# Patient Record
Sex: Male | Born: 1940 | Race: Black or African American | Hispanic: No | Smoking: Never smoker
Health system: Southern US, Community
[De-identification: ages and names within clinical notes are randomized; demographics above are authoritative.]

## PROBLEM LIST (undated history)

## (undated) DIAGNOSIS — I4819 Other persistent atrial fibrillation: Secondary | ICD-10-CM

## (undated) DIAGNOSIS — I5022 Chronic systolic (congestive) heart failure: Secondary | ICD-10-CM

## (undated) DIAGNOSIS — I480 Paroxysmal atrial fibrillation: Secondary | ICD-10-CM

## (undated) DIAGNOSIS — Z9289 Personal history of other medical treatment: Secondary | ICD-10-CM

## (undated) DIAGNOSIS — E079 Disorder of thyroid, unspecified: Secondary | ICD-10-CM

## (undated) DIAGNOSIS — I5031 Acute diastolic (congestive) heart failure: Secondary | ICD-10-CM

## (undated) DIAGNOSIS — I5021 Acute systolic (congestive) heart failure: Secondary | ICD-10-CM

## (undated) DIAGNOSIS — C679 Malignant neoplasm of bladder, unspecified: Secondary | ICD-10-CM

## (undated) DIAGNOSIS — R7989 Other specified abnormal findings of blood chemistry: Secondary | ICD-10-CM

## (undated) DIAGNOSIS — I429 Cardiomyopathy, unspecified: Secondary | ICD-10-CM

## (undated) DIAGNOSIS — I428 Other cardiomyopathies: Secondary | ICD-10-CM

## (undated) DIAGNOSIS — G43909 Migraine, unspecified, not intractable, without status migrainosus: Secondary | ICD-10-CM

## (undated) DIAGNOSIS — E039 Hypothyroidism, unspecified: Secondary | ICD-10-CM

## (undated) HISTORY — DX: Paroxysmal atrial fibrillation: I48.0

## (undated) HISTORY — DX: Acute systolic (congestive) heart failure: I50.21

## (undated) HISTORY — DX: Cardiomyopathy, unspecified: I42.9

## (undated) HISTORY — DX: Other persistent atrial fibrillation: I48.19

## (undated) HISTORY — DX: Other cardiomyopathies: I42.8

## (undated) HISTORY — DX: Acute diastolic (congestive) heart failure: I50.31

## (undated) HISTORY — PX: TONSILLECTOMY: SUR1361

## (undated) HISTORY — DX: Other specified abnormal findings of blood chemistry: R79.89

## (undated) HISTORY — DX: Chronic systolic (congestive) heart failure: I50.22

---

## 2005-04-02 ENCOUNTER — Ambulatory Visit: Payer: Self-pay | Admitting: Oncology

## 2005-04-30 ENCOUNTER — Ambulatory Visit (HOSPITAL_COMMUNITY): Admission: RE | Admit: 2005-04-30 | Discharge: 2005-04-30 | Payer: Self-pay | Admitting: Oncology

## 2005-05-28 ENCOUNTER — Emergency Department (HOSPITAL_COMMUNITY): Admission: EM | Admit: 2005-05-28 | Discharge: 2005-05-28 | Payer: Self-pay | Admitting: Emergency Medicine

## 2005-05-29 ENCOUNTER — Ambulatory Visit: Payer: Self-pay | Admitting: Oncology

## 2005-09-05 ENCOUNTER — Ambulatory Visit: Payer: Self-pay | Admitting: Oncology

## 2005-10-31 ENCOUNTER — Ambulatory Visit: Payer: Self-pay | Admitting: Oncology

## 2005-12-19 ENCOUNTER — Ambulatory Visit: Payer: Self-pay | Admitting: Oncology

## 2005-12-19 LAB — CBC WITH DIFFERENTIAL/PLATELET
BASO%: 0.7 % (ref 0.0–2.0)
Basophils Absolute: 0 10e3/uL (ref 0.0–0.1)
EOS%: 4.6 % (ref 0.0–7.0)
Eosinophils Absolute: 0.3 10e3/uL (ref 0.0–0.5)
HCT: 37.2 % — ABNORMAL LOW (ref 38.7–49.9)
HGB: 13 g/dL (ref 13.0–17.1)
LYMPH%: 29.1 % (ref 14.0–48.0)
MCH: 34.4 pg — ABNORMAL HIGH (ref 28.0–33.4)
MCHC: 35 g/dL (ref 32.0–35.9)
MCV: 98.1 fL — ABNORMAL HIGH (ref 81.6–98.0)
MONO#: 0.9 10e3/uL (ref 0.1–0.9)
MONO%: 14.8 % — ABNORMAL HIGH (ref 0.0–13.0)
NEUT#: 3.1 10e3/uL (ref 1.5–6.5)
NEUT%: 50.8 % (ref 40.0–75.0)
Platelets: 74 10e3/uL — ABNORMAL LOW (ref 145–400)
RBC: 3.79 10e6/uL — ABNORMAL LOW (ref 4.20–5.71)
RDW: 14.5 % (ref 11.2–14.6)
WBC: 6 10e3/uL (ref 4.0–10.0)
lymph#: 1.8 10e3/uL (ref 0.9–3.3)

## 2006-03-20 ENCOUNTER — Ambulatory Visit: Payer: Self-pay | Admitting: Oncology

## 2006-03-21 LAB — CBC WITH DIFFERENTIAL/PLATELET
BASO%: 0.5 % (ref 0.0–2.0)
Basophils Absolute: 0 10*3/uL (ref 0.0–0.1)
Eosinophils Absolute: 0.3 10*3/uL (ref 0.0–0.5)
HCT: 38.3 % — ABNORMAL LOW (ref 38.7–49.9)
HGB: 13.3 g/dL (ref 13.0–17.1)
LYMPH%: 27.9 % (ref 14.0–48.0)
MCH: 34.7 pg — ABNORMAL HIGH (ref 28.0–33.4)
MCHC: 34.7 g/dL (ref 32.0–35.9)
MCV: 100 fL — ABNORMAL HIGH (ref 81.6–98.0)
MONO%: 15.7 % — ABNORMAL HIGH (ref 0.0–13.0)
NEUT#: 2.8 10*3/uL (ref 1.5–6.5)
NEUT%: 49.8 % (ref 40.0–75.0)
Platelets: 77 10*3/uL — ABNORMAL LOW (ref 145–400)
lymph#: 1.6 10*3/uL (ref 0.9–3.3)

## 2006-03-21 LAB — COMPREHENSIVE METABOLIC PANEL
ALT: 41 U/L — ABNORMAL HIGH (ref 0–40)
Albumin: 3.7 g/dL (ref 3.5–5.2)
CO2: 24 mEq/L (ref 19–32)
Calcium: 8.6 mg/dL (ref 8.4–10.5)
Chloride: 109 mEq/L (ref 96–112)
Glucose, Bld: 99 mg/dL (ref 70–99)
Sodium: 142 mEq/L (ref 135–145)
Total Bilirubin: 0.6 mg/dL (ref 0.3–1.2)
Total Protein: 7.3 g/dL (ref 6.0–8.3)

## 2006-04-11 ENCOUNTER — Emergency Department (HOSPITAL_COMMUNITY): Admission: EM | Admit: 2006-04-11 | Discharge: 2006-04-11 | Payer: Self-pay | Admitting: Emergency Medicine

## 2006-07-23 HISTORY — PX: CYSTOSCOPY WITH FULGERATION: SHX6638

## 2006-07-23 HISTORY — PX: TRANSURETHRAL RESECTION OF BLADDER: SUR1395

## 2006-08-05 ENCOUNTER — Ambulatory Visit: Payer: Self-pay | Admitting: Oncology

## 2006-08-11 ENCOUNTER — Emergency Department (HOSPITAL_COMMUNITY): Admission: EM | Admit: 2006-08-11 | Discharge: 2006-08-11 | Payer: Self-pay | Admitting: Emergency Medicine

## 2006-09-17 LAB — CBC WITH DIFFERENTIAL/PLATELET
BASO%: 0.5 % (ref 0.0–2.0)
Basophils Absolute: 0 10*3/uL (ref 0.0–0.1)
EOS%: 3 % (ref 0.0–7.0)
Eosinophils Absolute: 0.2 10*3/uL (ref 0.0–0.5)
HGB: 13.8 g/dL (ref 13.0–17.1)
MCHC: 35.5 g/dL (ref 32.0–35.9)
MCV: 97.2 fL (ref 81.6–98.0)
MONO%: 16.2 % — ABNORMAL HIGH (ref 0.0–13.0)
NEUT#: 3.5 10*3/uL (ref 1.5–6.5)
RBC: 4.01 10*6/uL — ABNORMAL LOW (ref 4.20–5.71)
RDW: 13.8 % (ref 11.2–14.6)
lymph#: 2.1 10*3/uL (ref 0.9–3.3)

## 2006-09-17 LAB — COMPREHENSIVE METABOLIC PANEL
AST: 102 U/L — ABNORMAL HIGH (ref 0–37)
Albumin: 4 g/dL (ref 3.5–5.2)
Alkaline Phosphatase: 106 U/L (ref 39–117)
BUN: 26 mg/dL — ABNORMAL HIGH (ref 6–23)
Calcium: 8.4 mg/dL (ref 8.4–10.5)
Chloride: 109 mEq/L (ref 96–112)
Glucose, Bld: 103 mg/dL — ABNORMAL HIGH (ref 70–99)
Potassium: 4.1 mEq/L (ref 3.5–5.3)
Sodium: 141 mEq/L (ref 135–145)
Total Protein: 7.5 g/dL (ref 6.0–8.3)

## 2006-09-18 ENCOUNTER — Encounter (HOSPITAL_COMMUNITY): Admission: RE | Admit: 2006-09-18 | Discharge: 2006-11-21 | Payer: Self-pay | Admitting: Oncology

## 2006-09-18 LAB — ABO AND RH: Rh Type: POSITIVE

## 2006-09-23 ENCOUNTER — Encounter (INDEPENDENT_AMBULATORY_CARE_PROVIDER_SITE_OTHER): Payer: Self-pay | Admitting: Specialist

## 2006-09-23 ENCOUNTER — Ambulatory Visit (HOSPITAL_COMMUNITY): Admission: RE | Admit: 2006-09-23 | Discharge: 2006-09-24 | Payer: Self-pay | Admitting: Urology

## 2006-09-29 ENCOUNTER — Emergency Department (HOSPITAL_COMMUNITY): Admission: EM | Admit: 2006-09-29 | Discharge: 2006-09-29 | Payer: Self-pay | Admitting: Emergency Medicine

## 2006-09-30 ENCOUNTER — Emergency Department (HOSPITAL_COMMUNITY): Admission: EM | Admit: 2006-09-30 | Discharge: 2006-09-30 | Payer: Self-pay | Admitting: Emergency Medicine

## 2006-10-01 ENCOUNTER — Ambulatory Visit: Payer: Self-pay | Admitting: Oncology

## 2006-10-01 ENCOUNTER — Inpatient Hospital Stay (HOSPITAL_COMMUNITY): Admission: AD | Admit: 2006-10-01 | Discharge: 2006-10-04 | Payer: Self-pay | Admitting: Urology

## 2006-10-01 LAB — CBC WITH DIFFERENTIAL/PLATELET
Basophils Absolute: 0 10*3/uL (ref 0.0–0.1)
EOS%: 0.7 % (ref 0.0–7.0)
Eosinophils Absolute: 0 10*3/uL (ref 0.0–0.5)
HGB: 11.1 g/dL — ABNORMAL LOW (ref 13.0–17.1)
LYMPH%: 17.6 % (ref 14.0–48.0)
MCH: 34.8 pg — ABNORMAL HIGH (ref 28.0–33.4)
MCV: 96.6 fL (ref 81.6–98.0)
MONO%: 14.6 % — ABNORMAL HIGH (ref 0.0–13.0)
NEUT#: 3.6 10*3/uL (ref 1.5–6.5)
Platelets: 70 10*3/uL — ABNORMAL LOW (ref 145–400)
RDW: 13.5 % (ref 11.2–14.6)
lymph#: 1 10*3/uL (ref 0.9–3.3)

## 2006-10-02 ENCOUNTER — Encounter (INDEPENDENT_AMBULATORY_CARE_PROVIDER_SITE_OTHER): Payer: Self-pay | Admitting: Specialist

## 2006-10-22 LAB — CBC WITH DIFFERENTIAL/PLATELET
BASO%: 0.6 % (ref 0.0–2.0)
EOS%: 6.4 % (ref 0.0–7.0)
Eosinophils Absolute: 0.3 10*3/uL (ref 0.0–0.5)
HCT: 34.9 % — ABNORMAL LOW (ref 38.7–49.9)
LYMPH%: 32.5 % (ref 14.0–48.0)
MCH: 34.6 pg — ABNORMAL HIGH (ref 28.0–33.4)
MCHC: 35.4 g/dL (ref 32.0–35.9)
MCV: 97.6 fL (ref 81.6–98.0)
MONO%: 16.1 % — ABNORMAL HIGH (ref 0.0–13.0)
NEUT#: 2 10*3/uL (ref 1.5–6.5)
NEUT%: 44.4 % (ref 40.0–75.0)
Platelets: 85 10*3/uL — ABNORMAL LOW (ref 145–400)
RBC: 3.58 10*6/uL — ABNORMAL LOW (ref 4.20–5.71)
RDW: 12.1 % (ref 11.2–14.6)

## 2006-10-22 LAB — COMPREHENSIVE METABOLIC PANEL
ALT: 37 U/L (ref 0–53)
AST: 37 U/L (ref 0–37)
Alkaline Phosphatase: 108 U/L (ref 39–117)
Glucose, Bld: 94 mg/dL (ref 70–99)
Potassium: 4 mEq/L (ref 3.5–5.3)
Sodium: 142 mEq/L (ref 135–145)
Total Bilirubin: 0.4 mg/dL (ref 0.3–1.2)
Total Protein: 7.1 g/dL (ref 6.0–8.3)

## 2007-01-07 ENCOUNTER — Ambulatory Visit: Payer: Self-pay | Admitting: Oncology

## 2007-01-09 LAB — CBC WITH DIFFERENTIAL/PLATELET
BASO%: 0.5 % (ref 0.0–2.0)
EOS%: 2.8 % (ref 0.0–7.0)
Eosinophils Absolute: 0.1 10*3/uL (ref 0.0–0.5)
LYMPH%: 35.9 % (ref 14.0–48.0)
MCH: 34.2 pg — ABNORMAL HIGH (ref 28.0–33.4)
MCHC: 36.3 g/dL — ABNORMAL HIGH (ref 32.0–35.9)
MCV: 94.3 fL (ref 81.6–98.0)
MONO%: 15.3 % — ABNORMAL HIGH (ref 0.0–13.0)
NEUT#: 2.4 10*3/uL (ref 1.5–6.5)
Platelets: 66 10*3/uL — ABNORMAL LOW (ref 145–400)
RBC: 3.96 10*6/uL — ABNORMAL LOW (ref 4.20–5.71)
lymph#: 1.9 10*3/uL (ref 0.9–3.3)

## 2007-01-09 LAB — COMPREHENSIVE METABOLIC PANEL
Albumin: 4.1 g/dL (ref 3.5–5.2)
Alkaline Phosphatase: 107 U/L (ref 39–117)
BUN: 17 mg/dL (ref 6–23)
Calcium: 8.6 mg/dL (ref 8.4–10.5)
Glucose, Bld: 85 mg/dL (ref 70–99)
Sodium: 141 mEq/L (ref 135–145)
Total Bilirubin: 1 mg/dL (ref 0.3–1.2)
Total Protein: 7.4 g/dL (ref 6.0–8.3)

## 2007-02-04 ENCOUNTER — Encounter (HOSPITAL_COMMUNITY): Admission: RE | Admit: 2007-02-04 | Discharge: 2007-04-21 | Payer: Self-pay | Admitting: Oncology

## 2007-02-05 ENCOUNTER — Ambulatory Visit (HOSPITAL_COMMUNITY): Admission: RE | Admit: 2007-02-05 | Discharge: 2007-02-06 | Payer: Self-pay | Admitting: Urology

## 2007-02-05 ENCOUNTER — Encounter (INDEPENDENT_AMBULATORY_CARE_PROVIDER_SITE_OTHER): Payer: Self-pay | Admitting: Urology

## 2007-04-01 ENCOUNTER — Ambulatory Visit: Payer: Self-pay | Admitting: Oncology

## 2007-04-17 LAB — COMPREHENSIVE METABOLIC PANEL
AST: 110 U/L — ABNORMAL HIGH (ref 0–37)
Albumin: 4 g/dL (ref 3.5–5.2)
Alkaline Phosphatase: 96 U/L (ref 39–117)
BUN: 22 mg/dL (ref 6–23)
CO2: 23 mEq/L (ref 19–32)
Calcium: 8.5 mg/dL (ref 8.4–10.5)
Creatinine, Ser: 0.85 mg/dL (ref 0.40–1.50)
Potassium: 3.9 mEq/L (ref 3.5–5.3)
Sodium: 142 mEq/L (ref 135–145)
Total Bilirubin: 1.1 mg/dL (ref 0.3–1.2)

## 2007-04-17 LAB — CBC WITH DIFFERENTIAL/PLATELET
Basophils Absolute: 0 10*3/uL (ref 0.0–0.1)
EOS%: 2.3 % (ref 0.0–7.0)
Eosinophils Absolute: 0.1 10*3/uL (ref 0.0–0.5)
HGB: 13.1 g/dL (ref 13.0–17.1)
LYMPH%: 32.7 % (ref 14.0–48.0)
MCH: 35.5 pg — ABNORMAL HIGH (ref 28.0–33.4)
MCV: 99.3 fL — ABNORMAL HIGH (ref 81.6–98.0)
MONO#: 0.9 10*3/uL (ref 0.1–0.9)
MONO%: 17.4 % — ABNORMAL HIGH (ref 0.0–13.0)
NEUT#: 2.4 10*3/uL (ref 1.5–6.5)
NEUT%: 47 % (ref 40.0–75.0)
Platelets: 56 10*3/uL — ABNORMAL LOW (ref 145–400)
RBC: 3.7 10*6/uL — ABNORMAL LOW (ref 4.20–5.71)
RDW: 13.9 % (ref 11.2–14.6)

## 2007-06-16 ENCOUNTER — Encounter (INDEPENDENT_AMBULATORY_CARE_PROVIDER_SITE_OTHER): Payer: Self-pay | Admitting: *Deleted

## 2007-06-16 ENCOUNTER — Ambulatory Visit (HOSPITAL_COMMUNITY): Admission: RE | Admit: 2007-06-16 | Discharge: 2007-06-16 | Payer: Self-pay | Admitting: *Deleted

## 2007-06-20 ENCOUNTER — Inpatient Hospital Stay (HOSPITAL_COMMUNITY): Admission: EM | Admit: 2007-06-20 | Discharge: 2007-06-24 | Payer: Self-pay | Admitting: Emergency Medicine

## 2007-06-25 ENCOUNTER — Emergency Department (HOSPITAL_COMMUNITY): Admission: EM | Admit: 2007-06-25 | Discharge: 2007-06-25 | Payer: Self-pay | Admitting: Emergency Medicine

## 2007-06-25 ENCOUNTER — Ambulatory Visit: Payer: Self-pay | Admitting: Internal Medicine

## 2007-08-11 ENCOUNTER — Ambulatory Visit: Payer: Self-pay | Admitting: Oncology

## 2007-08-13 LAB — COMPREHENSIVE METABOLIC PANEL
AST: 87 U/L — ABNORMAL HIGH (ref 0–37)
Albumin: 3.8 g/dL (ref 3.5–5.2)
Alkaline Phosphatase: 100 U/L (ref 39–117)
BUN: 17 mg/dL (ref 6–23)
Creatinine, Ser: 0.92 mg/dL (ref 0.40–1.50)
Glucose, Bld: 94 mg/dL (ref 70–99)
Potassium: 3.7 mEq/L (ref 3.5–5.3)
Total Bilirubin: 0.9 mg/dL (ref 0.3–1.2)

## 2007-08-13 LAB — CBC WITH DIFFERENTIAL/PLATELET
Basophils Absolute: 0 10*3/uL (ref 0.0–0.1)
EOS%: 3 % (ref 0.0–7.0)
Eosinophils Absolute: 0.2 10*3/uL (ref 0.0–0.5)
HCT: 37.1 % — ABNORMAL LOW (ref 38.7–49.9)
HGB: 13.1 g/dL (ref 13.0–17.1)
LYMPH%: 30.1 % (ref 14.0–48.0)
MCH: 34.2 pg — ABNORMAL HIGH (ref 28.0–33.4)
MCV: 96.7 fL (ref 81.6–98.0)
MONO%: 14.6 % — ABNORMAL HIGH (ref 0.0–13.0)
NEUT#: 2.6 10*3/uL (ref 1.5–6.5)
NEUT%: 51.9 % (ref 40.0–75.0)
Platelets: 67 10*3/uL — ABNORMAL LOW (ref 145–400)
RDW: 15 % — ABNORMAL HIGH (ref 11.2–14.6)
lymph#: 1.5 10*3/uL (ref 0.9–3.3)

## 2007-12-02 ENCOUNTER — Ambulatory Visit: Payer: Self-pay | Admitting: Oncology

## 2007-12-08 ENCOUNTER — Encounter: Admission: RE | Admit: 2007-12-08 | Discharge: 2007-12-08 | Payer: Self-pay | Admitting: Otolaryngology

## 2008-04-30 ENCOUNTER — Ambulatory Visit: Payer: Self-pay | Admitting: Oncology

## 2008-05-04 LAB — CBC WITH DIFFERENTIAL/PLATELET
Basophils Absolute: 0 10*3/uL (ref 0.0–0.1)
EOS%: 6.8 % (ref 0.0–7.0)
Eosinophils Absolute: 0.3 10*3/uL (ref 0.0–0.5)
HCT: 39.9 % (ref 38.7–49.9)
HGB: 14.2 g/dL (ref 13.0–17.1)
LYMPH%: 29.5 % (ref 14.0–48.0)
MCH: 33.7 pg — ABNORMAL HIGH (ref 28.0–33.4)
MCV: 95 fL (ref 81.6–98.0)
MONO%: 19.4 % — ABNORMAL HIGH (ref 0.0–13.0)
NEUT#: 1.7 10*3/uL (ref 1.5–6.5)
Platelets: 67 10*3/uL — ABNORMAL LOW (ref 145–400)
RBC: 4.2 10*6/uL (ref 4.20–5.71)

## 2008-05-04 LAB — COMPREHENSIVE METABOLIC PANEL
ALT: 39 U/L (ref 0–53)
AST: 51 U/L — ABNORMAL HIGH (ref 0–37)
Alkaline Phosphatase: 121 U/L — ABNORMAL HIGH (ref 39–117)
BUN: 15 mg/dL (ref 6–23)
CO2: 20 mEq/L (ref 19–32)
Glucose, Bld: 103 mg/dL — ABNORMAL HIGH (ref 70–99)
Sodium: 140 mEq/L (ref 135–145)
Total Bilirubin: 0.6 mg/dL (ref 0.3–1.2)
Total Protein: 7.9 g/dL (ref 6.0–8.3)

## 2008-09-27 ENCOUNTER — Ambulatory Visit: Payer: Self-pay | Admitting: Oncology

## 2008-09-29 LAB — COMPREHENSIVE METABOLIC PANEL
ALT: 36 U/L (ref 0–53)
Albumin: 3.6 g/dL (ref 3.5–5.2)
BUN: 11 mg/dL (ref 6–23)
CO2: 25 mEq/L (ref 19–32)
Chloride: 111 mEq/L (ref 96–112)
Creatinine, Ser: 0.95 mg/dL (ref 0.40–1.50)
Glucose, Bld: 99 mg/dL (ref 70–99)
Potassium: 3.8 mEq/L (ref 3.5–5.3)
Sodium: 142 mEq/L (ref 135–145)
Total Bilirubin: 0.8 mg/dL (ref 0.3–1.2)
Total Protein: 7.4 g/dL (ref 6.0–8.3)

## 2008-09-29 LAB — CBC WITH DIFFERENTIAL/PLATELET
BASO%: 0.4 % (ref 0.0–2.0)
Eosinophils Absolute: 0.3 10*3/uL (ref 0.0–0.5)
HCT: 42.3 % (ref 38.4–49.9)
LYMPH%: 33 % (ref 14.0–49.0)
MCH: 33.2 pg (ref 27.2–33.4)
MCHC: 35 g/dL (ref 32.0–36.0)
MONO#: 0.7 10*3/uL (ref 0.1–0.9)
MONO%: 14 % (ref 0.0–14.0)
NEUT#: 2.5 10*3/uL (ref 1.5–6.5)
Platelets: 86 10*3/uL — ABNORMAL LOW (ref 140–400)
RBC: 4.46 10*6/uL (ref 4.20–5.82)
RDW: 14.3 % (ref 11.0–14.6)
WBC: 5.3 10*3/uL (ref 4.0–10.3)
lymph#: 1.7 10*3/uL (ref 0.9–3.3)

## 2008-10-01 LAB — SPEP & IFE WITH QIG
Alpha-1-Globulin: 3.1 % (ref 2.9–4.9)
Alpha-2-Globulin: 7.9 % (ref 7.1–11.8)
Beta 2: 6 % (ref 3.2–6.5)
Beta Globulin: 6.5 % (ref 4.7–7.2)
IgA: 561 mg/dL — ABNORMAL HIGH (ref 68–378)
IgG (Immunoglobin G), Serum: 1660 mg/dL — ABNORMAL HIGH (ref 694–1618)
Total Protein, Serum Electrophoresis: 7.4 g/dL (ref 6.0–8.3)

## 2009-02-25 ENCOUNTER — Ambulatory Visit: Payer: Self-pay | Admitting: Oncology

## 2009-06-22 ENCOUNTER — Inpatient Hospital Stay (HOSPITAL_COMMUNITY): Admission: EM | Admit: 2009-06-22 | Discharge: 2009-06-25 | Payer: Self-pay | Admitting: Emergency Medicine

## 2009-07-04 ENCOUNTER — Ambulatory Visit: Payer: Self-pay | Admitting: Oncology

## 2009-07-06 LAB — CBC WITH DIFFERENTIAL/PLATELET
BASO%: 0.4 % (ref 0.0–2.0)
Basophils Absolute: 0 10*3/uL (ref 0.0–0.1)
EOS%: 2.3 % (ref 0.0–7.0)
MCH: 32.3 pg (ref 27.2–33.4)
MCHC: 34.5 g/dL (ref 32.0–36.0)
MCV: 93.5 fL (ref 79.3–98.0)
MONO%: 10.4 % (ref 0.0–14.0)
RBC: 4.43 10*6/uL (ref 4.20–5.82)
RDW: 13.7 % (ref 11.0–14.6)

## 2010-01-02 ENCOUNTER — Ambulatory Visit: Payer: Self-pay | Admitting: Oncology

## 2010-02-20 ENCOUNTER — Ambulatory Visit: Payer: Self-pay | Admitting: Oncology

## 2010-10-24 LAB — COMPREHENSIVE METABOLIC PANEL
ALT: 30 U/L (ref 0–53)
ALT: 31 U/L (ref 0–53)
AST: 40 U/L — ABNORMAL HIGH (ref 0–37)
AST: 43 U/L — ABNORMAL HIGH (ref 0–37)
Albumin: 3.7 g/dL (ref 3.5–5.2)
Alkaline Phosphatase: 82 U/L (ref 39–117)
BUN: 12 mg/dL (ref 6–23)
CO2: 22 mEq/L (ref 19–32)
CO2: 25 mEq/L (ref 19–32)
Calcium: 8.5 mg/dL (ref 8.4–10.5)
Chloride: 101 mEq/L (ref 96–112)
Chloride: 104 mEq/L (ref 96–112)
Creatinine, Ser: 0.76 mg/dL (ref 0.4–1.5)
Creatinine, Ser: 0.79 mg/dL (ref 0.4–1.5)
GFR calc Af Amer: >60 mL/min (ref >60)
GFR calc Af Amer: >60 mL/min (ref >60)
GFR calc non Af Amer: >60 mL/min (ref >60)
GFR calc non Af Amer: >60 mL/min (ref >60)
Glucose, Bld: 104 mg/dL — ABNORMAL HIGH (ref 70–99)
Glucose, Bld: 113 mg/dL — ABNORMAL HIGH (ref 70–99)
Potassium: 3.4 mEq/L — ABNORMAL LOW (ref 3.5–5.1)
Sodium: 132 mEq/L — ABNORMAL LOW (ref 135–145)
Sodium: 134 mEq/L — ABNORMAL LOW (ref 135–145)
Total Bilirubin: 1 mg/dL (ref 0.3–1.2)
Total Bilirubin: 1.2 mg/dL (ref 0.3–1.2)
Total Protein: 7.7 g/dL (ref 6.0–8.3)

## 2010-10-24 LAB — DIFFERENTIAL
Basophils Absolute: 0 10*3/uL (ref 0.0–0.1)
Basophils Relative: 1 % (ref 0–1)
Eosinophils Absolute: 0 10*3/uL (ref 0.0–0.7)
Eosinophils Relative: 0 % (ref 0–5)
Lymphocytes Relative: 7 % — ABNORMAL LOW (ref 12–46)
Lymphs Abs: 0.7 10*3/uL (ref 0.7–4.0)
Monocytes Absolute: 1.6 10*3/uL — ABNORMAL HIGH (ref 0.1–1.0)
Monocytes Relative: 16 % — ABNORMAL HIGH (ref 3–12)
Neutro Abs: 7.8 10*3/uL — ABNORMAL HIGH (ref 1.7–7.7)
Neutrophils Relative %: 77 % (ref 43–77)

## 2010-10-24 LAB — DRUGS OF ABUSE SCREEN W/O ALC, ROUTINE URINE
Amphetamine Screen, Ur: NEGATIVE
Barbiturate Quant, Ur: NEGATIVE
Benzodiazepines.: NEGATIVE
Cocaine Metabolites: NEGATIVE
Creatinine,U: 87.7 mg/dL

## 2010-10-24 LAB — CBC
HCT: 38.6 % — ABNORMAL LOW (ref 39.0–52.0)
HCT: 41.5 % (ref 39.0–52.0)
HCT: 43.5 % (ref 39.0–52.0)
Hemoglobin: 14.9 g/dL (ref 13.0–17.0)
Hemoglobin: 15.2 g/dL (ref 13.0–17.0)
MCHC: 34.8 g/dL (ref 30.0–36.0)
MCHC: 34.9 g/dL (ref 30.0–36.0)
MCHC: 35.1 g/dL (ref 30.0–36.0)
MCV: 95.1 fL (ref 78.0–100.0)
MCV: 95.3 fL (ref 78.0–100.0)
MCV: 95.4 fL (ref 78.0–100.0)
Platelets: 76 10*3/uL — ABNORMAL LOW (ref 150–400)
Platelets: 77 10*3/uL — ABNORMAL LOW (ref 150–400)
Platelets: 84 10*3/uL — ABNORMAL LOW (ref 150–400)
Platelets: 84 10*3/uL — ABNORMAL LOW (ref 150–400)
RBC: 4.49 MIL/uL (ref 4.22–5.81)
RBC: 4.57 MIL/uL (ref 4.22–5.81)
RDW: 13.6 % (ref 11.5–15.5)
RDW: 13.9 % (ref 11.5–15.5)
WBC: 10.1 10*3/uL (ref 4.0–10.5)
WBC: 11.4 10*3/uL — ABNORMAL HIGH (ref 4.0–10.5)
WBC: 11.6 10*3/uL — ABNORMAL HIGH (ref 4.0–10.5)

## 2010-10-24 LAB — CULTURE, BLOOD (ROUTINE X 2)
Culture: NO GROWTH
Culture: NO GROWTH

## 2010-10-24 LAB — URINALYSIS, ROUTINE W REFLEX MICROSCOPIC
Bilirubin Urine: NEGATIVE
Glucose, UA: NEGATIVE mg/dL
Leukocytes, UA: NEGATIVE
Nitrite: NEGATIVE
Protein, ur: NEGATIVE mg/dL
Specific Gravity, Urine: 1.013 (ref 1.005–1.030)
Urobilinogen, UA: 0.2 mg/dL (ref 0.0–1.0)
pH: 6 (ref 5.0–8.0)

## 2010-10-24 LAB — GLUCOSE, CAPILLARY
Glucose-Capillary: 103 mg/dL — ABNORMAL HIGH (ref 70–99)
Glucose-Capillary: 133 mg/dL — ABNORMAL HIGH (ref 70–99)
Glucose-Capillary: 94 mg/dL (ref 70–99)
Glucose-Capillary: 97 mg/dL (ref 70–99)

## 2010-10-24 LAB — URINE MICROSCOPIC-ADD ON

## 2010-10-24 LAB — PROTIME-INR
INR: 1.2 (ref 0.00–1.49)
Prothrombin Time: 15.1 seconds (ref 11.6–15.2)

## 2010-10-24 LAB — URINE CULTURE
Colony Count: NO GROWTH
Culture: NO GROWTH

## 2010-10-24 LAB — PROTEIN AND GLUCOSE, CSF
Glucose, CSF: 82 mg/dL — ABNORMAL HIGH (ref 43–76)
Total  Protein, CSF: 37 mg/dL (ref 15–45)

## 2010-10-24 LAB — LIPID PANEL
Cholesterol: 155 mg/dL (ref 0–200)
HDL: 34 mg/dL — ABNORMAL LOW (ref >39)
LDL Cholesterol: 109 mg/dL — ABNORMAL HIGH (ref 0–99)
Triglycerides: 60 mg/dL (ref <150)

## 2010-10-24 LAB — HSV PCR
HSV 2 , PCR: NOT DETECTED
Specimen Source-HSVPCR: NO GROWTH

## 2010-10-24 LAB — HEMOGLOBIN A1C
Hgb A1c MFr Bld: 5.7 % (ref 4.6–6.1)
Mean Plasma Glucose: 117 mg/dL

## 2010-10-24 LAB — APTT: aPTT: 30 seconds (ref 24–37)

## 2010-10-24 LAB — BASIC METABOLIC PANEL
BUN: 11 mg/dL (ref 6–23)
BUN: 7 mg/dL (ref 6–23)
CO2: 23 mEq/L (ref 19–32)
Chloride: 102 mEq/L (ref 96–112)
GFR calc non Af Amer: >60 mL/min (ref >60)
Glucose, Bld: 153 mg/dL — ABNORMAL HIGH (ref 70–99)
Potassium: 3.5 mEq/L (ref 3.5–5.1)
Potassium: 3.8 mEq/L (ref 3.5–5.1)

## 2010-10-24 LAB — TROPONIN I: Troponin I: 0.06 ng/mL (ref 0.00–0.06)

## 2010-10-24 LAB — CSF CELL COUNT WITH DIFFERENTIAL

## 2010-10-24 LAB — CSF CULTURE W GRAM STAIN

## 2010-10-24 LAB — CK TOTAL AND CKMB (NOT AT ARMC)
CK, MB: 3 ng/mL (ref 0.3–4.0)
Relative Index: 0.6 (ref 0.0–2.5)
Total CK: 518 U/L — ABNORMAL HIGH (ref 7–232)

## 2010-10-24 LAB — VDRL, CSF: VDRL Quant, CSF: NONREACTIVE

## 2010-10-24 LAB — LACTIC ACID, PLASMA: Lactic Acid, Venous: 1.5 mmol/L (ref 0.5–2.2)

## 2010-12-05 NOTE — Discharge Summary (Signed)
NAME:  ZHANE, BLUITT NO.:  [id number]   MEDICAL RECORD NO.:  [id number]          PATIENT TYPE:  INP   LOCATION:  1532                         FACILITY:  [facility]   PHYSICIAN:  Georgiana Spinner, M.D.    DATE OF BIRTH:  1941-04-20   DATE OF ADMISSION:  06/19/2007  DATE OF DISCHARGE:  06/24/2007                               DISCHARGE SUMMARY   Mr. Glomski is a 70 year old gentleman who presented with lower GI  bleeding, was transfused 2 units of blood on admission because of a  hemoglobin of 8.9, down  to 7.  He was transfused 2 units of blood on  the November 27 and 28 and was seen by me on November 28 and it appeared  that it had stabilized, his hemoglobin coming up to 9 and his stools  turning brown; however, subsequently he continued to pass blood per  rectum requiring further transfusion on Sunday, November 30, he  underwent a colonoscopy by Dr. Leone Payor who injected him and put clips on  a bleeding blood vessel in the cecum.  He has past medical history of  thrombocytopenia.  In fact, he has had problems with bleeding from his  bladder after a cystoscopy earlier this year.  Because of the recurrent  bleeding noted early Monday morning, he was given further blood  transfusions and given platelet packs.  With the latter, his hemoglobin  came up subsequently to 11 and his platelet count came up to 101,000.  Over the last 2 days of his he had 1 brown bowel movement.  It was dark  brown in color but no blood was seen.  Throughout his hospital stay, his  vital signs remained stable with blood pressure ranging approximately  114/71 at discharge with a pulse of 71, respiratory rate 20, temperature  98 and room air oxygen saturation 95%.  The patient was well without  abdominal pain, he was tolerating a low-residue diet and he was  discharged home in a stable condition with outpatient followup. During  this hospitalization, he received both packed cells and platelet packs.  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           ______________________________  Georgiana Spinner, M.D.     GMO/MEDQ  D:  06/24/2007  T:  06/24/2007  Job:  [id number]   cc:   Blenda Nicely. [employer]

## 2010-12-05 NOTE — Op Note (Signed)
NAME:  MONTFORD, BARG NO.:  [id number]   MEDICAL RECORD NO.:  [id number]          PATIENT TYPE:  OIB   LOCATION:  1424                         FACILITY:  [facility]   PHYSICIAN:  Valetta Fuller, M.D.  DATE OF BIRTH:  Dec 31, 1940   DATE OF PROCEDURE:  02/05/2007  DATE OF DISCHARGE:  02/06/2007                               OPERATIVE REPORT   PREOPERATIVE DIAGNOSIS:  History of transitional cell carcinoma bladder.   POSTOPERATIVE DIAGNOSIS:  History of transitional cell carcinoma  bladder.   PROCEDURE PERFORMED:  Cystoscopy with biopsy and fulguration.   INDICATIONS:  Mr. Orourke is a 70 year old male who has a history of  transitional cell carcinoma of the bladder.  He had presented in  February of this year with total gross painless hematuria and did have a  history of idiopathic thrombocytopenia.  The patient had a 2-3 cm tumor  involving his left hemi trigone.  The patient subsequently underwent  transurethral resection of that tumor but then developed urinary  retention and some ongoing gross hematuria.  He required a second  procedure to fulgurate these areas and also had a transurethral incision  of his prostate.  When I last saw him a few weeks ago he was doing quite  well.  Cystoscopy showed no evidence of obvious recurrent tumor, but he  did have a couple of areas of increased erythema on his bladder wall.  We could not rule out carcinoma in situ.  A cytology was sent which did  not show malignancy but did show some atypia therefore we felt biopsies  were necessary to rule out carcinoma in situ or the start of early  papillary tumor.  The patient subsequently got a platelet transfusion  and his platelets are around 75,000 at this point.  Full informed  consent has been obtained.   TECHNIQUE AND FINDINGS:  The patient was brought to the operating room  where he had successful induction of general anesthesia.  He was placed  in lithotomy position, prepped,  draped in usual manner.  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  His bladder neck was  fairly wide open and he did seem to have a reasonably good result with  his TUIP.  There were a couple areas of left hemi trigone that showed  increased erythema with slight heaping up of the mucosa.  Three separate  areas were biopsied with cold cup and they were then fulgurated.  Hemostasis was excellent.  The patient appeared to tolerate the  procedure well, there were no obvious complications.  He was brought to  recovery room in stable condition.           ______________________________  Valetta Fuller, M.D.  Electronically Signed     DSG/MEDQ  D:  02/05/2007  T:  02/06/2007  Job:  [id number]   cc:   Blenda Nicely. [employer]

## 2010-12-05 NOTE — Op Note (Signed)
NAME:  Lucas Fuller, Lucas Fuller NO.:  [id number]   MEDICAL RECORD NO.:  [id number]          PATIENT TYPE:  AMB   LOCATION:  ENDO                         FACILITY:  [facility]   PHYSICIAN:  Georgiana Spinner, M.D.    DATE OF BIRTH:  06/21/1941   DATE OF PROCEDURE:  DATE OF DISCHARGE:                               OPERATIVE REPORT   PROCEDURE:  Colonoscopy with biopsy.   INDICATIONS:  Colon polyps/colon cancer screening.   ANESTHESIA:  Fentanyl 75 mcg, Versed 8 mg.   DESCRIPTION OF PROCEDURE:  With the patient mildly sedated in the left  lateral decubitus position, a rectal examination was performed which was  unremarkable.  The prostate was not palpable on my exam.  Subsequently,  the Pentax videoscopic colonoscope was inserted into the rectum, passed  under direct vision to the cecum identified by ileocecal valve and  appendiceal orifice.  There was a small polyp in the cecum, all of this  was photographed, however, the photographic equipment was not  functioning due to a server error. The polyp in the cecum was removed  using hot biopsy forceps technique setting of 20/150 blended current.  From this point, the colonoscope was slowly withdrawn taking  circumferential views of the colonic mucosa stopping to wash and suction  as we went thickish yellowish fluid. Some small lesions could be missed  as we withdrew all the way to the rectum which appeared normal on direct  and showed hemorrhoids on retroflexed view. The endoscope was  straightened and withdrawn.  The patient's vital signs and pulse  oximeter remained stable.  The patient tolerated the procedure well  without apparent complication.   FINDINGS:  Polyp of cecum. Internal hemorrhoids otherwise an  unremarkable exam.   PLAN:  Await biopsy report.  The patient will call me for results and  follow-up with me as needed as an outpatient.           ______________________________  Georgiana Spinner, M.D.     GMO/MEDQ  D:   06/16/2007  T:  06/16/2007  Job:  [id number]

## 2010-12-05 NOTE — H&P (Signed)
NAME:  Lucas Fuller, Lucas Fuller NO.:  [id number]   MEDICAL RECORD NO.:  [id number]          PATIENT TYPE:  OBV   LOCATION:  0107                         FACILITY:  [facility]   PHYSICIAN:  Lucas Boop, MD,FACGDATE OF BIRTH:  07-Feb-1941   DATE OF ADMISSION:  06/19/2007  DATE OF DISCHARGE:                              HISTORY & PHYSICAL   CHIEF COMPLAINT:  Rectal bleeding.   HISTORY:  Lucas Fuller is a pleasant 70 year old African American male  known to Dr. Virginia Rochester who had undergone colonoscopy on June 16, 2007, for  screening.  He was found to have 1 cecal polyp which was removed with  hot biopsy forceps.  Path on this polyp is consistent with a tubular  adenoma.  The patient did well post procedure, but presents at this time  with onset of rectal bleeding, which started yesterday morning, with  dark blood mixed with his bowel movement.  The patient relates that he  had 3 episodes of rectal bleeding yesterday.  No associated pain,  cramping, nausea, vomiting, etc.  He says he has felt a bit weak, but  denies any dizziness, shortness of breath, chest pain, cough, etc.  Today, he has had 2 further episodes, both with dark red blood.  He  denies any aspirin or NSAID use.  He does have history of idiopathic  thrombocytopenia documented over the past 2 years and platelet  transfusions are required prior to his surgical procedures on his  bladder.  At this time, he is seen and evaluated in the emergency room  and found to be hemodynamically stable and admitted to the hospital for  observation with acute post polypectomy bleed.   CURRENT MEDICATIONS:  None on a regular basis.   ALLERGIES:  NO KNOWN DRUG ALLERGIES.   PAST HISTORY:  1. Pertinent for idiopathic thrombocytopenia with a baseline platelet      count of around 60,000.  2. History of transitional cell CA of bladder, status post TUR in      March 2008.   FAMILY HISTORY:  Negative for GI disease.   SOCIAL HISTORY:   The patient is single, lives alone.  He is employed  with [employer] and has been there for 30 years.  He is a nonsmoker,  nondrinker.   REVIEW OF SYSTEMS:  CARDIOVASCULAR:  Negative for chest pain or anginal  symptoms.  PULMONARY:  Negative for cough, shortness of breath or sputum  production.  GENITOURINARY:  He says occasionally he has a little bit of  dribbling, but otherwise no current symptoms.  MUSCULOSKELETAL:  Negative.  PSYCH:  Negative.  All other review of systems negative.   PHYSICAL EXAMINATION:  GENERAL:  Well-developed, thin African American  male in no acute distress, sitting in a chair.  VITAL SIGNS:  Temp is 96.7, blood pressure 117/60, pulse is 100,  respirations 99 on room air.  HEENT:  Nontraumatic, normocephalic.  EOMI, PERRLA.  Sclerae anicteric.  NECK:  Supple.  CARDIOVASCULAR:  Regular rate and rhythm with S1 and S2, slightly tachy.  PULMONARY:  Clear to A and P.  ABDOMEN:  Soft.  Bowel sounds are active.  He is nontender.  There is no  palpable mass or hepatosplenomegaly.  RECTAL:  Exam revealed dark red blood on the examining glove.  EXTREMITIES:  No clubbing, cyanosis or edema.  NEURO:  Grossly nonfocal.   Laboratory studies are pending at this time.   IMPRESSION:  68. A 70 year old male with acute lower gastrointestinal bleed      consistent with post polypectomy hemorrhage, status post      colonoscopy and cecal polypectomy June 16, 2007.  2. History of idiopathic thrombocytopenia with baseline platelet count      of approximately 60,000.  3. History of transitional cell carcinoma of the bladder, status post      TUR March 2008.   PLAN:  The patient is admitted to the service of Dr. Stan Fuller.  He  will be kept on a clear liquid diet.  We will purge his bowel with a  MiraLax prep in the event that repeat colonoscopy for control of  bleeding as indicated.  We will check serial H&H's and platelet counts  and transfuse as indicated.  For details,  please see the orders.      Lucas Esterwood, PA-C      Lucas Boop, MD,FACG  Electronically Signed    AE/MEDQ  D:  06/19/2007  T:  06/19/2007  Job:  [id number]   cc:   Lucas Fuller, M.D.  Fax: [phone]

## 2011-02-08 ENCOUNTER — Other Ambulatory Visit: Payer: Self-pay | Admitting: Internal Medicine

## 2011-02-08 DIAGNOSIS — R945 Abnormal results of liver function studies: Secondary | ICD-10-CM

## 2011-02-15 ENCOUNTER — Other Ambulatory Visit: Payer: Self-pay | Admitting: Internal Medicine

## 2011-02-15 ENCOUNTER — Ambulatory Visit
Admission: RE | Admit: 2011-02-15 | Discharge: 2011-02-15 | Disposition: A | Discharge status: Home / Self Care | Payer: Medicare Other | Source: Ambulatory Visit | Attending: Internal Medicine | Admitting: Internal Medicine

## 2011-02-15 DIAGNOSIS — R945 Abnormal results of liver function studies: Secondary | ICD-10-CM

## 2011-04-30 LAB — COMPREHENSIVE METABOLIC PANEL
AST: 57 — ABNORMAL HIGH
Albumin: 2.8 — ABNORMAL LOW
BUN: 4 — ABNORMAL LOW
Chloride: 107
Creatinine, Ser: 0.82
GFR calc non Af Amer: >60
Glucose, Bld: 92
Sodium: 138
Total Bilirubin: 1.1
Total Protein: 5 — ABNORMAL LOW

## 2011-04-30 LAB — CROSSMATCH

## 2011-04-30 LAB — HEMOGLOBIN AND HEMATOCRIT, BLOOD: HCT: 28.1 — ABNORMAL LOW

## 2011-04-30 LAB — DIFFERENTIAL
Basophils Absolute: 0
Basophils Relative: 0
Eosinophils Absolute: 0.2
Eosinophils Relative: 4
Lymphocytes Relative: 32
Lymphs Abs: 1.8
Monocytes Absolute: 0.8
Monocytes Relative: 15 — ABNORMAL HIGH
Neutro Abs: 2.8
Neutrophils Relative %: 49

## 2011-04-30 LAB — URINALYSIS, ROUTINE W REFLEX MICROSCOPIC
Bilirubin Urine: NEGATIVE
Nitrite: NEGATIVE
Specific Gravity, Urine: 1.01
Urobilinogen, UA: 0.2
pH: 6.5

## 2011-04-30 LAB — CBC
HCT: 20.8 — ABNORMAL LOW
HCT: 26.8 — ABNORMAL LOW
HCT: 26.9 — ABNORMAL LOW
HCT: 29.9 — ABNORMAL LOW
Hemoglobin: 10.5 — ABNORMAL LOW
Hemoglobin: 7.3 — CL
Hemoglobin: 9.6 — ABNORMAL LOW
Hemoglobin: 9.6 — ABNORMAL LOW
MCHC: 35.2
MCHC: 35.7
MCV: 93.3
MCV: 95.5
MCV: 96.1
Platelets: 104 — ABNORMAL LOW
Platelets: 81 — ABNORMAL LOW
Platelets: 99 — ABNORMAL LOW
RBC: 2.23 — ABNORMAL LOW
RBC: 2.79 — ABNORMAL LOW
RDW: 18.3 — ABNORMAL HIGH
RDW: 18.7 — ABNORMAL HIGH
RDW: 19.3 — ABNORMAL HIGH
RDW: 19.5 — ABNORMAL HIGH
WBC: 4.9

## 2011-04-30 LAB — PREPARE PLATELET PHERESIS

## 2011-05-01 LAB — CBC
HCT: 22.5 — ABNORMAL LOW
Hemoglobin: 8.2 — ABNORMAL LOW
Hemoglobin: 9.8 — ABNORMAL LOW
Platelets: 103 — ABNORMAL LOW
RBC: 2.23 — ABNORMAL LOW
RDW: 12.8
RDW: 19.6 — ABNORMAL HIGH
WBC: 5.1

## 2011-05-01 LAB — HEMOGLOBIN AND HEMATOCRIT, BLOOD
HCT: 20.7 — ABNORMAL LOW
HCT: 21.5 — ABNORMAL LOW
HCT: 24.4 — ABNORMAL LOW
HCT: 24.7 — ABNORMAL LOW
HCT: 25.9 — ABNORMAL LOW
HCT: 29.1 — ABNORMAL LOW
Hemoglobin: 8.2 — ABNORMAL LOW
Hemoglobin: 8.7 — ABNORMAL LOW
Hemoglobin: 8.9 — ABNORMAL LOW
Hemoglobin: 9 — ABNORMAL LOW
Hemoglobin: 9.4 — ABNORMAL LOW
Hemoglobin: 9.8 — ABNORMAL LOW

## 2011-05-01 LAB — DIFFERENTIAL
Basophils Absolute: 0
Basophils Relative: 1
Eosinophils Relative: 3
Lymphocytes Relative: 33
Lymphs Abs: 1.7
Monocytes Absolute: 0.7
Neutro Abs: 2.5

## 2011-05-01 LAB — PREPARE PLATELET PHERESIS

## 2011-05-01 LAB — CROSSMATCH
ABO/RH(D): AB POS
Antibody Screen: NEGATIVE

## 2011-05-01 LAB — BASIC METABOLIC PANEL
Calcium: 7.5 — ABNORMAL LOW
GFR calc Af Amer: >60
GFR calc non Af Amer: >60
Glucose, Bld: 118 — ABNORMAL HIGH
Potassium: 3.6
Sodium: 139

## 2011-05-01 LAB — PLATELET COUNT
Platelets: 53 — ABNORMAL LOW
Platelets: 57 — ABNORMAL LOW
Platelets: 91 — ABNORMAL LOW

## 2011-05-01 LAB — PREPARE RBC (CROSSMATCH)

## 2011-05-01 LAB — APTT: aPTT: 31

## 2011-05-07 LAB — COMPREHENSIVE METABOLIC PANEL
ALT: 90 — ABNORMAL HIGH
AST: 71 — ABNORMAL HIGH
Albumin: 3.3 — ABNORMAL LOW
BUN: 6
Calcium: 8.6
GFR calc Af Amer: >60
Sodium: 140
Total Bilirubin: 1.2
Total Protein: 6.5

## 2011-05-07 LAB — CBC
HCT: 34.9 — ABNORMAL LOW
Hemoglobin: 11.9 — ABNORMAL LOW
MCHC: 35.4
MCV: 99.2
RBC: 3.3 — ABNORMAL LOW
RBC: 3.55 — ABNORMAL LOW
RDW: 14.2 — ABNORMAL HIGH
RDW: 14.4 — ABNORMAL HIGH

## 2011-05-07 LAB — SAMPLE TO BLOOD BANK

## 2011-05-07 LAB — PREPARE PLATELET PHERESIS

## 2014-07-27 DIAGNOSIS — L84 Corns and callosities: Secondary | ICD-10-CM | POA: Diagnosis not present

## 2014-07-27 DIAGNOSIS — L602 Onychogryphosis: Secondary | ICD-10-CM | POA: Diagnosis not present

## 2014-10-26 DIAGNOSIS — L84 Corns and callosities: Secondary | ICD-10-CM | POA: Diagnosis not present

## 2014-10-26 DIAGNOSIS — L602 Onychogryphosis: Secondary | ICD-10-CM | POA: Diagnosis not present

## 2015-01-10 DIAGNOSIS — I10 Essential (primary) hypertension: Secondary | ICD-10-CM | POA: Diagnosis not present

## 2015-01-10 DIAGNOSIS — E782 Mixed hyperlipidemia: Secondary | ICD-10-CM | POA: Diagnosis not present

## 2015-01-17 DIAGNOSIS — E782 Mixed hyperlipidemia: Secondary | ICD-10-CM | POA: Diagnosis not present

## 2015-01-17 DIAGNOSIS — R9431 Abnormal electrocardiogram [ECG] [EKG]: Secondary | ICD-10-CM | POA: Diagnosis not present

## 2015-01-17 DIAGNOSIS — N529 Male erectile dysfunction, unspecified: Secondary | ICD-10-CM | POA: Diagnosis not present

## 2015-01-17 DIAGNOSIS — I10 Essential (primary) hypertension: Secondary | ICD-10-CM | POA: Diagnosis not present

## 2015-01-25 DIAGNOSIS — L84 Corns and callosities: Secondary | ICD-10-CM | POA: Diagnosis not present

## 2015-02-07 DIAGNOSIS — Z8551 Personal history of malignant neoplasm of bladder: Secondary | ICD-10-CM | POA: Diagnosis not present

## 2015-05-10 DIAGNOSIS — L03116 Cellulitis of left lower limb: Secondary | ICD-10-CM | POA: Diagnosis not present

## 2015-05-10 DIAGNOSIS — L02612 Cutaneous abscess of left foot: Secondary | ICD-10-CM | POA: Diagnosis not present

## 2015-05-10 DIAGNOSIS — L602 Onychogryphosis: Secondary | ICD-10-CM | POA: Diagnosis not present

## 2015-05-10 DIAGNOSIS — L84 Corns and callosities: Secondary | ICD-10-CM | POA: Diagnosis not present

## 2015-05-13 DIAGNOSIS — L03032 Cellulitis of left toe: Secondary | ICD-10-CM | POA: Diagnosis not present

## 2015-05-16 DIAGNOSIS — I10 Essential (primary) hypertension: Secondary | ICD-10-CM | POA: Diagnosis not present

## 2015-05-16 DIAGNOSIS — C679 Malignant neoplasm of bladder, unspecified: Secondary | ICD-10-CM | POA: Diagnosis not present

## 2015-05-16 DIAGNOSIS — G603 Idiopathic progressive neuropathy: Secondary | ICD-10-CM | POA: Diagnosis not present

## 2015-05-16 DIAGNOSIS — E782 Mixed hyperlipidemia: Secondary | ICD-10-CM | POA: Diagnosis not present

## 2015-05-16 DIAGNOSIS — R9431 Abnormal electrocardiogram [ECG] [EKG]: Secondary | ICD-10-CM | POA: Diagnosis not present

## 2015-05-16 DIAGNOSIS — E039 Hypothyroidism, unspecified: Secondary | ICD-10-CM | POA: Diagnosis not present

## 2015-05-16 DIAGNOSIS — Z23 Encounter for immunization: Secondary | ICD-10-CM | POA: Diagnosis not present

## 2015-05-16 DIAGNOSIS — D696 Thrombocytopenia, unspecified: Secondary | ICD-10-CM | POA: Diagnosis not present

## 2015-05-18 DIAGNOSIS — M2041 Other hammer toe(s) (acquired), right foot: Secondary | ICD-10-CM | POA: Diagnosis not present

## 2015-05-18 DIAGNOSIS — M2042 Other hammer toe(s) (acquired), left foot: Secondary | ICD-10-CM | POA: Diagnosis not present

## 2015-05-18 DIAGNOSIS — L97501 Non-pressure chronic ulcer of other part of unspecified foot limited to breakdown of skin: Secondary | ICD-10-CM | POA: Diagnosis not present

## 2015-05-23 DIAGNOSIS — E782 Mixed hyperlipidemia: Secondary | ICD-10-CM | POA: Diagnosis not present

## 2015-05-23 DIAGNOSIS — I10 Essential (primary) hypertension: Secondary | ICD-10-CM | POA: Diagnosis not present

## 2015-05-23 DIAGNOSIS — Z0000 Encounter for general adult medical examination without abnormal findings: Secondary | ICD-10-CM | POA: Diagnosis not present

## 2015-05-23 DIAGNOSIS — E039 Hypothyroidism, unspecified: Secondary | ICD-10-CM | POA: Diagnosis not present

## 2015-07-05 DIAGNOSIS — L84 Corns and callosities: Secondary | ICD-10-CM | POA: Diagnosis not present

## 2015-07-05 DIAGNOSIS — L602 Onychogryphosis: Secondary | ICD-10-CM | POA: Diagnosis not present

## 2015-08-15 DIAGNOSIS — R634 Abnormal weight loss: Secondary | ICD-10-CM | POA: Diagnosis not present

## 2015-08-22 DIAGNOSIS — E782 Mixed hyperlipidemia: Secondary | ICD-10-CM | POA: Diagnosis not present

## 2015-08-22 DIAGNOSIS — I10 Essential (primary) hypertension: Secondary | ICD-10-CM | POA: Diagnosis not present

## 2015-08-22 DIAGNOSIS — R634 Abnormal weight loss: Secondary | ICD-10-CM | POA: Diagnosis not present

## 2015-08-23 ENCOUNTER — Other Ambulatory Visit: Payer: Self-pay | Admitting: Internal Medicine

## 2015-08-23 DIAGNOSIS — R634 Abnormal weight loss: Secondary | ICD-10-CM

## 2015-09-21 ENCOUNTER — Emergency Department (HOSPITAL_COMMUNITY): Payer: Medicare Other

## 2015-09-21 ENCOUNTER — Emergency Department (HOSPITAL_COMMUNITY)
Admission: EM | Admit: 2015-09-21 | Discharge: 2015-09-21 | Disposition: A | Discharge status: Home / Self Care | Payer: Medicare Other | Attending: Emergency Medicine | Admitting: Emergency Medicine

## 2015-09-21 ENCOUNTER — Encounter (HOSPITAL_COMMUNITY): Payer: Self-pay | Admitting: Family Medicine

## 2015-09-21 DIAGNOSIS — E860 Dehydration: Secondary | ICD-10-CM | POA: Insufficient documentation

## 2015-09-21 DIAGNOSIS — M25551 Pain in right hip: Secondary | ICD-10-CM | POA: Diagnosis not present

## 2015-09-21 DIAGNOSIS — M79604 Pain in right leg: Secondary | ICD-10-CM | POA: Diagnosis not present

## 2015-09-21 DIAGNOSIS — R634 Abnormal weight loss: Secondary | ICD-10-CM | POA: Diagnosis not present

## 2015-09-21 DIAGNOSIS — Z8551 Personal history of malignant neoplasm of bladder: Secondary | ICD-10-CM | POA: Diagnosis not present

## 2015-09-21 DIAGNOSIS — J449 Chronic obstructive pulmonary disease, unspecified: Secondary | ICD-10-CM | POA: Diagnosis not present

## 2015-09-21 DIAGNOSIS — Z8639 Personal history of other endocrine, nutritional and metabolic disease: Secondary | ICD-10-CM | POA: Insufficient documentation

## 2015-09-21 DIAGNOSIS — R1030 Lower abdominal pain, unspecified: Secondary | ICD-10-CM | POA: Diagnosis not present

## 2015-09-21 DIAGNOSIS — R630 Anorexia: Secondary | ICD-10-CM | POA: Insufficient documentation

## 2015-09-21 DIAGNOSIS — M79661 Pain in right lower leg: Secondary | ICD-10-CM | POA: Diagnosis not present

## 2015-09-21 DIAGNOSIS — R1084 Generalized abdominal pain: Secondary | ICD-10-CM | POA: Insufficient documentation

## 2015-09-21 HISTORY — DX: Disorder of thyroid, unspecified: E07.9

## 2015-09-21 LAB — COMPREHENSIVE METABOLIC PANEL
ALBUMIN: 3.9 g/dL (ref 3.5–5.0)
ALT: 44 U/L (ref 17–63)
AST: 65 U/L — AB (ref 15–41)
Alkaline Phosphatase: 49 U/L (ref 38–126)
Anion gap: 13 (ref 5–15)
BUN: 11 mg/dL (ref 6–20)
CHLORIDE: 106 mmol/L (ref 101–111)
CO2: 24 mmol/L (ref 22–32)
Calcium: 9 mg/dL (ref 8.9–10.3)
Creatinine, Ser: 0.85 mg/dL (ref 0.61–1.24)
GFR calc Af Amer: >60 mL/min (ref >60)
GFR calc non Af Amer: >60 mL/min (ref >60)
GLUCOSE: 98 mg/dL (ref 65–99)
POTASSIUM: 6.4 mmol/L — AB (ref 3.5–5.1)
Sodium: 143 mmol/L (ref 135–145)
Total Bilirubin: 1.1 mg/dL (ref 0.3–1.2)
Total Protein: 7.3 g/dL (ref 6.5–8.1)

## 2015-09-21 LAB — I-STAT CHEM 8, ED
BUN: 12 mg/dL (ref 6–20)
CREATININE: 0.6 mg/dL — AB (ref 0.61–1.24)
Calcium, Ion: 1.05 mmol/L — ABNORMAL LOW (ref 1.13–1.30)
Chloride: 106 mmol/L (ref 101–111)
GLUCOSE: 96 mg/dL (ref 65–99)
HCT: 43 % (ref 39.0–52.0)
Hemoglobin: 14.6 g/dL (ref 13.0–17.0)
Potassium: 3.8 mmol/L (ref 3.5–5.1)
Sodium: 143 mmol/L (ref 135–145)
TCO2: 23 mmol/L (ref 0–100)

## 2015-09-21 LAB — LIPASE, BLOOD: Lipase: 32 U/L (ref 11–51)

## 2015-09-21 LAB — CBC WITH DIFFERENTIAL/PLATELET
BASOS ABS: 0 10*3/uL (ref 0.0–0.1)
BASOS PCT: 0 %
Eosinophils Absolute: 0.1 10*3/uL (ref 0.0–0.7)
Eosinophils Relative: 2 %
HCT: 44.4 % (ref 39.0–52.0)
Hemoglobin: 15 g/dL (ref 13.0–17.0)
LYMPHS ABS: 1.3 10*3/uL (ref 0.7–4.0)
LYMPHS PCT: 22 %
MCH: 33 pg (ref 26.0–34.0)
MCHC: 33.8 g/dL (ref 30.0–36.0)
MCV: 97.6 fL (ref 78.0–100.0)
Monocytes Absolute: 0.7 10*3/uL (ref 0.1–1.0)
Monocytes Relative: 12 %
Neutro Abs: 3.8 10*3/uL (ref 1.7–7.7)
Neutrophils Relative %: 64 %
Platelets: 124 10*3/uL — ABNORMAL LOW (ref 150–400)
RBC: 4.55 MIL/uL (ref 4.22–5.81)
RDW: 13.1 % (ref 11.5–15.5)
WBC: 6 10*3/uL (ref 4.0–10.5)

## 2015-09-21 LAB — URINALYSIS, ROUTINE W REFLEX MICROSCOPIC
Bilirubin Urine: NEGATIVE
GLUCOSE, UA: NEGATIVE mg/dL
Hgb urine dipstick: NEGATIVE
Ketones, ur: NEGATIVE mg/dL
LEUKOCYTES UA: NEGATIVE
Nitrite: NEGATIVE
PH: 5.5 (ref 5.0–8.0)
Protein, ur: NEGATIVE mg/dL
SPECIFIC GRAVITY, URINE: 1.025 (ref 1.005–1.030)

## 2015-09-21 MED ORDER — SODIUM CHLORIDE 0.9 % IV BOLUS (SEPSIS)
1000.0000 mL | Freq: Once | INTRAVENOUS | Status: AC
  Administered 2015-09-21: 1000 mL via INTRAVENOUS

## 2015-09-21 MED ORDER — MORPHINE SULFATE (PF) 4 MG/ML IV SOLN
4.0000 mg | Freq: Once | INTRAVENOUS | Status: AC
  Administered 2015-09-21: 4 mg via INTRAVENOUS
  Filled 2015-09-21: qty 1

## 2015-09-21 MED ORDER — HYDROCODONE-ACETAMINOPHEN 5-325 MG PO TABS
1.0000 | ORAL_TABLET | Freq: Four times a day (QID) | ORAL | Status: DC | PRN
Start: 2015-09-21 — End: 2015-09-23

## 2015-09-21 MED ORDER — CYCLOBENZAPRINE HCL 5 MG PO TABS: 5.0000 mg | ORAL_TABLET | Freq: Three times a day (TID) | ORAL | Status: DC | PRN

## 2015-09-21 MED ORDER — IOHEXOL 300 MG/ML  SOLN
100.0000 mL | Freq: Once | INTRAMUSCULAR | Status: AC | PRN
  Administered 2015-09-21: 100 mL via INTRAVENOUS

## 2015-09-21 NOTE — Discharge Instructions (Signed)
Take tylenol for pain.  Take vicodin for severe pain.   Take flexeril for muscle spasms.   See your doctor about your weight loss.   Return to ER if you have severe pain, vomiting, fevers, dehydration, unable to walk.

## 2015-09-21 NOTE — ED Provider Notes (Addendum)
CSN: [id number]     Arrival date & time 09/21/15  [id number] History   First MD Initiated Contact with Patient 09/21/15 [phone]     Chief Complaint  Patient presents with  . Hip Pain     (Consider location/radiation/quality/duration/timing/severity/associated sxs/prior Treatment) The history is provided by the patient.  Lucas Fuller is a 75 y.o. male hx of bladder cancer s/p surgery here with weight loss, hip pain. Intermittent hip pain for the last week or so. States that pain was worse last night it radiated down his leg. Patient has poor appetite for the several months. Patient had about a 20 pound weight loss over the last 3 months. Patient denies any cough or fevers. Patient is not a smoker. Patient has no history of lung cancer in the past. Denies any urinary symptoms. Denies any vomiting. Saw PCP, scheduled for CT ab/pel.    Past Medical History  Diagnosis Date  . Thyroid disease    Past Surgical History  Procedure Laterality Date  . Bladder surgery     History reviewed. No pertinent family history. Social History  Substance Use Topics  . Smoking status: Never Smoker   . Smokeless tobacco: None  . Alcohol Use: Yes     Comment: 2 or 3 shots a day    Review of Systems  Gastrointestinal: Positive for abdominal pain.  Musculoskeletal:       R leg pain   All other systems reviewed and are negative.     Allergies  Asa  Home Medications   Prior to Admission medications   Not on File   BP 160/86 mmHg  Pulse 81  Temp(Src) 97.9 F (36.6 C) (Oral)  Resp 17  SpO2 96% Physical Exam  Constitutional: He is oriented to person, place, and time.  Thin, dehydrated   HENT:  Head: Normocephalic.  MM dry   Eyes: Conjunctivae are normal. Pupils are equal, round, and reactive to light.  Neck: Normal range of motion. Neck supple.  Cardiovascular: Normal rate, regular rhythm and normal heart sounds.   Pulmonary/Chest: Effort normal and breath sounds normal. No respiratory  distress. He has no wheezes. He has no rales.  Abdominal: Soft. Bowel sounds are normal.  Mild diffuse lower abdominal tenderness, no rebound   Musculoskeletal:  No spinal tenderness. Nl ROM R hip, mild tenderness laterally but no obvious deformity   Neurological: He is alert and oriented to person, place, and time.  Neg straight leg raise. Neurovascular intact lower extremities   Skin: Skin is warm and dry.  Psychiatric: He has a normal mood and affect. His behavior is normal. Judgment and thought content normal.  Nursing note and vitals reviewed.   ED Course  Procedures (including critical care time) Labs Review Labs Reviewed  CBC WITH DIFFERENTIAL/PLATELET - Abnormal; Notable for the following:    Platelets 124 (*)    All other components within normal limits  COMPREHENSIVE METABOLIC PANEL - Abnormal; Notable for the following:    Potassium 6.4 (*)    AST 65 (*)    All other components within normal limits  I-STAT CHEM 8, ED - Abnormal; Notable for the following:    Creatinine, Ser 0.60 (*)    Calcium, Ion 1.05 (*)    All other components within normal limits  LIPASE, BLOOD  URINALYSIS, ROUTINE W REFLEX MICROSCOPIC (NOT AT [facility])    Imaging Review Dg Chest 2 View  09/21/2015  CLINICAL DATA:  Weight loss and weakness for 10 months EXAM: CHEST  2 VIEW COMPARISON:  06/22/2009 FINDINGS: Borderline enlargement of cardiac silhouette. Mediastinal contours and pulmonary vascularity normal. Minimal atherosclerotic calcification aortic arch. 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 No acute infiltrate, pleural effusion or pneumothorax. Endplate spur formation thoracic spine without acute bony abnormalities. IMPRESSION: COPD changes without acute infiltrate. Electronically Signed   By: Lavonia Dana M.D.   On: 09/21/2015 10:32   Dg Tibia/fibula Right  09/21/2015  CLINICAL DATA:  Right lower leg pain for 1 day. No known injury. Initial encounter. EXAM: RIGHT TIBIA AND FIBULA -  2 VIEW COMPARISON:  None. FINDINGS: No acute bony or joint abnormality is identified. No evidence of arthropathy is seen. No radiopaque foreign body or soft tissue gas collection is identified. IMPRESSION: Negative exam. Electronically Signed   By: Inge Rise M.D.   On: 09/21/2015 13:28   Ct Abdomen Pelvis W Contrast  09/21/2015  CLINICAL DATA:  Unintentional weight loss. History of bladder cancer. EXAM: CT ABDOMEN AND PELVIS WITH CONTRAST TECHNIQUE: Multidetector CT imaging of the abdomen and pelvis was performed using the standard protocol following bolus administration of intravenous contrast. CONTRAST:  177mL OMNIPAQUE IOHEXOL 300 MG/ML  SOLN COMPARISON:  07/03/2010 FINDINGS: Lower chest: Lung bases are clear. No effusions. Heart is normal size. Hepatobiliary: Normal Pancreas: Normal Spleen: Normal Adrenals/Urinary Tract: Normal Stomach/Bowel: Appendix is normal. Stomach, large and small bowel unremarkable. Vascular/Lymphatic: No evidence of aneurysm or adenopathy. Other: No free fluid or free air. Musculoskeletal: Degenerative changes in the lumbar spine. No acute findings. IMPRESSION: No acute findings or significant abnormality in the abdomen or pelvis. Electronically Signed   By: Rolm Baptise M.D.   On: 09/21/2015 11:55   Dg Hip Unilat  With Pelvis 2-3 Views Right  09/21/2015  CLINICAL DATA:  Right-sided pain for 1 day. No history of recent trauma EXAM: DG HIP (WITH OR WITHOUT PELVIS) 2-3V RIGHT COMPARISON:  None. FINDINGS: Frontal pelvis as well as frontal and lateral right hip images were obtained. There is no appreciable fracture or dislocation. There is slight symmetric narrowing of both hip joints. No erosive change. There is postoperative change in the right upper inguinal region. IMPRESSION: Mild symmetric narrowing both hip joints. No fracture or dislocation. Electronically Signed   By: Lowella Grip III M.D.   On: 09/21/2015 09:45   Dg Femur, Min 2 Views Right  09/21/2015  CLINICAL  DATA:  Right leg pain for 1 day. EXAM: RIGHT FEMUR 2 VIEWS COMPARISON:  None. FINDINGS: There is no fracture or or other focal bone lesion. Minimal degenerative changes at the right hip and right knee. IMPRESSION: No significant abnormality. Electronically Signed   By: Lorriane Shire M.D.   On: 09/21/2015 14:01   I have personally reviewed and evaluated these images and lab results as part of my medical decision-making.   EKG Interpretation None      MDM   Final diagnoses:  None    CARLON WININGS is a 75 y.o. male here with R leg pain, weight loss. Consider recurrent cancer with mets to bone vs sciatica. Will get labs, UA, Ct ab/pel, xrays.   2:17 PM Labs unremarkable. CT ab/pel showed no mass or recurrent cancer. Xrays unremarkable. Pain controlled. Able to ambulate. No saddle anesthesia, neurovascular intact. Will dc home with pain meds, flexeril.    Wandra Arthurs, MD 09/21/15 1417  Wandra Arthurs, MD 09/21/15 [phone]

## 2015-09-21 NOTE — ED Notes (Signed)
Pt here for right hip pain radiating down right leg. sts started yesterday. Denies fall or injury. sts that he also has been loosing a lot of weight. sts is scheduled for cat scan.

## 2015-09-22 ENCOUNTER — Emergency Department (HOSPITAL_COMMUNITY): Payer: Medicare Other

## 2015-09-22 ENCOUNTER — Emergency Department (HOSPITAL_COMMUNITY)
Admission: EM | Admit: 2015-09-22 | Discharge: 2015-09-23 | Disposition: A | Discharge status: Home / Self Care | Payer: Medicare Other | Attending: Emergency Medicine | Admitting: Emergency Medicine

## 2015-09-22 ENCOUNTER — Encounter (HOSPITAL_COMMUNITY): Payer: Self-pay | Admitting: *Deleted

## 2015-09-22 DIAGNOSIS — R1031 Right lower quadrant pain: Secondary | ICD-10-CM | POA: Insufficient documentation

## 2015-09-22 DIAGNOSIS — R52 Pain, unspecified: Secondary | ICD-10-CM | POA: Diagnosis not present

## 2015-09-22 DIAGNOSIS — Z79899 Other long term (current) drug therapy: Secondary | ICD-10-CM | POA: Insufficient documentation

## 2015-09-22 DIAGNOSIS — M545 Low back pain: Secondary | ICD-10-CM | POA: Diagnosis not present

## 2015-09-22 DIAGNOSIS — Z8551 Personal history of malignant neoplasm of bladder: Secondary | ICD-10-CM | POA: Diagnosis not present

## 2015-09-22 DIAGNOSIS — E079 Disorder of thyroid, unspecified: Secondary | ICD-10-CM | POA: Diagnosis not present

## 2015-09-22 DIAGNOSIS — M79604 Pain in right leg: Secondary | ICD-10-CM | POA: Diagnosis not present

## 2015-09-22 DIAGNOSIS — M79661 Pain in right lower leg: Secondary | ICD-10-CM | POA: Diagnosis not present

## 2015-09-22 DIAGNOSIS — M25551 Pain in right hip: Secondary | ICD-10-CM | POA: Diagnosis not present

## 2015-09-22 HISTORY — DX: Malignant neoplasm of bladder, unspecified: C67.9

## 2015-09-22 LAB — COMPREHENSIVE METABOLIC PANEL
ALBUMIN: 3.8 g/dL (ref 3.5–5.0)
ALT: 24 U/L (ref 17–63)
AST: 29 U/L (ref 15–41)
Alkaline Phosphatase: 48 U/L (ref 38–126)
Anion gap: 11 (ref 5–15)
BUN: 5 mg/dL — ABNORMAL LOW (ref 6–20)
CHLORIDE: 104 mmol/L (ref 101–111)
CO2: 25 mmol/L (ref 22–32)
Calcium: 9 mg/dL (ref 8.9–10.3)
Creatinine, Ser: 0.84 mg/dL (ref 0.61–1.24)
GFR calc non Af Amer: >60 mL/min (ref >60)
GLUCOSE: 113 mg/dL — AB (ref 65–99)
Potassium: 3.9 mmol/L (ref 3.5–5.1)
SODIUM: 140 mmol/L (ref 135–145)
Total Bilirubin: 1.2 mg/dL (ref 0.3–1.2)
Total Protein: 7.2 g/dL (ref 6.5–8.1)

## 2015-09-22 LAB — CBC WITH DIFFERENTIAL/PLATELET
BASOS PCT: 1 %
Basophils Absolute: 0 10*3/uL (ref 0.0–0.1)
EOS ABS: 0.2 10*3/uL (ref 0.0–0.7)
EOS PCT: 3 %
HCT: 44.2 % (ref 39.0–52.0)
HEMOGLOBIN: 14.9 g/dL (ref 13.0–17.0)
LYMPHS ABS: 1.6 10*3/uL (ref 0.7–4.0)
Lymphocytes Relative: 26 %
MCH: 33 pg (ref 26.0–34.0)
MCHC: 33.7 g/dL (ref 30.0–36.0)
MCV: 97.8 fL (ref 78.0–100.0)
Monocytes Absolute: 0.5 10*3/uL (ref 0.1–1.0)
Monocytes Relative: 9 %
NEUTROS PCT: 61 %
Neutro Abs: 3.7 10*3/uL (ref 1.7–7.7)
PLATELETS: 130 10*3/uL — AB (ref 150–400)
RBC: 4.52 MIL/uL (ref 4.22–5.81)
RDW: 12.9 % (ref 11.5–15.5)
WBC: 6 10*3/uL (ref 4.0–10.5)

## 2015-09-22 MED ORDER — SODIUM CHLORIDE 0.9 % IV SOLN
INTRAVENOUS | Status: DC
  Administered 2015-09-22: 1000 mL via INTRAVENOUS

## 2015-09-22 MED ORDER — LORAZEPAM 2 MG/ML IJ SOLN
0.5000 mg | Freq: Once | INTRAMUSCULAR | Status: AC
  Administered 2015-09-22: 0.5 mg via INTRAVENOUS
  Filled 2015-09-22: qty 1

## 2015-09-22 MED ORDER — MORPHINE SULFATE (PF) 4 MG/ML IV SOLN
4.0000 mg | Freq: Once | INTRAVENOUS | Status: AC
  Administered 2015-09-22: 4 mg via INTRAVENOUS
  Filled 2015-09-22: qty 1

## 2015-09-22 NOTE — ED Notes (Addendum)
Per [facility] - pt from home w/ c/o rt leg pain that began on Tuesday pt denies any mechanism of injury, pain radiates from right hip to right knee - pt was seen at this facility for evaluation of the pain. Pt states pain is worse with ambulation. Denies chest pain or shortness of breath. CMS intact to bilat lower extremities.

## 2015-09-22 NOTE — ED Provider Notes (Addendum)
CSN: [id number]     Arrival date & time 09/22/15  1654 History   First MD Initiated Contact with Patient 09/22/15 2140     Chief Complaint  Patient presents with  . Claudication     (Consider location/radiation/quality/duration/timing/severity/associated sxs/prior Treatment) HPI Comments: Patient here complaining of worsening right groin pain that began Tuesday. Symptoms are atraumatic and not associated with back pain or bowel/bladder dysfunction. Pain characterized as sharp and worse with standing and radiates down into his whole right lower extremity. Denies any numbness to his foot. Seen in the ED for similar symptoms yesterday had a CT of his abdomen and pelvis was did not show any acute findings. He presents now because he is able to ambulate with the use of crutches  The history is provided by the patient.    Past Medical History  Diagnosis Date  . Thyroid disease   . Bladder cancer [facility]    Past Surgical History  Procedure Laterality Date  . Bladder surgery     History reviewed. No pertinent family history. Social History  Substance Use Topics  . Smoking status: Never Smoker   . Smokeless tobacco: None  . Alcohol Use: Yes     Comment: 2 or 3 shots a day    Review of Systems  All other systems reviewed and are negative.     Allergies  Asa  Home Medications   Prior to Admission medications   Medication Sig Start Date End Date Taking? Authorizing Provider  cyclobenzaprine (FLEXERIL) 5 MG tablet Take 1 tablet (5 mg total) by mouth 3 (three) times daily as needed for muscle spasms. 09/21/15  Yes Wandra Arthurs, MD  gabapentin (NEURONTIN) 100 MG capsule Take 200 mg by mouth 3 (three) times daily.   Yes Historical Provider, MD  HYDROcodone-acetaminophen (NORCO/VICODIN) 5-325 MG tablet Take 1-2 tablets by mouth every 6 (six) hours as needed. Patient taking differently: Take 1-2 tablets by mouth every 6 (six) hours as needed for moderate pain.  09/21/15  Yes Wandra Arthurs, MD    levothyroxine (SYNTHROID, LEVOTHROID) 50 MCG tablet Take 50 mcg by mouth daily before breakfast.   Yes Historical Provider, MD   BP 173/92 mmHg  Pulse 73  Temp(Src) 98.3 F (36.8 C) (Oral)  Resp 16  Ht 5\' 7"  (1.702 m)  Wt 71.215 kg  BMI 24.58 kg/m2  SpO2 98% Physical Exam  Constitutional: He is oriented to person, place, and time. He appears well-developed and well-nourished.  Non-toxic appearance. No distress.  HENT:  Head: Normocephalic and atraumatic.  Eyes: Conjunctivae, EOM and lids are normal. Pupils are equal, round, and reactive to light.  Neck: Normal range of motion. Neck supple. No tracheal deviation present. No thyroid mass present.  Cardiovascular: Normal rate, regular rhythm and normal heart sounds.  Exam reveals no gallop.   No murmur heard. Pulmonary/Chest: Effort normal and breath sounds normal. No stridor. No respiratory distress. He has no decreased breath sounds. He has no wheezes. He has no rhonchi. He has no rales.  Abdominal: Soft. Normal appearance and bowel sounds are normal. He exhibits no distension. There is no tenderness. There is no rebound and no CVA tenderness.  Musculoskeletal: Normal range of motion. He exhibits no edema or tenderness.       Legs: Right lower extremity without swelling or edema. Tender to palpation and groin. Right femoral and right pedis pulse strong. Neurovascular status intact at right foot.  Nontender along patient's lumbar and sacral spine.  Neurological: He is  alert and oriented to person, place, and time. He has normal strength. No cranial nerve deficit or sensory deficit. GCS eye subscore is 4. GCS verbal subscore is 5. GCS motor subscore is 6.  Right lower extremity strength 4/5  Skin: Skin is warm and dry. No abrasion and no rash noted.  Psychiatric: He has a normal mood and affect. His speech is normal and behavior is normal.  Nursing note and vitals reviewed.   ED Course  Procedures (including critical care time) Labs  Review Labs Reviewed  CBC WITH DIFFERENTIAL/PLATELET  COMPREHENSIVE METABOLIC PANEL    Imaging Review Dg Chest 2 View  09/21/2015  CLINICAL DATA:  Weight loss and weakness for 10 months EXAM: CHEST  2 VIEW COMPARISON:  06/22/2009 FINDINGS: Borderline enlargement of cardiac silhouette. Mediastinal contours and pulmonary vascularity normal. Minimal atherosclerotic calcification aortic arch. 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 No acute infiltrate, pleural effusion or pneumothorax. Endplate spur formation thoracic spine without acute bony abnormalities. IMPRESSION: COPD changes without acute infiltrate. Electronically Signed   By: Lavonia Dana M.D.   On: 09/21/2015 10:32   Dg Tibia/fibula Right  09/21/2015  CLINICAL DATA:  Right lower leg pain for 1 day. No known injury. Initial encounter. EXAM: RIGHT TIBIA AND FIBULA - 2 VIEW COMPARISON:  None. FINDINGS: No acute bony or joint abnormality is identified. No evidence of arthropathy is seen. No radiopaque foreign body or soft tissue gas collection is identified. IMPRESSION: Negative exam. Electronically Signed   By: Inge Rise M.D.   On: 09/21/2015 13:28   Ct Abdomen Pelvis W Contrast  09/21/2015  CLINICAL DATA:  Unintentional weight loss. History of bladder cancer. EXAM: CT ABDOMEN AND PELVIS WITH CONTRAST TECHNIQUE: Multidetector CT imaging of the abdomen and pelvis was performed using the standard protocol following bolus administration of intravenous contrast. CONTRAST:  19mL OMNIPAQUE IOHEXOL 300 MG/ML  SOLN COMPARISON:  07/03/2010 FINDINGS: Lower chest: Lung bases are clear. No effusions. Heart is normal size. Hepatobiliary: Normal Pancreas: Normal Spleen: Normal Adrenals/Urinary Tract: Normal Stomach/Bowel: Appendix is normal. Stomach, large and small bowel unremarkable. Vascular/Lymphatic: No evidence of aneurysm or adenopathy. Other: No free fluid or free air. Musculoskeletal: Degenerative changes in the lumbar spine. No  acute findings. IMPRESSION: No acute findings or significant abnormality in the abdomen or pelvis. Electronically Signed   By: Rolm Baptise M.D.   On: 09/21/2015 11:55   Dg Hip Unilat  With Pelvis 2-3 Views Right  09/21/2015  CLINICAL DATA:  Right-sided pain for 1 day. No history of recent trauma EXAM: DG HIP (WITH OR WITHOUT PELVIS) 2-3V RIGHT COMPARISON:  None. FINDINGS: Frontal pelvis as well as frontal and lateral right hip images were obtained. There is no appreciable fracture or dislocation. There is slight symmetric narrowing of both hip joints. No erosive change. There is postoperative change in the right upper inguinal region. IMPRESSION: Mild symmetric narrowing both hip joints. No fracture or dislocation. Electronically Signed   By: Lowella Grip III M.D.   On: 09/21/2015 09:45   Dg Femur, Min 2 Views Right  09/21/2015  CLINICAL DATA:  Right leg pain for 1 day. EXAM: RIGHT FEMUR 2 VIEWS COMPARISON:  None. FINDINGS: There is no fracture or or other focal bone lesion. Minimal degenerative changes at the right hip and right knee. IMPRESSION: No significant abnormality. Electronically Signed   By: Lorriane Shire M.D.   On: 09/21/2015 14:01   I have personally reviewed and evaluated these images and lab results as part of  my medical decision-making.   EKG Interpretation None      MDM   Final diagnoses:  None    Patient given pain medication here feels better. CT of his hip per radiologist without acute findings. Patient has no evidence of vascular emergency. Has crutches at home and feels he can use them at this time.   Patient has pinpoint tenderness in his mid groin without evidence of hernia. Suspect musculoskeletal etiology of his symptoms and will place on steroids as well as pain medication . Attempted to ambulate patient and he has some difficulty. He states that he was able to stay overnight that he feels that he would be stable for discharge in morning. Will sign out to all  common provider, Dr. Phylliss Blakes, MD 09/23/15 [id number]  Lacretia Leigh, MD 09/23/15 [phone]

## 2015-09-23 DIAGNOSIS — R269 Unspecified abnormalities of gait and mobility: Secondary | ICD-10-CM | POA: Diagnosis not present

## 2015-09-23 DIAGNOSIS — M25551 Pain in right hip: Secondary | ICD-10-CM | POA: Diagnosis not present

## 2015-09-23 MED ORDER — PREDNISONE 20 MG PO TABS: 40.0000 mg | ORAL_TABLET | Freq: Every day | ORAL | Status: DC

## 2015-09-23 MED ORDER — METHOCARBAMOL 500 MG PO TABS: 500.0000 mg | ORAL_TABLET | Freq: Two times a day (BID) | ORAL | Status: DC

## 2015-09-23 MED ORDER — IBUPROFEN 400 MG PO TABS
600.0000 mg | ORAL_TABLET | Freq: Once | ORAL | Status: AC
  Administered 2015-09-23: 600 mg via ORAL
  Filled 2015-09-23: qty 1

## 2015-09-23 MED ORDER — METHYLPREDNISOLONE SODIUM SUCC 125 MG IJ SOLR
125.0000 mg | Freq: Once | INTRAMUSCULAR | Status: AC
  Administered 2015-09-23: 125 mg via INTRAVENOUS
  Filled 2015-09-23: qty 2

## 2015-09-23 MED ORDER — OXYCODONE-ACETAMINOPHEN 5-325 MG PO TABS: 1.0000 | ORAL_TABLET | ORAL | Status: DC | PRN

## 2015-09-23 NOTE — ED Notes (Signed)
Md at bedside

## 2015-09-23 NOTE — ED Notes (Signed)
Patient lunch order was taken.

## 2015-09-23 NOTE — Evaluation (Signed)
Physical Therapy Evaluation Patient Details Name: Lucas Fuller MRN: [id number] DOB: 26-Jun-1941 Today's Date: 09/23/2015   History of Present Illness  pt presents with R LE pain.  pt with hx of Thyroid Disease and Bladder CA.    Clinical Impression  Pt generally unsteady with mobility and with heavy reliance on RW for support during mobility.  Discussed with pt D/C planning and safety of going home to 2nd floor apartment alone.  Pt indicates he does have a niece and feel that he would be safest to D/C to her home for additional support.  Will continue to follow if remains on acute.      Follow Up Recommendations Home health PT;Supervision for mobility/OOB    Equipment Recommendations  Rolling walker with 5" wheels    Recommendations for Other Services       Precautions / Restrictions Precautions Precautions: Fall Restrictions Weight Bearing Restrictions: No      Mobility  Bed Mobility Overal bed mobility: Needs Assistance Bed Mobility: Supine to Sit;Sit to Supine     Supine to sit: Supervision Sit to supine: Supervision   General bed mobility comments: pt moves slowly and somewhat labored, but is able to complete without physical A.    Transfers Overall transfer level: Needs assistance Equipment used: Rolling walker (2 wheeled) Transfers: Sit to/from Stand Sit to Stand: Min guard         General transfer comment: cues for UE use.  Ambulation/Gait Ambulation/Gait assistance: Min guard Ambulation Distance (Feet): 60 Feet Assistive device: Rolling walker (2 wheeled) Gait Pattern/deviations: Step-through pattern;Decreased stride length;Antalgic     General Gait Details: pt moves slowly and somewhat unsteady with heavy reliance on RW for support.  pt indicates pain is 9/10 in R knee during mobility and pain limits ambulation distance.    Stairs            Wheelchair Mobility    Modified Rankin (Stroke Patients Only)       Balance Overall balance  assessment: Needs assistance Sitting-balance support: No upper extremity supported;Feet supported Sitting balance-Leahy Scale: Good     Standing balance support: Single extremity supported;Bilateral upper extremity supported;During functional activity Standing balance-Leahy Scale: Poor Standing balance comment: Heavy reliance on UE support                             Pertinent Vitals/Pain Pain Assessment: 0-10 Pain Score: 9  Pain Location: R knee Pain Descriptors / Indicators: Aching Pain Intervention(s): Monitored during session;Premedicated before session;Repositioned    Home Living Family/patient expects to be discharged to:: Private residence Living Arrangements: Alone;Other relatives (Niece) Available Help at Discharge: Family;Available PRN/intermittently Type of Home: Apartment Home Access: Stairs to enter Entrance Stairs-Rails: Right;Left Entrance Stairs-Number of Steps: Flight   Home Equipment: Crutches Additional Comments: pt lives in 2nd floor apartment and indicates his only family is his niece, who works.      Prior Function Level of Independence: Independent         Comments: pt states independent, but question how well pt was truly doing on his own.       Hand Dominance        Extremity/Trunk Assessment   Upper Extremity Assessment: Overall WFL for tasks assessed           Lower Extremity Assessment: RLE deficits/detail RLE Deficits / Details: AROM WFL, but pt does indicate pain with knee ROM.  SEnsation intact.  Strength grossly 4-/5 due to pain.  Cervical / Trunk Assessment: Kyphotic  Communication   Communication: No difficulties  Cognition Arousal/Alertness: Awake/alert Behavior During Therapy: WFL for tasks assessed/performed Overall Cognitive Status: Within Functional Limits for tasks assessed                      General Comments      Exercises        Assessment/Plan    PT Assessment Patient needs  continued PT services  PT Diagnosis Difficulty walking   PT Problem List Decreased strength;Decreased activity tolerance;Decreased balance;Decreased mobility;Decreased coordination;Decreased knowledge of use of DME;Pain  PT Treatment Interventions DME instruction;Gait training;Stair training;Functional mobility training;Therapeutic activities;Therapeutic exercise;Balance training;Patient/family education   PT Goals (Current goals can be found in the Care Plan section) Acute Rehab PT Goals Patient Stated Goal: Back home and to work PT Goal Formulation: With patient Time For Goal Achievement: 10/07/15 Potential to Achieve Goals: Good    Frequency Min 3X/week   Barriers to discharge Decreased caregiver support;Inaccessible home environment pt lives alone in 2nd floor apartment and only familyis a niece.      Co-evaluation               End of Session Equipment Utilized During Treatment: Gait belt Activity Tolerance: Patient limited by pain Patient left: in bed;with call bell/phone within reach Nurse Communication: Mobility status         Time: [id number] PT Time Calculation (min) (ACUTE ONLY): 15 min   Charges:   PT Evaluation $PT Eval Low Complexity: 1 Procedure     PT G CodesCatarina Hartshorn, Emanuel 09/23/2015, 11:14 AM

## 2015-09-23 NOTE — Discharge Planning (Signed)
Pt to discharge to [facility] house:  [address] [place], [place] [address] [phone]

## 2015-09-23 NOTE — Care Management (Signed)
Talked with patient, states lives at home alone, cares for self and works.  States has to keep working. Will place PT consult to assist with need at home.

## 2015-09-23 NOTE — Clinical Social Work Note (Signed)
Clinical Social Work Assessment  Patient Details  Name: Lucas Fuller MRN: [id number] Date of Birth: [birth year]  Date of referral:  09/23/15               Reason for consult:  Discharge Planning                Permission sought to share information with:  Case Manager, Family Supports Permission granted to share information::     Name::     Alen Blew- Niece [phone]) Johnnette Gourd- Mother [phone])  Agency::  [facility]  Relationship::     Contact Information:     Housing/Transportation Living arrangements for the past 2 months:  [place] of Information:  Case Manager, Patient Patient Interpreter Needed:  None Criminal Activity/Legal Involvement Pertinent to Current Situation/Hospitalization:  No - Comment as needed Significant Relationships:  Siblings, Other Family Members Lives with:  Self Do you feel safe going back to the place where you live?  No Need for family participation in patient care:  No (Coment)  Care giving concerns:  Patient reports that he has been having difficulty walking.   Social Worker assessment / plan:  Patient is 75 YO African American male who presents to the ED with complaints of worsening right groin pain that began Tuesday. Patient reports that he lives alone at this time. Patient reports that he currently works at a [employer]. Patient does not want SNF placement at this time. Patient seen by PT with recommendation of home with home health and 24 hr supervision. Patient seen by case management and agreed to go home with his mother with [place] [facility] home health services. CSW signing off at this time. Please contact if new need(s) arise.   Employment status:  Part-Time Nurse, adult PT Recommendations:  Home with [place] / Referral to community resources:     Patient/Family's Response to care:  Patient reports that he feels comfortable being discharged with  family and home health services. Patient appreciative of care received.   Patient/Family's Understanding of and Emotional Response to Diagnosis, Current Treatment, and Prognosis: Patient able to verbalize strong understanding of his current diagnosis, treatment, and prognosis. He responds in a positive manner. Patient reports strong supports currently in place.   Emotional Assessment Appearance:  Appears younger than stated age Attitude/Demeanor/Rapport:   (Pleasant; Engaging; Cooperative) Affect (typically observed):  Appropriate, Calm, Stable Orientation:  Oriented to Self, Oriented to Place, Oriented to  Time, Oriented to Situation Alcohol / Substance use:  Not Applicable Psych involvement (Current and /or in the community):  No (Comment)  Discharge Needs  Concerns to be addressed:  Home Safety Concerns, Discharge Planning Concerns Readmission within the last 30 days:  Yes Current discharge risk:  Lives alone, Dependent with Mobility Barriers to Discharge:  No Barriers Identified   Judeth Horn, LCSW 09/23/2015, 10:23 AM

## 2015-09-23 NOTE — Discharge Instructions (Signed)
Return here for weakness in your leg, inability to control your bowel or bladder function, or any other problems. Call your doctor tomorrow to schedule a follow-up visit

## 2015-09-23 NOTE — Care Management Note (Addendum)
Case Management Note  Patient Details  Name: Lucas Lucas MRN: [id number] Date of Birth: 1941/05/29  Subjective/Objective:                  75 yo male presents now because he is able to ambulate with the use of crutches.// From home alone.  Action/Plan: Follow for disposition needs; set up home health services and DME.   Expected Discharge Date:     09/23/2015             Expected Discharge Plan:  [place]  In-House Referral:  NA  Discharge planning Services  CM Consult  Post Acute Care Choice:  Home Health, Durable Medical Equipment Choice offered to:  Patient  DME Arranged:  Walker rolling DME Agency:  [place] Arranged:  RN, PT [facility] Agency:  [place]  Status of Service:  Completed, signed off  Medicare Important Message Given:    Date Medicare IM Given:    Medicare IM give by:    Date Additional Medicare IM Given:    Additional Medicare Important Message give by:     If discussed at [place] of Stay Meetings, dates discussed:    Additional Comments: Lucas Lucas J. Clydene Laming, RN, BSN, [employer] [phone] Spoke with pt at bedside regarding discharge planning for [employer] Offered pt list of home health agencies to choose from.  Pt chose [facility] to render services. Lucas Lucas of [facility] notified. DME needs identified at this time include rolling walker.  Lucas Lucas of [facility] notified to bring rolling walker to pt room prior to discharge home today.      Pt to discharge to [facility] house:  [address] [place], [place] [address] [phone]  Lucas Mandril, RN 09/23/2015, 10:02 AM

## 2015-09-23 NOTE — ED Notes (Signed)
MD at bedside.  Pt eating breakfast, waiting for social work to be discharged

## 2015-09-27 DIAGNOSIS — L602 Onychogryphosis: Secondary | ICD-10-CM | POA: Diagnosis not present

## 2015-09-27 DIAGNOSIS — L84 Corns and callosities: Secondary | ICD-10-CM | POA: Diagnosis not present

## 2015-09-27 DIAGNOSIS — M2041 Other hammer toe(s) (acquired), right foot: Secondary | ICD-10-CM | POA: Diagnosis not present

## 2015-09-28 DIAGNOSIS — M150 Primary generalized (osteo)arthritis: Secondary | ICD-10-CM | POA: Diagnosis not present

## 2015-09-28 DIAGNOSIS — M25551 Pain in right hip: Secondary | ICD-10-CM | POA: Diagnosis not present

## 2015-09-28 DIAGNOSIS — M25561 Pain in right knee: Secondary | ICD-10-CM | POA: Diagnosis not present

## 2015-09-28 DIAGNOSIS — M179 Osteoarthritis of knee, unspecified: Secondary | ICD-10-CM | POA: Diagnosis not present

## 2015-10-06 DIAGNOSIS — M25561 Pain in right knee: Secondary | ICD-10-CM | POA: Diagnosis not present

## 2015-10-06 NOTE — Progress Notes (Signed)
Physical Therapy Note  Late entry for missed G-code.      09/23/15 1100  PT Visit Information  Last PT Received On 11/01/15  PT G-Codes **NOT FOR INPATIENT CLASS**  Functional Assessment Tool Used Clinical Judgement  Functional Limitation Mobility: Walking and moving around  Mobility: Walking and Moving Around Current Status [phone]) CI  Mobility: Walking and Moving Around Goal Status [phone]) Barron, Craig

## 2015-10-24 DIAGNOSIS — R29898 Other symptoms and signs involving the musculoskeletal system: Secondary | ICD-10-CM | POA: Diagnosis not present

## 2015-10-24 DIAGNOSIS — M5441 Lumbago with sciatica, right side: Secondary | ICD-10-CM | POA: Diagnosis not present

## 2015-10-25 ENCOUNTER — Other Ambulatory Visit: Payer: Self-pay | Admitting: Internal Medicine

## 2015-10-25 DIAGNOSIS — M5441 Lumbago with sciatica, right side: Secondary | ICD-10-CM

## 2015-10-25 DIAGNOSIS — R29898 Other symptoms and signs involving the musculoskeletal system: Secondary | ICD-10-CM

## 2015-10-30 ENCOUNTER — Other Ambulatory Visit: Payer: Medicare Other

## 2015-11-04 ENCOUNTER — Other Ambulatory Visit: Payer: Medicare Other

## 2015-11-06 ENCOUNTER — Ambulatory Visit
Admission: RE | Admit: 2015-11-06 | Discharge: 2015-11-06 | Disposition: A | Discharge status: Home / Self Care | Payer: Medicare Other | Source: Ambulatory Visit | Attending: Internal Medicine | Admitting: Internal Medicine

## 2015-11-06 DIAGNOSIS — M5441 Lumbago with sciatica, right side: Secondary | ICD-10-CM

## 2015-11-06 DIAGNOSIS — M4806 Spinal stenosis, lumbar region: Secondary | ICD-10-CM | POA: Diagnosis not present

## 2015-11-06 DIAGNOSIS — R29898 Other symptoms and signs involving the musculoskeletal system: Secondary | ICD-10-CM

## 2015-11-07 DIAGNOSIS — E039 Hypothyroidism, unspecified: Secondary | ICD-10-CM | POA: Diagnosis not present

## 2015-11-07 DIAGNOSIS — E782 Mixed hyperlipidemia: Secondary | ICD-10-CM | POA: Diagnosis not present

## 2015-11-07 DIAGNOSIS — M5136 Other intervertebral disc degeneration, lumbar region: Secondary | ICD-10-CM | POA: Diagnosis not present

## 2015-11-15 DIAGNOSIS — M549 Dorsalgia, unspecified: Secondary | ICD-10-CM | POA: Diagnosis not present

## 2015-11-15 DIAGNOSIS — M5136 Other intervertebral disc degeneration, lumbar region: Secondary | ICD-10-CM | POA: Diagnosis not present

## 2015-11-15 DIAGNOSIS — M5126 Other intervertebral disc displacement, lumbar region: Secondary | ICD-10-CM | POA: Diagnosis not present

## 2015-11-15 DIAGNOSIS — M546 Pain in thoracic spine: Secondary | ICD-10-CM | POA: Diagnosis not present

## 2015-11-29 DIAGNOSIS — R0602 Shortness of breath: Secondary | ICD-10-CM | POA: Diagnosis not present

## 2015-11-29 DIAGNOSIS — R5383 Other fatigue: Secondary | ICD-10-CM | POA: Diagnosis not present

## 2015-12-05 ENCOUNTER — Inpatient Hospital Stay (HOSPITAL_COMMUNITY)
Admission: EM | Admit: 2015-12-05 | Discharge: 2015-12-09 | DRG: 292 | Disposition: A | Discharge status: Home / Self Care | Payer: Medicare Other | Attending: Internal Medicine | Admitting: Internal Medicine

## 2015-12-05 ENCOUNTER — Inpatient Hospital Stay (HOSPITAL_COMMUNITY): Payer: Medicare Other

## 2015-12-05 ENCOUNTER — Emergency Department (HOSPITAL_COMMUNITY): Payer: Medicare Other

## 2015-12-05 ENCOUNTER — Encounter (HOSPITAL_COMMUNITY): Payer: Self-pay

## 2015-12-05 DIAGNOSIS — R799 Abnormal finding of blood chemistry, unspecified: Secondary | ICD-10-CM | POA: Diagnosis not present

## 2015-12-05 DIAGNOSIS — F1020 Alcohol dependence, uncomplicated: Secondary | ICD-10-CM | POA: Diagnosis present

## 2015-12-05 DIAGNOSIS — R609 Edema, unspecified: Secondary | ICD-10-CM

## 2015-12-05 DIAGNOSIS — I350 Nonrheumatic aortic (valve) stenosis: Secondary | ICD-10-CM | POA: Diagnosis not present

## 2015-12-05 DIAGNOSIS — I429 Cardiomyopathy, unspecified: Secondary | ICD-10-CM | POA: Insufficient documentation

## 2015-12-05 DIAGNOSIS — Z886 Allergy status to analgesic agent status: Secondary | ICD-10-CM

## 2015-12-05 DIAGNOSIS — F1010 Alcohol abuse, uncomplicated: Secondary | ICD-10-CM

## 2015-12-05 DIAGNOSIS — R079 Chest pain, unspecified: Secondary | ICD-10-CM | POA: Diagnosis not present

## 2015-12-05 DIAGNOSIS — R0602 Shortness of breath: Secondary | ICD-10-CM | POA: Diagnosis present

## 2015-12-05 DIAGNOSIS — R634 Abnormal weight loss: Secondary | ICD-10-CM

## 2015-12-05 DIAGNOSIS — R748 Abnormal levels of other serum enzymes: Secondary | ICD-10-CM | POA: Diagnosis present

## 2015-12-05 DIAGNOSIS — I071 Rheumatic tricuspid insufficiency: Secondary | ICD-10-CM | POA: Diagnosis present

## 2015-12-05 DIAGNOSIS — I5031 Acute diastolic (congestive) heart failure: Secondary | ICD-10-CM | POA: Insufficient documentation

## 2015-12-05 DIAGNOSIS — I5041 Acute combined systolic (congestive) and diastolic (congestive) heart failure: Secondary | ICD-10-CM | POA: Diagnosis not present

## 2015-12-05 DIAGNOSIS — R0600 Dyspnea, unspecified: Secondary | ICD-10-CM

## 2015-12-05 DIAGNOSIS — I5040 Unspecified combined systolic (congestive) and diastolic (congestive) heart failure: Secondary | ICD-10-CM

## 2015-12-05 DIAGNOSIS — M519 Unspecified thoracic, thoracolumbar and lumbosacral intervertebral disc disorder: Secondary | ICD-10-CM

## 2015-12-05 DIAGNOSIS — E440 Moderate protein-calorie malnutrition: Secondary | ICD-10-CM | POA: Insufficient documentation

## 2015-12-05 DIAGNOSIS — E46 Unspecified protein-calorie malnutrition: Secondary | ICD-10-CM

## 2015-12-05 DIAGNOSIS — I5021 Acute systolic (congestive) heart failure: Secondary | ICD-10-CM

## 2015-12-05 DIAGNOSIS — Z8551 Personal history of malignant neoplasm of bladder: Secondary | ICD-10-CM

## 2015-12-05 DIAGNOSIS — Z6823 Body mass index (BMI) 23.0-23.9, adult: Secondary | ICD-10-CM

## 2015-12-05 DIAGNOSIS — I4892 Unspecified atrial flutter: Secondary | ICD-10-CM

## 2015-12-05 DIAGNOSIS — I493 Ventricular premature depolarization: Secondary | ICD-10-CM | POA: Diagnosis not present

## 2015-12-05 DIAGNOSIS — I4891 Unspecified atrial fibrillation: Secondary | ICD-10-CM | POA: Diagnosis not present

## 2015-12-05 DIAGNOSIS — E038 Other specified hypothyroidism: Secondary | ICD-10-CM | POA: Diagnosis not present

## 2015-12-05 DIAGNOSIS — I509 Heart failure, unspecified: Secondary | ICD-10-CM

## 2015-12-05 DIAGNOSIS — R7989 Other specified abnormal findings of blood chemistry: Secondary | ICD-10-CM | POA: Diagnosis not present

## 2015-12-05 DIAGNOSIS — Z79899 Other long term (current) drug therapy: Secondary | ICD-10-CM

## 2015-12-05 DIAGNOSIS — I480 Paroxysmal atrial fibrillation: Secondary | ICD-10-CM | POA: Diagnosis not present

## 2015-12-05 DIAGNOSIS — I5022 Chronic systolic (congestive) heart failure: Secondary | ICD-10-CM | POA: Diagnosis not present

## 2015-12-05 DIAGNOSIS — J449 Chronic obstructive pulmonary disease, unspecified: Secondary | ICD-10-CM | POA: Diagnosis not present

## 2015-12-05 DIAGNOSIS — E039 Hypothyroidism, unspecified: Secondary | ICD-10-CM | POA: Diagnosis not present

## 2015-12-05 DIAGNOSIS — D696 Thrombocytopenia, unspecified: Secondary | ICD-10-CM

## 2015-12-05 DIAGNOSIS — I481 Persistent atrial fibrillation: Secondary | ICD-10-CM | POA: Diagnosis not present

## 2015-12-05 DIAGNOSIS — I483 Typical atrial flutter: Secondary | ICD-10-CM | POA: Diagnosis not present

## 2015-12-05 HISTORY — DX: Thrombocytopenia, unspecified: D69.6

## 2015-12-05 HISTORY — DX: Heart failure, unspecified: I50.9

## 2015-12-05 HISTORY — DX: Edema, unspecified: R60.9

## 2015-12-05 HISTORY — DX: Unspecified atrial flutter: I48.92

## 2015-12-05 HISTORY — DX: Unspecified thoracic, thoracolumbar and lumbosacral intervertebral disc disorder: M51.9

## 2015-12-05 HISTORY — DX: Shortness of breath: R06.02

## 2015-12-05 HISTORY — DX: Unspecified protein-calorie malnutrition: E46

## 2015-12-05 HISTORY — DX: Abnormal weight loss: R63.4

## 2015-12-05 HISTORY — DX: Alcohol abuse, uncomplicated: F10.10

## 2015-12-05 LAB — I-STAT TROPONIN, ED
TROPONIN I, POC: 0.08 ng/mL (ref 0.00–0.08)
Troponin i, poc: 0.08 ng/mL (ref 0.00–0.08)

## 2015-12-05 LAB — COMPREHENSIVE METABOLIC PANEL
ALT: 36 U/L (ref 17–63)
ANION GAP: 8 (ref 5–15)
AST: 41 U/L (ref 15–41)
Albumin: 3.2 g/dL — ABNORMAL LOW (ref 3.5–5.0)
Alkaline Phosphatase: 68 U/L (ref 38–126)
BUN: 15 mg/dL (ref 6–20)
CO2: 26 mmol/L (ref 22–32)
Calcium: 8.8 mg/dL — ABNORMAL LOW (ref 8.9–10.3)
Chloride: 106 mmol/L (ref 101–111)
Creatinine, Ser: 1.2 mg/dL (ref 0.61–1.24)
GFR calc Af Amer: >60 mL/min (ref >60)
GFR calc non Af Amer: 57 mL/min — ABNORMAL LOW (ref >60)
GLUCOSE: 105 mg/dL — AB (ref 65–99)
Potassium: 3.8 mmol/L (ref 3.5–5.1)
Sodium: 140 mmol/L (ref 135–145)
TOTAL PROTEIN: 5.8 g/dL — AB (ref 6.5–8.1)
Total Bilirubin: 1 mg/dL (ref 0.3–1.2)

## 2015-12-05 LAB — CBC
HEMATOCRIT: 42.7 % (ref 39.0–52.0)
HEMOGLOBIN: 14.7 g/dL (ref 13.0–17.0)
MCH: 34 pg (ref 26.0–34.0)
MCHC: 34.4 g/dL (ref 30.0–36.0)
MCV: 98.8 fL (ref 78.0–100.0)
Platelets: 122 10*3/uL — ABNORMAL LOW (ref 150–400)
RBC: 4.32 MIL/uL (ref 4.22–5.81)
RDW: 14.6 % (ref 11.5–15.5)
WBC: 8.3 10*3/uL (ref 4.0–10.5)

## 2015-12-05 LAB — PREALBUMIN: Prealbumin: 16.9 mg/dL — ABNORMAL LOW (ref 18–38)

## 2015-12-05 LAB — PROTIME-INR
INR: 1.29 (ref 0.00–1.49)
Prothrombin Time: 16.3 seconds — ABNORMAL HIGH (ref 11.6–15.2)

## 2015-12-05 LAB — MAGNESIUM: Magnesium: 1.9 mg/dL (ref 1.7–2.4)

## 2015-12-05 LAB — ECHOCARDIOGRAM COMPLETE
HEIGHTINCHES: 67.5 in
Weight: 2560 oz

## 2015-12-05 LAB — BRAIN NATRIURETIC PEPTIDE: B Natriuretic Peptide: 971 pg/mL — ABNORMAL HIGH (ref 0.0–100.0)

## 2015-12-05 MED ORDER — METOPROLOL TARTRATE 5 MG/5ML IV SOLN
5.0000 mg | Freq: Once | INTRAVENOUS | Status: AC
  Administered 2015-12-05: 5 mg via INTRAVENOUS
  Filled 2015-12-05: qty 5

## 2015-12-05 MED ORDER — SODIUM CHLORIDE 0.9% FLUSH
3.0000 mL | Freq: Two times a day (BID) | INTRAVENOUS | Status: DC
  Administered 2015-12-05 – 2015-12-08 (×8): 3 mL via INTRAVENOUS

## 2015-12-05 MED ORDER — VITAMIN B-1 100 MG PO TABS
100.0000 mg | ORAL_TABLET | Freq: Every day | ORAL | Status: DC
  Administered 2015-12-05 – 2015-12-09 (×5): 100 mg via ORAL
  Filled 2015-12-05 (×5): qty 1

## 2015-12-05 MED ORDER — METHOCARBAMOL 500 MG PO TABS
500.0000 mg | ORAL_TABLET | Freq: Two times a day (BID) | ORAL | Status: DC
  Administered 2015-12-05 – 2015-12-09 (×8): 500 mg via ORAL
  Filled 2015-12-05 (×8): qty 1

## 2015-12-05 MED ORDER — THIAMINE HCL 100 MG/ML IJ SOLN: 100.0000 mg | Freq: Every day | INTRAMUSCULAR | Status: DC

## 2015-12-05 MED ORDER — GABAPENTIN 100 MG PO CAPS
200.0000 mg | ORAL_CAPSULE | Freq: Three times a day (TID) | ORAL | Status: DC
  Administered 2015-12-05 – 2015-12-09 (×11): 200 mg via ORAL
  Filled 2015-12-05 (×13): qty 2

## 2015-12-05 MED ORDER — ADULT MULTIVITAMIN W/MINERALS CH
1.0000 | ORAL_TABLET | Freq: Every day | ORAL | Status: DC
  Administered 2015-12-05 – 2015-12-09 (×4): 1 via ORAL
  Filled 2015-12-05 (×5): qty 1

## 2015-12-05 MED ORDER — LORAZEPAM 1 MG PO TABS: 1.0000 mg | ORAL_TABLET | Freq: Four times a day (QID) | ORAL | Status: AC | PRN

## 2015-12-05 MED ORDER — LORAZEPAM 2 MG/ML IJ SOLN: 1.0000 mg | Freq: Four times a day (QID) | INTRAMUSCULAR | Status: AC | PRN

## 2015-12-05 MED ORDER — ACETAMINOPHEN 325 MG PO TABS
650.0000 mg | ORAL_TABLET | ORAL | Status: DC | PRN
  Administered 2015-12-06: 650 mg via ORAL
  Filled 2015-12-05: qty 2

## 2015-12-05 MED ORDER — FOLIC ACID 1 MG PO TABS
1.0000 mg | ORAL_TABLET | Freq: Every day | ORAL | Status: DC
  Administered 2015-12-05 – 2015-12-09 (×5): 1 mg via ORAL
  Filled 2015-12-05 (×5): qty 1

## 2015-12-05 MED ORDER — ENOXAPARIN SODIUM 40 MG/0.4ML ~~LOC~~ SOLN
40.0000 mg | SUBCUTANEOUS | Status: DC
  Administered 2015-12-05 – 2015-12-07 (×3): 40 mg via SUBCUTANEOUS
  Filled 2015-12-05 (×3): qty 0.4

## 2015-12-05 MED ORDER — FUROSEMIDE 10 MG/ML IJ SOLN: 40.0000 mg | Freq: Two times a day (BID) | INTRAMUSCULAR | Status: DC

## 2015-12-05 MED ORDER — ASPIRIN 81 MG PO CHEW
324.0000 mg | CHEWABLE_TABLET | Freq: Once | ORAL | Status: DC
  Filled 2015-12-05: qty 4

## 2015-12-05 MED ORDER — SODIUM CHLORIDE 0.9% FLUSH: 3.0000 mL | INTRAVENOUS | Status: DC | PRN

## 2015-12-05 MED ORDER — FUROSEMIDE 10 MG/ML IJ SOLN
40.0000 mg | Freq: Once | INTRAMUSCULAR | Status: AC
  Administered 2015-12-05: 40 mg via INTRAVENOUS
  Filled 2015-12-05: qty 4

## 2015-12-05 MED ORDER — SODIUM CHLORIDE 0.9 % IV SOLN: 250.0000 mL | INTRAVENOUS | Status: DC | PRN

## 2015-12-05 MED ORDER — POTASSIUM CHLORIDE CRYS ER 20 MEQ PO TBCR
20.0000 meq | EXTENDED_RELEASE_TABLET | Freq: Every day | ORAL | Status: DC
  Administered 2015-12-05 – 2015-12-09 (×4): 20 meq via ORAL
  Filled 2015-12-05 (×5): qty 1

## 2015-12-05 MED ORDER — LEVOTHYROXINE SODIUM 50 MCG PO TABS
50.0000 ug | ORAL_TABLET | Freq: Every day | ORAL | Status: DC
  Administered 2015-12-06 – 2015-12-09 (×4): 50 ug via ORAL
  Filled 2015-12-05 (×4): qty 1

## 2015-12-05 MED ORDER — FUROSEMIDE 10 MG/ML IJ SOLN
20.0000 mg | Freq: Two times a day (BID) | INTRAMUSCULAR | Status: DC
  Administered 2015-12-05 – 2015-12-09 (×8): 20 mg via INTRAVENOUS
  Filled 2015-12-05 (×8): qty 2

## 2015-12-05 MED ORDER — ONDANSETRON HCL 4 MG/2ML IJ SOLN: 4.0000 mg | Freq: Four times a day (QID) | INTRAMUSCULAR | Status: DC | PRN

## 2015-12-05 NOTE — Progress Notes (Signed)
  Echocardiogram 2D Echocardiogram has been performed.  Lucas Fuller 12/05/2015, 3:44 PM

## 2015-12-05 NOTE — ED Notes (Signed)
Meal tray ordered and to be sent to [facility]

## 2015-12-05 NOTE — ED Provider Notes (Signed)
CSN: [id number]     Arrival date & time 12/05/15  0847 History   First MD Initiated Contact with Patient 12/05/15 [phone]     Chief Complaint  Patient presents with  . Shortness of Breath     (Consider location/radiation/quality/duration/timing/severity/associated sxs/prior Treatment) Patient is a 75 y.o. male presenting with general illness. The history is provided by the patient.  Illness Location:  Shortness of breath Severity:  Moderate Onset quality:  Gradual Duration:  2 weeks Timing:  Constant Progression:  Unchanged Chronicity:  New Context:  Shortness of breath, getting progressively worse over the last 2 weeks. Some left-sided chest pain as well. Unable to describe the chest pain. No fevers and chills. No infectious symptoms. Bilateral lower extremity swelling for 1 week. Seen by his primary care doctor. Started on a multivitamin. Associated symptoms: chest pain, cough, fatigue and shortness of breath   Associated symptoms: no abdominal pain, no diarrhea, no fever, no headaches, no nausea, no vomiting and no wheezing     Past Medical History  Diagnosis Date  . Thyroid disease   . Bladder cancer [facility])    Past Surgical History  Procedure Laterality Date  . Bladder surgery     History reviewed. No pertinent family history. Social History  Substance Use Topics  . Smoking status: Never Smoker   . Smokeless tobacco: None  . Alcohol Use: Yes     Comment: 2 or 3 shots a day    Review of Systems  Constitutional: Positive for activity change and fatigue. Negative for fever and chills.  Respiratory: Positive for cough and shortness of breath. Negative for wheezing.   Cardiovascular: Positive for chest pain, palpitations and leg swelling.  Gastrointestinal: Negative for nausea, vomiting, abdominal pain, diarrhea and blood in stool.  Musculoskeletal: Negative for back pain.  Neurological: Negative for light-headedness and headaches.  All other systems reviewed and are  negative.     Allergies  Asa  Home Medications   Prior to Admission medications   Medication Sig Start Date End Date Taking? Authorizing Provider  gabapentin (NEURONTIN) 100 MG capsule Take 200 mg by mouth 3 (three) times daily.   Yes Historical Provider, MD  levothyroxine (SYNTHROID, LEVOTHROID) 50 MCG tablet Take 50 mcg by mouth daily before breakfast.   Yes Historical Provider, MD  methocarbamol (ROBAXIN) 500 MG tablet Take 1 tablet (500 mg total) by mouth 2 (two) times daily. 09/23/15  Yes Lacretia Leigh, MD  oxyCODONE-acetaminophen (PERCOCET/ROXICET) 5-325 MG tablet Take 1-2 tablets by mouth every 4 (four) hours as needed for severe pain. Patient not taking: Reported on 12/05/2015 09/23/15   Lacretia Leigh, MD  predniSONE (DELTASONE) 20 MG tablet Take 2 tablets (40 mg total) by mouth daily. Patient not taking: Reported on 12/05/2015 09/23/15   Lacretia Leigh, MD   BP 126/112 mmHg  Pulse 93  Temp(Src) 97.9 F (36.6 C) (Oral)  Resp 32  Ht 5' 7.5" (1.715 m)  Wt 72.576 kg  BMI 24.68 kg/m2  SpO2 98% Physical Exam  Constitutional: He is oriented to person, place, and time. He appears well-developed and well-nourished. No distress.  HENT:  Head: Normocephalic and atraumatic.  Eyes: EOM are normal. Pupils are equal, round, and reactive to light.  Cardiovascular: Regular rhythm and intact distal pulses.  Tachycardia present.   Pulmonary/Chest: Effort normal. Tachypnea noted. No respiratory distress. He has no wheezes. He has no rhonchi. He has rales.  Abdominal: Soft. There is no tenderness.  Musculoskeletal:       Right lower  leg: He exhibits swelling and edema.       Left lower leg: He exhibits swelling and edema.  Neurological: He is alert and oriented to person, place, and time.  Skin: Skin is warm and dry.    ED Course  Procedures (including critical care time) Labs Review Labs Reviewed  COMPREHENSIVE METABOLIC PANEL - Abnormal; Notable for the following:    Glucose, Bld 105  (*)    Calcium 8.8 (*)    Total Protein 5.8 (*)    Albumin 3.2 (*)    GFR calc non Af Amer 57 (*)    All other components within normal limits  CBC - Abnormal; Notable for the following:    Platelets 122 (*)    All other components within normal limits  BRAIN NATRIURETIC PEPTIDE - Abnormal; Notable for the following:    B Natriuretic Peptide 971.0 (*)    All other components within normal limits  I-STAT TROPOININ, ED    Imaging Review Dg Chest 2 View  12/05/2015  CLINICAL DATA:  Shortness of breath for 2 weeks EXAM: CHEST  2 VIEW COMPARISON:  09/21/2015 FINDINGS: Cardiac shadow is mildly enlarged. Small bilateral pleural effusions are noted as well as bibasilar atelectasis. No acute bony abnormality is seen. IMPRESSION: Small bilateral pleural effusions and bibasilar atelectasis. Electronically Signed   By: Inez Catalina M.D.   On: 12/05/2015 10:00   I have personally reviewed and evaluated these images and lab results as part of my medical decision-making.   EKG Interpretation   Date/Time:  Monday Dec 05 2015 08:56:29 EDT Ventricular Rate:  116 PR Interval:    QRS Duration: 102 QT Interval:  339 QTC Calculation: 471 R Axis:   -84 Text Interpretation:  Atrial flutter Ventricular premature complex Left  anterior fascicular block Abnormal R-wave progression, late transition  Nonspecific repol abnormality, lateral leads ST elevation, consider  inferior injury Confirmed by [facility] MD, Corene Cornea [phone]) on 12/05/2015 9:00:41  AM Also confirmed by [facility] MD, Corene Cornea [phone]), editor Rolla Plate, Joelene Millin  [phone])  on 12/05/2015 9:37:24 AM      MDM   Final diagnoses:  Atrial flutter, unspecified type ([place])  SOB (shortness of breath)  Elevated brain natriuretic peptide (BNP) level    New diagnosis of AFLUTTER. Tachycardia into low 100s. Hemodynamically stable. Gave a dose of metoprlol. EKG without obvious ischemic changes, First trop negative. Got ASA. Will need to trend trops.  Likely new  diagnosis of CHF as well. BNP elevated in setting of pulmonary edema and BLE swelling. Gave lasix. Will need to monitor output.   No f/c. No evidence of PNA on CXR. Sx not c/w PE or aortic dissection.  Patient will be admitted to hospitalist service.  Maryan Puls, MD 12/05/15 1716  Merrily Pew, MD 12/06/15 [phone]

## 2015-12-05 NOTE — ED Notes (Signed)
Pt here reporting shortness of breath that started 2 weeks ago. Pt reports having to get up in the middle of the night to catch his breath. Pt also reports bilateral swelling of the feet. Pt tachypneic on assessment.

## 2015-12-05 NOTE — Consult Note (Signed)
Reason for Consult: a fib   Referring Physician: Dr. Marily Memos   PCP:  Merrilee Seashore, MD  Primary Cardiologist: new  Lucas Fuller is an 75 y.o. male.    Chief Complaint:  Admitted 12/05/15 with SOB and leg edema.    HPI: Asked to see 75 year old male with hx bladder cancer and hypothyroidism and presented with 1 weeks hx of dyspnea and bil. LExt. Edema.   EKG with a flutter.  Rate 116. + PVC.  Denies much chest pain some chest pressure at times.  CHA2DS2VASc  Is 4 going by CT head with stroke.  Hx GI bleeding and ETOH abuse also with thrombocytopenia plts  122.   Troponin 0.08 X 2  BNP 971  CXR with sm. bil effusions.   Poor historian.    Past Medical History  Diagnosis Date  . Thyroid disease   . Bladder cancer [facility])     Past Surgical History  Procedure Laterality Date  . Bladder surgery      Family History  Problem Relation Age of Onset  . Kidney disease Sister    Social History:  reports that he has never smoked. He does not have any smokeless tobacco history on file. He reports that he drinks alcohol. He reports that he does not use illicit drugs.  Allergies:  Allergies  Allergen Reactions  . Asa [Aspirin] Hives    OUTPATIENT MEDICATIONS: No current facility-administered medications on file prior to encounter.   Current Outpatient Prescriptions on File Prior to Encounter  Medication Sig Dispense Refill  . gabapentin (NEURONTIN) 100 MG capsule Take 200 mg by mouth 3 (three) times daily.    Marland Kitchen levothyroxine (SYNTHROID, LEVOTHROID) 50 MCG tablet Take 50 mcg by mouth daily before breakfast.    . methocarbamol (ROBAXIN) 500 MG tablet Take 1 tablet (500 mg total) by mouth 2 (two) times daily. 20 tablet 0  . oxyCODONE-acetaminophen (PERCOCET/ROXICET) 5-325 MG tablet Take 1-2 tablets by mouth every 4 (four) hours as needed for severe pain. (Patient not taking: Reported on 12/05/2015) 12 tablet 0  . predniSONE (DELTASONE) 20 MG tablet Take 2 tablets (40 mg  total) by mouth daily. (Patient not taking: Reported on 12/05/2015) 10 tablet 0     Current Medications:  Scheduled Meds: . enoxaparin (LOVENOX) injection  40 mg Subcutaneous Q24H  . folic acid  1 mg Oral Daily  . furosemide  20 mg Intravenous BID  . gabapentin  200 mg Oral TID  . [START ON 12/06/2015] levothyroxine  50 mcg Oral QAC breakfast  . methocarbamol  500 mg Oral BID  . multivitamin with minerals  1 tablet Oral Daily  . potassium chloride  20 mEq Oral Daily  . sodium chloride flush  3 mL Intravenous Q12H  . thiamine  100 mg Oral Daily   Or  . thiamine  100 mg Intravenous Daily   Continuous Infusions:  PRN Meds:.sodium chloride, acetaminophen, LORazepam **OR** LORazepam, ondansetron (ZOFRAN) IV, sodium chloride flush  Results for orders placed or performed during the hospital encounter of 12/05/15 (from the past 48 hour(s))  Comprehensive metabolic panel     Status: Abnormal   Collection Time: 12/05/15  9:33 AM  Result Value Ref Range   Sodium 140 135 - 145 mmol/L   Potassium 3.8 3.5 - 5.1 mmol/L   Chloride 106 101 - 111 mmol/L   CO2 26 22 - 32 mmol/L   Glucose, Bld 105 (H) 65 - 99 mg/dL  BUN 15 6 - 20 mg/dL   Creatinine, Ser 1.20 0.61 - 1.24 mg/dL   Calcium 8.8 (L) 8.9 - 10.3 mg/dL   Total Protein 5.8 (L) 6.5 - 8.1 g/dL   Albumin 3.2 (L) 3.5 - 5.0 g/dL   AST 41 15 - 41 U/L   ALT 36 17 - 63 U/L   Alkaline Phosphatase 68 38 - 126 U/L   Total Bilirubin 1.0 0.3 - 1.2 mg/dL   GFR calc non Af Amer 57 (L) >60 mL/min   GFR calc Af Amer >60 >60 mL/min    Comment: (NOTE) The eGFR has been calculated using the CKD EPI equation. This calculation has not been validated in all clinical situations. eGFR's persistently <60 mL/min signify possible Chronic Kidney Disease.    Anion gap 8 5 - 15  CBC     Status: Abnormal   Collection Time: 12/05/15  9:33 AM  Result Value Ref Range   WBC 8.3 4.0 - 10.5 K/uL   RBC 4.32 4.22 - 5.81 MIL/uL   Hemoglobin 14.7 13.0 - 17.0 g/dL    HCT 42.7 39.0 - 52.0 %   MCV 98.8 78.0 - 100.0 fL   MCH 34.0 26.0 - 34.0 pg   MCHC 34.4 30.0 - 36.0 g/dL   RDW 14.6 11.5 - 15.5 %   Platelets 122 (L) 150 - 400 K/uL  Brain natriuretic peptide (order if patient c/o SOB ONLY)     Status: Abnormal   Collection Time: 12/05/15  9:33 AM  Result Value Ref Range   B Natriuretic Peptide 971.0 (H) 0.0 - 100.0 pg/mL  I-stat troponin, ED (0, 3, 6)  not at [facility]     Status: None   Collection Time: 12/05/15  9:41 AM  Result Value Ref Range   Troponin i, poc 0.08 0.00 - 0.08 ng/mL   Comment 3            Comment: Due to the release kinetics of cTnI, a negative result within the first hours of the onset of symptoms does not rule out myocardial infarction with certainty. If myocardial infarction is still suspected, repeat the test at appropriate intervals.   I-stat troponin, ED (0, 3, 6)  not at [facility]     Status: None   Collection Time: 12/05/15 12:08 PM  Result Value Ref Range   Troponin i, poc 0.08 0.00 - 0.08 ng/mL   Comment 3            Comment: Due to the release kinetics of cTnI, a negative result within the first hours of the onset of symptoms does not rule out myocardial infarction with certainty. If myocardial infarction is still suspected, repeat the test at appropriate intervals.    Dg Chest 2 View  12/05/2015  CLINICAL DATA:  Shortness of breath for 2 weeks EXAM: CHEST  2 VIEW COMPARISON:  09/21/2015 FINDINGS: Cardiac shadow is mildly enlarged. Small bilateral pleural effusions are noted as well as bibasilar atelectasis. No acute bony abnormality is seen. IMPRESSION: Small bilateral pleural effusions and bibasilar atelectasis. Electronically Signed   By: Inez Catalina M.D.   On: 12/05/2015 10:00    ROS: General:no colds or fevers, + weight loss of 30 lbs over 8 months  Skin:no rashes or ulcers HEENT:no blurred vision, no congestion CV:see HPI PUL:see HPI GI:no diarrhea constipation or melena, no indigestion GU:no  hematuria, no dysuria MS:no joint pain, no claudication Neuro:no syncope, no lightheadedness Endo:no diabetes, + thyroid disease   Blood pressure  145/101, pulse 83, temperature 97.5 F (36.4 C), temperature source Oral, resp. rate 22, height 5' 7.5" (1.715 m), weight 160 lb (72.576 kg), SpO2 97 %.  Wt Readings from Last 3 Encounters:  12/05/15 160 lb (72.576 kg)  09/22/15 157 lb (71.215 kg)    PE: General:Pleasant affect, NAD Skin:Warm and dry, brisk capillary refill HEENT:normocephalic, sclera clear, mucus membranes moist Neck:supple, + JVD, no bruits  Heart:irreg irreg without murmur, gallup, rub or click Lungs:diminished without rales, rhonchi, or wheezes GOV:PCHE, non tender, + BS, do not palpate liver spleen or masses Ext:1-2+ lower ext edema,1+ pedal pulses, 2+ radial pulses Neuro:alert and oriented X 3, MAE, follows commands, + facial symmetry Tele: a fib rate controlled now  Assessment/Plan Active Problems:   SOB (shortness of breath)   Atrial flutter (HCC)   Edema   Hypothyroidism   ETOH abuse   Thrombocytopenia (HCC)   Weight loss   Lumbar disc disease   Protein calorie malnutrition (HCC)   Heart failure (HCC)   Elevated brain natriuretic peptide (BNP) level  A fib  CHA2DS2VASc score of 4 - CVA on CT head.  But concerning with hx of GI bleed and ETOH use.  Will discuss with Dr. Angelena Form on anticoagulation.  Rate is controlled currently. ? Add low dose dilt or BB? Most likely high risk for anticoagulation.     CHF  Echo pending  Only complains of SOB for 2 weeks.  Diuresis  Now on lasix 20 mg BID. Unsure how much he has put out - goes to BR and does not measure.   Wt loss over 8 months but SOB only for 2 weeks.  ? Recurrent cancer.  Cecilie Kicks  Nurse Practitioner Certified [place] Pager [phone] or after 5pm or weekends call [phone] 12/05/2015, 1:56 PM     I have personally seen and examined this patient. I agree with the  assessment and plan as outlined above. He is admitted with volume overload. Exam shows 2+ bilateral lower ext edema. He has + JVD. His heart is irregular. Abdomen is soft. Labs reviewed. I agree with getting an echo today to assess his LVEF. He is being diuresed. He has not been recording his I/Os. Will follow strict I/O and if diuresis is not adequate, would move lasix dosage up tomorrow. Will make further plans based on echo results. In regards to his atrial fibrillation, he is rate controlled. He seems to be a very poor candidate for anti-coagulation given his prior GI bleeding and ongoing alcohol abuse.   Lauree Chandler 12/05/2015 3:25 PM

## 2015-12-05 NOTE — H&P (Signed)
History and Physical    STEWART BRUZEK [id number] DOB: May 27, 1941 DOA: 12/05/2015  PCP: Merrilee Seashore, MD  Patient coming from:  Home  Chief Complaint: SOB and swelling of legs  HPI: Lucas Fuller is a 75 y.o. male with medical history significant for history of bladder cancer and hypothyroidism.  Patient presents to the emergency department with a one-week history of dyspnea and bilateral lower edema. Patient states symptoms came on suddenly. Patient sleeps on 2 pillows at night but this is not something he recently started . No coughing . No chest pain . No history of smoking . He denies previous history of cardiac disease.In ED he been found to be in atrial flutter. Monitor currently reading A. Fib right now. No previous history of cardiac arrhythmias. Patient reports 30 pound weight loss of the last 8 months. His appetite is okay, mainly doesn't have time to eat because of work. No nausea. Bowel movements are unremarkable. Reports decreased urinary output.   ED Course:  ASA Trop 0.08, BNP 971 Normal hgb Plate 122 (chronic) Given lasix 40mg  IV and Lopressor 5mg  IV   EKG Interpretation  Date/Time:  Monday Dec 05 2015 08:56:29 EDT Ventricular Rate:  116 PR Interval:    QRS Duration: 102 QT Interval:  339 QTC Calculation: 471 R Axis:   -84 Text Interpretation:  Atrial flutter Ventricular premature complex Left anterior fascicular block Abnormal R-wave progression, late transition Nonspecific repol abnormality, lateral leads ST elevation, consider inferior injury Confirmed by [facility] MD, Corene Cornea [phone]) on 12/05/2015 9:00:41 AM Also confirmed by [facility] MD, Corene Cornea [phone]), editor Rolla Plate, Joelene Millin [phone])  on 12/05/2015 9:37:24 AM     Review of Systems: As per HPI, otherwise 10 point review of systems negative.    Past Medical History  Diagnosis Date  . Thyroid disease   . Bladder cancer [facility])     Past Surgical History  Procedure Laterality Date  . Bladder surgery      Social Hx: lives at home with mother No assistive devices needed for ambulation.    reports that he has never smoked. He does not have any smokeless tobacco history on file. He reports that he drinks alcohol. He reports that he does not use illicit drugs.  Allergies  Allergen Reactions  . Asa [Aspirin] Hives    [place]: Multiple deceased siblings but the only [facility] patient aware of is ESRD in a sister  Prior to Admission medications   Medication Sig Start Date End Date Taking? Authorizing Provider  gabapentin (NEURONTIN) 100 MG capsule Take 200 mg by mouth 3 (three) times daily.   Yes Historical Provider, MD  levothyroxine (SYNTHROID, LEVOTHROID) 50 MCG tablet Take 50 mcg by mouth daily before breakfast.   Yes Historical Provider, MD  methocarbamol (ROBAXIN) 500 MG tablet Take 1 tablet (500 mg total) by mouth 2 (two) times daily. 09/23/15  Yes Lacretia Leigh, MD  oxyCODONE-acetaminophen (PERCOCET/ROXICET) 5-325 MG tablet Take 1-2 tablets by mouth every 4 (four) hours as needed for severe pain. Patient not taking: Reported on 12/05/2015 09/23/15   Lacretia Leigh, MD    Physical Exam: Filed Vitals:   12/05/15 0900 12/05/15 0915 12/05/15 0930 12/05/15 1015  BP: 135/83 125/94 122/91 141/96  Pulse: 51 60 60 95  Temp:      TempSrc:      Resp: 28 19 25 21   Height:      Weight:      SpO2: 98% 98% 96% 97%   Constitutional:  Emaciated black male in NAD,  calm, comfortable Filed Vitals:   12/05/15 0900 12/05/15 0915 12/05/15 0930 12/05/15 1015  BP: 135/83 125/94 122/91 141/96  Pulse: 51 60 60 95  Temp:      TempSrc:      Resp: 28 19 25 21   Height:      Weight:      SpO2: 98% 98% 96% 97%   Eyes: PER, lids and conjunctivae normal ENMT: Mucous membranes are moist. Posterior pharynx clear of any exudate or lesions.Normal dentition.  Neck: normal, supple, no masses Respiratory: clear to auscultation bilaterally, no wheezing, no crackles. Normal respiratory effort. No accessory muscle use.   Cardiovascular: Irregular rate and rhythm,no murmurs / rubs / gallops, 2-3+ BLE edema, 1+ pedal pulses.  Abdomen: no tenderness, no masses palpated. Bowel sounds positive. Abdomen is slightly protuberant but soft, no appreciable ascites  Musculoskeletal: no clubbing / cyanosis. No joint deformity upper and lower extremities. Good ROM, no contractures. Normal muscle tone.  Skin: no rashes, lesions, ulcers. Neurologic: CN 2-12 grossly intact. Sensation intact, Strength 5/5 in all 4.  Psychiatric: Normal judgment and insight. Alert and oriented x 3. Normal mood.   Labs on Admission: I have personally reviewed following labs and imaging studies  CBC:  Recent Labs Lab 12/05/15 0933  WBC 8.3  HGB 14.7  HCT 42.7  MCV 98.8  PLT 122*   Radiological Exams on Admission: Dg Chest 2 View  12/05/2015  CLINICAL DATA:  Shortness of breath for 2 weeks EXAM: CHEST  2 VIEW COMPARISON:  09/21/2015 FINDINGS: Cardiac shadow is mildly enlarged. Small bilateral pleural effusions are noted as well as bibasilar atelectasis. No acute bony abnormality is seen. IMPRESSION: Small bilateral pleural effusions and bibasilar atelectasis. Electronically Signed   By: Inez Catalina M.D.   On: 12/05/2015 10:00    EKG: Independently reviewed.   EKG Interpretation  Date/Time:  Monday Dec 05 2015 08:56:29 EDT Ventricular Rate:  116 PR Interval:    QRS Duration: 102 QT Interval:  339 QTC Calculation: 471 R Axis:   -84 Text Interpretation:  Atrial flutter Ventricular premature complex Left anterior fascicular block Abnormal R-wave progression, late transition Nonspecific repol abnormality, lateral leads ST elevation, consider inferior injury Confirmed by [facility] MD, Corene Cornea [phone]) on 12/05/2015 9:00:41 AM Also confirmed by [facility] MD, Corene Cornea [phone]), editor Rolla Plate, Joelene Millin [phone])  on 12/05/2015 9:37:24 AM       Assessment/Plan   Active Problems:   SOB (shortness of breath)   Atrial flutter (HCC)   Edema    Hypothyroidism   ETOH abuse   Thrombocytopenia (HCC)   Dyspnea / edema, likely secondary new onset heart failure. Sats normal on room air but he is tachypneic -Admit to Medical bed with telemetry.   -Heart failure order set utilized  -Follow up on echocardiogram  -Lasix 40 mg IV twice a day. K+ 3.8, start empiric KCL 92mEq daily  -daily wts, I&0  -prn o2   Atrial flutter / A-fib, new onset. V-rate 116. CHADSVASC 3. Given 5mg  of IV Lopressor in ED with improvement in rate to 90's.   -A-Fib order set utilized  -obtain TSH , check Mg+ level. K+ is normal  -cardiology consult for new onset A-flutter and heart failure. Will defer decision for anti-coagulation to  Cardiology  -echocardiogram    -I &0    -daily weights   Weight loss / protein calorie malnutrition, 30 pounds in 8 months. Albumin 3.2 . Possibly related to heart failure.  -Recent CTscan of abd/pelvis for weight loss was  unrevealing  -pre-albumin  -nutritional consult  ETOH abuse. Can't exclude chronic liver disease based on exam and lab findings  -check INR   -CIWA protocol  Thrombocytopenia, chronic. Stable. Platelet count 122. Etiology? Possibly bone marrow suppression secondary to ETOH.  Hypothyroidism.   -Continue home Synthroid  -Check TSH  Lumbar disc disease. Supposed to be having surgery in near future.   -continue Gabapentin and Robaxin   DVT prophylaxis:   Lovenox Code Status:   Full code  Family Communication: None  Disposition Plan: Discharge home in 24-48 hours         Consults called: Cardiology Admission status:  Admission -Telemetry  Tye Savoy NP [facility] Pager [phone]  If 7PM-7AM, please contact night-coverage [email or web address] Password [email or web address]  12/05/2015, 11:00 AM

## 2015-12-06 DIAGNOSIS — I429 Cardiomyopathy, unspecified: Secondary | ICD-10-CM | POA: Insufficient documentation

## 2015-12-06 DIAGNOSIS — I5021 Acute systolic (congestive) heart failure: Secondary | ICD-10-CM

## 2015-12-06 DIAGNOSIS — I483 Typical atrial flutter: Secondary | ICD-10-CM

## 2015-12-06 LAB — BASIC METABOLIC PANEL
ANION GAP: 9 (ref 5–15)
BUN: 17 mg/dL (ref 6–20)
CALCIUM: 8.7 mg/dL — AB (ref 8.9–10.3)
CO2: 29 mmol/L (ref 22–32)
Chloride: 104 mmol/L (ref 101–111)
Creatinine, Ser: 1.07 mg/dL (ref 0.61–1.24)
GFR calc Af Amer: >60 mL/min (ref >60)
GFR calc non Af Amer: >60 mL/min (ref >60)
GLUCOSE: 105 mg/dL — AB (ref 65–99)
POTASSIUM: 3.9 mmol/L (ref 3.5–5.1)
Sodium: 142 mmol/L (ref 135–145)

## 2015-12-06 LAB — CBC
HCT: 43.4 % (ref 39.0–52.0)
HEMOGLOBIN: 14.8 g/dL (ref 13.0–17.0)
MCH: 33.5 pg (ref 26.0–34.0)
MCHC: 34.1 g/dL (ref 30.0–36.0)
MCV: 98.2 fL (ref 78.0–100.0)
PLATELETS: 116 10*3/uL — AB (ref 150–400)
RBC: 4.42 MIL/uL (ref 4.22–5.81)
RDW: 14.9 % (ref 11.5–15.5)
WBC: 5.7 10*3/uL (ref 4.0–10.5)

## 2015-12-06 LAB — TSH: TSH: 4.961 u[IU]/mL — AB (ref 0.350–4.500)

## 2015-12-06 MED ORDER — LISINOPRIL 2.5 MG PO TABS
2.5000 mg | ORAL_TABLET | Freq: Every day | ORAL | Status: DC
  Administered 2015-12-06 – 2015-12-09 (×4): 2.5 mg via ORAL
  Filled 2015-12-06 (×4): qty 1

## 2015-12-06 MED ORDER — ENSURE ENLIVE PO LIQD
237.0000 mL | Freq: Two times a day (BID) | ORAL | Status: DC
  Administered 2015-12-07 – 2015-12-09 (×2): 237 mL via ORAL

## 2015-12-06 NOTE — Progress Notes (Signed)
[facility] PROGRESS NOTE  Lucas Fuller [id number] DOB: 07-09-1941 DOA: 12/05/2015 PCP: Merrilee Seashore, MD  Assessment/Plan: 75 y/o male with PMH of Hypothyroidism, COPD, Chronic Tobacco use, Alcoholism presented with SOB, leg edema. Admitted with CHF, and found to have A fib  Acute systolic CHF. Echo. LVEF -20-25%. Aortic stenosis. AVA 1.09 cm2.Diffuse hypokinesia. ? etoh related  -Pt is started on IV diuresis with laxis, cont monitor I/O, urine output. Will start low dose lisinopril, will consider BB if HR remains stable   Afib. HR is controlled. Pt is not a good candidate for anticoagulation due to h/o massive GI bleed, ongoing alcohol use, thrombocytopenia   Hypothyroidism, check tsh. Cont levothyroxine   Alcoholism, cont monitor on CIWA. No s/s of acute withdrawals   Code Status: full Family Communication:  D/w patient, RN (indicate person spoken with, relationship, and if by phone, the number) Disposition Plan: 24-48 hrs    Consultants:  Cardiology   Procedures: Echo   Antibiotics:  none (indicate start date, and stop date if known)  HPI/Subjective: Alert, oriented   Objective: Filed Vitals:   12/05/15 2014 12/06/15 0508  BP: 106/67 129/91  Pulse: 86 64  Temp: 97.5 F (36.4 C) 97.4 F (36.3 C)  Resp: 18 18    Intake/Output Summary (Last 24 hours) at 12/06/15 0911 Last data filed at 12/05/15 2015  Gross per 24 hour  Intake    240 ml  Output    950 ml  Net   -710 ml   Filed Weights   12/05/15 0857 12/05/15 1249 12/06/15 0508  Weight: 72.576 kg (160 lb) 72.5 kg (159 lb 13.3 oz) 69.219 kg (152 lb 9.6 oz)    Exam:   General:  Alert, no distress   Cardiovascular: s1,s2 rrr  Respiratory: BL carckles   Abdomen: soft, nt   Musculoskeletal: mild leg edema    Data Reviewed: Basic Metabolic Panel:  Recent Labs Lab 12/05/15 0933 12/05/15 1358 12/06/15 0349  NA 140  --  142  K 3.8  --  3.9  CL 106  --  104  CO2 26  --  29   GLUCOSE 105*  --  105*  BUN 15  --  17  CREATININE 1.20  --  1.07  CALCIUM 8.8*  --  8.7*  MG  --  1.9  --    Liver Function Tests:  Recent Labs Lab 12/05/15 0933  AST 41  ALT 36  ALKPHOS 68  BILITOT 1.0  PROT 5.8*  ALBUMIN 3.2*   No results for input(s): LIPASE, AMYLASE in the last 168 hours. No results for input(s): AMMONIA in the last 168 hours. CBC:  Recent Labs Lab 12/05/15 0933 12/06/15 0349  WBC 8.3 5.7  HGB 14.7 14.8  HCT 42.7 43.4  MCV 98.8 98.2  PLT 122* 116*   Cardiac Enzymes: No results for input(s): CKTOTAL, CKMB, CKMBINDEX, TROPONINI in the last 168 hours. BNP (last 3 results)  Recent Labs  12/05/15 0933  BNP 971.0*    ProBNP (last 3 results) No results for input(s): PROBNP in the last 8760 hours.  CBG: No results for input(s): GLUCAP in the last 168 hours.  No results found for this or any previous visit (from the past 240 hour(s)).   Studies: Dg Chest 2 View  12/05/2015  CLINICAL DATA:  Shortness of breath for 2 weeks EXAM: CHEST  2 VIEW COMPARISON:  09/21/2015 FINDINGS: Cardiac shadow is mildly enlarged. Small bilateral pleural effusions are noted as well as bibasilar  atelectasis. No acute bony abnormality is seen. IMPRESSION: Small bilateral pleural effusions and bibasilar atelectasis. Electronically Signed   By: Inez Catalina M.D.   On: 12/05/2015 10:00    Scheduled Meds: . enoxaparin (LOVENOX) injection  40 mg Subcutaneous Q24H  . folic acid  1 mg Oral Daily  . furosemide  20 mg Intravenous BID  . gabapentin  200 mg Oral TID  . levothyroxine  50 mcg Oral QAC breakfast  . methocarbamol  500 mg Oral BID  . multivitamin with minerals  1 tablet Oral Daily  . potassium chloride  20 mEq Oral Daily  . sodium chloride flush  3 mL Intravenous Q12H  . thiamine  100 mg Oral Daily   Continuous Infusions:   Active Problems:   SOB (shortness of breath)   Atrial flutter (HCC)   Edema   Hypothyroidism   ETOH abuse   Thrombocytopenia  (HCC)   Weight loss   Lumbar disc disease   Protein calorie malnutrition (HCC)   Heart failure (HCC)   Elevated brain natriuretic peptide (BNP) level   Paroxysmal atrial fibrillation (HCC)   Acute diastolic congestive heart failure ([place])    Time spent: >35 minutes     Kinnie Feil  [facility] Pager [phone]. If 7PM-7AM, please contact night-coverage at [email or web address], password [facility] 12/06/2015, 9:11 AM  LOS: 1 day

## 2015-12-06 NOTE — Progress Notes (Signed)
Patient Profile: 75 y/o male admitted for acute CHF in the setting of atrial flutter w/ RVR.   Subjective: Currently asymptomatic. He denies palpitations. No resting dyspnea nor CP.   Objective: Vital signs in last 24 hours: Temp:  [97.4 F (36.3 C)-97.5 F (36.4 C)] 97.4 F (36.3 C) (05/16 0508) Pulse Rate:  [46-96] 46 (05/16 0921) Resp:  [18-32] 18 (05/16 0508) BP: (102-145)/(63-112) 102/63 mmHg (05/16 0921) SpO2:  [96 %-99 %] 97 % (05/16 0921) Weight:  [152 lb 9.6 oz (69.219 kg)-159 lb 13.3 oz (72.5 kg)] 152 lb 9.6 oz (69.219 kg) (05/16 0508) Last BM Date: 12/04/15  Intake/Output from previous day: 05/15 0701 - 05/16 0700 In: 240 [P.O.:240] Out: 950 [Urine:950] Intake/Output this shift:    Medications Current Facility-Administered Medications  Medication Dose Route Frequency Provider Last Rate Last Dose  . 0.9 %  sodium chloride infusion  250 mL Intravenous PRN Willia Craze, NP      . acetaminophen (TYLENOL) tablet 650 mg  650 mg Oral Q4H PRN Willia Craze, NP      . enoxaparin (LOVENOX) injection 40 mg  40 mg Subcutaneous Q24H Willia Craze, NP   40 mg at 12/05/15 1753  . folic acid (FOLVITE) tablet 1 mg  1 mg Oral Daily Willia Craze, NP   1 mg at 12/06/15 0919  . furosemide (LASIX) injection 20 mg  20 mg Intravenous BID Waldemar Dickens, MD   20 mg at 12/06/15 0919  . gabapentin (NEURONTIN) capsule 200 mg  200 mg Oral TID Willia Craze, NP   200 mg at 12/06/15 0919  . levothyroxine (SYNTHROID, LEVOTHROID) tablet 50 mcg  50 mcg Oral QAC breakfast Willia Craze, NP   50 mcg at 12/06/15 0919  . lisinopril (PRINIVIL,ZESTRIL) tablet 2.5 mg  2.5 mg Oral Daily Kinnie Feil, MD      . LORazepam (ATIVAN) tablet 1 mg  1 mg Oral Q6H PRN Willia Craze, NP       Or  . LORazepam (ATIVAN) injection 1 mg  1 mg Intravenous Q6H PRN Willia Craze, NP      . methocarbamol (ROBAXIN) tablet 500 mg  500 mg Oral BID Willia Craze, NP   500 mg at 12/06/15  0919  . multivitamin with minerals tablet 1 tablet  1 tablet Oral Daily Willia Craze, NP   1 tablet at 12/06/15 0919  . ondansetron (ZOFRAN) injection 4 mg  4 mg Intravenous Q6H PRN Willia Craze, NP      . potassium chloride SA (K-DUR,KLOR-CON) CR tablet 20 mEq  20 mEq Oral Daily Willia Craze, NP   20 mEq at 12/06/15 0919  . sodium chloride flush (NS) 0.9 % injection 3 mL  3 mL Intravenous Q12H Willia Craze, NP   3 mL at 12/05/15 2112  . sodium chloride flush (NS) 0.9 % injection 3 mL  3 mL Intravenous PRN Willia Craze, NP      . thiamine (VITAMIN B-1) tablet 100 mg  100 mg Oral Daily Willia Craze, NP   100 mg at 12/06/15 0919    Filed Weights   12/05/15 0857 12/05/15 1249 12/06/15 0508  Weight: 160 lb (72.576 kg) 159 lb 13.3 oz (72.5 kg) 152 lb 9.6 oz (69.219 kg)    PE: General appearance: alert, cooperative, no distress and elderly, frail appearing Neck: no carotid bruit and no JVD Lungs: clear to auscultation bilaterally Heart: irregularly irregular  rhythm and tachy rate Extremities: 1+ bilateral LEE Pulses: 2+ and symmetric Skin: warm and dry Neurologic: Grossly normal  Lab Results:   Recent Labs  12/05/15 0933 12/06/15 0349  WBC 8.3 5.7  HGB 14.7 14.8  HCT 42.7 43.4  PLT 122* 116*   BMET  Recent Labs  12/05/15 0933 12/06/15 0349  NA 140 142  K 3.8 3.9  CL 106 104  CO2 26 29  GLUCOSE 105* 105*  BUN 15 17  CREATININE 1.20 1.07  CALCIUM 8.8* 8.7*   PT/INR  Recent Labs  12/05/15 1358  LABPROT 16.3*  INR 1.29   Cardiac Panel (last 3 results) No results for input(s): CKTOTAL, CKMB, TROPONINI, RELINDX in the last 72 hours.  Studies/Results: 2D Echo 12/05/15  Study Conclusions  - Left ventricle: The cavity size was normal. There was mild  concentric hypertrophy. Systolic function was severely reduced.  The estimated ejection fraction was in the range of 20% to 25%.  Diffuse hypokinesis. The study was not technically  sufficient to  allow evaluation of LV diastolic dysfunction due to atrial  fibrillation. - Aortic valve: Possibly bicuspid; moderately thickened, moderately  calcified leaflets. Valve mobility was restricted. There was mild  stenosis. There was trivial regurgitation. Mean gradient (S): 5  mm Hg. Peak gradient (S): 9 mm Hg. Valve area (VTI): 0.78 cm^2.  Valve area (Vmax): 1.09 cm^2. Valve area (Vmean): 1.09 cm^2. - Aortic root: The aortic root was normal in size. - Mitral valve: Structurally normal valve. There was mild  regurgitation. - Left atrium: The atrium was moderately dilated. - Right ventricle: The cavity size was moderately decreased. Wall  thickness was normal. Systolic function was normal. - Right atrium: The atrium was moderately dilated. - Tricuspid valve: There was mild regurgitation. - Pulmonic valve: There was no regurgitation. - Pulmonary arteries: Systolic pressure was mildly increased. PA  peak pressure: 37 mm Hg (S). - Inferior vena cava: The vessel was normal in size. - Pericardium, extracardiac: There was no pericardial effusion.  Assessment/Plan  Active Problems:   SOB (shortness of breath)   Atrial flutter (HCC)   Edema   Hypothyroidism   ETOH abuse   Thrombocytopenia (HCC)   Weight loss   Lumbar disc disease   Protein calorie malnutrition (HCC)   Heart failure (HCC)   Elevated brain natriuretic peptide (BNP) level   Paroxysmal atrial fibrillation (HCC)   Acute diastolic congestive heart failure ([place])   1. Acute Systolic CHF: 2D Echo revealed severely reduced LV systolic function with an EF of 20-25%. The study was not technically sufficient to allow evaluation of LV diastolic dysfunction due to atrial fibrillation. Diffuse hypokinesis noted. Mild AS, mild MR. No prior echos for comparison. Enzymes not cycled. ? Tachy mediated from atrial fib/flutter vs ischemic etiology. Will need to consider ischemic w/u. Would favor NST first to r/o coronary  ischemia.   In regards to volume status, documented total UO from yesterday was only 950 mL. ? If accurately recorded. Based on weight measurements, weight is down 8 lb since yesterday from 160>>152 lb. He continues to have bilateral LEE on exam. Renal function is normal. Continue IV Lasix, strict I/Os and daily weights. Monitor BP, K and renal function. Continue ACE-I. Recommend BB therapy for LVF and rate control of atrial arrhthymia. Favor long acting metoprolol vs Coreg given LV dysfunction.   2. Atrial Fib/Flutter: TSH is WNL. K is stable at 3.9. Mg also stable at 1.9. Rate is poorly controlled in the 110s-120s. Recommend  BB therapy for LVF and rate control of atrial arrrhtymia. Favor long acting metoprolol vs Coreg given LV dysfunction.  Despite CHA2DS2 VASc score, he seems to be a very poor candidate for anti-coagulation given his prior GI bleeding and ongoing alcohol abuse, thus risk> benefit.     LOS: 1 day    Brittainy M. Ladoris Gene 12/06/2015 9:34 AM  I have personally seen and examined this patient with Lyda Jester, PA-C. I agree with the assessment and plan as outlined above. He is admitted with CHF and seems to be diuresing well with IV Lasix. He is now found to have a cardiomyopathy. This may be ischemic or rate related vs other cause such as etoh abuse. I would plan a stress myoview to assess for ischemia. I will arrange this for tomorrow. Continue Lisinopril. HR is 70s-80s today. Would add beta blocker (Coreg 3.125 mg po BID). He is not a good candidate for long term anti-coagulation.   Lauree Chandler 12/06/2015 10:43 AM

## 2015-12-06 NOTE — Consult Note (Signed)
   [facility] CM Inpatient Consult   12/06/2015  Lucas Fuller August 20, 1940 [id number]   Referral received from inpatient [name] for [name] program.  [name] to bedside to discuss and offer [place] [facility] services. Patient is agreeable and written consent signed. Explained to patient that he/she will receive post hospital transition of care calls and will be evaluated for monthly home visits. Confirmed Primary Care MD as Dr. Ashby Dawes. Confirmed best contact number as [phone] and [phone]. Explained that [place] [facility] will not interfere or replace services provided by home health.  Left [name] and contact information at bedside. Will make inpatient RNCM aware that patient will be followed by [name] post hospital discharge.  [name] endorses he recently moved in with his 39 year old mother. He also states he has been working part-time up until this point. Denies having issues with medications or with transportation. Discussed primary focus will chronic disease management related to CHF. He is agreeable to this. Expressed appreciation of visit.   Marthenia Rolling, MSN-Ed, RN,BSN [facility] Liaison [phone]

## 2015-12-06 NOTE — Progress Notes (Signed)
Initial Nutrition Assessment  DOCUMENTATION CODES:   Non-severe (moderate) malnutrition in context of chronic illness  INTERVENTION:   Ensure Enlive po BID, each supplement provides 350 kcal and 20 grams of protein  NUTRITION DIAGNOSIS:   Increased nutrient needs related to chronic illness as evidenced by estimated needs  GOAL:   Patient will meet greater than or equal to 90% of their needs  MONITOR:   PO intake, Supplement acceptance, Labs, Weight trends, I & O's  REASON FOR ASSESSMENT:   Consult Assessment of nutrition requirement/status  ASSESSMENT:   75 y.o. male with a PMH of EtOH abuse, thyroid disease, bladder cancer who presents with new onset CHF and A. fib. As patient is diuretic nave we will decrease his initial Lasix 40 mg IV to 20 mg IV twice a day.  Patient not very conversant upon RD interview. He does report he's eating well. Unsure if he's lost weight. PO intake 100% per flowsheet records. Would benefit from oral nutrition supplements >> RD to order.  Nutrition-Focused physical exam completed. Findings are moderate fat depletion, moderate muscle depletion, and no edema.   Diet Order:  Diet Heart Room service appropriate?: Yes; Fluid consistency:: Thin Diet NPO time specified Except for: Sips with Meds  Skin:  Reviewed, no issues  Last BM:  5/14  Height:   Ht Readings from Last 1 Encounters:  12/05/15 5\' 7"  (1.702 m)    Weight:   Wt Readings from Last 1 Encounters:  12/06/15 152 lb 9.6 oz (69.219 kg)    Ideal Body Weight:  61.3 kg  BMI:  Body mass index is 23.89 kg/(m^2).  Estimated Nutritional Needs:   Kcal:  1700-1900  Protein:  80-90 gm  Fluid:  1.7-1.9 L  EDUCATION NEEDS:   No education needs identified at this time  Lucas Fuller, RD, LDN Pager #: [phone] After-Hours Pager #: [phone]

## 2015-12-06 NOTE — Progress Notes (Signed)
Utilization review completed.  

## 2015-12-07 ENCOUNTER — Inpatient Hospital Stay (HOSPITAL_COMMUNITY): Payer: Medicare Other

## 2015-12-07 DIAGNOSIS — I429 Cardiomyopathy, unspecified: Secondary | ICD-10-CM

## 2015-12-07 DIAGNOSIS — E440 Moderate protein-calorie malnutrition: Secondary | ICD-10-CM

## 2015-12-07 DIAGNOSIS — I4892 Unspecified atrial flutter: Secondary | ICD-10-CM

## 2015-12-07 DIAGNOSIS — E038 Other specified hypothyroidism: Secondary | ICD-10-CM

## 2015-12-07 HISTORY — DX: Moderate protein-calorie malnutrition: E44.0

## 2015-12-07 LAB — CBC
HCT: 41.9 % (ref 39.0–52.0)
HEMOGLOBIN: 14 g/dL (ref 13.0–17.0)
MCH: 32.6 pg (ref 26.0–34.0)
MCHC: 33.4 g/dL (ref 30.0–36.0)
MCV: 97.7 fL (ref 78.0–100.0)
Platelets: 111 10*3/uL — ABNORMAL LOW (ref 150–400)
RBC: 4.29 MIL/uL (ref 4.22–5.81)
RDW: 14.9 % (ref 11.5–15.5)
WBC: 5.2 10*3/uL (ref 4.0–10.5)

## 2015-12-07 LAB — BASIC METABOLIC PANEL
ANION GAP: 11 (ref 5–15)
BUN: 13 mg/dL (ref 6–20)
CALCIUM: 8.3 mg/dL — AB (ref 8.9–10.3)
CO2: 26 mmol/L (ref 22–32)
Chloride: 104 mmol/L (ref 101–111)
Creatinine, Ser: 0.97 mg/dL (ref 0.61–1.24)
GFR calc Af Amer: >60 mL/min (ref >60)
Glucose, Bld: 94 mg/dL (ref 65–99)
Potassium: 3.9 mmol/L (ref 3.5–5.1)
SODIUM: 141 mmol/L (ref 135–145)

## 2015-12-07 LAB — NM MYOCAR MULTI W/SPECT W/WALL MOTION / EF
CSEPPHR: 112 {beats}/min
Rest HR: 100 {beats}/min

## 2015-12-07 MED ORDER — REGADENOSON 0.4 MG/5ML IV SOLN
0.4000 mg | Freq: Once | INTRAVENOUS | Status: AC
  Administered 2015-12-07: 0.4 mg via INTRAVENOUS
  Filled 2015-12-07: qty 5

## 2015-12-07 MED ORDER — TECHNETIUM TC 99M TETROFOSMIN IV KIT
30.0000 | PACK | Freq: Once | INTRAVENOUS | Status: AC | PRN
  Administered 2015-12-07: 30 via INTRAVENOUS

## 2015-12-07 MED ORDER — REGADENOSON 0.4 MG/5ML IV SOLN
INTRAVENOUS | Status: AC
  Filled 2015-12-07: qty 5

## 2015-12-07 MED ORDER — TECHNETIUM TC 99M TETROFOSMIN IV KIT
10.0000 | PACK | Freq: Once | INTRAVENOUS | Status: AC | PRN
  Administered 2015-12-07: 10 via INTRAVENOUS

## 2015-12-07 NOTE — Progress Notes (Signed)
The patient tolerated the lexiscan well.  Tarri Fuller [facility]

## 2015-12-07 NOTE — Progress Notes (Signed)
Patient Profile: 75 y/o male admitted for acute CHF in the setting of atrial flutter w/ RVR.   Subjective: Currently asymptomatic. He denies palpitations. No resting dyspnea nor CP.   Objective: Vital signs in last 24 hours: Temp:  [97.5 F (36.4 C)-98.6 F (37 C)] 98.6 F (37 C) (05/17 0527) Pulse Rate:  [46-92] 92 (05/17 0527) Resp:  [18-19] 19 (05/17 0527) BP: (99-113)/(52-73) 113/73 mmHg (05/17 0527) SpO2:  [95 %-97 %] 97 % (05/17 0527) Weight:  [153 lb (69.4 kg)] 153 lb (69.4 kg) (05/17 0527) Last BM Date: 12/04/15  Intake/Output from previous day: 05/16 0701 - 05/17 0700 In: 960 [P.O.:960] Out: 1551 [Urine:1550; Stool:1] Intake/Output this shift:    Medications Current Facility-Administered Medications  Medication Dose Route Frequency Provider Last Rate Last Dose  . 0.9 %  sodium chloride infusion  250 mL Intravenous PRN Willia Craze, NP      . acetaminophen (TYLENOL) tablet 650 mg  650 mg Oral Q4H PRN Willia Craze, NP   650 mg at 12/06/15 1826  . enoxaparin (LOVENOX) injection 40 mg  40 mg Subcutaneous Q24H Willia Craze, NP   40 mg at 12/06/15 1158  . feeding supplement (ENSURE ENLIVE) (ENSURE ENLIVE) liquid 237 mL  237 mL Oral BID BM Kinnie Feil, MD      . folic acid (FOLVITE) tablet 1 mg  1 mg Oral Daily Willia Craze, NP   1 mg at 12/06/15 0919  . furosemide (LASIX) injection 20 mg  20 mg Intravenous BID Waldemar Dickens, MD   20 mg at 12/07/15 [id number]  . gabapentin (NEURONTIN) capsule 200 mg  200 mg Oral TID Willia Craze, NP   200 mg at 12/06/15 2239  . levothyroxine (SYNTHROID, LEVOTHROID) tablet 50 mcg  50 mcg Oral QAC breakfast Willia Craze, NP   50 mcg at 12/07/15 0810  . lisinopril (PRINIVIL,ZESTRIL) tablet 2.5 mg  2.5 mg Oral Daily Kinnie Feil, MD   2.5 mg at 12/06/15 1158  . LORazepam (ATIVAN) tablet 1 mg  1 mg Oral Q6H PRN Willia Craze, NP       Or  . LORazepam (ATIVAN) injection 1 mg  1 mg Intravenous Q6H PRN Willia Craze, NP      . methocarbamol (ROBAXIN) tablet 500 mg  500 mg Oral BID Willia Craze, NP   500 mg at 12/06/15 2239  . multivitamin with minerals tablet 1 tablet  1 tablet Oral Daily Willia Craze, NP   1 tablet at 12/06/15 0919  . ondansetron (ZOFRAN) injection 4 mg  4 mg Intravenous Q6H PRN Willia Craze, NP      . potassium chloride SA (K-DUR,KLOR-CON) CR tablet 20 mEq  20 mEq Oral Daily Willia Craze, NP   20 mEq at 12/06/15 0919  . sodium chloride flush (NS) 0.9 % injection 3 mL  3 mL Intravenous Q12H Willia Craze, NP   3 mL at 12/06/15 2245  . sodium chloride flush (NS) 0.9 % injection 3 mL  3 mL Intravenous PRN Willia Craze, NP      . thiamine (VITAMIN B-1) tablet 100 mg  100 mg Oral Daily Willia Craze, NP   100 mg at 12/06/15 0919    Filed Weights   12/05/15 1249 12/06/15 0508 12/07/15 0527  Weight: 159 lb 13.3 oz (72.5 kg) 152 lb 9.6 oz (69.219 kg) 153 lb (69.4 kg)    PE: General appearance:  alert, cooperative, no distress and elderly, frail appearing Neck: no carotid bruit and no JVD Lungs: clear to auscultation bilaterally Heart: irregularly irregular rhythm and tachy rate Extremities: trace bilateral LEE Pulses: 2+ and symmetric Skin: warm and dry Neurologic: Grossly normal  Lab Results:   Recent Labs  12/05/15 0933 12/06/15 0349 12/07/15 0324  WBC 8.3 5.7 5.2  HGB 14.7 14.8 14.0  HCT 42.7 43.4 41.9  PLT 122* 116* 111*   BMET  Recent Labs  12/05/15 0933 12/06/15 0349 12/07/15 0324  NA 140 142 141  K 3.8 3.9 3.9  CL 106 104 104  CO2 26 29 26   GLUCOSE 105* 105* 94  BUN 15 17 13   CREATININE 1.20 1.07 0.97  CALCIUM 8.8* 8.7* 8.3*   PT/INR  Recent Labs  12/05/15 1358  LABPROT 16.3*  INR 1.29   Cardiac Panel (last 3 results) No results for input(s): CKTOTAL, CKMB, TROPONINI, RELINDX in the last 72 hours.  Studies/Results: 2D Echo 12/05/15  Study Conclusions  - Left ventricle: The cavity size was normal. There was  mild  concentric hypertrophy. Systolic function was severely reduced.  The estimated ejection fraction was in the range of 20% to 25%.  Diffuse hypokinesis. The study was not technically sufficient to  allow evaluation of LV diastolic dysfunction due to atrial  fibrillation. - Aortic valve: Possibly bicuspid; moderately thickened, moderately  calcified leaflets. Valve mobility was restricted. There was mild  stenosis. There was trivial regurgitation. Mean gradient (S): 5  mm Hg. Peak gradient (S): 9 mm Hg. Valve area (VTI): 0.78 cm^2.  Valve area (Vmax): 1.09 cm^2. Valve area (Vmean): 1.09 cm^2. - Aortic root: The aortic root was normal in size. - Mitral valve: Structurally normal valve. There was mild  regurgitation. - Left atrium: The atrium was moderately dilated. - Right ventricle: The cavity size was moderately decreased. Wall  thickness was normal. Systolic function was normal. - Right atrium: The atrium was moderately dilated. - Tricuspid valve: There was mild regurgitation. - Pulmonic valve: There was no regurgitation. - Pulmonary arteries: Systolic pressure was mildly increased. PA  peak pressure: 37 mm Hg (S). - Inferior vena cava: The vessel was normal in size. - Pericardium, extracardiac: There was no pericardial effusion.  Assessment/Plan  Active Problems:   SOB (shortness of breath)   Atrial flutter (HCC)   Edema   Hypothyroidism   ETOH abuse   Thrombocytopenia (HCC)   Weight loss   Lumbar disc disease   Protein calorie malnutrition (HCC)   Heart failure (HCC)   Elevated brain natriuretic peptide (BNP) level   Paroxysmal atrial fibrillation (HCC)   Acute diastolic congestive heart failure (HCC)   Acute systolic CHF (congestive heart failure) (HCC)   Cardiomyopathy (HCC)   Malnutrition of moderate degree   1. Acute Systolic CHF: 2D Echo revealed severely reduced LV systolic function with an EF of 20-25%. The study was not technically sufficient  to allow evaluation of LV diastolic dysfunction due to atrial fibrillation. Diffuse hypokinesis noted. Mild AS, mild MR. No prior echos for comparison. Enzymes not cycled. ? Tachy mediated from atrial fib/flutter vs ischemic etiology vs ETOH abuse. Plan is for NST today to r/o ischemia.   In regards to volume status, he is diuresing well. -1.5L out yesterday. Weight is down 7 lb since admit from 160>>153 lb. He continues to have trace LEE on exam. Renal function is normal. Continue IV Lasix, strict I/Os and daily weights. Monitor BP, K and renal function. Continue ACE-I. He  is tolerating addition of  BB therapy with Coreg for LVF and rate control of atrial arrhthymia. Low sodium diet.   2. Atrial Fib/Flutter: TSH is WNL. K is stable at 3.9. Mg also stable at 1.9. Rate is still in the low 100s but improved from the 120s yesterday with the addition of Coreg. Continue to adjust HR meds. Avoid Cardizem given LV dysfunction. Can consider addition of digoxin. Despite CHA2DS2 VASc score, he seems to be a very poor candidate for anti-coagulation given his prior GI bleeding and ongoing alcohol abuse, thus risk> benefit. Given he is not a candidate for a/c, would favor rate control strategy. No antiarrythmics as we will need to avoid chemical conversion.      LOS: 2 days    Brittainy M. Ladoris Gene 12/07/2015 8:21 AM   I have personally seen and examined this patient with Lyda Jester, PA-C. I agree with the assessment and plan as outlined above. He is admitted with CHF and is diuresing well with IV Lasix. He is now found to have a cardiomyopathy. This may be ischemic or rate related vs other cause such as etoh abuse. Stress myoview today to assess for ischemia. Continue Lisinopril. HR is 70s-80s today. Would resume Coreg 3.125 mg po BID post stress test. He is not a good candidate for long term anti-coagulation.  Lauree Chandler 12/07/2015 9:32 AM

## 2015-12-07 NOTE — Progress Notes (Addendum)
[facility] PROGRESS NOTE  CETH MULLADY [id number] DOB: Jan 20, 1941 DOA: 12/05/2015  PCP: Merrilee Seashore, MD  Brief HPI: 75yo African-American male with a past medical history of hypothyroidism, COPD, nonicteric tobacco use, alcoholism, presented with shortness of breath and leg edema. He was admitted with congestive heart failure and was found to have atrial fibrillation.  Past medical history:  Past Medical History  Diagnosis Date  . Thyroid disease   . Bladder cancer [facility])     Consultants: Cardiology  Procedures:  Transthoracic echocardiogram Study Conclusions - Left ventricle: The cavity size was normal. There was mild concentric hypertrophy. Systolic function was severely reduced. The estimated ejection fraction was in the range of 20% to 25%. Diffuse hypokinesis. The study was not technically sufficient to allow evaluation of LV diastolic dysfunction due to atrial fibrillation. - Aortic valve: Possibly bicuspid; moderately thickened, moderately calcified leaflets. Valve mobility was restricted. There was mild stenosis. There was trivial regurgitation. Mean gradient (S): 5 mm Hg. Peak gradient (S): 9 mm Hg. Valve area (VTI): 0.78 cm^2. Valve area (Vmax): 1.09 cm^2. Valve area (Vmean): 1.09 cm^2. - Aortic root: The aortic root was normal in size. - Mitral valve: Structurally normal valve. There was mild regurgitation. - Left atrium: The atrium was moderately dilated. - Right ventricle: The cavity size was moderately decreased. Wall thickness was normal. Systolic function was normal. - Right atrium: The atrium was moderately dilated. - Tricuspid valve: There was mild regurgitation. - Pulmonic valve: There was no regurgitation. - Pulmonary arteries: Systolic pressure was mildly increased. PA peak pressure: 37 mm Hg (S). - Inferior vena cava: The vessel was normal in size. - Pericardium, extracardiac: There was no pericardial  effusion.   Antibiotics: None  Subjective: Patient denies any chest pain or shortness of breath. No nausea, vomiting. Feels much better.  Objective:  Vital Signs  Filed Vitals:   12/07/15 1034 12/07/15 1035 12/07/15 1037 12/07/15 1306  BP: 118/91 124/90 113/88 103/66  Pulse:    109  Temp:    97.7 F (36.5 C)  TempSrc:    Oral  Resp:    18  Height:      Weight:      SpO2:    98%    Intake/Output Summary (Last 24 hours) at 12/07/15 1310 Last data filed at 12/07/15 0908  Gross per 24 hour  Intake    480 ml  Output   1651 ml  Net  -1171 ml   Filed Weights   12/05/15 1249 12/06/15 0508 12/07/15 0527  Weight: 72.5 kg (159 lb 13.3 oz) 69.219 kg (152 lb 9.6 oz) 69.4 kg (153 lb)    General appearance: alert, cooperative, appears stated age and no distress Resp: Diminished air entry at bases. No crackles or wheezing. No rhonchi. Cardio: S1, S2 is irregularly irregular. No S3, S4. No rubs, or bruit.  GI: soft, non-tender; bowel sounds normal; no masses,  no organomegaly Extremities: Minimal pedal edema Neurologic: Awake and alert. Oriented 3. No focal neurological deficits.  Lab Results:  Data Reviewed: I have personally reviewed following labs and imaging studies  CBC:  Recent Labs Lab 12/05/15 0933 12/06/15 0349 12/07/15 0324  WBC 8.3 5.7 5.2  HGB 14.7 14.8 14.0  HCT 42.7 43.4 41.9  MCV 98.8 98.2 97.7  PLT 122* 116* [id number]*   Basic Metabolic Panel:  Recent Labs Lab 12/05/15 0933 12/05/15 1358 12/06/15 0349 12/07/15 0324  NA 140  --  142 141  K 3.8  --  3.9 3.9  CL 106  --  104 104  CO2 26  --  29 26  GLUCOSE 105*  --  105* 94  BUN 15  --  17 13  CREATININE 1.20  --  1.07 0.97  CALCIUM 8.8*  --  8.7* 8.3*  MG  --  1.9  --   --    GFR: Estimated Creatinine Clearance: 61.5 mL/min (by C-G formula based on Cr of 0.97).  Liver Function Tests:  Recent Labs Lab 12/05/15 0933  AST 41  ALT 36  ALKPHOS 68  BILITOT 1.0  PROT 5.8*  ALBUMIN 3.2*    Coagulation Profile:  Recent Labs Lab 12/05/15 1358  INR 1.29   Thyroid Function Tests:  Recent Labs  12/06/15 1112  TSH 4.961*   Urine analysis:    Component Value Date/Time   COLORURINE YELLOW 09/21/2015 1211   APPEARANCEUR CLEAR 09/21/2015 1211   LABSPEC 1.025 09/21/2015 1211   PHURINE 5.5 09/21/2015 1211   GLUCOSEU NEGATIVE 09/21/2015 1211   HGBUR NEGATIVE 09/21/2015 1211   BILIRUBINUR NEGATIVE 09/21/2015 1211   KETONESUR NEGATIVE 09/21/2015 1211   PROTEINUR NEGATIVE 09/21/2015 1211   UROBILINOGEN 0.2 06/22/2009 0723   NITRITE NEGATIVE 09/21/2015 1211   LEUKOCYTESUR NEGATIVE 09/21/2015 1211     Radiology Studies: No results found.   Medications:  Scheduled: . enoxaparin (LOVENOX) injection  40 mg Subcutaneous Q24H  . feeding supplement (ENSURE ENLIVE)  237 mL Oral BID BM  . folic acid  1 mg Oral Daily  . furosemide  20 mg Intravenous BID  . gabapentin  200 mg Oral TID  . levothyroxine  50 mcg Oral QAC breakfast  . lisinopril  2.5 mg Oral Daily  . methocarbamol  500 mg Oral BID  . multivitamin with minerals  1 tablet Oral Daily  . potassium chloride  20 mEq Oral Daily  . regadenoson      . sodium chloride flush  3 mL Intravenous Q12H  . thiamine  100 mg Oral Daily   Continuous:  [id number] chloride, acetaminophen, LORazepam **OR** LORazepam, ondansetron (ZOFRAN) IV, sodium chloride flush  Assessment/Plan:  Active Problems:   SOB (shortness of breath)   Atrial flutter (HCC)   Edema   Hypothyroidism   ETOH abuse   Thrombocytopenia (HCC)   Weight loss   Lumbar disc disease   Protein calorie malnutrition (HCC)   Heart failure (HCC)   Elevated brain natriuretic peptide (BNP) level   Paroxysmal atrial fibrillation (HCC)   Acute diastolic congestive heart failure (HCC)   Acute systolic CHF (congestive heart failure) (HCC)   Cardiomyopathy (HCC)   Malnutrition of moderate degree    Acute systolic CHF Ejection fraction is found to be about  20-25%. He also has mild aortic stenosis. Cardiology is following. He is on Lasix. Patient is on ACE inhibitor. Plan is to start carvedilol. Plan is for a nuclear stress test today.  Atrial fibrillation/Atrial flutter Patient remains in atrial fibrillation. Heart rate is better controlled. Chads 2 vasc score is 3. However, due to his significant history of GI bleeding and ongoing alcohol use. He is not considered a candidate for long-term anticoagulation. Carvedilol to be resumed after her stress test today.  History of hypothyroidism Continue Synthroid. TSH is 4.96.  DVT Prophylaxis: Lovenox    Code Status: Full code  Family Communication: Discussed with the patient  Disposition Plan: Await stress test. Await improvement in heart rate. Anticipate discharge in 1-2 days.    LOS: 2 days   Latasha Buczkowski  [facility] Pager [phone] 12/07/2015, 1:10 PM  If 7PM-7AM, please contact night-coverage at [email or web address], password [facility]

## 2015-12-08 ENCOUNTER — Encounter (HOSPITAL_COMMUNITY)
Admission: EM | Disposition: A | Discharge status: Home / Self Care | Payer: Self-pay | Source: Home / Self Care | Attending: Internal Medicine

## 2015-12-08 DIAGNOSIS — I5022 Chronic systolic (congestive) heart failure: Secondary | ICD-10-CM

## 2015-12-08 DIAGNOSIS — I481 Persistent atrial fibrillation: Secondary | ICD-10-CM

## 2015-12-08 HISTORY — PX: CARDIAC CATHETERIZATION: SHX172

## 2015-12-08 LAB — BASIC METABOLIC PANEL
ANION GAP: 8 (ref 5–15)
BUN: 13 mg/dL (ref 6–20)
CHLORIDE: 100 mmol/L — AB (ref 101–111)
CO2: 30 mmol/L (ref 22–32)
Calcium: 8.3 mg/dL — ABNORMAL LOW (ref 8.9–10.3)
Creatinine, Ser: 1.08 mg/dL (ref 0.61–1.24)
GFR calc Af Amer: >60 mL/min (ref >60)
Glucose, Bld: 96 mg/dL (ref 65–99)
Potassium: 4.5 mmol/L (ref 3.5–5.1)
SODIUM: 138 mmol/L (ref 135–145)

## 2015-12-08 SURGERY — LEFT HEART CATH AND CORONARY ANGIOGRAPHY

## 2015-12-08 MED ORDER — FENTANYL CITRATE (PF) 100 MCG/2ML IJ SOLN
INTRAMUSCULAR | Status: AC
  Filled 2015-12-08: qty 2

## 2015-12-08 MED ORDER — SODIUM CHLORIDE 0.9% FLUSH: 3.0000 mL | INTRAVENOUS | Status: DC | PRN

## 2015-12-08 MED ORDER — VERAPAMIL HCL 2.5 MG/ML IV SOLN
INTRAVENOUS | Status: AC
  Filled 2015-12-08: qty 2

## 2015-12-08 MED ORDER — SODIUM CHLORIDE 0.9 % IV SOLN
INTRAVENOUS | Status: DC
  Administered 2015-12-08: 10:00:00 via INTRAVENOUS

## 2015-12-08 MED ORDER — HEPARIN (PORCINE) IN NACL 2-0.9 UNIT/ML-% IJ SOLN
INTRAMUSCULAR | Status: DC | PRN
  Administered 2015-12-08: 1500 mL

## 2015-12-08 MED ORDER — MIDAZOLAM HCL 2 MG/2ML IJ SOLN
INTRAMUSCULAR | Status: AC
  Filled 2015-12-08: qty 2

## 2015-12-08 MED ORDER — SODIUM CHLORIDE 0.9% FLUSH: 3.0000 mL | Freq: Two times a day (BID) | INTRAVENOUS | Status: DC

## 2015-12-08 MED ORDER — IOPAMIDOL (ISOVUE-370) INJECTION 76%
INTRAVENOUS | Status: AC
  Filled 2015-12-08: qty 100

## 2015-12-08 MED ORDER — LIDOCAINE HCL (PF) 1 % IJ SOLN
INTRAMUSCULAR | Status: AC
  Filled 2015-12-08: qty 30

## 2015-12-08 MED ORDER — CARVEDILOL 3.125 MG PO TABS
3.1250 mg | ORAL_TABLET | Freq: Two times a day (BID) | ORAL | Status: DC
  Administered 2015-12-08 – 2015-12-09 (×2): 3.125 mg via ORAL
  Filled 2015-12-08 (×2): qty 1

## 2015-12-08 MED ORDER — SODIUM CHLORIDE 0.9 % IV SOLN: 250.0000 mL | INTRAVENOUS | Status: DC | PRN

## 2015-12-08 MED ORDER — ONDANSETRON HCL 4 MG/2ML IJ SOLN: 4.0000 mg | Freq: Four times a day (QID) | INTRAMUSCULAR | Status: DC | PRN

## 2015-12-08 MED ORDER — FENTANYL CITRATE (PF) 100 MCG/2ML IJ SOLN
INTRAMUSCULAR | Status: DC | PRN
  Administered 2015-12-08: 25 ug via INTRAVENOUS

## 2015-12-08 MED ORDER — MORPHINE SULFATE (PF) 2 MG/ML IV SOLN: 2.0000 mg | INTRAVENOUS | Status: DC | PRN

## 2015-12-08 MED ORDER — SODIUM CHLORIDE 0.9 % IV SOLN
INTRAVENOUS | Status: AC
  Administered 2015-12-08: 17:00:00 via INTRAVENOUS

## 2015-12-08 MED ORDER — ACETAMINOPHEN 325 MG PO TABS
650.0000 mg | ORAL_TABLET | ORAL | Status: DC | PRN
  Administered 2015-12-08: 650 mg via ORAL
  Filled 2015-12-08: qty 2

## 2015-12-08 MED ORDER — HEPARIN (PORCINE) IN NACL 2-0.9 UNIT/ML-% IJ SOLN
INTRAMUSCULAR | Status: AC
  Filled 2015-12-08: qty 1500

## 2015-12-08 MED ORDER — LIDOCAINE HCL (PF) 1 % IJ SOLN
INTRAMUSCULAR | Status: DC | PRN
  Administered 2015-12-08: 3 mL via SUBCUTANEOUS

## 2015-12-08 MED ORDER — MIDAZOLAM HCL 2 MG/2ML IJ SOLN
INTRAMUSCULAR | Status: DC | PRN
  Administered 2015-12-08: 1 mg via INTRAVENOUS

## 2015-12-08 MED ORDER — NITROGLYCERIN 1 MG/10 ML FOR IR/CATH LAB
INTRA_ARTERIAL | Status: AC
  Filled 2015-12-08: qty 10

## 2015-12-08 MED ORDER — IOPAMIDOL (ISOVUE-370) INJECTION 76%
INTRAVENOUS | Status: DC | PRN
  Administered 2015-12-08: 65 mL via INTRA_ARTERIAL

## 2015-12-08 SURGICAL SUPPLY — 12 items
CATH INFINITI 5FR MULTPACK ANG (CATHETERS) ×2 IMPLANT
CATH INFINITI 6F ANG MULTIPACK (CATHETERS) IMPLANT
DEVICE CLOSURE MYNXGRIP 5F (Vascular Products) ×2 IMPLANT
GLIDESHEATH SLEND A-KIT 6F 22G (SHEATH) ×2 IMPLANT
KIT HEART LEFT (KITS) ×3 IMPLANT
PACK CARDIAC CATHETERIZATION (CUSTOM PROCEDURE TRAY) ×3 IMPLANT
SHEATH PINNACLE 5F 10CM (SHEATH) ×2 IMPLANT
SYR MEDRAD MARK V 150ML (SYRINGE) ×3 IMPLANT
TRANSDUCER W/STOPCOCK (MISCELLANEOUS) ×3 IMPLANT
TUBING CIL FLEX 10 FLL-RA (TUBING) ×3 IMPLANT
WIRE EMERALD 3MM-J .035X150CM (WIRE) ×2 IMPLANT
WIRE SAFE-T 1.5MM-J .035X260CM (WIRE) ×2 IMPLANT

## 2015-12-08 NOTE — Progress Notes (Signed)
[facility] PROGRESS NOTE  Lucas Fuller [id number] DOB: Mar 27, 1941 DOA: 12/05/2015  PCP: Merrilee Seashore, MD  Brief HPI: 75yo African-American male with a past medical history of hypothyroidism, COPD, nonicteric tobacco use, alcoholism, presented with shortness of breath and leg edema. He was admitted with congestive heart failure and was found to have atrial fibrillation.  Past medical history:  Past Medical History  Diagnosis Date  . Thyroid disease   . Bladder cancer [facility])     Consultants: Cardiology  Procedures:  Transthoracic echocardiogram Study Conclusions - Left ventricle: The cavity size was normal. There was mild concentric hypertrophy. Systolic function was severely reduced. The estimated ejection fraction was in the range of 20% to 25%. Diffuse hypokinesis. The study was not technically sufficient to allow evaluation of LV diastolic dysfunction due to atrial fibrillation. - Aortic valve: Possibly bicuspid; moderately thickened, moderately calcified leaflets. Valve mobility was restricted. There was mild stenosis. There was trivial regurgitation. Mean gradient (S): 5 mm Hg. Peak gradient (S): 9 mm Hg. Valve area (VTI): 0.78 cm^2. Valve area (Vmax): 1.09 cm^2. Valve area (Vmean): 1.09 cm^2. - Aortic root: The aortic root was normal in size. - Mitral valve: Structurally normal valve. There was mild regurgitation. - Left atrium: The atrium was moderately dilated. - Right ventricle: The cavity size was moderately decreased. Wall thickness was normal. Systolic function was normal. - Right atrium: The atrium was moderately dilated. - Tricuspid valve: There was mild regurgitation. - Pulmonic valve: There was no regurgitation. - Pulmonary arteries: Systolic pressure was mildly increased. PA peak pressure: 37 mm Hg (S). - Inferior vena cava: The vessel was normal in size. - Pericardium, extracardiac: There was no pericardial effusion.  Nuclear stress  test IMPRESSION: 1. Apical infarct or scar. No ischemia. 2. Left ventricular dilatation and Global hypokinesis. 3. Left ventricular ejection fraction 20% 4. High-risk stress test findings.   Cardiac catheterization   Antibiotics: None  Subjective: Patient feels well. Denies any chest pain or shortness of breath. Is frustrated that he has to undergo another test.   Objective:  Vital Signs  Filed Vitals:   12/07/15 1306 12/07/15 2050 12/08/15 0512 12/08/15 0600  BP: 103/66 92/66 101/71   Pulse: 109 106 81 101  Temp: 97.7 F (36.5 C) 98.5 F (36.9 C) 98 F (36.7 C)   TempSrc: Oral Oral Oral   Resp: 18 18 18    Height:      Weight:   68.4 kg (150 lb 12.7 oz)   SpO2: 98% 95% 95%     Intake/Output Summary (Last 24 hours) at 12/08/15 0936 Last data filed at 12/08/15 0928  Gross per 24 hour  Intake    600 ml  Output   2250 ml  Net  -1650 ml   Filed Weights   12/06/15 0508 12/07/15 0527 12/08/15 0512  Weight: 69.219 kg (152 lb 9.6 oz) 69.4 kg (153 lb) 68.4 kg (150 lb 12.7 oz)    General appearance: alert, cooperative, appears stated age and no distress Resp: Good air entry bilaterally. No crackles or wheezing. No rhonchi. Cardio: S1, S2 is irregularly irregular. No S3, S4. No rubs, or bruit.  GI: soft, non-tender; bowel sounds normal; no masses,  no organomegaly Extremities: Minimal pedal edema Neurologic: Awake and alert. Oriented 3. No focal neurological deficits.  Lab Results:  Data Reviewed: I have personally reviewed following labs and imaging studies  CBC:  Recent Labs Lab 12/05/15 0933 12/06/15 0349 12/07/15 0324  WBC 8.3 5.7 5.2  HGB 14.7  14.8 14.0  HCT 42.7 43.4 41.9  MCV 98.8 98.2 97.7  PLT 122* 116* [id number]*   Basic Metabolic Panel:  Recent Labs Lab 12/05/15 0933 12/05/15 1358 12/06/15 0349 12/07/15 0324 12/08/15 0331  NA 140  --  142 141 138  K 3.8  --  3.9 3.9 4.5  CL 106  --  104 104 100*  CO2 26  --  29 26 30   GLUCOSE 105*  --   105* 94 96  BUN 15  --  17 13 13   CREATININE 1.20  --  1.07 0.97 1.08  CALCIUM 8.8*  --  8.7* 8.3* 8.3*  MG  --  1.9  --   --   --    GFR: Estimated Creatinine Clearance: 55.3 mL/min (by C-G formula based on Cr of 1.08).  Liver Function Tests:  Recent Labs Lab 12/05/15 0933  AST 41  ALT 36  ALKPHOS 68  BILITOT 1.0  PROT 5.8*  ALBUMIN 3.2*   Coagulation Profile:  Recent Labs Lab 12/05/15 1358  INR 1.29   Thyroid Function Tests:  Recent Labs  12/06/15 1112  TSH 4.961*     Radiology Studies: Nm Myocar Multi W/spect W/wall Motion / Ef  12/07/2015  CLINICAL DATA:  chest pain, new onset atrial fibrillation, previous ethanol and tobacco abuse. Shortness of breath. EXAM: MYOCARDIAL IMAGING WITH SPECT (REST AND PHARMACOLOGIC-STRESS) GATED LEFT VENTRICULAR WALL MOTION STUDY LEFT VENTRICULAR EJECTION FRACTION TECHNIQUE: Standard myocardial SPECT imaging was performed after resting intravenous injection of 10 mCi Tc-58m sestamibi. Subsequently, intravenous infusion of Lexiscan was performed under the supervision of the Cardiology staff. At peak effect of the drug, 30 mCi Tc-9m sestamibi was injected intravenously and standard myocardial SPECT imaging was performed. Quantitative gated imaging was also performed to evaluate left ventricular wall motion, and estimate left ventricular ejection fraction. COMPARISON:  None. FINDINGS: Perfusion: Small area of moderately decreased activity in the apex on both rest and stress studies. No suggestion of ischemia. Wall Motion: Global hypokinesis with mild left ventricular dilatation. Left Ventricular Ejection Fraction: 2 % End diastolic volume [id number] ml End systolic volume [id number] ml IMPRESSION: 1. Apical infarct or scar.  No ischemia. 2. Left ventricular dilatation and Global hypokinesis. 3. Left ventricular ejection fraction 20% 4. High-risk stress test findings*. *2012 Appropriate Use Criteria for Coronary Revascularization Focused Update: J Am Coll  Cardiol. N6492421. http://content.airportbarriers.com.aspx?articleid=1201161 Electronically Signed   By: Lucrezia Europe M.D.   On: 12/07/2015 17:36     Medications:  Scheduled: . carvedilol  3.125 mg Oral BID WC  . feeding supplement (ENSURE ENLIVE)  237 mL Oral BID BM  . folic acid  1 mg Oral Daily  . furosemide  20 mg Intravenous BID  . gabapentin  200 mg Oral TID  . levothyroxine  50 mcg Oral QAC breakfast  . lisinopril  2.5 mg Oral Daily  . methocarbamol  500 mg Oral BID  . multivitamin with minerals  1 tablet Oral Daily  . potassium chloride  20 mEq Oral Daily  . sodium chloride flush  3 mL Intravenous Q12H  . sodium chloride flush  3 mL Intravenous Q12H  . thiamine  100 mg Oral Daily   Continuous: . sodium chloride     [id number] chloride, sodium chloride, acetaminophen, LORazepam **OR** LORazepam, ondansetron (ZOFRAN) IV, sodium chloride flush, sodium chloride flush  Assessment/Plan:  Active Problems:   SOB (shortness of breath)   Atrial flutter (HCC)   Edema   Hypothyroidism   ETOH abuse  Thrombocytopenia (HCC)   Weight loss   Lumbar disc disease   Protein calorie malnutrition (HCC)   Heart failure (HCC)   Elevated brain natriuretic peptide (BNP) level   Paroxysmal atrial fibrillation (HCC)   Acute diastolic congestive heart failure (HCC)   Acute systolic CHF (congestive heart failure) (HCC)   Cardiomyopathy (HCC)   Malnutrition of moderate degree    Acute systolic CHF Ejection fraction is found to be about 20-25%. He also has mild aortic stenosis. Cardiology is following. He is on Intravenous Lasix. Patient is on ACE inhibitor. Patient has also been started on carvedilol. Nuclear stress test was high risk. Plan is for cardiac catheterization today.   Atrial fibrillation/Atrial flutter Patient remains in atrial fibrillation. Heart rate is better controlled. Chads 2 vasc score is 3. However, due to his significant history of GI bleeding and ongoing  alcohol use, he is not considered a candidate for long-term anticoagulation. Continue carvedilol.  History of hypothyroidism Continue Synthroid. TSH is 4.96.  DVT Prophylaxis: Lovenox    Code Status: Full code  Family Communication: Discussed with the patient  Disposition Plan: Await cardiac catheterization.    LOS: 3 days   [place] [facility] Pager [phone] 12/08/2015, 9:36 AM  If 7PM-7AM, please contact night-coverage at [email or web address], password [facility]

## 2015-12-08 NOTE — Progress Notes (Signed)
SUBJECTIVE: Dyspnea improved. NO chest pain.   Tele: atrial fib  BP 101/71 mmHg  Pulse 101  Temp(Src) 98 F (36.7 C) (Oral)  Resp 18  Ht 5\' 7"  (1.702 m)  Wt 150 lb 12.7 oz (68.4 kg)  BMI 23.61 kg/m2  SpO2 95%  Intake/Output Summary (Last 24 hours) at 12/08/15 0849 Last data filed at 12/08/15 0600  Gross per 24 hour  Intake    600 ml  Output   2250 ml  Net  -1650 ml    PHYSICAL EXAM General: Thin elderly male, NAD.   Psych:  Good affect, responds appropriately Neck: No JVD. No masses noted.  Lungs: Clear bilaterally with no wheezes or rhonci noted.  Heart: Irreg irreg with no murmurs noted. Abdomen: Bowel sounds are present. Soft, non-tender.  Extremities: No lower extremity edema.   LABS: Basic Metabolic Panel:  Recent Labs  12/05/15 1358  12/07/15 0324 12/08/15 0331  NA  --   < > 141 138  K  --   < > 3.9 4.5  CL  --   < > 104 100*  CO2  --   < > 26 30  GLUCOSE  --   < > 94 96  BUN  --   < > 13 13  CREATININE  --   < > 0.97 1.08  CALCIUM  --   < > 8.3* 8.3*  MG 1.9  --   --   --   < > = values in this interval not displayed. CBC:  Recent Labs  12/06/15 0349 12/07/15 0324  WBC 5.7 5.2  HGB 14.8 14.0  HCT 43.4 41.9  MCV 98.2 97.7  PLT 116* 111*   Current Meds: . enoxaparin (LOVENOX) injection  40 mg Subcutaneous Q24H  . feeding supplement (ENSURE ENLIVE)  237 mL Oral BID BM  . folic acid  1 mg Oral Daily  . furosemide  20 mg Intravenous BID  . gabapentin  200 mg Oral TID  . levothyroxine  50 mcg Oral QAC breakfast  . lisinopril  2.5 mg Oral Daily  . methocarbamol  500 mg Oral BID  . multivitamin with minerals  1 tablet Oral Daily  . potassium chloride  20 mEq Oral Daily  . sodium chloride flush  3 mL Intravenous Q12H  . thiamine  100 mg Oral Daily     ASSESSMENT AND PLAN:  1. Cardiomyopathy/Acute Systolic CHF: 2D Echo revealed severely reduced LV systolic function with an EF of 20-25%. Diffuse hypokinesis noted. Mild AS, mild MR. No  prior echos for comparison. ? Tachycardia mediated from atrial fib/flutter vs ischemic etiology vs ETOH abuse. High risk stress test 12/07/15. No clear ischemia but global LV dysfunction. I think it is important to define his coronary anatomy. Will plan left heart cath today with possible PCI. Risks and benefits reviewed with patient. In regards to volume status, he is diuresing well. -1.6L last 24 hours. Will change to po Lasix. Continue Ace-inh and beta blocker.  Will not start ASA (he has an allergy with hives). If he needs PCI, would use bare metal stent and Plavix.   2. Atrial Fib/Flutter: HR is controlled. Continue Coreg. Despite CHA2DS2 VASc score, he seems to be a very poor candidate for anti-coagulation given his prior GI bleeding and ongoing alcohol abuse, thus risk> benefit. Given he is not a candidate for a/c, would favor rate control strategy. No antiarrythmics as we will need to avoid chemical conversion.    Lucas Fuller  Lucas Fuller  5/18/20178:49 AM

## 2015-12-08 NOTE — H&P (View-Only) (Signed)
SUBJECTIVE: Dyspnea improved. NO chest pain.   Tele: atrial fib  BP 101/71 mmHg  Pulse 101  Temp(Src) 98 F (36.7 C) (Oral)  Resp 18  Ht 5\' 7"  (1.702 m)  Wt 150 lb 12.7 oz (68.4 kg)  BMI 23.61 kg/m2  SpO2 95%  Intake/Output Summary (Last 24 hours) at 12/08/15 0849 Last data filed at 12/08/15 0600  Gross per 24 hour  Intake    600 ml  Output   2250 ml  Net  -1650 ml    PHYSICAL EXAM General: Thin elderly male, NAD.   Psych:  Good affect, responds appropriately Neck: No JVD. No masses noted.  Lungs: Clear bilaterally with no wheezes or rhonci noted.  Heart: Irreg irreg with no murmurs noted. Abdomen: Bowel sounds are present. Soft, non-tender.  Extremities: No lower extremity edema.   LABS: Basic Metabolic Panel:  Recent Labs  12/05/15 1358  12/07/15 0324 12/08/15 0331  NA  --   < > 141 138  K  --   < > 3.9 4.5  CL  --   < > 104 100*  CO2  --   < > 26 30  GLUCOSE  --   < > 94 96  BUN  --   < > 13 13  CREATININE  --   < > 0.97 1.08  CALCIUM  --   < > 8.3* 8.3*  MG 1.9  --   --   --   < > = values in this interval not displayed. CBC:  Recent Labs  12/06/15 0349 12/07/15 0324  WBC 5.7 5.2  HGB 14.8 14.0  HCT 43.4 41.9  MCV 98.2 97.7  PLT 116* 111*   Current Meds: . enoxaparin (LOVENOX) injection  40 mg Subcutaneous Q24H  . feeding supplement (ENSURE ENLIVE)  237 mL Oral BID BM  . folic acid  1 mg Oral Daily  . furosemide  20 mg Intravenous BID  . gabapentin  200 mg Oral TID  . levothyroxine  50 mcg Oral QAC breakfast  . lisinopril  2.5 mg Oral Daily  . methocarbamol  500 mg Oral BID  . multivitamin with minerals  1 tablet Oral Daily  . potassium chloride  20 mEq Oral Daily  . sodium chloride flush  3 mL Intravenous Q12H  . thiamine  100 mg Oral Daily     ASSESSMENT AND PLAN:  1. Cardiomyopathy/Acute Systolic CHF: 2D Echo revealed severely reduced LV systolic function with an EF of 20-25%. Diffuse hypokinesis noted. Mild AS, mild MR. No  prior echos for comparison. ? Tachycardia mediated from atrial fib/flutter vs ischemic etiology vs ETOH abuse. High risk stress test 12/07/15. No clear ischemia but global LV dysfunction. I think it is important to define his coronary anatomy. Will plan left heart cath today with possible PCI. Risks and benefits reviewed with patient. In regards to volume status, he is diuresing well. -1.6L last 24 hours. Will change to po Lasix. Continue Ace-inh and beta blocker.  Will not start ASA (he has an allergy with hives). If he needs PCI, would use bare metal stent and Plavix.   2. Atrial Fib/Flutter: HR is controlled. Continue Coreg. Despite CHA2DS2 VASc score, he seems to be a very poor candidate for anti-coagulation given his prior GI bleeding and ongoing alcohol abuse, thus risk> benefit. Given he is not a candidate for a/c, would favor rate control strategy. No antiarrythmics as we will need to avoid chemical conversion.    Harrell Gave  McAlhany  5/18/20178:49 AM

## 2015-12-08 NOTE — Interval H&P Note (Signed)
Cath Lab Visit (complete for each Cath Lab visit)  Clinical Evaluation Leading to the Procedure:   ACS: No.  Non-ACS:    Anginal Classification: CCS I  Anti-ischemic medical therapy: No Therapy  Non-Invasive Test Results: High-risk stress test findings: cardiac mortality >3%/year  Prior CABG: No previous CABG      History and Physical Interval Note:  12/08/2015 3:29 PM  Lucas Fuller  has presented today for surgery, with the diagnosis of cm  The various methods of treatment have been discussed with the patient and family. After consideration of risks, benefits and other options for treatment, the patient has consented to  Procedure(s): Left Heart Cath and Coronary Angiography (N/A) as a surgical intervention .  The patient's history has been reviewed, patient examined, no change in status, stable for surgery.  I have reviewed the patient's chart and labs.  Questions were answered to the patient's satisfaction.     Quay Burow

## 2015-12-08 NOTE — Care Management Important Message (Signed)
Important Message  Patient Details  Name: ZION KAERCHER MRN: [id number] Date of Birth: [birth year]   Medicare Important Message Given:  Yes    Dawayne Patricia, RN 12/08/2015, 10:29 AM

## 2015-12-08 NOTE — Progress Notes (Signed)
Pt arrived back to unit from cath lab @ 1630.  VS/pulse and extremity/pulse check completed as ordered.  Pt VS remained stable and right foot/leg pulse moderate palpable.  Pt denies any tingly or numbness to right foot/leg.  Pt denies any pain.  Cath site dressing remains intact.  Pt remained on bedrest x 2 hours as ordered.   Pt bedrest completed @ 1830.  Pt advised he can ambulate at this time.  Pt indicates understanding.  Will cont to monitor.

## 2015-12-09 ENCOUNTER — Encounter (HOSPITAL_COMMUNITY): Payer: Self-pay | Admitting: Cardiovascular Disease

## 2015-12-09 DIAGNOSIS — I4891 Unspecified atrial fibrillation: Secondary | ICD-10-CM

## 2015-12-09 LAB — BASIC METABOLIC PANEL
Anion gap: 9 (ref 5–15)
BUN: 11 mg/dL (ref 6–20)
CALCIUM: 8 mg/dL — AB (ref 8.9–10.3)
CO2: 26 mmol/L (ref 22–32)
Chloride: 102 mmol/L (ref 101–111)
Creatinine, Ser: 1.09 mg/dL (ref 0.61–1.24)
GFR calc non Af Amer: >60 mL/min (ref >60)
Glucose, Bld: 94 mg/dL (ref 65–99)
Potassium: 4.1 mmol/L (ref 3.5–5.1)
Sodium: 137 mmol/L (ref 135–145)

## 2015-12-09 LAB — CBC
HCT: 42.2 % (ref 39.0–52.0)
Hemoglobin: 13.9 g/dL (ref 13.0–17.0)
MCH: 32.3 pg (ref 26.0–34.0)
MCHC: 32.9 g/dL (ref 30.0–36.0)
MCV: 98.1 fL (ref 78.0–100.0)
Platelets: 123 10*3/uL — ABNORMAL LOW (ref 150–400)
RBC: 4.3 MIL/uL (ref 4.22–5.81)
RDW: 14.9 % (ref 11.5–15.5)
WBC: 5.3 10*3/uL (ref 4.0–10.5)

## 2015-12-09 MED ORDER — LISINOPRIL 2.5 MG PO TABS: 2.5000 mg | ORAL_TABLET | Freq: Every day | ORAL | Status: DC

## 2015-12-09 MED ORDER — THIAMINE HCL 100 MG PO TABS: 100.0000 mg | ORAL_TABLET | Freq: Every day | ORAL | Status: DC

## 2015-12-09 MED ORDER — ADULT MULTIVITAMIN W/MINERALS CH: 1.0000 | ORAL_TABLET | Freq: Every day | ORAL | Status: DC

## 2015-12-09 MED ORDER — FUROSEMIDE 40 MG PO TABS: 40.0000 mg | ORAL_TABLET | Freq: Every day | ORAL | Status: DC

## 2015-12-09 MED ORDER — CARVEDILOL 3.125 MG PO TABS: 3.1250 mg | ORAL_TABLET | Freq: Two times a day (BID) | ORAL | Status: DC

## 2015-12-09 MED ORDER — FUROSEMIDE 40 MG PO TABS
40.0000 mg | ORAL_TABLET | Freq: Every day | ORAL | Status: DC
  Filled 2015-12-09: qty 1

## 2015-12-09 MED ORDER — APIXABAN 5 MG PO TABS: 5.0000 mg | ORAL_TABLET | Freq: Two times a day (BID) | ORAL | Status: DC

## 2015-12-09 MED ORDER — APIXABAN 5 MG PO TABS
5.0000 mg | ORAL_TABLET | Freq: Two times a day (BID) | ORAL | Status: DC
  Administered 2015-12-09: 5 mg via ORAL
  Filled 2015-12-09: qty 1

## 2015-12-09 MED ORDER — POTASSIUM CHLORIDE CRYS ER 20 MEQ PO TBCR: 20.0000 meq | EXTENDED_RELEASE_TABLET | Freq: Every day | ORAL | Status: DC

## 2015-12-09 MED FILL — Nitroglycerin IV Soln 100 MCG/ML in D5W: INTRA_ARTERIAL | Qty: 10 | Status: AC

## 2015-12-09 MED FILL — Verapamil HCl IV Soln 2.5 MG/ML: INTRAVENOUS | Qty: 2 | Status: AC

## 2015-12-09 NOTE — Discharge Summary (Signed)
[facility]  Physician Discharge Summary   Patient ID: LATASHA HOLGATE MRN: [id number] DOB/AGE: 10/05/1940 75 y.o.  Admit date: 12/05/2015 Discharge date: 12/09/2015  PCP: Merrilee Seashore, MD  DISCHARGE DIAGNOSES:  Active Problems:   SOB (shortness of breath)   Atrial flutter (HCC)   Edema   Hypothyroidism   ETOH abuse   Thrombocytopenia (HCC)   Weight loss   Lumbar disc disease   Protein calorie malnutrition (HCC)   Heart failure (HCC)   Elevated brain natriuretic peptide (BNP) level   Paroxysmal atrial fibrillation (HCC)   Acute diastolic congestive heart failure (HCC)   Acute systolic CHF (congestive heart failure) (HCC)   Cardiomyopathy (HCC)   Malnutrition of moderate degree   RECOMMENDATIONS FOR OUTPATIENT FOLLOW UP: 1. Close Follow-up with cardiology 2. Patient strongly urged to stop drinking alcohol   DISCHARGE CONDITION: fair  Diet recommendation: Heart healthy  Filed Weights   12/08/15 0512 12/08/15 0919 12/09/15 0551  Weight: 68.4 kg (150 lb 12.7 oz) 68.4 kg (150 lb 12.7 oz) 68.3 kg (150 lb 9.2 oz)    INITIAL HISTORY: 75yo African-American male with a past medical history of hypothyroidism, COPD, nonicteric tobacco use, alcoholism, presented with shortness of breath and leg edema. He was admitted with congestive heart failure and was found to have atrial fibrillation.  Consultants: Cardiology  Procedures:  Transthoracic echocardiogram Study Conclusions - Left ventricle: The cavity size was normal. There was mild concentric hypertrophy. Systolic function was severely reduced. The estimated ejection fraction was in the range of 20% to 25%. Diffuse hypokinesis. The study was not technically sufficient to allow evaluation of LV diastolic dysfunction due to atrial fibrillation. - Aortic valve: Possibly bicuspid; moderately thickened, moderately calcified leaflets. Valve mobility was restricted. There was mild stenosis. There was trivial  regurgitation. Mean gradient (S): 5 mm Hg. Peak gradient (S): 9 mm Hg. Valve area (VTI): 0.78 cm^2. Valve area (Vmax): 1.09 cm^2. Valve area (Vmean): 1.09 cm^2. - Aortic root: The aortic root was normal in size. - Mitral valve: Structurally normal valve. There was mild regurgitation. - Left atrium: The atrium was moderately dilated. - Right ventricle: The cavity size was moderately decreased. Wall thickness was normal. Systolic function was normal. - Right atrium: The atrium was moderately dilated. - Tricuspid valve: There was mild regurgitation. - Pulmonic valve: There was no regurgitation. - Pulmonary arteries: Systolic pressure was mildly increased. PA peak pressure: 37 mm Hg (S). - Inferior vena cava: The vessel was normal in size. - Pericardium, extracardiac: There was no pericardial effusion.  Nuclear stress test IMPRESSION: 1. Apical infarct or scar. No ischemia. 2. Left ventricular dilatation and Global hypokinesis. 3. Left ventricular ejection fraction 20% 4. High-risk stress test findings.   Cardiac catheterization IMPRESSION:Mr. Lovena Le has a left dominant system, clean coronary arteries and a dilated severely hypocontractile LV consistent with nonischemic cardiomyopathy.    HOSPITAL COURSE:   Acute systolic CHF Ejection fraction is found to be about 20-25%. He also has mild aortic stenosis. Patient was seen by cardiology. He was placed on IV Lasix. ACE inhibitor was initiated as well as carvedilol. He underwent nuclear stress test, which was high risk. Underwent cardiac catheterization which showed clean coronary arteries.   Atrial fibrillation/Atrial flutter Patient remains in atrial fibrillation. Heart rate is better controlled on carvedilol. Chads 2 vasc score is 3. However, due to his significant history of GI bleeding and ongoing alcohol use, he was initially not considered a candidate for long-term anticoagulation. However, patient promises to stop drinking  alcohol. In  view of this and in view of his diminished EF. He is considered at high risk to develop thromboembolism. Hence, he was placed on oral anticoagulation by cardiology.   History of hypothyroidism Continue Synthroid. TSH is 4.96.  Alcoholism Patient did not have any signs or symptoms of withdrawal during this hospitalization. Patient promises to stop drinking alcohol.  Patient feels well. He wants to go home. He has ambulated without any difficulties. Discussed with cardiology. Okay for discharge home today.   PERTINENT LABS:  The results of significant diagnostics from this hospitalization (including imaging, microbiology, ancillary and laboratory) are listed below for reference.      Labs: Basic Metabolic Panel:  Recent Labs Lab 12/05/15 0933 12/05/15 1358 12/06/15 0349 12/07/15 0324 12/08/15 0331 12/09/15 0352  NA 140  --  142 141 138 137  K 3.8  --  3.9 3.9 4.5 4.1  CL 106  --  104 104 100* 102  CO2 26  --  29 26 30 26   GLUCOSE 105*  --  105* 94 96 94  BUN 15  --  17 13 13 11   CREATININE 1.20  --  1.07 0.97 1.08 1.09  CALCIUM 8.8*  --  8.7* 8.3* 8.3* 8.0*  MG  --  1.9  --   --   --   --    Liver Function Tests:  Recent Labs Lab 12/05/15 0933  AST 41  ALT 36  ALKPHOS 68  BILITOT 1.0  PROT 5.8*  ALBUMIN 3.2*   CBC:  Recent Labs Lab 12/05/15 0933 12/06/15 0349 12/07/15 0324 12/09/15 0352  WBC 8.3 5.7 5.2 5.3  HGB 14.7 14.8 14.0 13.9  HCT 42.7 43.4 41.9 42.2  MCV 98.8 98.2 97.7 98.1  PLT 122* 116* 111* 123*   BNP: BNP (last 3 results)  Recent Labs  12/05/15 0933  BNP 971.0*    IMAGING STUDIES Dg Chest 2 View  12/05/2015  CLINICAL DATA:  Shortness of breath for 2 weeks EXAM: CHEST  2 VIEW COMPARISON:  09/21/2015 FINDINGS: Cardiac shadow is mildly enlarged. Small bilateral pleural effusions are noted as well as bibasilar atelectasis. No acute bony abnormality is seen. IMPRESSION: Small bilateral pleural effusions and bibasilar atelectasis.  Electronically Signed   By: Inez Catalina M.D.   On: 12/05/2015 10:00   Nm Myocar Multi W/spect W/wall Motion / Ef  12/07/2015  CLINICAL DATA:  chest pain, new onset atrial fibrillation, previous ethanol and tobacco abuse. Shortness of breath. EXAM: MYOCARDIAL IMAGING WITH SPECT (REST AND PHARMACOLOGIC-STRESS) GATED LEFT VENTRICULAR WALL MOTION STUDY LEFT VENTRICULAR EJECTION FRACTION TECHNIQUE: Standard myocardial SPECT imaging was performed after resting intravenous injection of 10 mCi Tc-71m sestamibi. Subsequently, intravenous infusion of Lexiscan was performed under the supervision of the Cardiology staff. At peak effect of the drug, 30 mCi Tc-10m sestamibi was injected intravenously and standard myocardial SPECT imaging was performed. Quantitative gated imaging was also performed to evaluate left ventricular wall motion, and estimate left ventricular ejection fraction. COMPARISON:  None. FINDINGS: Perfusion: Small area of moderately decreased activity in the apex on both rest and stress studies. No suggestion of ischemia. Wall Motion: Global hypokinesis with mild left ventricular dilatation. Left Ventricular Ejection Fraction: 2 % End diastolic volume [id number] ml End systolic volume [id number] ml IMPRESSION: 1. Apical infarct or scar.  No ischemia. 2. Left ventricular dilatation and Global hypokinesis. 3. Left ventricular ejection fraction 20% 4. High-risk stress test findings*. *2012 Appropriate Use Criteria for Coronary Revascularization Focused Update: J Am  Coll Cardiol. [id number]. http://content.airportbarriers.com.aspx?articleid=1201161 Electronically Signed   By: Lucrezia Europe M.D.   On: 12/07/2015 17:36    DISCHARGE EXAMINATION: Filed Vitals:   12/08/15 1815 12/08/15 2039 12/09/15 0547 12/09/15 0551  BP: 116/80 92/51 107/91   Pulse: 78 102 56   Temp:  97.9 F (36.6 C) 97.9 F (36.6 C)   TempSrc:  Axillary Oral   Resp:  20 18   Height:      Weight:    68.3 kg (150 lb 9.2 oz)  SpO2:  95% 98%     General appearance: alert, cooperative, appears stated age and no distress Resp: clear to auscultation bilaterally Cardio: S1, S2 is irregularly irregular. GI: soft, non-tender; bowel sounds normal; no masses,  no organomegaly Extremities: extremities normal, atraumatic, no cyanosis or edema  DISPOSITION: Home  Discharge Instructions    (HEART FAILURE PATIENTS) Call MD:  Anytime you have any of the following symptoms: 1) 3 pound weight gain in 24 hours or 5 pounds in 1 week 2) shortness of breath, with or without a dry hacking cough 3) swelling in the hands, feet or stomach 4) if you have to sleep on extra pillows at night in order to breathe.    Complete by:  As directed      AMB Referral to [place]    Complete by:  As directed   Please assign to [place] for disease and symptom management for CHF. Currently at [facility]. Written consent obtained. Please call with questions. Thanks. Marthenia Rolling, MSN-Ed, RN,BSN [facility] [id number]  Reason for consult:  Please assign to [facility] RNCM  Diagnoses of:   COPD/ Pneumonia Heart Failure    Expected date of contact:  1-3 days (reserved for hospital discharges)     Call MD for:  difficulty breathing, headache or visual disturbances    Complete by:  As directed      Call MD for:  extreme fatigue    Complete by:  As directed      Call MD for:  persistant dizziness or light-headedness    Complete by:  As directed      Call MD for:  persistant nausea and vomiting    Complete by:  As directed      Call MD for:  severe uncontrolled pain    Complete by:  As directed      Call MD for:  temperature >100.4    Complete by:  As directed      Diet - low sodium heart healthy    Complete by:  As directed      Discharge instructions    Complete by:  As directed   Please stop drinking alcohol as discussed. Please follow up with your doctors as recommended.  You were cared for by a hospitalist during your hospital stay.  If you have any questions about your discharge medications or the care you received while you were in the hospital after you are discharged, you can call the unit and asked to speak with the hospitalist on call if the hospitalist that took care of you is not available. Once you are discharged, your primary care physician will handle any further medical issues. Please note that NO REFILLS for any discharge medications will be authorized once you are discharged, as it is imperative that you return to your primary care physician (or establish a relationship with a primary care physician if you do not have one) for your aftercare needs so that they  can reassess your need for medications and monitor your lab values. If you do not have a primary care physician, you can call [phone] for a physician referral.     Increase activity slowly    Complete by:  As directed            ALLERGIES:  Allergies  Allergen Reactions  . Asa [Aspirin] Hives     Discharge Medication List as of 12/09/2015  1:03 PM    START taking these medications   Details  apixaban (ELIQUIS) 5 MG TABS tablet Take 1 tablet (5 mg total) by mouth 2 (two) times daily., Starting 12/09/2015, Until Discontinued, Normal    carvedilol (COREG) 3.125 MG tablet Take 1 tablet (3.125 mg total) by mouth 2 (two) times daily with a meal., Starting 12/09/2015, Until Discontinued, Normal    furosemide (LASIX) 40 MG tablet Take 1 tablet (40 mg total) by mouth daily., Starting 12/09/2015, Until Discontinued, Normal    lisinopril (PRINIVIL,ZESTRIL) 2.5 MG tablet Take 1 tablet (2.5 mg total) by mouth daily., Starting 12/09/2015, Until Discontinued, Normal    Multiple Vitamin (MULTIVITAMIN WITH MINERALS) TABS tablet Take 1 tablet by mouth daily., Starting 12/09/2015, Until Discontinued, Print    potassium chloride SA (K-DUR,KLOR-CON) 20 MEQ tablet Take 1 tablet (20 mEq total) by mouth daily., Starting 12/09/2015, Until Discontinued, Normal    thiamine 100  MG tablet Take 1 tablet (100 mg total) by mouth daily., Starting 12/09/2015, Until Discontinued, Normal      CONTINUE these medications which have NOT CHANGED   Details  gabapentin (NEURONTIN) 100 MG capsule Take 200 mg by mouth 3 (three) times daily., Until Discontinued, Historical Med    levothyroxine (SYNTHROID, LEVOTHROID) 50 MCG tablet Take 50 mcg by mouth daily before breakfast., Until Discontinued, Historical Med    methocarbamol (ROBAXIN) 500 MG tablet Take 1 tablet (500 mg total) by mouth 2 (two) times daily., Starting 09/23/2015, Until Discontinued, Print      STOP taking these medications     oxyCODONE-acetaminophen (PERCOCET/ROXICET) 5-325 MG tablet      predniSONE (DELTASONE) 20 MG tablet        Follow-up Information    Follow up with [facility], MD. Schedule an appointment as soon as possible for a visit in 1 week.   Specialty:  Internal Medicine   Why:  post hospitalization follow up   Contact information:   [address] [place] [address] [phone]       Follow up with Angelena Form, PA-C On 12/15/2015.   Specialties:  Cardiology, Radiology   Why:  10:30 AM (cardiiology follow-up)   Contact information:   [address] [place] [phone]       TOTAL DISCHARGE TIME: 35 minutes  [facility] Pager [phone]  12/09/2015, 2:17 PM

## 2015-12-09 NOTE — Discharge Instructions (Signed)
Cardiomyopathy Cardiomyopathy is a long-term (chronic) disease of the heart muscle (myocardium). Over time, the heart becomes abnormally large, thick, or stiff. This makes it harder for the heart to pump blood and can lead to heart failure. There are several types of cardiomyopathy:  Dilated cardiomyopathy. This type causes the ventricles become large and weak.  Hypertrophic cardiomyopathy. This type causes the heart muscle to thicken.  Restrictive cardiomyopathy. This type causes the heart muscle to become rigid and less elastic.  Ischemic cardiomyopathy. This type involves narrowing arteries that cause the walls of the heart get thinner.  Peripartum cardiomyopathy. This type occurs during pregnancy or shortly after pregnancy. CAUSES The cause of cardiomyopathy is often not known. In some cases, it is passed down (inherited) from a family member who also had cardiomyopathy. The disease may develop as a complication of another medical condition. These conditions can include:  Diabetes.  High blood pressure.  Viral infection of the heart.  Heart attack.  Coronary heart disease. RISK FACTORS You may be more likely to develop cardiomyopathy if you:  Have a family history of cardiomyopathy or other heart problems.  Are overweight or obese.  Use illegal drugs.  Abuse alcohol.  Have diabetes.  Have another disease that can cause cardiomyopathy as a complication. SIGNS AND SYMPTOMS Often, cardiomyopathy has no signs or symptoms. If you do have symptoms, they may include:  Shortness of breath, especially during activity.  Fatigue.  An irregular heartbeat (arrhythmia).  Dizziness, light-headedness, or fainting.  Chest pain.  Swelling in the lower leg or ankle. DIAGNOSIS Your health care provider may suspect cardiomyopathy based on your symptoms and medical history. Your health care provider will also do a physical exam. Other tests done may include:  Blood  tests.  Imaging studies of your heart. These may be done using:  X-rays to check if your heart is enlarged.  Echocardiogram to show the size of your heart and how well it pumps.  MRI.  A test to record the electrical activity of your heart (electrocardiogram or ECG).  A test in which you wear a portable device (event monitor) to record your heart's electrical activity while you go about your day.  A test to monitor your heart's activity while you exercise (stress test).   A procedure to check the blood pressure and blood flow in your heart(cardiac catheterization).  Injection of dye into your arteries before imaging studies are taken (angiogram).  Removal of a sample of heart tissue (biopsy). The sample is examined for problems. TREATMENT Treatment depends on the type of cardiomyopathy you have and the severity of your symptoms. If you are not having any symptoms, you might not need treatment. If you need treatment, it may include:  Lifestyle changes.  Quit smoking, if you smoke.  Maintain a healthy weight. Lose weight if directed by your health care provider.  Eat a healthy diet. Include plenty of fruits, vegetables, and whole grains.  Get regular exercise. Ask your health care provider to suggest some activities that are good for you.  Medicine. You may need to take medicine to:  Lower your blood pressure.  Slow down your heart rate.  Keep your heart beating in a steady rhythm.  Clear excess fluids from your body.  Prevent blood clots.  Surgery. You may need surgery to:  Repair a defect.  Remove thickened tissue.  Implant a device to treat serious heart rhythm problems (implantable cardioverter-defibrillator or ICD).  Replace your heart (heart transplant) if all other treatments  have failed (end stage). HOME CARE INSTRUCTIONS  Take medicines only as directed by your health care provider.  Eat a heart-healthy diet. Work with your health care provider or a  registered dietitian to learn about healthy eating options.  Maintain a healthy weight and stay physically active.  Do not use any tobacco products, including cigarettes, chewing tobacco, or electronic cigarettes. If you need help quitting, ask your health care provider.  Work closely with your health care provider to manage chronic conditions, such as diabetes and high blood pressure.  Limit alcohol intake to no more than one drink per day for nonpregnant women and no more than two drinks per day for men. One drink equals 12 ounces of beer, 5 ounces of wine, or 1 ounces of hard liquor.  Try to get at least 7 hours of sleep each night.  Find ways to manage stress.  Keep all follow-up visits as directed by your health care provider. This is important. SEEK MEDICAL CARE IF:  Your symptoms get worse, even after treatment.  You have new symptoms. SEEK IMMEDIATE MEDICAL CARE IF:  You have severe chest pain.  You have shortness of breath.  You cough up pink, bubbly material.  You have sudden sweating.  You feel nauseous and vomit.  You suddenly become light-headed or dizzy.  You feel your heart beating very fast.  It feels like your heart is skipping beats. These symptoms may represent a serious problem that is an emergency. Do not wait to see if the symptoms will go away. Get medical help right away. Call your local emergency services (911 in the [place] Do not drive yourself to the hospital.   This information is not intended to replace advice given to you by your health care provider. Make sure you discuss any questions you have with your health care provider.   Document Released: 09/21/2004 Document Revised: 07/30/2014 Document Reviewed: 12/17/2013 Elsevier Interactive Patient Education 2016 [employer]  Apixaban oral tablets What is this medicine? APIXABAN (a PIX a ban) is an anticoagulant (blood thinner). It is used to lower the chance of stroke in people with a  medical condition called atrial fibrillation. It is also used to treat or prevent blood clots in the lungs or in the veins. This medicine may be used for other purposes; ask your health care provider or pharmacist if you have questions. What should I tell my health care provider before I take this medicine? They need to know if you have any of these conditions: -bleeding disorders -bleeding in the brain -blood in your stools (black or tarry stools) or if you have blood in your vomit -history of stomach bleeding -kidney disease -liver disease -mechanical heart valve -an unusual or allergic reaction to apixaban, other medicines, foods, dyes, or preservatives -pregnant or trying to get pregnant -breast-feeding How should I use this medicine? Take this medicine by mouth with a glass of water. Follow the directions on the prescription label. You can take it with or without food. If it upsets your stomach, take it with food. Take your medicine at regular intervals. Do not take it more often than directed. Do not stop taking except on your doctor's advice. Stopping this medicine may increase your risk of a blot clot. Be sure to refill your prescription before you run out of medicine. Talk to your pediatrician regarding the use of this medicine in children. Special care may be needed. Overdosage: If you think you have taken too much of this medicine  contact a poison control center or emergency room at once. NOTE: This medicine is only for you. Do not share this medicine with others. What if I miss a dose? If you miss a dose, take it as soon as you can. If it is almost time for your next dose, take only that dose. Do not take double or extra doses. What may interact with this medicine? This medicine may interact with the following: -aspirin and aspirin-like medicines -certain medicines for fungal infections like ketoconazole and itraconazole -certain medicines for seizures like carbamazepine and  phenytoin -certain medicines that treat or prevent blood clots like warfarin, enoxaparin, and dalteparin -clarithromycin -NSAIDs, medicines for pain and inflammation, like ibuprofen or naproxen -rifampin -ritonavir -St. John's wort This list may not describe all possible interactions. Give your health care provider a list of all the medicines, herbs, non-prescription drugs, or dietary supplements you use. Also tell them if you smoke, drink alcohol, or use illegal drugs. Some items may interact with your medicine. What should I watch for while using this medicine? Notify your doctor or health care professional and seek emergency treatment if you develop breathing problems; changes in vision; chest pain; severe, sudden headache; pain, swelling, warmth in the leg; trouble speaking; sudden numbness or weakness of the face, arm, or leg. These can be signs that your condition has gotten worse. If you are going to have surgery, tell your doctor or health care professional that you are taking this medicine. Tell your health care professional that you use this medicine before you have a spinal or epidural procedure. Sometimes people who take this medicine have bleeding problems around the spine when they have a spinal or epidural procedure. This bleeding is very rare. If you have a spinal or epidural procedure while on this medicine, call your health care professional immediately if you have back pain, numbness or tingling (especially in your legs and feet), muscle weakness, paralysis, or loss of bladder or bowel control. Avoid sports and activities that might cause injury while you are using this medicine. Severe falls or injuries can cause unseen bleeding. Be careful when using sharp tools or knives. Consider using an Copy. Take special care brushing or flossing your teeth. Report any injuries, bruising, or red spots on the skin to your doctor or health care professional. What side effects may I  notice from receiving this medicine? Side effects that you should report to your doctor or health care professional as soon as possible: -allergic reactions like skin rash, itching or hives, swelling of the face, lips, or tongue -signs and symptoms of bleeding such as bloody or black, tarry stools; red or dark-brown urine; spitting up blood or brown material that looks like coffee grounds; red spots on the skin; unusual bruising or bleeding from the eye, gums, or nose This list may not describe all possible side effects. Call your doctor for medical advice about side effects. You may report side effects to FDA at 1-800-FDA-1088. Where should I keep my medicine? Keep out of the reach of children. Store at room temperature between 20 and 25 degrees C (68 and 77 degrees F). Throw away any unused medicine after the expiration date. NOTE: This sheet is a summary. It may not cover all possible information. If you have questions about this medicine, talk to your doctor, pharmacist, or health care provider.    2016, Elsevier/[employer] Standard. (2013-03-13 11:59:24)  Atrial Fibrillation Atrial fibrillation is a type of irregular or rapid heartbeat (arrhythmia). In atrial  fibrillation, the heart quivers continuously in a chaotic pattern. This occurs when parts of the heart receive disorganized signals that make the heart unable to pump blood normally. This can increase the risk for stroke, heart failure, and other heart-related conditions. There are different types of atrial fibrillation, including:  Paroxysmal atrial fibrillation. This type starts suddenly, and it usually stops on its own shortly after it starts.  Persistent atrial fibrillation. This type often lasts longer than a week. It may stop on its own or with treatment.  Long-lasting persistent atrial fibrillation. This type lasts longer than 12 months.  Permanent atrial fibrillation. This type does not go away. Talk with your health care provider  to learn about the type of atrial fibrillation that you have. CAUSES This condition is caused by some heart-related conditions or procedures, including:  A heart attack.  Coronary artery disease.  Heart failure.  Heart valve conditions.  High blood pressure.  Inflammation of the sac that surrounds the heart (pericarditis).  Heart surgery.  Certain heart rhythm disorders, such as Wolf-Parkinson-White syndrome. Other causes include:  Pneumonia.  Obstructive sleep apnea.  Blockage of an artery in the lungs (pulmonary embolism, or PE).  Lung cancer.  Chronic lung disease.  Thyroid problems, especially if the thyroid is overactive (hyperthyroidism).  Caffeine.  Excessive alcohol use or illegal drug use.  Use of some medicines, including certain decongestants and diet pills. Sometimes, the cause cannot be found. RISK FACTORS This condition is more likely to develop in:  People who are older in age.  People who smoke.  People who have diabetes mellitus.  People who are overweight (obese).  Athletes who exercise vigorously. SYMPTOMS Symptoms of this condition include:  A feeling that your heart is beating rapidly or irregularly.  A feeling of discomfort or pain in your chest.  Shortness of breath.  Sudden light-headedness or weakness.  Getting tired easily during exercise. In some cases, there are no symptoms. DIAGNOSIS Your health care provider may be able to detect atrial fibrillation when taking your pulse. If detected, this condition may be diagnosed with:  An electrocardiogram (ECG).  A Holter monitor test that records your heartbeat patterns over a 24-hour period.  Transthoracic echocardiogram (TTE) to evaluate how blood flows through your heart.  Transesophageal echocardiogram (TEE) to view more detailed images of your heart.  A stress test.  Imaging tests, such as a CT scan or chest X-ray.  Blood tests. TREATMENT The main goals of  treatment are to prevent blood clots from forming and to keep your heart beating at a normal rate and rhythm. The type of treatment that you receive depends on many factors, such as your underlying medical conditions and how you feel when you are experiencing atrial fibrillation. This condition may be treated with:  Medicine to slow down the heart rate, bring the heart's rhythm back to normal, or prevent clots from forming.  Electrical cardioversion. This is a procedure that resets your heart's rhythm by delivering a controlled, low-energy shock to the heart through your skin.  Different types of ablation, such as catheter ablation, catheter ablation with pacemaker, or surgical ablation. These procedures destroy the heart tissues that send abnormal signals. When the pacemaker is used, it is placed under your skin to help your heart beat in a regular rhythm. HOME CARE INSTRUCTIONS  Take over-the counter and prescription medicines only as told by your health care provider.  If your health care provider prescribed a blood-thinning medicine (anticoagulant), take it exactly as  told. Taking too much blood-thinning medicine can cause bleeding. If you do not take enough blood-thinning medicine, you will not have the protection that you need against stroke and other problems.  Do not use tobacco products, including cigarettes, chewing tobacco, and e-cigarettes. If you need help quitting, ask your health care provider.  If you have obstructive sleep apnea, manage your condition as told by your health care provider.  Do not drink alcohol.  Do not drink beverages that contain caffeine, such as coffee, soda, and tea.  Maintain a healthy weight. Do not use diet pills unless your health care provider approves. Diet pills may make heart problems worse.  Follow diet instructions as told by your health care provider.  Exercise regularly as told by your health care provider.  Keep all follow-up visits as  told by your health care provider. This is important. PREVENTION  Avoid drinking beverages that contain caffeine or alcohol.  Avoid certain medicines, especially medicines that are used for breathing problems.  Avoid certain herbs and herbal medicines, such as those that contain ephedra or ginseng.  Do not use illegal drugs, such as cocaine and amphetamines.  Do not smoke.  Manage your high blood pressure. SEEK MEDICAL CARE IF:  You notice a change in the rate, rhythm, or strength of your heartbeat.  You are taking an anticoagulant and you notice increased bruising.  You tire more easily when you exercise or exert yourself. SEEK IMMEDIATE MEDICAL CARE IF:  You have chest pain, abdominal pain, sweating, or weakness.  You feel nauseous.  You notice blood in your vomit, bowel movement, or urine.  You have shortness of breath.  You suddenly have swollen feet and ankles.  You feel dizzy.  You have sudden weakness or numbness of the face, arm, or leg, especially on one side of the body.  You have trouble speaking, trouble understanding, or both (aphasia).  Your face or your eyelid droops on one side. These symptoms may represent a serious problem that is an emergency. Do not wait to see if the symptoms will go away. Get medical help right away. Call your local emergency services (911 in the [place] Do not drive yourself to the hospital.   This information is not intended to replace advice given to you by your health care provider. Make sure you discuss any questions you have with your health care provider.   Document Released: 07/09/2005 Document Revised: 03/30/2015 Document Reviewed: 11/03/2014 Elsevier Interactive Patient Education [employer].

## 2015-12-09 NOTE — Progress Notes (Signed)
Patient Profile: 75 y/o male admitted for acute CHF in the setting of atrial flutter w/ RVR. Found to have reduced EF down to 20-25%>> High risk stress test>> normal cath 12/08/15>> dilated NICM.  Subjective: No complaints this am. Groin is stable. Ambulating w/o difficulty. Ready to go home.  Objective: Vital signs in last 24 hours: Temp:  [97.5 F (36.4 C)-97.9 F (36.6 C)] 97.9 F (36.6 C) (05/19 0547) Pulse Rate:  [0-102] 56 (05/19 0547) Resp:  [0-51] 18 (05/19 0547) BP: (92-133)/(51-93) 107/91 mmHg (05/19 0547) SpO2:  [0 %-98 %] 98 % (05/19 0547) Weight:  [150 lb 9.2 oz (68.3 kg)-150 lb 12.7 oz (68.4 kg)] 150 lb 9.2 oz (68.3 kg) (05/19 0551) Last BM Date: 12/07/15  Intake/Output from previous day: 05/18 0701 - 05/19 0700 In: 1622 [P.O.:1622] Out: 1550 [Urine:1550] Intake/Output this shift:    Medications Current Facility-Administered Medications  Medication Dose Route Frequency Provider Last Rate Last Dose  . 0.9 %  sodium chloride infusion  250 mL Intravenous PRN Willia Craze, NP      . 0.9 %  sodium chloride infusion  250 mL Intravenous PRN Lorretta Harp, MD      . acetaminophen (TYLENOL) tablet 650 mg  650 mg Oral Q4H PRN Lorretta Harp, MD   650 mg at 12/08/15 2135  . carvedilol (COREG) tablet 3.125 mg  3.125 mg Oral BID WC Burnell Blanks, MD   3.125 mg at 12/09/15 [id number]  . feeding supplement (ENSURE ENLIVE) (ENSURE ENLIVE) liquid 237 mL  237 mL Oral BID BM Kinnie Feil, MD   237 mL at 12/07/15 1000  . folic acid (FOLVITE) tablet 1 mg  1 mg Oral Daily Willia Craze, NP   1 mg at 12/08/15 0925  . furosemide (LASIX) injection 20 mg  20 mg Intravenous BID Waldemar Dickens, MD   20 mg at 12/09/15 [id number]  . gabapentin (NEURONTIN) capsule 200 mg  200 mg Oral TID Willia Craze, NP   200 mg at 12/08/15 2135  . levothyroxine (SYNTHROID, LEVOTHROID) tablet 50 mcg  50 mcg Oral QAC breakfast Willia Craze, NP   50 mcg at 12/09/15 0612  . lisinopril  (PRINIVIL,ZESTRIL) tablet 2.5 mg  2.5 mg Oral Daily Kinnie Feil, MD   2.5 mg at 12/08/15 0926  . methocarbamol (ROBAXIN) tablet 500 mg  500 mg Oral BID Willia Craze, NP   500 mg at 12/08/15 2135  . morphine 2 MG/ML injection 2 mg  2 mg Intravenous Q1H PRN Lorretta Harp, MD      . multivitamin with minerals tablet 1 tablet  1 tablet Oral Daily Willia Craze, NP   1 tablet at 12/07/15 1127  . ondansetron (ZOFRAN) injection 4 mg  4 mg Intravenous Q6H PRN Lorretta Harp, MD      . potassium chloride SA (K-DUR,KLOR-CON) CR tablet 20 mEq  20 mEq Oral Daily Willia Craze, NP   20 mEq at 12/07/15 1000  . sodium chloride flush (NS) 0.9 % injection 3 mL  3 mL Intravenous Q12H Willia Craze, NP   3 mL at 12/08/15 2136  . sodium chloride flush (NS) 0.9 % injection 3 mL  3 mL Intravenous PRN Willia Craze, NP      . sodium chloride flush (NS) 0.9 % injection 3 mL  3 mL Intravenous Q12H Lorretta Harp, MD   0 mL at 12/08/15 2035  . sodium chloride flush (  NS) 0.9 % injection 3 mL  3 mL Intravenous PRN Lorretta Harp, MD      . thiamine (VITAMIN B-1) tablet 100 mg  100 mg Oral Daily Willia Craze, NP   100 mg at 12/08/15 0926   Filed Weights   12/08/15 0512 12/08/15 0919 12/09/15 0551  Weight: 150 lb 12.7 oz (68.4 kg) 150 lb 12.7 oz (68.4 kg) 150 lb 9.2 oz (68.3 kg)   PE: General appearance: alert, cooperative, no distress and elderly, frail appearing Neck: no carotid bruit and no JVD Lungs: clear to auscultation bilaterally Heart: irregularly irregular rhythm and tachy rate Extremities: no edema Pulses: 2+ and symmetric Skin: warm and dry Neurologic: Grossly normal  Lab Results:   Recent Labs  12/07/15 0324 12/09/15 0352  WBC 5.2 5.3  HGB 14.0 13.9  HCT 41.9 42.2  PLT 111* 123*   BMET  Recent Labs  12/07/15 0324 12/08/15 0331 12/09/15 0352  NA 141 138 137  K 3.9 4.5 4.1  CL 104 100* 102  CO2 26 30 26   GLUCOSE 94 96 94  BUN 13 13 11   CREATININE 0.97  1.08 1.09  CALCIUM 8.3* 8.3* 8.0*    Studies/Results:  LHC 12/08/15 IMPRESSION:Mr. Lovena Le has a left dominant system, clean coronary arteries and a dilated severely hypocontractile LV consistent with nonischemic cardiomyopathy.  Left Heart    Left Ventricle The left ventricle is enlarged. There is severe left ventricular systolic dysfunction. The left ventricular ejection fraction is less than 25% by visual estimate. There are wall motion abnormalities in the left ventricle. Left ventricle was moderately dilated and severely hypocontractile with an EF in the 15-20% range   Assessment/Plan  Active Problems:   SOB (shortness of breath)   Atrial flutter (HCC)   Edema   Hypothyroidism   ETOH abuse   Thrombocytopenia (HCC)   Weight loss   Lumbar disc disease   Protein calorie malnutrition (HCC)   Heart failure (HCC)   Elevated brain natriuretic peptide (BNP) level   Paroxysmal atrial fibrillation (HCC)   Acute diastolic congestive heart failure (HCC)   Acute systolic CHF (congestive heart failure) (HCC)   Cardiomyopathy (HCC)   Malnutrition of moderate degree   1. NICM:  EF 20-25% by echo, 15-20% by cath. [place] 5/18 demonstrated a left dominant system, clean coronary arteries and a dilated severely hypocontractile LV consistent with nonischemic cardiomyopathy. Likely tachy induced from rapid Afib/flutter vs ETOH abuse. Manage medically with BB and ACE-I. Continue Coreg and lisinopril.  Titrate meds and consider addition of hydralazine + nitrate and spironolactone if BP allows. This can be adjusted in clinic. Repeat 2D echo in 3 months.   2. Acute Systolic CHF: Subsequent to problem #1.  In regards to volume status, he is diuresing well. -1.5L out yesterday. Net negative 2.8L total. Weight is down 10 lb since admit from 160>>150 lb.  Renal function is normal. Consider switching from IV to PO Lasix.  Continue ACE-I. He is tolerating addition of BB therapy with Coreg for LVF and rate  control of atrial arrhthymia. Low sodium diet.   2. Atrial Fib/Flutter: TSH is WNL. K is stable at 4.1. Mg has also been stable this admit. Rate is still in the low 100s but improved from the 120s earlier with the addition of Coreg. Continue to adjust HR meds. Avoid Cardizem given LV dysfunction. BP is a bit soft and may not tolerate higher doses of Coreg. Can consider addition of digoxin for additional rate control +  systolic HF. Despite CHA2DS2 VASc score, he seems to be a very poor candidate for anti-coagulation given his prior GI bleeding and ongoing alcohol abuse, thus risk> benefit. Given he is not a candidate for a/c, would favor rate control strategy. No antiarrythmics as we will need to avoid chemical conversion.    LOS: 4 days    Brittainy M. Ladoris Gene 12/09/2015 8:49 AM  I have personally seen and examined this patient with Lyda Jester, PA-C. I agree with the assessment and plan as outlined above. He is doing well this am. HR is controlled on beta blocker. Still in atrial fib with no plans for DCCV. He promises me he will stop drinking etoh and will call with any evidence of bleeding. Will start Eliquis 5 mg po BID. Cath with normal coronary arteries. This is a non-ischemic cardiomyopathy. Continue Ace-inh. Change Lasix to 40 mg po daily. Discharge home today. Follow up with office APP in [address] office 1 week.   Lauree Chandler 12/09/2015 9:57 AM

## 2015-12-09 NOTE — Care Management Note (Signed)
Case Management Note Marvetta Gibbons RN, BSN Unit 2W-Case Manager [phone]  Patient Details  Name: Lucas Fuller MRN: [id number] Date of Birth: 1941/03/11  Subjective/Objective:  Pt admitted with SOB/AFIB                   Action/Plan: PTA pt lived at home- independent- plan to return home- referral for Eliquis received- spoke with pt at bedside- 30 day free card given to pt to use on discharge- will attempt to get insurance coverage info prior to pt leaving (pt instructed to speak with pharmacy regarding copay)- insurance check completed -however pt discharge before completed-  Per rep at [facility]:   Eliquis: $45 for 30 day retail/ Josem Kaufmann is required [phone]  Patient can use any retail pharmacy     Expected Discharge Date:    12/09/15           Expected Discharge Plan:  Home/Self Care  In-House Referral:     Discharge planning Services  CM Consult, Medication Assistance  Post Acute Care Choice:    Choice offered to:     DME Arranged:    DME Agency:     HH Arranged:    Ag[place] Agency:     Status of Service:  Completed, signed off  Medicare Important Message Given:  Yes Date Medicare IM Given:    Medicare IM give by:    Date Additional Medicare IM Given:    Additional Medicare Important Message give by:     If discussed at [place] of Stay Meetings, dates discussed:    Additional Comments:  Dawayne Patricia, RN 12/09/2015, 3:47 PM

## 2015-12-09 NOTE — Progress Notes (Signed)
Order received to discharge.  Telemetry removed and CCMD notified.  IV removed with catheter intact.  Extensive education given to Pt including handout with drawings to assist Pt with understanding.  Covered all education points provided in AVS.  Pt indicates understanding, but would benefit from follow up education.  Pt denies chest pain or sob at discharge.  Pt stable.

## 2015-12-12 ENCOUNTER — Other Ambulatory Visit: Payer: Self-pay | Admitting: *Deleted

## 2015-12-12 ENCOUNTER — Encounter: Payer: Self-pay | Admitting: *Deleted

## 2015-12-12 NOTE — Patient Outreach (Signed)
Referral received from hospital liaison to initiate transition of care program once member discharged from hospital.  Recently admitted for shortness of breath related to congestive heart failure and atrial flutter.  Discharged Friday, 5/19.  According to chart, also has history of hypothyroidism and cardiomyopathy.  Call placed to member, male answered phone, but line was immediately disconnected.  2nd call made, phone was answered, unable to hear other person.  3rd call made, busy signal noted.  Will make another attempt to reach member and start transition of care within the next 2 days.  Valente David, BSN, [facility] [employer]  [facility] Care Manager [phone]

## 2015-12-13 NOTE — Progress Notes (Signed)
Cardiology Office Note    Date:  12/15/2015   ID:  Lucas Fuller, DOB 20-Jan-1941, MRN [id number]  PCP:  Merrilee Seashore, MD  Cardiologist:  Dr. Julianne Handler   Post hospital f/u  History of Present Illness:  Lucas Fuller is a 75 y.o. 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  He was recently admitted to [facility] for acute CHF in the setting of atrial flutter w/ RVR. Found to have reduced EF down to 20-25%. A Myoview stress test showed apical scar without ischemia. Subsequent cardiac catheterization on 12/08/15 showed no CAD. Cardiomyopathy felt to be due to tachycardia from rapid Afib/flutter vs ETOH abuse. Due to ongoing alcohol abuse he was felt to not be a candidate for long term AC despite CHADSVASC score. However, the patient was discharged on Eliquis 5mg  BID.   Today he presents to clinic for follow up. He has not been drinking at all and feeling okay. Every once in a while he gets a little fleeting chest pain. No shortness of breath. No lower extremity edema, orthopnea or PND. No dizziness or syncope. No blood in his stool or urine. He does have a small knot at his groin site with mild tenderness.    Past Medical History  Diagnosis Date  . Thyroid disease   . Bladder cancer [facility])     Past Surgical History  Procedure Laterality Date  . Bladder surgery    . Cardiac catheterization N/A 12/08/2015    Procedure: Left Heart Cath and Coronary Angiography;  Surgeon: Lorretta Harp, MD;  Location: [place] CV LAB;  Service: Cardiovascular;  Laterality: N/A;    Current Medications: Outpatient Prescriptions Prior to Visit  Medication Sig Dispense Refill  . apixaban (ELIQUIS) 5 MG TABS tablet Take 1 tablet (5 mg total) by mouth 2 (two) times daily. 60 tablet 3  . furosemide (LASIX) 40  MG tablet Take 1 tablet (40 mg total) by mouth daily. 30 tablet 2  . gabapentin (NEURONTIN) 100 MG capsule Take 200 mg by mouth 3 (three) times daily.    Marland Kitchen levothyroxine (SYNTHROID, LEVOTHROID) 50 MCG tablet Take 50 mcg by mouth daily before breakfast.    . lisinopril (PRINIVIL,ZESTRIL) 2.5 MG tablet Take 1 tablet (2.5 mg total) by mouth daily. 30 tablet 2  . methocarbamol (ROBAXIN) 500 MG tablet Take 1 tablet (500 mg total) by mouth 2 (two) times daily. 20 tablet 0  . Multiple Vitamin (MULTIVITAMIN WITH MINERALS) TABS tablet Take 1 tablet by mouth daily. 30 tablet 0  . potassium chloride SA (K-DUR,KLOR-CON) 20 MEQ tablet Take 1 tablet (20 mEq total) by mouth daily. 30 tablet 2  . thiamine 100 MG tablet Take 1 tablet (100 mg total) by mouth daily. 30 tablet 0  . carvedilol (COREG) 3.125 MG tablet Take 1 tablet (3.125 mg total) by mouth 2 (two) times daily with a meal. 60 tablet 3   No facility-administered medications prior to visit.     Allergies:   Asa   Social History   Social History  . Marital Status: Divorced    Spouse Name: N/A  . Number of Children: N/A  . Years of Education: N/A   Social History Main Topics  . Smoking status: Never Smoker   . Smokeless tobacco: None  . Alcohol Use: Yes  Comment: 2 or 3 shots a day  . Drug Use: No  . Sexual Activity: Not Asked   Other Topics Concern  . None   Social History Narrative     Family History:  The patient's *family history includes CVA in his sister; Diabetes in his sister; Heart attack (age of onset: 7) in his father; Hypertension in his sister; Kidney disease in his sister; Lung cancer in his sister.   ROS:   Please see the history of present illness.    ROS All other systems reviewed and are negative.   PHYSICAL EXAM:   VS:  BP 124/72 mmHg  Pulse 100  Ht 5\' 7"  (1.702 m)  Wt 150 lb (68.04 kg)  BMI 23.49 kg/m2   GEN: Well nourished, well developed, in no acute distress HEENT: normal Neck: no JVD, carotid  bruits, or masses Cardiac: Irregularly irregular; no murmurs, rubs, or gallops,no edema. Small red not at his groin site with no bruit Respiratory:  clear to auscultation bilaterally, normal work of breathing GI: soft, nontender, nondistended, + BS MS: no deformity or atrophy Skin: warm and dry, no rash Neuro:  Alert and Oriented x 3, Strength and sensation are intact Psych: euthymic mood, full affect  Wt Readings from Last 3 Encounters:  12/15/15 150 lb (68.04 kg)  12/09/15 150 lb 9.2 oz (68.3 kg)  09/22/15 157 lb (71.215 kg)      Studies/Labs Reviewed:   EKG:  EKG is ordered today.  The ekg ordered today demonstrates Atrial flutter with variable AV block prolonged QT QT/QTc 398/513. HR 100   Recent Labs: 12/05/2015: ALT 36; B Natriuretic Peptide 971.0*; Magnesium 1.9 12/06/2015: TSH 4.961* 12/09/2015: BUN 11; Creatinine, Ser 1.09; Hemoglobin 13.9; Platelets 123*; Potassium 4.1; Sodium 137     Additional studies/ records that were reviewed today include:  2D ECHO: 12/05/2015 LV EF: 20% - 25% Study Conclusions - Left ventricle: The cavity size was normal. There was mild  concentric hypertrophy. Systolic function was severely reduced.  The estimated ejection fraction was in the range of 20% to 25%.  Diffuse hypokinesis. The study was not technically sufficient to  allow evaluation of LV diastolic dysfunction due to atrial  fibrillation. - Aortic valve: Possibly bicuspid; moderately thickened, moderately  calcified leaflets. Valve mobility was restricted. There was mild  stenosis. There was trivial regurgitation. Mean gradient (S): 5  mm Hg. Peak gradient (S): 9 mm Hg. Valve area (VTI): 0.78 cm^2.  Valve area (Vmax): 1.09 cm^2. Valve area (Vmean): 1.09 cm^2. - Aortic root: The aortic root was normal in size. - Mitral valve: Structurally normal valve. There was mild  regurgitation. - Left atrium: The atrium was moderately dilated. - Right ventricle: The cavity size  was moderately decreased. Wall  thickness was normal. Systolic function was normal. - Right atrium: The atrium was moderately dilated. - Tricuspid valve: There was mild regurgitation. - Pulmonic valve: There was no regurgitation. - Pulmonary arteries: Systolic pressure was mildly increased. PA  peak pressure: 37 mm Hg (S). - Inferior vena cava: The vessel was normal in size. - Pericardium, extracardiac: There was no pericardial effusion.  12/08/15 Procedures    Left Heart Cath and Coronary Angiography    Conclusion     There is severe left ventricular systolic dysfunction.  IMPRESSION:Mr. Lovena Le has a left dominant system, clean coronary arteries and a dilated severely hypocontractile LV consistent with nonischemic cardiomyopathy. His groin was sealed with a "MYNX " device. He will continue medical therapy. He left the  lab in stable condition.       ASSESSMENT:    1. Atrial flutter, unspecified type ([place])   2. Nonischemic cardiomyopathy ([place])   3. Chronic combined systolic and diastolic CHF (congestive heart failure) (HCC)      PLAN:  In order of problems listed above:  1. NICM: EF 20-25% by echo, 15-20% by cath. [place] 5/18 demonstrated a left dominant system, clean coronary arteries and a dilated severely hypocontractile LV consistent with nonischemic cardiomyopathy. Likely tachy induced from rapid Afib/flutter vs ETOH abuse. Manage medically with BB and ACE-I. Continue Coreg and lisinopril.Repeat 2D echo in 3 months. If no improvement in LV systolic function he may need a referral to EP for an ICD her primary prevention.  2. Chronic Systolic CHF: Subsequent to problem #1.Discharge weight 150 lb. Weight stable at 150 lbs today.Continue lasix 40mg  daily. Continue ACE-I and BB. We went over salt and fluid restrictions as well as daily weights.  3. Atrial Fib/Flutter: CHA2DS2 VASc score of 6 (CHF, HTN, CVA, age). Per review of cardiology notes, it was felt that he was too  high risk for anticoagulation due to his ongoing alcohol abuse. However, the patient is discharged by internal medicine on Eliquis 5 mg twice a day. He has actually quit drinking and has had no issues with bleeding so I think this is appropriate. He continues to be in atrial flutter with variable AV block. Heart rate 100 today. I will increase his Coreg from 3.125 mg twice a day to 6.25 mg twice a day for better heart rate control.  4. Knot at groin site: No signs of infection or bruit. I encouraged him to use a hot compress and Tylenol as needed for pain.  Medication Adjustments/Labs and Tests Ordered: Current medicines are reviewed at length with the patient today.  Concerns regarding medicines are outlined above.  Medication changes, Labs and Tests ordered today are listed in the Patient Instructions below. Patient Instructions  Increase Coreg to 6.25 mg twice a day  Your physician recommends that you schedule a follow-up appointment in: 6 to 8 weeks with Dr.McAlhany      Signed, Angelena Form, PA-C  12/15/2015 11:27 AM    [address], [place], [place]  [address] Phone: [phone]; Fax: [phone]

## 2015-12-14 ENCOUNTER — Other Ambulatory Visit: Payer: Self-pay | Admitting: *Deleted

## 2015-12-14 NOTE — Patient Outreach (Signed)
2nd attempt made to contact member to start transition of care program.  He verifies identity, this care manager explains [facility] care management services and purpose of call.  He reports he is "doing ok, just sore."   He is able to verbalize reason for hospital admission.  He reports that his swelling has decreased, but he has been unable to weigh himself because he does not have a scale.  Member has [employer], this Transport planner will contact them to see if he qualifies for their telemonitoring program, in which he will be provided a scale.  Member reports that he is taking all medications as prescribed, however he is unable to review list, stating that he is unable to name them all.  This care manager inquired about looking at medication bottles as list is read to him, he states he is unable to do that due to the location of the medications in the home (he is unable to get to them at this time).    According to chart, he has a history of alcohol abuse.  He state that his last drink was prior to hospitalization.  Discussed the need to refrain from heavy drinking as it can interfere with medications.  He verbalizes understanding.  He is agreeable to transition of care calls and home visit.  Initial home visit scheduled for next week.  Will review medications at that time.  Valente David, BSN, [facility]  [facility] Care Manager [phone]

## 2015-12-15 ENCOUNTER — Ambulatory Visit (INDEPENDENT_AMBULATORY_CARE_PROVIDER_SITE_OTHER): Payer: Medicare Other | Admitting: Physician Assistant

## 2015-12-15 ENCOUNTER — Encounter: Payer: Self-pay | Admitting: Physician Assistant

## 2015-12-15 VITALS — BP 124/72 | HR 100 | Ht 67.0 in | Wt 150.0 lb

## 2015-12-15 DIAGNOSIS — I5042 Chronic combined systolic (congestive) and diastolic (congestive) heart failure: Secondary | ICD-10-CM

## 2015-12-15 DIAGNOSIS — I4892 Unspecified atrial flutter: Secondary | ICD-10-CM

## 2015-12-15 DIAGNOSIS — I429 Cardiomyopathy, unspecified: Secondary | ICD-10-CM

## 2015-12-15 DIAGNOSIS — I428 Other cardiomyopathies: Secondary | ICD-10-CM

## 2015-12-15 MED ORDER — CARVEDILOL 6.25 MG PO TABS: 6.2500 mg | ORAL_TABLET | Freq: Two times a day (BID) | ORAL | Status: DC

## 2015-12-15 NOTE — Patient Instructions (Signed)
Increase Coreg to 6.25 mg twice a day  Your physician recommends that you schedule a follow-up appointment in: 6 to 8 weeks with Dr.McAlhany

## 2015-12-20 ENCOUNTER — Encounter: Payer: Self-pay | Admitting: *Deleted

## 2015-12-20 ENCOUNTER — Other Ambulatory Visit: Payer: Self-pay | Admitting: *Deleted

## 2015-12-20 NOTE — Patient Outreach (Signed)
[facility]) Care Management   12/20/2015  PRIDE GONZALES 11/21/1940 [id number]  MANSON LUCKADOO is an 75 y.o. male  Subjective:   Objective:   Review of Systems  Constitutional: Negative.   HENT: Negative.   Eyes: Negative.   Respiratory: Negative.   Cardiovascular: Negative.   Gastrointestinal: Negative.   Genitourinary: Negative.   Musculoskeletal: Negative.   Skin: Negative.   Neurological: Negative.   Endo/Heme/Allergies: Negative.   Psychiatric/Behavioral: Negative.     Physical Exam  Constitutional: He is oriented to person, place, and time. He appears well-developed and well-nourished.  Neck: Normal range of motion.  Cardiovascular: Normal rate.   Atrial fibrillation  Respiratory: Effort normal and breath sounds normal.  GI: Soft. Bowel sounds are normal.  Musculoskeletal: Normal range of motion.  Neurological: He is alert and oriented to person, place, and time.  Skin: Skin is warm and dry.    BP 108/66 mmHg  Pulse 88  Resp 18  Ht 1.702 m ('5\' 7"'$ )  Wt 151 lb 12.8 oz (68.856 kg)  BMI 23.77 kg/m2  SpO2 97%   Encounter Medications:   Outpatient Encounter Prescriptions as of 12/20/2015  Medication Sig  . apixaban (ELIQUIS) 5 MG TABS tablet Take 1 tablet (5 mg total) by mouth 2 (two) times daily.  . carvedilol (COREG) 6.25 MG tablet Take 1 tablet (6.25 mg total) by mouth 2 (two) times daily.  . furosemide (LASIX) 40 MG tablet Take 1 tablet (40 mg total) by mouth daily.  Marland Kitchen gabapentin (NEURONTIN) 100 MG capsule Take 200 mg by mouth 3 (three) times daily.  Marland Kitchen levothyroxine (SYNTHROID, LEVOTHROID) 50 MCG tablet Take 50 mcg by mouth daily before breakfast.  . lisinopril (PRINIVIL,ZESTRIL) 2.5 MG tablet Take 1 tablet (2.5 mg total) by mouth daily.  . Multiple Vitamin (MULTIVITAMIN WITH MINERALS) TABS tablet Take 1 tablet by mouth daily.  . potassium chloride SA (K-DUR,KLOR-CON) 20 MEQ tablet Take 1 tablet (20 mEq total) by mouth daily.  Marland Kitchen thiamine  100 MG tablet Take 1 tablet (100 mg total) by mouth daily.  . methocarbamol (ROBAXIN) 500 MG tablet Take 1 tablet (500 mg total) by mouth 2 (two) times daily. (Patient not taking: Reported on 12/20/2015)   No facility-administered encounter medications on file as of 12/20/2015.    Functional Status:   In your present state of health, do you have any difficulty performing the following activities: 12/20/2015 12/05/2015  Hearing? N N  Vision? N N  Difficulty concentrating or making decisions? N N  Walking or climbing stairs? N N  Dressing or bathing? N N  Doing errands, shopping? N N  Preparing Food and eating ? N -  Using the Toilet? N -  In the past six months, have you accidently leaked urine? N -  Do you have problems with loss of bowel control? N -  Managing your Medications? N -  Managing your Finances? N -  Housekeeping or managing your Housekeeping? N -    Fall/Depression Screening:    PHQ 2/9 Scores 12/20/2015  PHQ - 2 Score 1   Fall Risk  12/20/2015  Falls in the past year? Yes  Number falls in past yr: 1  Injury with Fall? Yes  Risk Factor Category  High Fall Risk  Risk for fall due to : History of fall(s)  Follow up Education provided;Falls prevention discussed    Assessment:    Met with member as scheduled time for initial home visit.  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  He states that his heart failure diagnosis is new (this recent admission) and request more information on management.  He denies receiving heart failure package from the hospital prior to discharge (he looked during visit, unable to find).  Discussed heart failure management, including medication compliance, daily weights, and diet.  He has obtained a new scale, demonstration noted on correct usage.  Advised to record weights daily for trends.  Notified when to contact the physician according to heart failure zones.  Diet discussed, provided with nutrition facts sheet,  discussed importance of reading labels and specific foods that has a lower sodium content.  He verbalizes understanding.  Patient was recently discharged from hospital and all medications have been reviewed.  Member reports compliance with all medications.  Provided with new pill box.  New to Eliquis, discussed side effects, including risk of bleeding.  Verbalizes understanding of how to monitor for bleeding.  Member confirms follow up with cardiologist complete, however has not scheduled an appointment with his PCP.  Call placed to PCP office to schedule follow up, office closed.  Verified that member does have the office number, advised to contact for appointment.    Denies further concerns at this time with the exception of more education on heart failure.  Contact information for this care manager provided, encouraged to contact with questions.  Plan:   Will have care management assistant mail heart failure education. Will continue with weekly transition of care calls next week.   THN CM Care Plan Problem One        Most Recent Value   Care Plan Problem One  Recent hospitalization   Role Documenting the Problem One  Care Management Coordinator   Care Plan for Problem One  Active   THN Long Term Goal (31-90 days)  Member will not be readmitted to hospital within the next 31 days   THN Long Term Goal Start Date  12/14/15   Interventions for Problem One Long Term Goal  Discussed with member the importance of following discharge instructions, including follow up appointments, medications, diet, and possible home health involvement, to decrease the risk of readmission   THN CM Short Term Goal #1 (0-30 days)  Member will have follow up appointment within the next 2 weeks   THN CM Short Term Goal #1 Start Date  12/14/15   [facility] CM Short Term Goal #1 Met Date  12/20/15   Interventions for Short Term Goal #1  Appointment confirmed for tomorrow, transportation confirmed.  Discussed importance of  keeping/attending appointment   THN CM Short Term Goal #2 (0-30 days)  Member will take all medications as prescribed over the next 4 weeks   THN CM Short Term Goal #2 Start Date  12/14/15   Interventions for Short Term Goal #2  Discussed importance of medication adherence.  Advised to compare medication bottles to list on discharge paperwork   THN CM Short Term Goal #3 (0-30 days)  Member will have scale for daily weights, and record them, over the next 4 weeks   THN CM Short Term Goal #3 Start Date  12/14/15   Interventions for Short [place] Goal #3  Discussed importance of daily weights as it aids in the management of heart failure.  Will assist with obtaining scale     Valente David, BSN, [place] Manager [phone]

## 2015-12-21 ENCOUNTER — Encounter: Payer: Self-pay | Admitting: *Deleted

## 2015-12-27 ENCOUNTER — Ambulatory Visit: Payer: Self-pay | Admitting: *Deleted

## 2015-12-27 ENCOUNTER — Other Ambulatory Visit: Payer: Self-pay | Admitting: *Deleted

## 2015-12-27 NOTE — Patient Outreach (Signed)
Weekly transition of care call placed to member, no answer.  HIPPA compliant voice message left, will await call back.  If no call back, will follow up next week.  Gelena Klosinski, RN, MSN [facility]N Care Management  Community Care Manager [phone]  

## 2016-01-03 ENCOUNTER — Other Ambulatory Visit: Payer: Self-pay | Admitting: *Deleted

## 2016-01-03 NOTE — Patient Outreach (Signed)
Call received back from member.  He state he has been doing "ok."  He still reports some shortness of breath at times, but state he continues to improve overall.  He reports his weight today being 158 pounds, which is an increase of 7 pounds in the past 2 weeks, but he state he did not weigh himself at the same time, before eating breakfast.  Reminded of the importance of weighing in the morning, same time, approximately the same clothes, before eating and after voiding for the most accurate weights.  He verbalizes understanding.  He state he still has not called back to his PCP office to schedule follow up appointment.  He was seen by the cardiologist on 5/25 and has another appointment scheduled for next month.      Member denies any concerns at this time, will follow up next week.  Valente David, [place], MSN [place] [phone]

## 2016-01-03 NOTE — Patient Outreach (Signed)
Weekly transition of care call placed to member, no answer.  HIPPA compliant voice message left, will await call back.  If no call back, will follow up next week.  [place] Festa, RN, MSN [facility] Care Management  Community Care Manager [phone]  

## 2016-01-05 ENCOUNTER — Other Ambulatory Visit: Payer: Self-pay | Admitting: *Deleted

## 2016-01-05 NOTE — Patient Outreach (Signed)
Call received from [place], [employer], to discuss the member being involved with the telemonitoring program for heart failure.  Information provided about history and ability to perform tasks independently.  Member will be contacted to begin program.   Valente David, RN, MSN [place] Manager [phone]

## 2016-01-06 DIAGNOSIS — I4892 Unspecified atrial flutter: Secondary | ICD-10-CM | POA: Diagnosis not present

## 2016-01-06 DIAGNOSIS — I5022 Chronic systolic (congestive) heart failure: Secondary | ICD-10-CM | POA: Diagnosis not present

## 2016-01-06 DIAGNOSIS — E782 Mixed hyperlipidemia: Secondary | ICD-10-CM | POA: Diagnosis not present

## 2016-01-06 DIAGNOSIS — I428 Other cardiomyopathies: Secondary | ICD-10-CM | POA: Diagnosis not present

## 2016-01-09 ENCOUNTER — Telehealth: Payer: Self-pay | Admitting: *Deleted

## 2016-01-09 ENCOUNTER — Telehealth: Payer: Self-pay

## 2016-01-09 NOTE — Telephone Encounter (Signed)
Prior auth for Eliquis 5 mg obtained from [facility]. [id number]. Good through 07/22/2016.

## 2016-01-09 NOTE — Telephone Encounter (Signed)
Patient stated that the eliquis requires a PA [facility].

## 2016-01-09 NOTE — Telephone Encounter (Signed)
PA obtained for Eliquis 5mg . Will forward to [facility].

## 2016-01-10 ENCOUNTER — Other Ambulatory Visit: Payer: Self-pay | Admitting: *Deleted

## 2016-01-10 NOTE — Patient Outreach (Signed)
Weekly transition of care call placed to preferred number listed, no answer, HIPPA compliant voice message left.  Call then placed to alternate number, no answer, unable to leave a voice message as mailbox has not been set up.  Will await call back, if no call back, will follow up next week.  Valente David, [place], MSN [place] [phone]

## 2016-01-13 ENCOUNTER — Encounter: Payer: Self-pay | Admitting: Cardiovascular Disease

## 2016-01-18 ENCOUNTER — Telehealth: Payer: Self-pay | Admitting: Cardiovascular Disease

## 2016-01-18 NOTE — Telephone Encounter (Signed)
Left message to call back  

## 2016-01-18 NOTE — Telephone Encounter (Signed)
I spoke with pt and made appt for him to see Dr. Angelena Form on July 26,2017 at 3:45

## 2016-01-18 NOTE — Telephone Encounter (Signed)
New Message  PT called to reschedule appt. Pt was not satisfied with next avail appt. And asked to speak with RN before new apppt was scheduled. Please call back to discuss

## 2016-01-19 ENCOUNTER — Other Ambulatory Visit: Payer: Self-pay | Admitting: *Deleted

## 2016-01-19 ENCOUNTER — Ambulatory Visit: Payer: Self-pay | Admitting: *Deleted

## 2016-01-19 DIAGNOSIS — I5041 Acute combined systolic (congestive) and diastolic (congestive) heart failure: Secondary | ICD-10-CM

## 2016-01-19 NOTE — Patient Outreach (Signed)
Call placed to member in attempt to complete transition of care program and possibly refer to health coach for ongoing heart failure education.  No answer, HIPAA compliant voice message left.  Call immediately received back from member, he state he is doing well.  He continues to weigh himself daily, but admits that he does not do it the same time every day.  Advised of the best time of day to weigh (upon awakening, after voiding, before eating) to obtain most accurate weight.  He denies receiving a call or equipment from [employer] for their telemonitoring program as of yet.  Informed that he was approved for program.    He reports taking medications as prescribed, and state that he is trying to adhere to a low salt diet.  He is made aware that his goals for transition of care program has been met, but he agrees that he need ongoing education for heart failure management.  He confirms that he did receive heart failure packet with zones, but state he has not read it.  Advised that referral to health coach would be placed for disease management.  He denies any further needs for this care manager.  Referral sent to health coach.  Will close to community case management.  Lucas Fuller, [place], MSN [place] [phone]

## 2016-01-25 ENCOUNTER — Ambulatory Visit: Payer: Self-pay | Admitting: *Deleted

## 2016-02-10 ENCOUNTER — Other Ambulatory Visit: Payer: Self-pay | Admitting: *Deleted

## 2016-02-10 NOTE — Patient Outreach (Signed)
[place] [facility]) Care Management  02/10/2016  Lucas Fuller 08/21/1940 [id number]   RN Health Coach attempted #1 Follow up outreach call to patient.  Patient was unavailable. HIPPA compliance voicemail message was left with return callback number.  Plan: RN will call patient again within 14 days.    [place] Care Management [phone]

## 2016-02-13 ENCOUNTER — Ambulatory Visit: Payer: Medicare Other | Admitting: Cardiovascular Disease

## 2016-02-13 DIAGNOSIS — I10 Essential (primary) hypertension: Secondary | ICD-10-CM | POA: Diagnosis not present

## 2016-02-13 DIAGNOSIS — M5136 Other intervertebral disc degeneration, lumbar region: Secondary | ICD-10-CM | POA: Diagnosis not present

## 2016-02-15 ENCOUNTER — Encounter: Payer: Self-pay | Admitting: Cardiovascular Disease

## 2016-02-15 ENCOUNTER — Ambulatory Visit (INDEPENDENT_AMBULATORY_CARE_PROVIDER_SITE_OTHER): Payer: Medicare Other | Admitting: Cardiovascular Disease

## 2016-02-15 VITALS — BP 116/84 | HR 83 | Ht 67.0 in | Wt 168.0 lb

## 2016-02-15 DIAGNOSIS — I429 Cardiomyopathy, unspecified: Secondary | ICD-10-CM | POA: Diagnosis not present

## 2016-02-15 DIAGNOSIS — I5023 Acute on chronic systolic (congestive) heart failure: Secondary | ICD-10-CM

## 2016-02-15 DIAGNOSIS — I428 Other cardiomyopathies: Secondary | ICD-10-CM

## 2016-02-15 DIAGNOSIS — I481 Persistent atrial fibrillation: Secondary | ICD-10-CM

## 2016-02-15 DIAGNOSIS — I4819 Other persistent atrial fibrillation: Secondary | ICD-10-CM

## 2016-02-15 MED ORDER — FUROSEMIDE 40 MG PO TABS: ORAL_TABLET | ORAL | 2 refills | Status: DC

## 2016-02-15 NOTE — Progress Notes (Signed)
Chief Complaint  Patient presents with  . Follow-up    History of Present Illness: 75 yo male with history of paroxysmal atrial fibrillation/flutter, systolic CHF, non-ischemic cardiomyopathy, etoh abuse, GI bleeding, CVA, bladder cancer who is here today for cardiac follow up. He was admitted to [facility] 12/09/15 with acute CHF in the setting of new onset atrial fibrillation with RVR. LVEF was found to be 20-25%. Stress myoview with apical scar. Cardiac catheterization on 12/08/15 without evidence of CAD. His non-ischemic cardiomyopathy was felt to be tachycardia related vs etoh related. He was discharged home on Eliquis 5 mg po BID, beta blocker and Ace-inh. He was seen here in our office 12/15/15 by Nell Range, PA-C and was doing well.   He is here today for follow up. He denies chest pain or shortness of breath. No lower extremity edema, orthopnea or PND. No dizziness or syncope. No blood in his stool or urine.    Primary Care Physician: Merrilee Seashore, MD   Past Medical History:  Diagnosis Date  . Bladder cancer ([place])   . Thyroid disease     Past Surgical History:  Procedure Laterality Date  . BLADDER SURGERY    . CARDIAC CATHETERIZATION N/A 12/08/2015   Procedure: Left Heart Cath and Coronary Angiography;  Surgeon: Lorretta Harp, MD;  Location: [facility];  Service: Cardiovascular;  Laterality: N/A;    Current Outpatient Prescriptions  Medication Sig Dispense Refill  . apixaban (ELIQUIS) 5 MG TABS tablet Take 1 tablet (5 mg total) by mouth 2 (two) times daily. 60 tablet 3  . carvedilol (COREG) 6.25 MG tablet Take 1 tablet (6.25 mg total) by mouth 2 (two) times daily. 60 tablet 6  . furosemide (LASIX) 40 MG tablet Take 1 tablet (40 mg total) by mouth daily. 30 tablet 2  . gabapentin (NEURONTIN) 100 MG capsule Take 200 mg by mouth 3 (three) times daily.    Marland Kitchen levothyroxine (SYNTHROID, LEVOTHROID) 50 MCG tablet Take 50 mcg by mouth daily before breakfast.    .  lisinopril (PRINIVIL,ZESTRIL) 2.5 MG tablet Take 1 tablet (2.5 mg total) by mouth daily. 30 tablet 2  . Multiple Vitamin (MULTIVITAMIN WITH MINERALS) TABS tablet Take 1 tablet by mouth daily. 30 tablet 0  . potassium chloride SA (K-DUR,KLOR-CON) 20 MEQ tablet Take 1 tablet (20 mEq total) by mouth daily. 30 tablet 2   No current facility-administered medications for this visit.     Allergies  Allergen Reactions  . Asa [Aspirin] Hives    Social History   Social History  . Marital status: Divorced    Spouse name: N/A  . Number of children: N/A  . Years of education: N/A   Occupational History  . Not on file.   Social History Main Topics  . Smoking status: Never Smoker  . Smokeless tobacco: Not on file  . Alcohol use Yes     Comment: 2 or 3 shots a day  . Drug use: No  . Sexual activity: Not on file   Other Topics Concern  . Not on file   Social History Narrative  . No narrative on file    Family History  Problem Relation Age of Onset  . Kidney disease Sister   . Heart attack Father 31  . Lung cancer Sister   . CVA Sister   . Diabetes Sister   . Hypertension Sister     Review of Systems:  As stated in the HPI and otherwise negative.   BP 116/84 (  BP Location: Left Arm, Patient Position: Sitting, Cuff Size: Normal)   Pulse 83   Ht 5\' 7"  (1.702 m)   Wt 168 lb (76.2 kg)   SpO2 98%   BMI 26.31 kg/m   Physical Examination: General: Well developed, well nourished, NAD  HEENT: OP clear, mucus membranes moist  SKIN: warm, dry. No rashes. Neuro: No focal deficits  Musculoskeletal: Muscle strength 5/5 all ext  Psychiatric: Mood and affect normal  Neck: No JVD, no carotid bruits, no thyromegaly, no lymphadenopathy.  Lungs:Clear bilaterally, no wheezes, rhonci, crackles Cardiovascular: Regular rate and rhythm. No murmurs, gallops or rubs. Abdomen:Soft. Bowel sounds present. Non-tender.  Extremities: No lower extremity edema. Pulses are 2 + in the bilateral  DP/PT.  EKG:  EKG is not ordered today. The ekg ordered today demonstrates   Recent Labs: 12/05/2015: ALT 36; B Natriuretic Peptide 971.0; Magnesium 1.9 12/06/2015: TSH 4.961 12/09/2015: BUN 11; Creatinine, Ser 1.09; Hemoglobin 13.9; Platelets 123; Potassium 4.1; Sodium 137   Lipid Panel   Wt Readings from Last 3 Encounters:  02/15/16 168 lb (76.2 kg)  12/20/15 151 lb 12.8 oz (68.9 kg)  12/15/15 150 lb (68 kg)     Other studies Reviewed: Additional studies/ records that were reviewed today include: . Review of the above records demonstrates:   Assessment and Plan:   1. NICM: EF 20-25% by echo. LHC 12/08/15 demonstrated a left dominant system, clean coronary arteries and a dilated severely hypocontractile LV consistent with nonischemic cardiomyopathy. Likely tachy induced from rapid Afib/flutter vs ETOH abuse. Will continue Coreg and lisinopril.Repeat 2D echo in September 2017. If no improvement in LV systolic function he may need a referral to EP for an ICD for primary prevention.  2. Acute on Chronic Systolic CHF: His weight is up today by 17 lbs over last 2 months. Discharge weight 150 lbs. He has lower extremity edema. He thinks some of his weight gain is due to recent increase in caloric intake. He had unexplained 40 lb weight lost 6 months ago. Will increase Lasix to 40 mg po BID for one week. Continue ACE-I and BB. We went over salt and fluid restrictions as well as daily weights. He will let us know if his weight does not decrease with extra diuresis.   3. Atrial Fib/Flutter: rate is controlled on Coreg. CHA2DS2 VASc score of 6 (CHF, HTN, CVA, age). Continue Eliquis, Coreg. No plans for cardioversion right now.   Current medicines are reviewed at length with the patient today.  The patient does not have concerns regarding medicines.  The following changes have been made:  no change  Labs/ tests ordered today include:  No orders of the defined types were placed in this  encounter.    Disposition:   FU with me in 8 weeks   Signed, Lauree Chandler, MD 02/15/2016 3:45 PM    [facility] [place], [place], [place]  [address] Phone: [phone]; Fax: [phone]

## 2016-02-15 NOTE — Patient Instructions (Addendum)
Medication Instructions:  Your physician has recommended you make the following change in your medication: Increase furosemide to 40 mg by mouth twice daily for one week and then decrease to 40 mg by mouth daily.     Labwork: none  Testing/Procedures: none  Follow-Up: Your physician recommends that you schedule a follow-up appointment in: 6-8 weeks. Scheduled for September 20,2017 at 11:15    Any Other Special Instructions Will Be Listed Below (If Applicable).     If you need a refill on your cardiac medications before your next appointment, please call your pharmacy.

## 2016-02-20 DIAGNOSIS — E782 Mixed hyperlipidemia: Secondary | ICD-10-CM | POA: Diagnosis not present

## 2016-02-20 DIAGNOSIS — I10 Essential (primary) hypertension: Secondary | ICD-10-CM | POA: Diagnosis not present

## 2016-02-20 DIAGNOSIS — E039 Hypothyroidism, unspecified: Secondary | ICD-10-CM | POA: Diagnosis not present

## 2016-02-20 DIAGNOSIS — D696 Thrombocytopenia, unspecified: Secondary | ICD-10-CM | POA: Diagnosis not present

## 2016-02-24 ENCOUNTER — Other Ambulatory Visit: Payer: Self-pay | Admitting: Cardiovascular Disease

## 2016-02-24 ENCOUNTER — Other Ambulatory Visit: Payer: Self-pay | Admitting: *Deleted

## 2016-02-24 ENCOUNTER — Encounter: Payer: Self-pay | Admitting: *Deleted

## 2016-02-24 NOTE — Patient Outreach (Signed)
[place] [facility]) Care Management  02/24/2016  Lucas Fuller 07-30-1940 [id number]  RN [place] attempted#2  Follow up outreach call to patient.  Patient was unavailable. HIPPA compliance voicemail message was left with return callback number.  Plan: RN will call patient again within 14 days.    [place] Care Management [phone]

## 2016-02-24 NOTE — Patient Outreach (Signed)
[facility] [facility]) Care Management  [id number]  Lucas Fuller [birth year] [id number]  Subjective: RN Health Coach  Received return telephone call from  patient.  Hipaa compliance verified. Per patient he went to the Dr and has been placed on lasix 40 mg twice a week for swelling in legs. Per patient he weighed today 165 pounds. Patient stated that he doesn't get to weigh each morning because he has to be at work at 5 am and he forgets to do it. Patient stated that he is not aware of the zones. RN discussed with patient the zones and explained why he needs to weigh and what it means to the Dr.  Shelly Bombard explained that the zones have action plans to help the patient. Per patient he is eating low sodium diet . He has a very good appetite. Per patient he is not exercising. Patient has agreed to follow up out reach calls.  Objective:   Current Medications:  Current Outpatient Prescriptions  Medication Sig Dispense Refill  . apixaban (ELIQUIS) 5 MG TABS tablet Take 1 tablet (5 mg total) by mouth 2 (two) times daily. 60 tablet 3  . carvedilol (COREG) 6.25 MG tablet Take 1 tablet (6.25 mg total) by mouth 2 (two) times daily. 60 tablet 6  . furosemide (LASIX) 40 MG tablet Take one tablet by mouth twice daily for one week and then one tablet daily 40 tablet 2  . gabapentin (NEURONTIN) 100 MG capsule Take 200 mg by mouth 3 (three) times daily.    Marland Kitchen levothyroxine (SYNTHROID, LEVOTHROID) 50 MCG tablet Take 50 mcg by mouth daily before breakfast.    . Multiple Vitamin (MULTIVITAMIN WITH MINERALS) TABS tablet Take 1 tablet by mouth daily. 30 tablet 0  . KLOR-CON M20 20 MEQ tablet TAKE 1 TABLET BY MOUTH EVERY DAY 30 tablet 2  . lisinopril (PRINIVIL,ZESTRIL) 2.5 MG tablet TAKE 1 TABLET BY MOUTH EVERY DAY 30 tablet 2   No current facility-administered medications for this visit.     Functional Status:  In your present state of health, do you have any difficulty performing the following activities:  02/24/2016 12/20/2015  Hearing? N N  Vision? N N  Difficulty concentrating or making decisions? N N  Walking or climbing stairs? N N  Dressing or bathing? N N  Doing errands, shopping? N N  Preparing Food and eating ? N N  Using the Toilet? N N  In the past six months, have you accidently leaked urine? N N  Do you have problems with loss of bowel control? N N  Managing your Medications? N N  Managing your Finances? N N  Housekeeping or managing your Housekeeping? N N  Some recent data might be hidden    Fall/Depression Screening: PHQ 2/9 Scores 02/24/2016 12/20/2015  PHQ - 2 Score 1 1   THN CM Care Plan Problem One   Flowsheet Row Most Recent Value  Care Plan Problem One  Knowledge deficit in self management of Congestive Heart Failure  Role Documenting the Problem One  [place] for Problem One  Active  THN Long Term Goal (31-90 days)  Patient will not have any readmissions for congestive heart failure within the next 90 days  THN Long Term Goal Start Date  02/24/16  Interventions for Problem One [place] reminded the patient the importance of keeping appointments with primary care physician and Cardiologist. RN reminded Patient the importance of taking medications as per physician  order  THN CM Short Term Goal #1 (0-30 days)  Patient will be able to verbalize 3 signs and symptoms of congestive heart failure within 30 days  THN CM Short Term Goal #1 Start Date  02/24/16  Interventions for Short Term Goal #1  RN Health Coach will send educational material on congestive heart failure zones. RN discussed with patient what the zones and action plan mean. RN will follow up with further discussion and teach back  THN CM Short Term Goal #2 (0-30 days)  Patient will report weighing daily and recording weight within the next 30 days  THN CM Short Term Goal #2 Start Date  02/24/16  Interventions for Short Term Goal #2  [place] discussed with patient the  importance of weighing and recording. RN discussed any weight gain of 3 or more pounds in a day or 5 pounds in a week patient will notify doctor.       Assessment: Patient is not weighing daily Patient is not aware of CHF zones and action plan Patient will benefit from [place] telephonic outreach for education and support for Congestive Heart Failure self management.  Plan:  RN Health Coach sent patient educational material on Weighing daily RN Health Coach sent patient educational material  Congestive Heart Failure Zones and Action Plan RN sent Living Better with Heart Failure Booklet RN will follow up within the month of September for discussion and teach back  [place] Management [phone]

## 2016-03-02 ENCOUNTER — Encounter: Payer: Self-pay | Admitting: *Deleted

## 2016-03-07 ENCOUNTER — Ambulatory Visit: Payer: Self-pay | Admitting: *Deleted

## 2016-03-27 ENCOUNTER — Other Ambulatory Visit: Payer: Self-pay | Admitting: *Deleted

## 2016-03-27 NOTE — Patient Outreach (Signed)
[place] [facility]) Care Management  03/27/2016  Lucas Fuller [birth year] [id number]   [place] attempted #1  Follow up outreach call to patient.  Patient was unavailable. HIPPA compliance voicemail message was left with return callback number.   Plan: RN will call patient again within 14 days.  [place] Care Management [phone]

## 2016-03-30 ENCOUNTER — Other Ambulatory Visit: Payer: Self-pay | Admitting: *Deleted

## 2016-04-05 ENCOUNTER — Encounter: Payer: Self-pay | Admitting: *Deleted

## 2016-04-05 NOTE — Patient Outreach (Signed)
[facility]) Care Management  04/05/2016 Late entry  Lucas Fuller 1941/06/13 [id number]  Subjective: [place] telephone received return call from patient.  [place] compliance verified. Per patient he goes to work early am for a few hours. Per patient he does get short of breath with a lot of walking. Patient last weight was 163 1/2 pounds. Patient states he forgets to weigh sometimes.  Patient is not having any swelling in his lower extremities. Per patient he is trying to eat low sodium foods and needs more education.  Patient has agreed to follow up outreach calls.   Objective:   Current Medications:  Current Outpatient Prescriptions  Medication Sig Dispense Refill  . apixaban (ELIQUIS) 5 MG TABS tablet Take 1 tablet (5 mg total) by mouth 2 (two) times daily. 60 tablet 3  . carvedilol (COREG) 6.25 MG tablet Take 1 tablet (6.25 mg total) by mouth 2 (two) times daily. 60 tablet 6  . furosemide (LASIX) 40 MG tablet Take one tablet by mouth twice daily for one week and then one tablet daily 40 tablet 2  . gabapentin (NEURONTIN) 100 MG capsule Take 200 mg by mouth 3 (three) times daily.    Marland Kitchen KLOR-CON M20 20 MEQ tablet TAKE 1 TABLET BY MOUTH EVERY DAY 30 tablet 2  . levothyroxine (SYNTHROID, LEVOTHROID) 50 MCG tablet Take 50 mcg by mouth daily before breakfast.    . lisinopril (PRINIVIL,ZESTRIL) 2.5 MG tablet TAKE 1 TABLET BY MOUTH EVERY DAY 30 tablet 2  . Multiple Vitamin (MULTIVITAMIN WITH MINERALS) TABS tablet Take 1 tablet by mouth daily. 30 tablet 0   No current facility-administered medications for this visit.     Functional Status:  In your present state of health, do you have any difficulty performing the following activities: 03/30/2016 02/24/2016  Hearing? N N  Vision? N N  Difficulty concentrating or making decisions? N N  Walking or climbing stairs? N N  Dressing or bathing? N N  Doing errands, shopping? N N  Preparing Food and eating ? - N  Using the  Toilet? - N  In the past six months, have you accidently leaked urine? - N  Do you have problems with loss of bowel control? - N  Managing your Medications? - N  Managing your Finances? - N  Housekeeping or managing your Housekeeping? - N  Some recent data might be hidden    Fall/Depression Screening: PHQ 2/9 Scores 03/30/2016 02/24/2016 12/20/2015  PHQ - 2 Score '1 1 1   '$ THN CM Care Plan Problem One   Flowsheet Row Most Recent Value  Care Plan Problem One  Knowledge deficit in self management of Congestive Heart Failure  Role Documenting the Problem One   [place] for Problem One  Active  THN Long Term Goal (31-90 days)  Patient will not have any readmissions for congestive heart failure within the next 90 days  Interventions for Problem One [place] reminded the patient the importance of keeping appointments with primary care physician and Cardiologist. RN reminded Patient the importance of taking medications as per physician order  [facility] CM Short Term Goal #1 (0-30 days)  Patient will be able to verbalize 3 signs and symptoms of congestive heart failure within 30 days  THN CM Short Term Goal #1 Met Date  03/30/16  Interventions for Short Term Goal #1  [place] will send educational material on congestive heart failure zones. RN discussed  with patient what the zones and action plan mean. RN will follow up with further discussion and teach back  THN CM Short Term Goal #2 (0-30 days)  Patient will report weighing daily and recording weight within the next 30 days  Interventions for Short Term Goal #2  [place] discussed with patient the importance of weighing and recording. RN discussed any weight gain of 3 or more pounds in a day or 5 pounds in a week patient will notify doctor.   THN CM Short Term Goal #3 (0-30 days)  Patient will be able to verblize foods low in sodium within the next 30 days  THN CM Short Term Goal #3 Start Date  03/30/16   Interventions for Short Tern Goal #3  RN sent patient a picture chart of foods high and low in sodium. RN sent educational material of food high and low in sodiun. RN sent patient Dash samples. RN will follow up with discussion      Assessment:  Patient needs more education on low sodium foods Patient forgets to weigh sometimes Patient will continue to benefit from [facility] [employer] telephonic outreach for education and support for congestive heart failure self management.  Plan:  RN will send patient educational material on low sodium eating plan RN sent patient educational material on Dash Diet RN sent patient educational material on Cooking with less salt RN sent patient picture chart on foods high and low in sodium RN will follow up outreach in the month of October  [place] [facility] [phone]

## 2016-04-10 ENCOUNTER — Ambulatory Visit: Payer: Self-pay | Admitting: *Deleted

## 2016-04-10 NOTE — Progress Notes (Signed)
Chief Complaint  Patient presents with  . Follow-up    History of Present Illness: 75 yo male with history of paroxysmal atrial fibrillation/flutter, systolic CHF, non-ischemic cardiomyopathy, etoh abuse, GI bleeding, CVA, bladder cancer who is here today for cardiac follow up. He was admitted to [facility] 12/09/15 with acute CHF in the setting of new onset atrial fibrillation with RVR. LVEF was found to be 20-25%. Stress myoview with apical scar. Cardiac catheterization on 12/08/15 without evidence of CAD. His non-ischemic cardiomyopathy was felt to be tachycardia related vs etoh related. He was discharged home on Eliquis 5 mg po BID, beta blocker and Ace-inh. He was seen here in our office 12/15/15 by Nell Range, PA-C and was doing well.   He is here today for follow up. He denies chest pain or shortness of breath. No lower extremity edema, orthopnea or PND. No dizziness or syncope. No blood in his stool or urine. He stopped drinking alcohol.    Primary Care Physician: Merrilee Seashore, MD   Past Medical History:  Diagnosis Date  . Atrial fibrillation, persistent ([place])   . Bladder cancer ([place])   . Chronic systolic CHF (congestive heart failure) ([place])   . Non-ischemic cardiomyopathy ([place])   . Thyroid disease     Past Surgical History:  Procedure Laterality Date  . BLADDER SURGERY    . CARDIAC CATHETERIZATION N/A 12/08/2015   Procedure: Left Heart Cath and Coronary Angiography;  Surgeon: Lorretta Harp, MD;  Location: [place] [facility];  Service: Cardiovascular;  Laterality: N/A;    Current Outpatient Prescriptions  Medication Sig Dispense Refill  . apixaban (ELIQUIS) 5 MG TABS tablet Take 1 tablet (5 mg total) by mouth 2 (two) times daily. 60 tablet 11  . carvedilol (COREG) 6.25 MG tablet Take 1 tablet (6.25 mg total) by mouth 2 (two) times daily. 60 tablet 6  . furosemide (LASIX) 40 MG tablet Take one tablet by mouth twice daily for one week and then one tablet daily 40 tablet  2  . gabapentin (NEURONTIN) 100 MG capsule Take 200 mg by mouth 3 (three) times daily.    Marland Kitchen KLOR-CON M20 20 MEQ tablet TAKE 1 TABLET BY MOUTH EVERY DAY 30 tablet 2  . levothyroxine (SYNTHROID, LEVOTHROID) 50 MCG tablet Take 50 mcg by mouth daily before breakfast.    . lisinopril (PRINIVIL,ZESTRIL) 2.5 MG tablet TAKE 1 TABLET BY MOUTH EVERY DAY 30 tablet 2  . Multiple Vitamin (MULTIVITAMIN WITH MINERALS) TABS tablet Take 1 tablet by mouth daily. 30 tablet 0   No current facility-administered medications for this visit.     Allergies  Allergen Reactions  . Asa [Aspirin] Hives    Social History   Social History  . Marital status: Divorced    Spouse name: N/A  . Number of children: N/A  . Years of education: N/A   Occupational History  . Not on file.   Social History Main Topics  . Smoking status: Never Smoker  . Smokeless tobacco: Not on file  . Alcohol use Yes     Comment: 2 or 3 shots a day  . Drug use: No  . Sexual activity: Not on file   Other Topics Concern  . Not on file   Social History Narrative  . No narrative on file    Family History  Problem Relation Age of Onset  . Kidney disease Sister   . Heart attack Father 29  . Lung cancer Sister   . CVA Sister   .  Diabetes Sister   . Hypertension Sister     Review of Systems:  As stated in the HPI and otherwise negative.   BP 102/74 (BP Location: Right Arm, Patient Position: Sitting, Cuff Size: Normal)   Pulse 88   Ht 5\' 7"  (1.702 m)   Wt 75.4 kg (166 lb 3.2 oz)   BMI 26.03 kg/m   Physical Examination: General: Well developed, well nourished, NAD  HEENT: OP clear, mucus membranes moist  SKIN: warm, dry. No rashes. Neuro: No focal deficits  Musculoskeletal: Muscle strength 5/5 all ext  Psychiatric: Mood and affect normal  Neck: No JVD, no carotid bruits, no thyromegaly, no lymphadenopathy.  Lungs:Clear bilaterally, no wheezes, rhonci, crackles Cardiovascular: Regular rate and rhythm. No murmurs,  gallops or rubs. Abdomen:Soft. Bowel sounds present. Non-tender.  Extremities: No lower extremity edema. Pulses are 2 + in the bilateral DP/PT.  EKG:  EKG is not ordered today. The ekg ordered today demonstrates   Recent Labs: 12/05/2015: ALT 36; B Natriuretic Peptide 971.0; Magnesium 1.9 12/06/2015: TSH 4.961 12/09/2015: BUN 11; Creatinine, Ser 1.09; Hemoglobin 13.9; Platelets 123; Potassium 4.1; Sodium 137   Lipid Panel   Wt Readings from Last 3 Encounters:  04/11/16 75.4 kg (166 lb 3.2 oz)  03/30/16 74.2 kg (163 lb 8 oz)  02/15/16 76.2 kg (168 lb)     Other studies Reviewed: Additional studies/ records that were reviewed today include: . Review of the above records demonstrates:   Assessment and Plan:   1. Non-ischemic cardiomyopathy: LVEF 20-25% by echo May 2017. LHC 12/08/15 demonstrated a left dominant system, no evidence of CAD and a dilated severely hypocontractile LV consistent with nonischemic cardiomyopathy. Likely tachy induced from rapid Afib/flutter vs ETOH abuse. Will continue Coreg and lisinopril.Repeat Echo now. If no improvement in LV systolic function he may need a referral to EP for an ICD for primary prevention.  2. Chronic Systolic CHF: His weight is stable. NO LE edema. Continue Lasix.   3. Atrial Fib/Flutter: rate is controlled on Coreg. CHA2DS2 VASc score of 6 (CHF, HTN, CVA, age). Continue Eliquis, Coreg. No plans for cardioversion right now.   Current medicines are reviewed at length with the patient today.  The patient does not have concerns regarding medicines.  The following changes have been made:  no change  Labs/ tests ordered today include:   Orders Placed This Encounter  Procedures  . ECHOCARDIOGRAM COMPLETE     Disposition:   FU with me in 6 months   Signed, Lauree Chandler, MD 04/11/2016 12:13 PM    [facility] [address], [place], [place]  [address] Phone: [phone]; Fax: [phone]

## 2016-04-11 ENCOUNTER — Encounter: Payer: Self-pay | Admitting: Cardiovascular Disease

## 2016-04-11 ENCOUNTER — Ambulatory Visit (INDEPENDENT_AMBULATORY_CARE_PROVIDER_SITE_OTHER): Payer: Medicare Other | Admitting: Cardiovascular Disease

## 2016-04-11 VITALS — BP 102/74 | HR 88 | Ht 67.0 in | Wt 166.2 lb

## 2016-04-11 DIAGNOSIS — I5022 Chronic systolic (congestive) heart failure: Secondary | ICD-10-CM | POA: Diagnosis not present

## 2016-04-11 DIAGNOSIS — I481 Persistent atrial fibrillation: Secondary | ICD-10-CM | POA: Diagnosis not present

## 2016-04-11 DIAGNOSIS — I4819 Other persistent atrial fibrillation: Secondary | ICD-10-CM

## 2016-04-11 DIAGNOSIS — I429 Cardiomyopathy, unspecified: Secondary | ICD-10-CM

## 2016-04-11 MED ORDER — APIXABAN 5 MG PO TABS: 5.0000 mg | ORAL_TABLET | Freq: Two times a day (BID) | ORAL | 11 refills | Status: DC

## 2016-04-11 NOTE — Patient Instructions (Signed)

## 2016-04-25 ENCOUNTER — Other Ambulatory Visit: Payer: Self-pay

## 2016-04-25 ENCOUNTER — Ambulatory Visit (HOSPITAL_COMMUNITY): Payer: Medicare Other | Attending: Cardiovascular Disease

## 2016-04-25 DIAGNOSIS — I429 Cardiomyopathy, unspecified: Secondary | ICD-10-CM | POA: Insufficient documentation

## 2016-04-25 DIAGNOSIS — I517 Cardiomegaly: Secondary | ICD-10-CM | POA: Diagnosis not present

## 2016-04-25 DIAGNOSIS — I501 Left ventricular failure: Secondary | ICD-10-CM | POA: Insufficient documentation

## 2016-04-25 LAB — ECHOCARDIOGRAM COMPLETE
Ao-asc: 32 cm
CHL CUP DOP CALC LVOT VTI: 17.9 cm
CHL CUP RV SYS PRESS: 27 mmHg
FS: 23 % — AB (ref 28–44)
IV/PV OW: 0.87
LA ID, A-P, ES: 34 mm
LA diam end sys: 34 mm
LA diam index: 1.82 cm/m2
LA vol A4C: 72.8 ml
LA vol index: 39.3 mL/m2
LAVOL: 73.5 mL
LDCA: 3.8 cm2
LV PW d: 9.9 mm — AB (ref 0.6–1.1)
LVOT peak vel: 81.3 cm/s
LVOTD: 22 mm
LVOTSV: 68 mL
Reg peak vel: 217 cm/s
TR max vel: 217 cm/s

## 2016-04-30 ENCOUNTER — Ambulatory Visit: Payer: Self-pay | Admitting: *Deleted

## 2016-04-30 ENCOUNTER — Encounter: Payer: Self-pay | Admitting: Internal Medicine

## 2016-04-30 ENCOUNTER — Ambulatory Visit (INDEPENDENT_AMBULATORY_CARE_PROVIDER_SITE_OTHER): Payer: Medicare Other | Admitting: Internal Medicine

## 2016-04-30 VITALS — BP 106/72 | HR 103 | Ht 67.5 in | Wt 165.0 lb

## 2016-04-30 DIAGNOSIS — I428 Other cardiomyopathies: Secondary | ICD-10-CM | POA: Diagnosis not present

## 2016-04-30 DIAGNOSIS — Z01812 Encounter for preprocedural laboratory examination: Secondary | ICD-10-CM

## 2016-04-30 MED ORDER — LOSARTAN POTASSIUM 25 MG PO TABS: 25.0000 mg | ORAL_TABLET | Freq: Every day | ORAL | 3 refills | Status: DC

## 2016-04-30 NOTE — Progress Notes (Signed)
HPI Mr. Goelz is referred today for evaluation of LV dysfunction for ICD implant. The patient carries a diagnosis of both atrial fib and flutter. However when I looked into his ECG's he has only atrial flutter on his ECG's. He had a cardiac cath which demonstrated a non-ischemic CM. He has not had syncope. No palpitations. He does not know that he is out of rhythm.  Allergies  Allergen Reactions  . Asa [Aspirin] Hives     Current Outpatient Prescriptions  Medication Sig Dispense Refill  . apixaban (ELIQUIS) 5 MG TABS tablet Take 1 tablet (5 mg total) by mouth 2 (two) times daily. 60 tablet 11  . carvedilol (COREG) 6.25 MG tablet Take 1 tablet (6.25 mg total) by mouth 2 (two) times daily. 60 tablet 6  . furosemide (LASIX) 40 MG tablet Take 40 mg by mouth daily.    Marland Kitchen gabapentin (NEURONTIN) 100 MG capsule Take 200 mg by mouth 3 (three) times daily.    Marland Kitchen KLOR-CON M20 20 MEQ tablet TAKE 1 TABLET BY MOUTH EVERY DAY 30 tablet 2  . levothyroxine (SYNTHROID, LEVOTHROID) 50 MCG tablet Take 50 mcg by mouth daily before breakfast.    . Multiple Vitamin (MULTIVITAMIN WITH MINERALS) TABS tablet Take 1 tablet by mouth daily. 30 tablet 0  . losartan (COZAAR) 25 MG tablet Take 1 tablet (25 mg total) by mouth daily. 30 tablet 3   No current facility-administered medications for this visit.      Past Medical History:  Diagnosis Date  . Atrial fibrillation, persistent ([place])   . Bladder cancer ([place])   . Chronic systolic CHF (congestive heart failure) ([place])   . Non-ischemic cardiomyopathy ([place])   . Thyroid disease     ROS:   All systems reviewed and negative except as noted in the HPI.   Past Surgical History:  Procedure Laterality Date  . BLADDER SURGERY    . CARDIAC CATHETERIZATION N/A 12/08/2015   Procedure: Left Heart Cath and Coronary Angiography;  Surgeon: Lorretta Harp, MD;  Location: [facility];  Service: Cardiovascular;  Laterality: N/A;     Family History    Problem Relation Age of Onset  . Kidney disease Sister   . Heart attack Father 8  . Lung cancer Sister   . CVA Sister   . Diabetes Sister   . Hypertension Sister      Social History   Social History  . Marital status: Divorced    Spouse name: N/A  . Number of children: N/A  . Years of education: N/A   Occupational History  . Not on file.   Social History Main Topics  . Smoking status: Never Smoker  . Smokeless tobacco: Not on file  . Alcohol use Yes     Comment: 2 or 3 shots a day  . Drug use: No  . Sexual activity: Not on file   Other Topics Concern  . Not on file   Social History Narrative  . No narrative on file     BP 106/72   Pulse (!) 103   Ht 5' 7.5" (1.715 m)   Wt 165 lb (74.8 kg)   BMI 25.46 kg/m   Physical Exam:  Well appearing NAD HEENT: Unremarkable Neck:  No JVD, no thyromegally Lymphatics:  No adenopathy Back:  No CVA tenderness Lungs:  Clear HEART:  Regular rate rhythm, no murmurs, no rubs, no clicks Abd:  soft, positive bowel sounds, no organomegally, no rebound, no guarding  Ext:  2 plus pulses, no edema, no cyanosis, no clubbing Skin:  No rashes no nodules Neuro:  CN II through XII intact, motor grossly intact  EKG - atrial flutter with a RVR   Assess/Plan: 1. Atrial flutter -his notes all say that he has atrial fib but I only see atrial fib. I would recommend he continue his anti-coagulation and undergo atrial flutter ablation, plus or minus amiodarone to see if he has a rate related CM rather than a non-ischemic CM. I have discussed the risks/benefits/goals/expectations of the procedure and he wishes to proceed. 2. Chronic systolic heart failure - he is class 2. He may have a reversible cause. I have suggested he continue his current meds except switch to losartan from lisinopril.  3. Cough - he notes a cough and I suspect this is due to lisinopril.  4. ETOH abuse - he denies active ETOH abuse.  [name].D.

## 2016-04-30 NOTE — Patient Instructions (Addendum)
./  Medication Instructions:  Your physician has recommended you make the following change in your medication:  STOP Lisinopril START Losartan 25 mg once a day   Labwork: BMET/CBC 1-2 weeks prior to procedure   Testing/Procedures: Your physician has recommended that you have an ablation. Catheter ablation is a medical procedure used to treat some cardiac arrhythmias (irregular heartbeats). During catheter ablation, a long, thin, flexible tube is put into a blood vessel in your groin (upper thigh), or neck. This tube is called an ablation catheter. It is then guided to your heart through the blood vessel. Radio frequency waves destroy small areas of heart tissue where abnormal heartbeats may cause an arrhythmia to start. Please see the instruction sheet given to you today.    Follow-Up: Follow-up 4 weeks from 05/17/16 with Dr. Lovena Le.  Any Other Special Instructions Will Be Listed Below ---  Report to [facility] on 05/17/16 at 7:30 AM  Nothing to eat or drink after midnight the night before procedure  Do not take any medication day of procedure  Have someone to drive you home OR plan 1 night stay    If you need a refill on your cardiac medications before your next appointment, please call your pharmacy.

## 2016-05-01 DIAGNOSIS — L602 Onychogryphosis: Secondary | ICD-10-CM | POA: Diagnosis not present

## 2016-05-01 DIAGNOSIS — L84 Corns and callosities: Secondary | ICD-10-CM | POA: Diagnosis not present

## 2016-05-14 ENCOUNTER — Other Ambulatory Visit: Payer: Medicare Other | Admitting: *Deleted

## 2016-05-14 DIAGNOSIS — Z79899 Other long term (current) drug therapy: Secondary | ICD-10-CM | POA: Diagnosis not present

## 2016-05-14 DIAGNOSIS — Z01812 Encounter for preprocedural laboratory examination: Secondary | ICD-10-CM | POA: Diagnosis not present

## 2016-05-14 LAB — CBC WITH DIFFERENTIAL/PLATELET
Basophils Absolute: 0 cells/uL (ref 0–200)
Basophils Relative: 0 %
EOS PCT: 4 %
Eosinophils Absolute: 312 cells/uL (ref 15–500)
HCT: 41.6 % (ref 38.5–50.0)
Hemoglobin: 14.4 g/dL (ref 13.2–17.1)
Lymphocytes Relative: 29 %
Lymphs Abs: 2262 cells/uL (ref 850–3900)
MCH: 31.8 pg (ref 27.0–33.0)
MCHC: 34.6 g/dL (ref 32.0–36.0)
MCV: 91.8 fL (ref 80.0–100.0)
MPV: 11.1 fL (ref 7.5–12.5)
Monocytes Absolute: 1092 cells/uL — ABNORMAL HIGH (ref 200–950)
Monocytes Relative: 14 %
NEUTROS PCT: 53 %
Neutro Abs: 4134 cells/uL (ref 1500–7800)
Platelets: 144 10*3/uL (ref 140–400)
RBC: 4.53 MIL/uL (ref 4.20–5.80)
RDW: 14.2 % (ref 11.0–15.0)
WBC: 7.8 10*3/uL (ref 3.8–10.8)

## 2016-05-14 LAB — BASIC METABOLIC PANEL
BUN: 20 mg/dL (ref 7–25)
CO2: 25 mmol/L (ref 20–31)
Calcium: 9 mg/dL (ref 8.6–10.3)
Chloride: 103 mmol/L (ref 98–110)
Creat: 1.01 mg/dL (ref 0.70–1.18)
GLUCOSE: 99 mg/dL (ref 65–99)
Potassium: 4.7 mmol/L (ref 3.5–5.3)
SODIUM: 140 mmol/L (ref 135–146)

## 2016-05-17 ENCOUNTER — Ambulatory Visit (HOSPITAL_COMMUNITY)
Admission: RE | Admit: 2016-05-17 | Discharge: 2016-05-18 | Disposition: A | Discharge status: Home / Self Care | Payer: Medicare Other | Source: Ambulatory Visit | Attending: Internal Medicine | Admitting: Internal Medicine

## 2016-05-17 ENCOUNTER — Encounter (HOSPITAL_COMMUNITY): Payer: Self-pay | Admitting: Internal Medicine

## 2016-05-17 ENCOUNTER — Encounter (HOSPITAL_COMMUNITY)
Admission: RE | Disposition: A | Discharge status: Home / Self Care | Payer: Self-pay | Source: Ambulatory Visit | Attending: Internal Medicine

## 2016-05-17 DIAGNOSIS — Z7901 Long term (current) use of anticoagulants: Secondary | ICD-10-CM | POA: Diagnosis not present

## 2016-05-17 DIAGNOSIS — E079 Disorder of thyroid, unspecified: Secondary | ICD-10-CM | POA: Diagnosis not present

## 2016-05-17 DIAGNOSIS — Z8249 Family history of ischemic heart disease and other diseases of the circulatory system: Secondary | ICD-10-CM | POA: Diagnosis not present

## 2016-05-17 DIAGNOSIS — I483 Typical atrial flutter: Secondary | ICD-10-CM | POA: Diagnosis not present

## 2016-05-17 DIAGNOSIS — Z833 Family history of diabetes mellitus: Secondary | ICD-10-CM | POA: Diagnosis not present

## 2016-05-17 DIAGNOSIS — I4892 Unspecified atrial flutter: Secondary | ICD-10-CM | POA: Diagnosis present

## 2016-05-17 DIAGNOSIS — I5022 Chronic systolic (congestive) heart failure: Secondary | ICD-10-CM | POA: Diagnosis not present

## 2016-05-17 DIAGNOSIS — Z801 Family history of malignant neoplasm of trachea, bronchus and lung: Secondary | ICD-10-CM | POA: Insufficient documentation

## 2016-05-17 DIAGNOSIS — I428 Other cardiomyopathies: Secondary | ICD-10-CM | POA: Diagnosis not present

## 2016-05-17 DIAGNOSIS — I481 Persistent atrial fibrillation: Secondary | ICD-10-CM | POA: Insufficient documentation

## 2016-05-17 DIAGNOSIS — Z8551 Personal history of malignant neoplasm of bladder: Secondary | ICD-10-CM | POA: Diagnosis not present

## 2016-05-17 DIAGNOSIS — Z823 Family history of stroke: Secondary | ICD-10-CM | POA: Diagnosis not present

## 2016-05-17 HISTORY — DX: Personal history of other medical treatment: Z92.89

## 2016-05-17 HISTORY — DX: Migraine, unspecified, not intractable, without status migrainosus: G43.909

## 2016-05-17 HISTORY — DX: Hypothyroidism, unspecified: E03.9

## 2016-05-17 HISTORY — PX: ELECTROPHYSIOLOGIC STUDY: SHX172A

## 2016-05-17 SURGERY — A-FLUTTER/A-TACH/SVT ABLATION

## 2016-05-17 MED ORDER — FENTANYL CITRATE (PF) 100 MCG/2ML IJ SOLN
INTRAMUSCULAR | Status: AC
  Filled 2016-05-17: qty 2

## 2016-05-17 MED ORDER — GABAPENTIN 100 MG PO CAPS
200.0000 mg | ORAL_CAPSULE | Freq: Three times a day (TID) | ORAL | Status: DC
  Administered 2016-05-17 – 2016-05-18 (×3): 200 mg via ORAL
  Filled 2016-05-17 (×3): qty 2

## 2016-05-17 MED ORDER — BUPIVACAINE HCL (PF) 0.25 % IJ SOLN
INTRAMUSCULAR | Status: DC | PRN
  Administered 2016-05-17: 50 mL

## 2016-05-17 MED ORDER — SODIUM CHLORIDE 0.9% FLUSH
3.0000 mL | Freq: Two times a day (BID) | INTRAVENOUS | Status: DC
  Administered 2016-05-17 – 2016-05-18 (×3): 3 mL via INTRAVENOUS

## 2016-05-17 MED ORDER — HEPARIN (PORCINE) IN NACL 2-0.9 UNIT/ML-% IJ SOLN
INTRAMUSCULAR | Status: DC | PRN
  Administered 2016-05-17: 10:00:00

## 2016-05-17 MED ORDER — ACETAMINOPHEN 325 MG PO TABS
650.0000 mg | ORAL_TABLET | ORAL | Status: DC | PRN
  Administered 2016-05-18: 650 mg via ORAL
  Filled 2016-05-17: qty 2

## 2016-05-17 MED ORDER — SODIUM CHLORIDE 0.9 % IV SOLN
INTRAVENOUS | Status: DC
  Administered 2016-05-17: 08:00:00 via INTRAVENOUS

## 2016-05-17 MED ORDER — ADULT MULTIVITAMIN W/MINERALS CH
1.0000 | ORAL_TABLET | Freq: Every day | ORAL | Status: DC
  Administered 2016-05-17 – 2016-05-18 (×2): 1 via ORAL
  Filled 2016-05-17 (×2): qty 1

## 2016-05-17 MED ORDER — BUPIVACAINE HCL (PF) 0.25 % IJ SOLN
INTRAMUSCULAR | Status: AC
  Filled 2016-05-17: qty 60

## 2016-05-17 MED ORDER — LEVOTHYROXINE SODIUM 50 MCG PO TABS
50.0000 ug | ORAL_TABLET | Freq: Every day | ORAL | Status: DC
  Administered 2016-05-18: 50 ug via ORAL
  Filled 2016-05-17: qty 1

## 2016-05-17 MED ORDER — ONDANSETRON HCL 4 MG/2ML IJ SOLN: 4.0000 mg | Freq: Four times a day (QID) | INTRAMUSCULAR | Status: DC | PRN

## 2016-05-17 MED ORDER — SODIUM CHLORIDE 0.9 % IV SOLN: 250.0000 mL | INTRAVENOUS | Status: DC | PRN

## 2016-05-17 MED ORDER — FENTANYL CITRATE (PF) 100 MCG/2ML IJ SOLN
INTRAMUSCULAR | Status: DC | PRN
  Administered 2016-05-17: 12.5 ug via INTRAVENOUS
  Administered 2016-05-17 (×2): 25 ug via INTRAVENOUS

## 2016-05-17 MED ORDER — LOSARTAN POTASSIUM 25 MG PO TABS
25.0000 mg | ORAL_TABLET | Freq: Every day | ORAL | Status: DC
  Administered 2016-05-17 – 2016-05-18 (×2): 25 mg via ORAL
  Filled 2016-05-17 (×2): qty 1

## 2016-05-17 MED ORDER — CARVEDILOL 6.25 MG PO TABS
6.2500 mg | ORAL_TABLET | Freq: Two times a day (BID) | ORAL | Status: DC
  Administered 2016-05-17 – 2016-05-18 (×3): 6.25 mg via ORAL
  Filled 2016-05-17 (×3): qty 1

## 2016-05-17 MED ORDER — INFLUENZA VAC SPLIT QUAD 0.5 ML IM SUSY
0.5000 mL | PREFILLED_SYRINGE | INTRAMUSCULAR | Status: AC
  Administered 2016-05-18: 0.5 mL via INTRAMUSCULAR

## 2016-05-17 MED ORDER — SODIUM CHLORIDE 0.9% FLUSH: 3.0000 mL | INTRAVENOUS | Status: DC | PRN

## 2016-05-17 MED ORDER — MIDAZOLAM HCL 5 MG/5ML IJ SOLN
INTRAMUSCULAR | Status: AC
  Filled 2016-05-17: qty 5

## 2016-05-17 MED ORDER — MIDAZOLAM HCL 5 MG/5ML IJ SOLN
INTRAMUSCULAR | Status: DC | PRN
  Administered 2016-05-17: 2 mg via INTRAVENOUS
  Administered 2016-05-17: 1 mg via INTRAVENOUS
  Administered 2016-05-17: 2 mg via INTRAVENOUS

## 2016-05-17 MED ORDER — APIXABAN 5 MG PO TABS
5.0000 mg | ORAL_TABLET | Freq: Two times a day (BID) | ORAL | Status: DC
  Administered 2016-05-17: 5 mg via ORAL
  Filled 2016-05-17: qty 1

## 2016-05-17 MED ORDER — POTASSIUM CHLORIDE CRYS ER 20 MEQ PO TBCR
20.0000 meq | EXTENDED_RELEASE_TABLET | Freq: Every day | ORAL | Status: DC
  Administered 2016-05-17 – 2016-05-18 (×2): 20 meq via ORAL
  Filled 2016-05-17 (×2): qty 1

## 2016-05-17 MED ORDER — HEPARIN (PORCINE) IN NACL 2-0.9 UNIT/ML-% IJ SOLN
INTRAMUSCULAR | Status: AC
  Filled 2016-05-17: qty 500

## 2016-05-17 MED ORDER — FUROSEMIDE 40 MG PO TABS
40.0000 mg | ORAL_TABLET | Freq: Every day | ORAL | Status: DC
  Administered 2016-05-17 – 2016-05-18 (×2): 40 mg via ORAL
  Filled 2016-05-17 (×2): qty 1

## 2016-05-17 SURGICAL SUPPLY — 10 items
BAG SNAP BAND KOVER 36X36 (MISCELLANEOUS) ×2 IMPLANT
CATH BLAZERPRIME XP (ABLATOR) ×2 IMPLANT
CATH JOSEPHSON QUAD-ALLRED 6FR (CATHETERS) ×2 IMPLANT
CATH POLARIS X 2.5/5/2.5 DECAP (CATHETERS) ×2 IMPLANT
PACK EP LATEX FREE (CUSTOM PROCEDURE TRAY) ×3
PACK EP LF (CUSTOM PROCEDURE TRAY) IMPLANT
PAD DEFIB LIFELINK (PAD) ×2 IMPLANT
SHEATH PINNACLE 6F 10CM (SHEATH) ×2 IMPLANT
SHEATH PINNACLE 8F 10CM (SHEATH) ×4 IMPLANT
SHIELD RADPAD SCOOP 12X17 (MISCELLANEOUS) ×2 IMPLANT

## 2016-05-17 NOTE — Interval H&P Note (Signed)
History and Physical Interval Note:  05/17/2016 7:42 AM  Lucas Fuller  has presented today for surgery, with the diagnosis of aflutter  The various methods of treatment have been discussed with the patient and family. After consideration of risks, benefits and other options for treatment, the patient has consented to  Procedure(s): A-Flutter Ablation (N/A) as a surgical intervention .  The patient's history has been reviewed, patient examined, no change in status, stable for surgery.  I have reviewed the patient's chart and labs.  Questions were answered to the patient's satisfaction.     [name]

## 2016-05-17 NOTE — Progress Notes (Signed)
Site area: Right groin a 6, 8 french X2, venous sheaths was removed  Site Prior to Removal:  Level 0  Pressure Applied For 15 MINUTES    Bedrest Beginning at 1200p  Manual:   Yes.    Patient Status During Pull:  stable  Post Pull Groin Site:  Level 0  Post Pull Instructions Given:  Yes.    Post Pull Pulses Present:  Yes.    Dressing Applied:  Yes.    Comments:  VS remain stable during sheath pull.

## 2016-05-17 NOTE — H&P (View-Only) (Signed)
HPI Lucas Fuller is referred today for evaluation of LV dysfunction for ICD implant. The patient carries a diagnosis of both atrial fib and flutter. However when I looked into his ECG's he has only atrial flutter on his ECG's. He had a cardiac cath which demonstrated a non-ischemic CM. He has not had syncope. No palpitations. He does not know that he is out of rhythm.  Allergies  Allergen Reactions  . Asa [Aspirin] Hives     Current Outpatient Prescriptions  Medication Sig Dispense Refill  . apixaban (ELIQUIS) 5 MG TABS tablet Take 1 tablet (5 mg total) by mouth 2 (two) times daily. 60 tablet 11  . carvedilol (COREG) 6.25 MG tablet Take 1 tablet (6.25 mg total) by mouth 2 (two) times daily. 60 tablet 6  . furosemide (LASIX) 40 MG tablet Take 40 mg by mouth daily.    Marland Kitchen gabapentin (NEURONTIN) 100 MG capsule Take 200 mg by mouth 3 (three) times daily.    Marland Kitchen KLOR-CON M20 20 MEQ tablet TAKE 1 TABLET BY MOUTH EVERY DAY 30 tablet 2  . levothyroxine (SYNTHROID, LEVOTHROID) 50 MCG tablet Take 50 mcg by mouth daily before breakfast.    . Multiple Vitamin (MULTIVITAMIN WITH MINERALS) TABS tablet Take 1 tablet by mouth daily. 30 tablet 0  . losartan (COZAAR) 25 MG tablet Take 1 tablet (25 mg total) by mouth daily. 30 tablet 3   No current facility-administered medications for this visit.      Past Medical History:  Diagnosis Date  . Atrial fibrillation, persistent ([place])   . Bladder cancer ([place])   . Chronic systolic CHF (congestive heart failure) ([place])   . Non-ischemic cardiomyopathy ([place])   . Thyroid disease     ROS:   All systems reviewed and negative except as noted in the HPI.   Past Surgical History:  Procedure Laterality Date  . BLADDER SURGERY    . CARDIAC CATHETERIZATION N/A 12/08/2015   Procedure: Left Heart Cath and Coronary Angiography;  Surgeon: Lorretta Harp, MD;  Location: [facility];  Service: Cardiovascular;  Laterality: N/A;     Family History    Problem Relation Age of Onset  . Kidney disease Sister   . Heart attack Father 2  . Lung cancer Sister   . CVA Sister   . Diabetes Sister   . Hypertension Sister      Social History   Social History  . Marital status: Divorced    Spouse name: N/A  . Number of children: N/A  . Years of education: N/A   Occupational History  . Not on file.   Social History Main Topics  . Smoking status: Never Smoker  . Smokeless tobacco: Not on file  . Alcohol use Yes     Comment: 2 or 3 shots a day  . Drug use: No  . Sexual activity: Not on file   Other Topics Concern  . Not on file   Social History Narrative  . No narrative on file     BP 106/72   Pulse (!) 103   Ht 5' 7.5" (1.715 m)   Wt 165 lb (74.8 kg)   BMI 25.46 kg/m   Physical Exam:  Well appearing NAD HEENT: Unremarkable Neck:  No JVD, no thyromegally Lymphatics:  No adenopathy Back:  No CVA tenderness Lungs:  Clear HEART:  Regular rate rhythm, no murmurs, no rubs, no clicks Abd:  soft, positive bowel sounds, no organomegally, no rebound, no guarding  Ext:  2 plus pulses, no edema, no cyanosis, no clubbing Skin:  No rashes no nodules Neuro:  CN II through XII intact, motor grossly intact  EKG - atrial flutter with a RVR   Assess/Plan: 1. Atrial flutter -his notes all say that he has atrial fib but I only see atrial fib. I would recommend he continue his anti-coagulation and undergo atrial flutter ablation, plus or minus amiodarone to see if he has a rate related CM rather than a non-ischemic CM. I have discussed the risks/benefits/goals/expectations of the procedure and he wishes to proceed. 2. Chronic systolic heart failure - he is class 2. He may have a reversible cause. I have suggested he continue his current meds except switch to losartan from lisinopril.  3. Cough - he notes a cough and I suspect this is due to lisinopril.  4. ETOH abuse - he denies active ETOH abuse.  [name].D.

## 2016-05-17 NOTE — Interval H&P Note (Signed)
History and Physical Interval Note:  05/17/2016 8:47 AM  Lucas Fuller  has presented today for surgery, with the diagnosis of aflutter  The various methods of treatment have been discussed with the patient and family. After consideration of risks, benefits and other options for treatment, the patient has consented to  Procedure(s): A-Flutter Ablation (N/A) as a surgical intervention .  The patient's history has been reviewed, patient examined, no change in status, stable for surgery.  I have reviewed the patient's chart and labs.  Questions were answered to the patient's satisfaction.     [name]

## 2016-05-18 ENCOUNTER — Ambulatory Visit (HOSPITAL_COMMUNITY): Payer: Medicare Other

## 2016-05-18 DIAGNOSIS — I5022 Chronic systolic (congestive) heart failure: Secondary | ICD-10-CM | POA: Diagnosis not present

## 2016-05-18 DIAGNOSIS — Z8551 Personal history of malignant neoplasm of bladder: Secondary | ICD-10-CM | POA: Diagnosis not present

## 2016-05-18 DIAGNOSIS — I483 Typical atrial flutter: Secondary | ICD-10-CM | POA: Diagnosis not present

## 2016-05-18 DIAGNOSIS — Z8249 Family history of ischemic heart disease and other diseases of the circulatory system: Secondary | ICD-10-CM | POA: Diagnosis not present

## 2016-05-18 DIAGNOSIS — Z833 Family history of diabetes mellitus: Secondary | ICD-10-CM | POA: Diagnosis not present

## 2016-05-18 DIAGNOSIS — Z7901 Long term (current) use of anticoagulants: Secondary | ICD-10-CM | POA: Diagnosis not present

## 2016-05-18 DIAGNOSIS — Z801 Family history of malignant neoplasm of trachea, bronchus and lung: Secondary | ICD-10-CM | POA: Diagnosis not present

## 2016-05-18 DIAGNOSIS — Z823 Family history of stroke: Secondary | ICD-10-CM | POA: Diagnosis not present

## 2016-05-18 DIAGNOSIS — E079 Disorder of thyroid, unspecified: Secondary | ICD-10-CM | POA: Diagnosis not present

## 2016-05-18 DIAGNOSIS — I428 Other cardiomyopathies: Secondary | ICD-10-CM | POA: Diagnosis not present

## 2016-05-18 DIAGNOSIS — I481 Persistent atrial fibrillation: Secondary | ICD-10-CM | POA: Diagnosis not present

## 2016-05-18 MED ORDER — FENTANYL CITRATE (PF) 100 MCG/2ML IJ SOLN
50.0000 ug | INTRAMUSCULAR | Status: DC | PRN
  Administered 2016-05-18: 50 ug via INTRAVENOUS
  Filled 2016-05-18: qty 2

## 2016-05-18 MED ORDER — APIXABAN 5 MG PO TABS
5.0000 mg | ORAL_TABLET | Freq: Two times a day (BID) | ORAL | Status: DC
  Administered 2016-05-18: 5 mg via ORAL
  Filled 2016-05-18: qty 1

## 2016-05-18 MED ORDER — NITROGLYCERIN 0.4 MG SL SUBL
SUBLINGUAL_TABLET | SUBLINGUAL | Status: AC
  Administered 2016-05-18: 02:00:00
  Filled 2016-05-18: qty 1

## 2016-05-18 NOTE — Progress Notes (Signed)
D/C home, self care.

## 2016-05-18 NOTE — Progress Notes (Signed)
Pt complained of 8/10 chest pain. Vital signs 141/88 hr 76. Performed EKG. Gave one sublingual nitro. Pt advised did not really relieve pain. BP dropped to 125/71 HR 69. Paged on call MD.

## 2016-05-18 NOTE — Progress Notes (Signed)
Pt complains of 10/10 chest pain. Notified MD

## 2016-05-18 NOTE — Discharge Instructions (Signed)
No driving for 1 week. No lifting over 5 lbs for 1 week. No vigorous or sexual activity for 1 week. You may return to work on 05/25/16. Keep procedure site clean & dry. If you notice increased pain, swelling, bleeding or pus, call/return!  You may shower, but no soaking baths/hot tubs/pools for 1 week.

## 2016-05-18 NOTE — Progress Notes (Signed)
Spoke with Lucas Standard, PA who stated patient does not need Echo, to discard order, thus Order discontinued. Patient updated. Awaiting ride to transport home.

## 2016-05-18 NOTE — Progress Notes (Addendum)
Called by nurse for patient with recurrent CP.   75 y/o male with NICM who underwent AFL ablation earlier today. Patient had CP earlier this evening which improved. Now with recurrent severe CP.   Denies positional component per nurse. BP stable with SBP 130-140.Marland Kitchen Patient not tachycardic. ECG without evidence of ischemia or pericarditis.  Will treat with fentanyl. Continue to monitor. Echo in am to further assess for possible effusion.   Will hold apixaban until echo performed this am.  Diana Armijo,MD 5:08 AM

## 2016-05-18 NOTE — Discharge Summary (Signed)
ELECTROPHYSIOLOGY PROCEDURE DISCHARGE SUMMARY    Patient ID: Lucas Fuller,  MRN: [id number], DOB/AGE: 76-Jun-1942 76 y.o.  Admit date: 05/17/2016 Discharge date: 05/18/2016  Primary Care Physician: Merrilee Seashore, MD  Primary Cardiologist: Dr. Angelena Form Electrophysiologist: Dr. Lovena Le  Primary Discharge Diagnosis:  1. Aflutter     CHA2DS2Vasc is at least 3 on Eliquis  Secondary Discharge Diagnosis:  1. NICM     Compensated by exam     On BB/ACE  Allergies  Allergen Reactions  . Asa [Aspirin] Hives     Procedures This Admission: 1.  Electrophysiology study and radiofrequency catheter ablation on 05/17/16 by Dr Lovena Le.   CONCLUSIONS:  1. Isthmus-dependent right atrial flutter upon presentation.  2. Successful radiofrequency ablation of atrial flutter along the cavotricuspid isthmus with complete bidirectional isthmus block achieved.  3. No inducible arrhythmias following ablation.  4. No early apparent complications.  Brief HPI: Lucas Fuller is a 75 y.o. male with a past medical history as outlined above.  Risks, benefits, and alternatives to ablation were reviewed with the patient who wished to proceed.   Hospital Course:  The patient was admitted and underwent EPS/RFCA with details as outlined above. He was monitored on telemetry overnight which demonstrated SR, occ PAC's, blocked PACs, PVC.  The patient developed a pleuritic type CP with stable VSS, and echo was ordered though Dr. Lovena Le saw and examined patient this morning and felt no need for echo to be done.  Groin site stable without complication.  He was examined by Dr. Lovena Le and considered stable for discharge to home.  Follow up will be arranged in 4 weeks. Wound care and restrictions were reviewed with the patient prior to discharge.   Patient is observed to ambulate in the hallway without difficulty or symptoms prior to discharge  Physical Exam: Vitals:   05/18/16 0148 05/18/16 0153 05/18/16 0417  05/18/16 0755  BP: (!) 141/88 125/71 134/76 (!) 142/91  Pulse: 76 69 90 95  Resp: (!) 24  (!) 27 (!) 23  Temp:   98.1 F (36.7 C) 99.1 F (37.3 C)  TempSrc:   Oral Oral  SpO2: 97% 97% 95% 93%  Weight:      Height:        GEN- The patient is well appearing, alert and oriented x 3 today.   HEENT: normocephalic, atraumatic; sclera clear, conjunctiva pink; hearing intact; oropharynx clear; neck supple, no JVP Lymph- no cervical lymphadenopathy Lungs- Clear to ausculation bilaterally, normal work of breathing (not tachypnic).  No wheezes, rales, rhonchi Heart- Regular rate and rhythm, no murmurs, no rubs or gallops, heart sounds not distant, PMI not laterally displaced GI- soft, non-tender, non-distended, bowel sounds present, no hepatosplenomegaly Extremities- no clubbing, cyanosis, or edema; DP/PT/radial pulses 2+ bilaterally, groin without hematoma/bruit MS- no significant deformity or atrophy Skin- warm and dry, no rash or lesion Psych- euthymic mood, full affect Neuro- strength and sensation are intact   Discharge Vitals: Blood pressure 134/76, pulse 90, temperature 98.1 F (36.7 C), temperature source Oral, resp. rate (!) 23, height 5' 7.5" (1.715 m), weight 165 lb (74.8 kg), SpO2 95 %   Labs:   Lab Results  Component Value Date   WBC 7.8 05/14/2016   HGB 14.4 05/14/2016   HCT 41.6 05/14/2016   MCV 91.8 05/14/2016   PLT 144 05/14/2016     Recent Labs Lab 05/14/16 0859  NA 140  K 4.7  CL 103  CO2 25  BUN 20  CREATININE 1.01  CALCIUM 9.0  GLUCOSE 99    Discharge Medications:    Medication List    TAKE these medications   apixaban 5 MG Tabs tablet Commonly known as:  ELIQUIS Take 1 tablet (5 mg total) by mouth 2 (two) times daily.   carvedilol 6.25 MG tablet Commonly known as:  COREG Take 1 tablet (6.25 mg total) by mouth 2 (two) times daily.   furosemide 40 MG tablet Commonly known as:  LASIX Take 40 mg by mouth daily.   gabapentin 100 MG  capsule Commonly known as:  NEURONTIN Take 200 mg by mouth 3 (three) times daily.   KLOR-CON M20 20 MEQ tablet Generic drug:  potassium chloride SA TAKE 1 TABLET BY MOUTH EVERY DAY What changed:  See the new instructions.   levothyroxine 50 MCG tablet Commonly known as:  SYNTHROID, LEVOTHROID Take 50 mcg by mouth daily before breakfast.   losartan 25 MG tablet Commonly known as:  COZAAR Take 1 tablet (25 mg total) by mouth daily.   multivitamin with minerals Tabs tablet Take 1 tablet by mouth daily.       Disposition:  Home Discharge Instructions    Diet - low sodium heart healthy    Complete by:  As directed    Increase activity slowly    Complete by:  As directed      Follow-up Information    [name], MD Follow up on 06/20/2016.   Specialty:  Cardiology Why:  2:00PM Contact information: [address] [address] [phone]           Duration of Discharge Encounter: Greater than 30 minutes including physician time.  Venetia Night, PA-C 05/18/2016 8:50 AM   EP Attending  Patient seen and examined. Agree with the findings as noted above. He is stable for DC home and will continue meds as noted above.  [name].D.

## 2016-05-25 ENCOUNTER — Other Ambulatory Visit: Payer: Self-pay | Admitting: Cardiovascular Disease

## 2016-06-04 DIAGNOSIS — E782 Mixed hyperlipidemia: Secondary | ICD-10-CM | POA: Diagnosis not present

## 2016-06-04 DIAGNOSIS — I10 Essential (primary) hypertension: Secondary | ICD-10-CM | POA: Diagnosis not present

## 2016-06-04 DIAGNOSIS — E039 Hypothyroidism, unspecified: Secondary | ICD-10-CM | POA: Diagnosis not present

## 2016-06-20 ENCOUNTER — Encounter: Payer: Self-pay | Admitting: Internal Medicine

## 2016-06-20 ENCOUNTER — Ambulatory Visit (INDEPENDENT_AMBULATORY_CARE_PROVIDER_SITE_OTHER): Payer: Medicare Other | Admitting: Internal Medicine

## 2016-06-20 VITALS — BP 118/80 | HR 73 | Ht 67.5 in | Wt 176.0 lb

## 2016-06-20 DIAGNOSIS — I428 Other cardiomyopathies: Secondary | ICD-10-CM | POA: Diagnosis not present

## 2016-06-20 MED ORDER — ASPIRIN EC 81 MG PO TBEC: 81.0000 mg | DELAYED_RELEASE_TABLET | Freq: Every day | ORAL | 6 refills | Status: DC

## 2016-06-20 NOTE — Patient Instructions (Addendum)
Medication Instructions:  Your physician recommends that you continue on your current medications as directed. Please refer to the Current Medication list given to you today.  1) STOP Eliquis (after you finish current prescription) 2) Once you have stopped Eliquis, you may START Aspirin 81 mg daily  (Pt stated he has taken baby aspirin 81 mg WITHOUT allergic reaction. Informed to stop taking immediately if pt develops rash)   Labwork: None Ordered   Testing/Procedures: None Ordered   Follow-Up: Your physician wants you to follow-up in: 6 months with Dr. Lovena Le. You will receive a reminder letter in the mail two months in advance. If you don't receive a letter, please call our office to schedule the follow-up appointment.   Any Other Special Instructions Will Be Listed Below (If Applicable).     If you need a refill on your cardiac medications before your next appointment, please call your pharmacy.

## 2016-06-20 NOTE — Progress Notes (Signed)
HPI [name] is referred today for evaluation of LV dysfunction for ICD implant. The patient carries a diagnosis of both atrial fib and flutter. However when I looked into his ECG's he has only atrial flutter on his ECG's. He has undergone catheter ablatio of typical atrial flutter. He has had no recurrent symptomatic arrhythmias. He denies chest pain or sob. No palpitations. Allergies  Allergen Reactions  . Asa [Aspirin] Hives     Current Outpatient Prescriptions  Medication Sig Dispense Refill  . apixaban (ELIQUIS) 5 MG TABS tablet Take 1 tablet (5 mg total) by mouth 2 (two) times daily. 60 tablet 11  . carvedilol (COREG) 6.25 MG tablet Take 1 tablet (6.25 mg total) by mouth 2 (two) times daily. 60 tablet 6  . furosemide (LASIX) 40 MG tablet Take 40 mg by mouth daily.    Marland Kitchen gabapentin (NEURONTIN) 100 MG capsule Take 200 mg by mouth 3 (three) times daily.    Marland Kitchen KLOR-CON M20 20 MEQ tablet TAKE 1 TABLET BY MOUTH EVERY DAY 30 tablet 11  . levothyroxine (SYNTHROID, LEVOTHROID) 50 MCG tablet Take 50 mcg by mouth daily before breakfast.    . losartan (COZAAR) 25 MG tablet Take 1 tablet (25 mg total) by mouth daily. 30 tablet 3  . Multiple Vitamin (MULTIVITAMIN WITH MINERALS) TABS tablet Take 1 tablet by mouth daily. 30 tablet 0   No current facility-administered medications for this visit.      Past Medical History:  Diagnosis Date  . Atrial fibrillation, persistent ([place])   . Bladder cancer ([place])   . Chronic systolic CHF (congestive heart failure) ([place])   . History of blood transfusion   . Hypothyroidism   . Migraine    "years ago" (05/17/2016)  . Non-ischemic cardiomyopathy ([place])   . Thyroid disease     ROS:   All systems reviewed and negative except as noted in the HPI.   Past Surgical History:  Procedure Laterality Date  . CARDIAC CATHETERIZATION N/A 12/08/2015   Procedure: Left Heart Cath and Coronary Angiography;  Surgeon: Lorretta Harp, MD;  Location: [facility];  Service: Cardiovascular;  Laterality: N/A;  . CYSTOSCOPY WITH FULGERATION  2008   Archie Endo 11/23/2010  . ELECTROPHYSIOLOGIC STUDY N/A 05/17/2016   Procedure: A-Flutter Ablation;  Surgeon: Evans Lance, MD;  Location: [facility];  Service: Cardiovascular;  Laterality: N/A;  . TONSILLECTOMY    . TRANSURETHRAL RESECTION OF BLADDER  2008   Archie Endo 11/23/2010     Family History  Problem Relation Age of Onset  . Heart attack Father 88  . Kidney disease Sister   . Hypertension Sister   . Lung cancer Sister   . CVA Sister   . Diabetes Sister      Social History   Social History  . Marital status: Divorced    Spouse name: N/A  . Number of children: N/A  . Years of education: N/A   Occupational History  . Not on file.   Social History Main Topics  . Smoking status: Never Smoker  . Smokeless tobacco: Never Used  . Alcohol use Yes     Comment: 05/17/2016 "stopped drinking ~ 09/2015"  . Drug use: No  . Sexual activity: Not Currently   Other Topics Concern  . Not on file   Social History Narrative  . No narrative on file     BP 118/80   Pulse 73   Ht 5' 7.5" (1.715 m)  Wt 176 lb (79.8 kg)   BMI 27.16 kg/m   Physical Exam:  Well appearing NAD HEENT: Unremarkable Neck:  No JVD, no thyromegally Lymphatics:  No adenopathy Back:  No CVA tenderness Lungs:  Clear HEART:  Regular rate rhythm, no murmurs, no rubs, no clicks Abd:  soft, positive bowel sounds, no organomegally, no rebound, no guarding Ext:  2 plus pulses, no edema, no cyanosis, no clubbing Skin:  No rashes no nodules Neuro:  CN II through XII intact, motor grossly intact  EKG - nsr   Assess/Plan: 1. Atrial flutter - he is doing well after catheter ablation. He will stop his eliquis after his current dose and start ASA 81 mg daily 2. Chronic systolic heart failure - he is class 2. He may have a reversible cause. I have suggested he continue his current meds. Would consider a  repeat 2D echo in the future. 3. ETOH abuse - he denies active ETOH abuse.  [name].D.

## 2016-06-21 DIAGNOSIS — E782 Mixed hyperlipidemia: Secondary | ICD-10-CM | POA: Diagnosis not present

## 2016-06-21 DIAGNOSIS — M150 Primary generalized (osteo)arthritis: Secondary | ICD-10-CM | POA: Diagnosis not present

## 2016-06-21 DIAGNOSIS — I5022 Chronic systolic (congestive) heart failure: Secondary | ICD-10-CM | POA: Diagnosis not present

## 2016-06-21 DIAGNOSIS — E039 Hypothyroidism, unspecified: Secondary | ICD-10-CM | POA: Diagnosis not present

## 2016-06-25 DIAGNOSIS — M205X2 Other deformities of toe(s) (acquired), left foot: Secondary | ICD-10-CM | POA: Diagnosis not present

## 2016-06-25 DIAGNOSIS — L602 Onychogryphosis: Secondary | ICD-10-CM | POA: Diagnosis not present

## 2016-06-25 DIAGNOSIS — M79671 Pain in right foot: Secondary | ICD-10-CM | POA: Diagnosis not present

## 2016-06-25 DIAGNOSIS — L84 Corns and callosities: Secondary | ICD-10-CM | POA: Diagnosis not present

## 2016-06-25 DIAGNOSIS — M205X1 Other deformities of toe(s) (acquired), right foot: Secondary | ICD-10-CM | POA: Diagnosis not present

## 2016-07-09 ENCOUNTER — Other Ambulatory Visit: Payer: Self-pay | Admitting: Physician Assistant

## 2016-07-09 DIAGNOSIS — I5042 Chronic combined systolic (congestive) and diastolic (congestive) heart failure: Secondary | ICD-10-CM

## 2016-07-24 ENCOUNTER — Other Ambulatory Visit: Payer: Self-pay | Admitting: Internal Medicine

## 2016-08-17 ENCOUNTER — Emergency Department (HOSPITAL_COMMUNITY)
Admission: EM | Admit: 2016-08-17 | Discharge: 2016-08-17 | Disposition: A | Discharge status: Home / Self Care | Payer: Medicare Other | Attending: Emergency Medicine | Admitting: Emergency Medicine

## 2016-08-17 ENCOUNTER — Emergency Department (HOSPITAL_COMMUNITY): Payer: Medicare Other

## 2016-08-17 ENCOUNTER — Encounter (HOSPITAL_COMMUNITY): Payer: Self-pay | Admitting: Emergency Medicine

## 2016-08-17 DIAGNOSIS — M25511 Pain in right shoulder: Secondary | ICD-10-CM

## 2016-08-17 DIAGNOSIS — I5022 Chronic systolic (congestive) heart failure: Secondary | ICD-10-CM | POA: Insufficient documentation

## 2016-08-17 DIAGNOSIS — Z7982 Long term (current) use of aspirin: Secondary | ICD-10-CM | POA: Insufficient documentation

## 2016-08-17 DIAGNOSIS — R079 Chest pain, unspecified: Secondary | ICD-10-CM | POA: Insufficient documentation

## 2016-08-17 DIAGNOSIS — E039 Hypothyroidism, unspecified: Secondary | ICD-10-CM | POA: Insufficient documentation

## 2016-08-17 DIAGNOSIS — Z8551 Personal history of malignant neoplasm of bladder: Secondary | ICD-10-CM | POA: Insufficient documentation

## 2016-08-17 LAB — BASIC METABOLIC PANEL
Anion gap: 12 (ref 5–15)
BUN: 13 mg/dL (ref 6–20)
CO2: 24 mmol/L (ref 22–32)
Calcium: 9.1 mg/dL (ref 8.9–10.3)
Chloride: 103 mmol/L (ref 101–111)
Creatinine, Ser: 0.87 mg/dL (ref 0.61–1.24)
GFR calc Af Amer: >60 mL/min (ref >60)
GFR calc non Af Amer: >60 mL/min (ref >60)
Glucose, Bld: 102 mg/dL — ABNORMAL HIGH (ref 65–99)
Potassium: 4.2 mmol/L (ref 3.5–5.1)
Sodium: 139 mmol/L (ref 135–145)

## 2016-08-17 LAB — CBC
HCT: 41.3 % (ref 39.0–52.0)
Hemoglobin: 14 g/dL (ref 13.0–17.0)
MCH: 31.9 pg (ref 26.0–34.0)
MCHC: 33.9 g/dL (ref 30.0–36.0)
MCV: 94.1 fL (ref 78.0–100.0)
Platelets: 202 10*3/uL (ref 150–400)
RBC: 4.39 MIL/uL (ref 4.22–5.81)
RDW: 14.1 % (ref 11.5–15.5)
WBC: 10.5 10*3/uL (ref 4.0–10.5)

## 2016-08-17 LAB — I-STAT TROPONIN, ED: Troponin i, poc: 0.03 ng/mL (ref 0.00–0.08)

## 2016-08-17 MED ORDER — MELOXICAM 7.5 MG PO TABS: 7.5000 mg | ORAL_TABLET | Freq: Every day | ORAL | 0 refills | Status: DC | PRN

## 2016-08-17 NOTE — ED Provider Notes (Signed)
[facility] DEPT Provider Note   CSN: [id number] Arrival date & time: 08/17/16  [id number]     History   Chief Complaint Chief Complaint  Patient presents with  . Chest Pain    HPI Lucas Fuller is a 76 y.o. male.  HPI   75yM with primarily R shoulder pain. Has had pain in R shoulder, R upper back, R upper chest and R upper arm for the past 3 days. He cannot remember what he was doing when he first noticed the pain. It is fairly constant. Sometimes worse with movement, although not consistently. No swelling. No rash. No numbness or tingling. of atrial flutter. 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 He denies any significant strain or overuse. He has not had significant problems with this shoulder previously. He has had a cough in the past week but says that this is improving. No dyspnea. No fevers or chills. No unusual swelling. No palpitations.   His past history is significant for atrial flutter. He says he has been doing well since ablation.  Not on NOAC. He is happy about not having to continue eliquis, but continues to take ASA. Chronic diastolic HF.   Past Medical History:  Diagnosis Date  . Atrial fibrillation, persistent ([place])   . Bladder cancer ([place])   . Chronic systolic CHF (congestive heart failure) ([place])   . History of blood transfusion   . Hypothyroidism   . Migraine    "years ago" (05/17/2016)  . Non-ischemic cardiomyopathy ([place])   . Thyroid disease     Patient Active Problem List   Diagnosis Date Noted  . Malnutrition of moderate degree 12/07/2015  . Acute systolic CHF (congestive heart failure) ([place])   . Cardiomyopathy ([place])   . SOB (shortness of breath) 12/05/2015  . Atrial flutter ([place]) 12/05/2015  . Edema 12/05/2015  . Hypothyroidism 12/05/2015  . ETOH abuse 12/05/2015  . Thrombocytopenia ([place]) 12/05/2015  . Weight loss 12/05/2015  . Lumbar disc disease 12/05/2015  . Protein calorie malnutrition ([place])  12/05/2015  . Heart failure ([place]) 12/05/2015  . Elevated brain natriuretic peptide (BNP) level   . Paroxysmal atrial fibrillation ([facility])   . Acute diastolic congestive heart failure [facility])     Past Surgical History:  Procedure Laterality Date  . CARDIAC CATHETERIZATION N/A 12/08/2015   Procedure: Left Heart Cath and Coronary Angiography;  Surgeon: Lorretta Harp, MD;  Location: [facility];  Service: Cardiovascular;  Laterality: N/A;  . CYSTOSCOPY WITH FULGERATION  2008   Archie Endo 11/23/2010  . ELECTROPHYSIOLOGIC STUDY N/A 05/17/2016   Procedure: A-Flutter Ablation;  Surgeon: Evans Lance, MD;  Location: [facility];  Service: Cardiovascular;  Laterality: N/A;  . TONSILLECTOMY    . TRANSURETHRAL RESECTION OF BLADDER  2008   Archie Endo 11/23/2010       Home Medications    Prior to Admission medications   Medication Sig Start Date End Date Taking? Authorizing Provider  aspirin EC 81 MG tablet Take 1 tablet (81 mg total) by mouth daily. 06/20/16   Evans Lance, MD  carvedilol (COREG) 6.25 MG tablet Take 1 tablet (6.25 mg total) by mouth 2 (two) times daily. 07/10/16   Evans Lance, MD  furosemide (LASIX) 40 MG tablet Take 40 mg by mouth daily.    Historical Provider, MD  furosemide (LASIX) 40 MG tablet TAKE 1 TABLET TWICE A DAY FOR 1 WEEK AND THEN 1  TABLET EVERY DAY 07/25/16   Evans Lance, MD  gabapentin (NEURONTIN) 100 MG capsule Take 200 mg by mouth 3 (three) times daily.    Historical Provider, MD  KLOR-CON M20 20 MEQ tablet TAKE 1 TABLET BY MOUTH EVERY DAY 05/25/16   Burnell Blanks, MD  levothyroxine (SYNTHROID, LEVOTHROID) 50 MCG tablet Take 50 mcg by mouth daily before breakfast.    Historical Provider, MD  losartan (COZAAR) 25 MG tablet Take 1 tablet (25 mg total) by mouth daily. 04/30/16 07/29/16  Evans Lance, MD  Multiple Vitamin (MULTIVITAMIN WITH MINERALS) TABS tablet Take 1 tablet by mouth daily. 12/09/15   Bonnielee Haff, MD    Family History Family  History  Problem Relation Age of Onset  . Heart attack Father 26  . Kidney disease Sister   . Hypertension Sister   . Lung cancer Sister   . CVA Sister   . Diabetes Sister     Social History Social History  Substance Use Topics  . Smoking status: Never Smoker  . Smokeless tobacco: Never Used  . Alcohol use Yes     Comment: 05/17/2016 "stopped drinking ~ 09/2015"     Allergies   Asa [aspirin]   Review of Systems Review of Systems  All systems reviewed and negative, other than as noted in HPI.   Physical Exam Updated Vital Signs BP 155/88 (BP Location: Right Arm)   Pulse 75   Temp 97.5 F (36.4 C) (Oral)   Resp 15   SpO2 100%   Physical Exam  Constitutional: He appears well-developed and well-nourished. No distress.  HENT:  Head: Normocephalic and atraumatic.  Eyes: Conjunctivae are normal. Right eye exhibits no discharge. Left eye exhibits no discharge.  Neck: Neck supple.  Cardiovascular: Normal rate, regular rhythm and normal heart sounds.  Exam reveals no gallop and no friction rub.   No murmur heard. Pulmonary/Chest: Effort normal and breath sounds normal. No respiratory distress.  Abdominal: Soft. He exhibits no distension. There is no tenderness.  Musculoskeletal: He exhibits no edema or tenderness.  Chest right upper extremity grossly normal in appearance and symmetric as compared to the left. Pain is not reproducible with palpation or range of motion. Scott good strength in bilateral upper extremities. Sensation is intact to light touch. Venous great radial pulses bilaterally.  Neurological: He is alert.  Skin: Skin is warm and dry.  Psychiatric: He has a normal mood and affect. His behavior is normal. Thought content normal.  Nursing note and vitals reviewed.    ED Treatments / Results  Labs (all labs ordered are listed, but only abnormal results are displayed) Labs Reviewed  BASIC METABOLIC PANEL - Abnormal; Notable for the following:        Result Value   Glucose, Bld 102 (*)    All other components within normal limits  CBC  I-STAT TROPOININ, ED    EKG  EKG Interpretation  Date/Time:  Friday August 17 2016 08:18:57 EST Ventricular Rate:  66 PR Interval:  188 QRS Duration: 98 QT Interval:  420 QTC Calculation: 440 R Axis:   -65 Text Interpretation:  Normal sinus rhythm Left anterior fascicular block Nonspecific T wave abnormality similar to previous tracing from 05/18/2016 Confirmed by Wilson Singer  MD, [place] [phone]) on 08/17/2016 9:22:29 AM       Radiology Dg Chest 2 View  Result Date: 08/17/2016 CLINICAL DATA:  Right side chest pain EXAM: CHEST  2 VIEW COMPARISON:  12/05/2015 FINDINGS: Cardiomediastinal silhouette is stable. No infiltrate  or pleural effusion. No pulmonary edema. Degenerative changes mid thoracic spine. IMPRESSION: No active cardiopulmonary disease. Electronically Signed   By: Lahoma Crocker M.D.   On: 08/17/2016 09:11    Procedures Procedures (including critical care time)  Medications Ordered in ED Medications - No data to display   Initial Impression / Assessment and Plan / ED Course  I have reviewed the triage vital signs and the nursing notes.  Pertinent labs & imaging results that were available during my care of the patient were reviewed by me and considered in my medical decision making (see chart for details).     76 year old male with right shoulder/right chest/right upper extremity pain. To me, this seems pretty atypical for ACS. Pain has been pretty constant for the past three day. He reports that it is sometimes worse with movement. I cannot reproduce on my exam today though. He is neurovascularly intact. He doesn't necessarily give me a strong history for cervical radiculopathy, but this is a possibility.   His ED workup today is pretty unremarkable. Assistant chest x-ray is without acute abnormality. His labs are fine. His EKG is abnormal, but it looks pretty similar to an EKG from this  past October. He is no longer on [employer]. Despite his age I think a few days then inset is reasonable. His renal function today is fine. Return precautions were discussed. Activity limited by his symptoms. Outpatient follow-up as needed otherwise.    Final Clinical Impressions(s) / ED Diagnoses   Final diagnoses:  Acute pain of right shoulder    New Prescriptions New Prescriptions   No medications on file     Virgel Manifold, MD 08/17/16 [phone]

## 2016-08-17 NOTE — ED Triage Notes (Signed)
Pt started having right sided chest pain that radiates into right arm. Pt has had some intermittent SOB as well. Denies nausea.

## 2016-08-23 ENCOUNTER — Other Ambulatory Visit: Payer: Self-pay | Admitting: Internal Medicine

## 2016-09-04 DIAGNOSIS — M79672 Pain in left foot: Secondary | ICD-10-CM | POA: Diagnosis not present

## 2016-09-04 DIAGNOSIS — L602 Onychogryphosis: Secondary | ICD-10-CM | POA: Diagnosis not present

## 2016-09-04 DIAGNOSIS — L84 Corns and callosities: Secondary | ICD-10-CM | POA: Diagnosis not present

## 2016-09-04 DIAGNOSIS — M205X1 Other deformities of toe(s) (acquired), right foot: Secondary | ICD-10-CM | POA: Diagnosis not present

## 2016-09-04 DIAGNOSIS — M205X2 Other deformities of toe(s) (acquired), left foot: Secondary | ICD-10-CM | POA: Diagnosis not present

## 2016-09-04 DIAGNOSIS — M21961 Unspecified acquired deformity of right lower leg: Secondary | ICD-10-CM | POA: Diagnosis not present

## 2016-09-05 ENCOUNTER — Other Ambulatory Visit: Payer: Self-pay | Admitting: *Deleted

## 2016-09-05 NOTE — Patient Outreach (Signed)
[facility]) Care Management  09/05/2016  OMARR SARMA [birth year] [id number]  RN Health Coach  attempted #1 Follow up outreach call to patient.  Patient was unavailable. HIPPA compliance voicemail message was left with return callback number.  Plan: RN will call patient again within 14 days.    [facility] Care Management [phone]

## 2016-09-17 ENCOUNTER — Other Ambulatory Visit: Payer: Self-pay | Admitting: *Deleted

## 2016-09-17 NOTE — Patient Outreach (Signed)
[facility]) Care Management  09/17/2016  Lucas Fuller 02/13/1941 [id number]   [name]  Attempted#2  Follow up outreach call to patient.  Patient was unavailable. HIPPA compliance voicemail message was left with return callback number.   Plan: RN will call patient again within 14 days.  [facility] Care Management [phone]

## 2016-09-18 DIAGNOSIS — M898X9 Other specified disorders of bone, unspecified site: Secondary | ICD-10-CM | POA: Diagnosis not present

## 2016-09-18 DIAGNOSIS — M205X1 Other deformities of toe(s) (acquired), right foot: Secondary | ICD-10-CM | POA: Diagnosis not present

## 2016-09-18 DIAGNOSIS — M21961 Unspecified acquired deformity of right lower leg: Secondary | ICD-10-CM | POA: Diagnosis not present

## 2016-09-18 DIAGNOSIS — M205X2 Other deformities of toe(s) (acquired), left foot: Secondary | ICD-10-CM | POA: Diagnosis not present

## 2016-09-19 DIAGNOSIS — Z01818 Encounter for other preprocedural examination: Secondary | ICD-10-CM | POA: Diagnosis not present

## 2016-09-27 ENCOUNTER — Other Ambulatory Visit: Payer: Self-pay | Admitting: *Deleted

## 2016-09-27 ENCOUNTER — Encounter: Payer: Self-pay | Admitting: *Deleted

## 2016-09-27 NOTE — Patient Outreach (Signed)
[facility]) Care Management  09/27/2016  Lucas Fuller [birth year] [id number]    RN Health Coach attempted #3 Follow up outreach call to patient.  Patient was unavailable. HIPPA compliance voicemail message was left with return callback number.   Plan: RN will send unsuccessful outreach letter. If patient hasn't responded will send closure letter within 10 business days.    [facility] Care Management [phone]

## 2016-10-01 DIAGNOSIS — M21621 Bunionette of right foot: Secondary | ICD-10-CM | POA: Diagnosis not present

## 2016-10-01 DIAGNOSIS — M205X1 Other deformities of toe(s) (acquired), right foot: Secondary | ICD-10-CM | POA: Diagnosis not present

## 2016-10-01 DIAGNOSIS — M898X9 Other specified disorders of bone, unspecified site: Secondary | ICD-10-CM | POA: Diagnosis not present

## 2016-10-01 DIAGNOSIS — M2041 Other hammer toe(s) (acquired), right foot: Secondary | ICD-10-CM | POA: Diagnosis not present

## 2016-10-01 DIAGNOSIS — M21611 Bunion of right foot: Secondary | ICD-10-CM | POA: Diagnosis not present

## 2016-10-01 MED FILL — OXYCODONE/APAP 5/325 MG TAB: 5-325 | 5 days supply | Qty: 30 | Fill #0

## 2016-10-05 DIAGNOSIS — M21611 Bunion of right foot: Secondary | ICD-10-CM | POA: Diagnosis not present

## 2016-10-11 ENCOUNTER — Encounter: Payer: Self-pay | Admitting: *Deleted

## 2016-10-11 ENCOUNTER — Ambulatory Visit: Payer: Self-pay | Admitting: *Deleted

## 2016-11-13 DIAGNOSIS — M21612 Bunion of left foot: Secondary | ICD-10-CM | POA: Diagnosis not present

## 2016-11-15 DIAGNOSIS — H9193 Unspecified hearing loss, bilateral: Secondary | ICD-10-CM | POA: Diagnosis not present

## 2016-11-15 DIAGNOSIS — H6122 Impacted cerumen, left ear: Secondary | ICD-10-CM | POA: Diagnosis not present

## 2016-11-29 DIAGNOSIS — M21612 Bunion of left foot: Secondary | ICD-10-CM | POA: Diagnosis not present

## 2016-12-27 ENCOUNTER — Other Ambulatory Visit: Payer: Self-pay | Admitting: *Deleted

## 2016-12-27 ENCOUNTER — Other Ambulatory Visit: Payer: Self-pay | Admitting: Internal Medicine

## 2017-01-01 DIAGNOSIS — L602 Onychogryphosis: Secondary | ICD-10-CM | POA: Diagnosis not present

## 2017-01-01 DIAGNOSIS — L84 Corns and callosities: Secondary | ICD-10-CM | POA: Diagnosis not present

## 2017-01-01 DIAGNOSIS — E1351 Other specified diabetes mellitus with diabetic peripheral angiopathy without gangrene: Secondary | ICD-10-CM | POA: Diagnosis not present

## 2017-01-10 ENCOUNTER — Encounter: Payer: Self-pay | Admitting: Internal Medicine

## 2017-01-28 ENCOUNTER — Ambulatory Visit (INDEPENDENT_AMBULATORY_CARE_PROVIDER_SITE_OTHER): Payer: Medicare Other | Admitting: Internal Medicine

## 2017-01-28 ENCOUNTER — Encounter: Payer: Self-pay | Admitting: Internal Medicine

## 2017-01-28 ENCOUNTER — Other Ambulatory Visit: Payer: Self-pay

## 2017-01-28 ENCOUNTER — Encounter (INDEPENDENT_AMBULATORY_CARE_PROVIDER_SITE_OTHER): Payer: Self-pay

## 2017-01-28 VITALS — BP 110/76 | HR 83 | Ht 67.5 in | Wt 196.2 lb

## 2017-01-28 DIAGNOSIS — I5042 Chronic combined systolic (congestive) and diastolic (congestive) heart failure: Secondary | ICD-10-CM

## 2017-01-28 DIAGNOSIS — I4892 Unspecified atrial flutter: Secondary | ICD-10-CM

## 2017-01-28 DIAGNOSIS — I428 Other cardiomyopathies: Secondary | ICD-10-CM

## 2017-01-28 DIAGNOSIS — I519 Heart disease, unspecified: Secondary | ICD-10-CM

## 2017-01-28 MED ORDER — FUROSEMIDE 40 MG PO TABS: 40.0000 mg | ORAL_TABLET | Freq: Every day | ORAL | 6 refills | Status: DC

## 2017-01-28 MED ORDER — LOSARTAN POTASSIUM 25 MG PO TABS: 25.0000 mg | ORAL_TABLET | Freq: Every day | ORAL | 8 refills | Status: DC

## 2017-01-28 MED ORDER — POTASSIUM CHLORIDE CRYS ER 20 MEQ PO TBCR: 20.0000 meq | EXTENDED_RELEASE_TABLET | Freq: Every day | ORAL | 11 refills | Status: DC

## 2017-01-28 MED ORDER — FUROSEMIDE 40 MG PO TABS: 40.0000 mg | ORAL_TABLET | Freq: Every day | ORAL | 11 refills | Status: DC

## 2017-01-28 MED ORDER — CARVEDILOL 6.25 MG PO TABS: 6.2500 mg | ORAL_TABLET | Freq: Two times a day (BID) | ORAL | 11 refills | Status: DC

## 2017-01-28 NOTE — Progress Notes (Signed)
Re ordered

## 2017-01-28 NOTE — Progress Notes (Signed)
HPI Lucas Fuller returns today for ongoing evaluation of LV dysfunction after atrial flutter ablation. The patient carries a diagnosis of both atrial fib and flutter. However when I looked into his ECG's he has only atrial flutter on his ECG's. He has undergone catheter ablation and appears to be maintaining NSR. He has had no recurrent symptomatic arrhythmias. He denies chest pain or sob. No palpitations. His dyspnea has improved. No syncope. No edema. Allergies  Allergen Reactions  . Asa [Aspirin] Hives    Pt can tolerate 81 mg with no issues     Current Outpatient Prescriptions  Medication Sig Dispense Refill  . aspirin EC 81 MG tablet Take 1 tablet (81 mg total) by mouth daily. 30 tablet 6  . carvedilol (COREG) 6.25 MG tablet Take 1 tablet (6.25 mg total) by mouth 2 (two) times daily. 60 tablet 11  . furosemide (LASIX) 40 MG tablet Take 1 tablet (40 mg total) by mouth daily. 30 tablet 6  . gabapentin (NEURONTIN) 100 MG capsule Take 200 mg by mouth 3 (three) times daily.    Marland Kitchen KLOR-CON M20 20 MEQ tablet TAKE 1 TABLET BY MOUTH EVERY DAY 30 tablet 11  . levothyroxine (SYNTHROID, LEVOTHROID) 50 MCG tablet Take 50 mcg by mouth daily before breakfast.    . losartan (COZAAR) 25 MG tablet TAKE 1 TABLET (25 MG TOTAL) BY MOUTH DAILY. 30 tablet 8  . meloxicam (MOBIC) 7.5 MG tablet Take 1 tablet (7.5 mg total) by mouth daily as needed for pain. 5 tablet 0  . Multiple Vitamin (MULTIVITAMIN WITH MINERALS) TABS tablet Take 1 tablet by mouth daily. 30 tablet 0   No current facility-administered medications for this visit.      Past Medical History:  Diagnosis Date  . Atrial fibrillation, persistent ([place])   . Bladder cancer ([place])   . Chronic systolic CHF (congestive heart failure) ([place])   . History of blood transfusion   . Hypothyroidism   . Migraine    "years ago" (05/17/2016)  . Non-ischemic cardiomyopathy ([place])   . Thyroid disease     ROS:   All systems reviewed and negative  except as noted in the HPI.   Past Surgical History:  Procedure Laterality Date  . CARDIAC CATHETERIZATION N/A 12/08/2015   Procedure: Left Heart Cath and Coronary Angiography;  Surgeon: Lorretta Harp, MD;  Location: [facility];  Service: Cardiovascular;  Laterality: N/A;  . CYSTOSCOPY WITH FULGERATION  2008   Archie Endo 11/23/2010  . ELECTROPHYSIOLOGIC STUDY N/A 05/17/2016   Procedure: A-Flutter Ablation;  Surgeon: Evans Lance, MD;  Location: [facility];  Service: Cardiovascular;  Laterality: N/A;  . TONSILLECTOMY    . TRANSURETHRAL RESECTION OF BLADDER  2008   Archie Endo 11/23/2010     Family History  Problem Relation Age of Onset  . Heart attack Father 69  . Kidney disease Sister   . Hypertension Sister   . Lung cancer Sister   . CVA Sister   . Diabetes Sister      Social History   Social History  . Marital status: Divorced    Spouse name: N/A  . Number of children: N/A  . Years of education: N/A   Occupational History  . Not on file.   Social History Main Topics  . Smoking status: Never Smoker  . Smokeless tobacco: Never Used  . Alcohol use Yes     Comment: 05/17/2016 "stopped drinking ~ 09/2015"  . Drug use: No  .  Sexual activity: Not Currently   Other Topics Concern  . Not on file   Social History Narrative  . No narrative on file     BP 110/76   Pulse 83   Ht 5' 7.5" (1.715 m)   Wt 196 lb 3.2 oz (89 kg)   BMI 30.28 kg/m   Physical Exam:  Well appearing 76 yo man, NAD HEENT: Unremarkable Neck:  6 cm JVD, no thyromegally Lymphatics:  No adenopathy Back:  No CVA tenderness Lungs:  Clear with no wheezes HEART:  Regular rate rhythm, no murmurs, no rubs, no clicks Abd:  soft, positive bowel sounds, no organomegally, no rebound, no guarding Ext:  2 plus pulses, no edema, no cyanosis, no clubbing Skin:  No rashes no nodules Neuro:  CN II through XII intact, motor grossly intact  EKG - nsr with nonspecific T wave  abnormality.   Assess/Plan: 1. Atrial flutter - he is doing well after catheter ablation. He has previously stopped his eliquis and will continue low dose ASA. 2. Chronic systolic heart failure - he is class 2. He may have a reversible cause. I have suggested he continue his current meds. Would consider a repeat 2D echo. If his EF is improved, then no ICD. 3. ETOH abuse - he denies active ETOH abuse.  Lucas Fuller.D.

## 2017-01-28 NOTE — Patient Instructions (Signed)
Medication Instructions:  Your physician recommends that you continue on your current medications as directed. Please refer to the Current Medication list given to you today.   Labwork: None ordered.   Testing/Procedures: Your physician has requested that you have an echocardiogram. Echocardiography is a painless test that uses sound waves to create images of your heart. It provides your doctor with information about the size and shape of your heart and how well your heart's chambers and valves are working. This procedure takes approximately one hour. There are no restrictions for this procedure.  Echocardiogram An echocardiogram, or echocardiography, uses sound waves (ultrasound) to produce an image of your heart. The echocardiogram is simple, painless, obtained within a short period of time, and offers valuable information to your health care provider. The images from an echocardiogram can provide information such as:  Evidence of coronary artery disease (CAD).  Heart size.  Heart muscle function.  Heart valve function.  Aneurysm detection.  Evidence of a past heart attack.  Fluid buildup around the heart.  Heart muscle thickening.  Assess heart valve function.  Tell a health care provider about:  Any allergies you have.  All medicines you are taking, including vitamins, herbs, eye drops, creams, and over-the-counter medicines.  Any problems you or family members have had with anesthetic medicines.  Any blood disorders you have.  Any surgeries you have had.  Any medical conditions you have.  Whether you are pregnant or may be pregnant. What happens before the procedure? No special preparation is needed. Eat and drink normally. What happens during the procedure?  In order to produce an image of your heart, gel will be applied to your chest and a wand-like tool (transducer) will be moved over your chest. The gel will help transmit the sound waves from the  transducer. The sound waves will harmlessly bounce off your heart to allow the heart images to be captured in real-time motion. These images will then be recorded.  You may need an IV to receive a medicine that improves the quality of the pictures. What happens after the procedure? You may return to your normal schedule including diet, activities, and medicines, unless your health care provider tells you otherwise. This information is not intended to replace advice given to you by your health care provider. Make sure you discuss any questions you have with your health care provider. Document Released: 07/06/2000 Document Revised: 02/25/2016 Document Reviewed: 03/16/2013 Elsevier Interactive Patient Education  2017 [place]: Your physician wants you to follow-up depending on results of ECHO.  Any Other Special Instructions Will Be Listed Below (If Applicable).     If you need a refill on your cardiac medications before your next appointment, please call your pharmacy.

## 2017-02-06 ENCOUNTER — Other Ambulatory Visit (HOSPITAL_COMMUNITY): Payer: Medicare Other

## 2017-02-14 ENCOUNTER — Ambulatory Visit (HOSPITAL_COMMUNITY): Payer: Medicare Other | Attending: Cardiology

## 2017-02-14 ENCOUNTER — Other Ambulatory Visit: Payer: Self-pay

## 2017-02-14 DIAGNOSIS — I351 Nonrheumatic aortic (valve) insufficiency: Secondary | ICD-10-CM | POA: Insufficient documentation

## 2017-02-14 DIAGNOSIS — I4892 Unspecified atrial flutter: Secondary | ICD-10-CM | POA: Insufficient documentation

## 2017-02-14 DIAGNOSIS — I428 Other cardiomyopathies: Secondary | ICD-10-CM | POA: Insufficient documentation

## 2017-02-14 DIAGNOSIS — I519 Heart disease, unspecified: Secondary | ICD-10-CM | POA: Diagnosis not present

## 2017-03-18 DIAGNOSIS — N400 Enlarged prostate without lower urinary tract symptoms: Secondary | ICD-10-CM | POA: Diagnosis not present

## 2017-03-18 DIAGNOSIS — Z8551 Personal history of malignant neoplasm of bladder: Secondary | ICD-10-CM | POA: Diagnosis not present

## 2017-04-09 DIAGNOSIS — L84 Corns and callosities: Secondary | ICD-10-CM | POA: Diagnosis not present

## 2017-04-09 DIAGNOSIS — L602 Onychogryphosis: Secondary | ICD-10-CM | POA: Diagnosis not present

## 2017-04-30 DIAGNOSIS — R5383 Other fatigue: Secondary | ICD-10-CM | POA: Diagnosis not present

## 2017-04-30 DIAGNOSIS — E039 Hypothyroidism, unspecified: Secondary | ICD-10-CM | POA: Diagnosis not present

## 2017-04-30 DIAGNOSIS — I5022 Chronic systolic (congestive) heart failure: Secondary | ICD-10-CM | POA: Diagnosis not present

## 2017-04-30 DIAGNOSIS — E782 Mixed hyperlipidemia: Secondary | ICD-10-CM | POA: Diagnosis not present

## 2017-06-11 DIAGNOSIS — E782 Mixed hyperlipidemia: Secondary | ICD-10-CM | POA: Diagnosis not present

## 2017-06-11 DIAGNOSIS — Z0000 Encounter for general adult medical examination without abnormal findings: Secondary | ICD-10-CM | POA: Diagnosis not present

## 2017-06-11 DIAGNOSIS — E039 Hypothyroidism, unspecified: Secondary | ICD-10-CM | POA: Diagnosis not present

## 2017-06-11 DIAGNOSIS — I10 Essential (primary) hypertension: Secondary | ICD-10-CM | POA: Diagnosis not present

## 2017-06-11 DIAGNOSIS — I5022 Chronic systolic (congestive) heart failure: Secondary | ICD-10-CM | POA: Diagnosis not present

## 2017-06-20 DIAGNOSIS — I428 Other cardiomyopathies: Secondary | ICD-10-CM | POA: Diagnosis not present

## 2017-06-20 DIAGNOSIS — E782 Mixed hyperlipidemia: Secondary | ICD-10-CM | POA: Diagnosis not present

## 2017-06-20 DIAGNOSIS — I10 Essential (primary) hypertension: Secondary | ICD-10-CM | POA: Diagnosis not present

## 2017-06-20 DIAGNOSIS — I5022 Chronic systolic (congestive) heart failure: Secondary | ICD-10-CM | POA: Diagnosis not present

## 2017-06-20 DIAGNOSIS — D696 Thrombocytopenia, unspecified: Secondary | ICD-10-CM | POA: Diagnosis not present

## 2017-07-09 DIAGNOSIS — M2042 Other hammer toe(s) (acquired), left foot: Secondary | ICD-10-CM | POA: Diagnosis not present

## 2017-07-09 DIAGNOSIS — L84 Corns and callosities: Secondary | ICD-10-CM | POA: Diagnosis not present

## 2017-07-09 DIAGNOSIS — M21612 Bunion of left foot: Secondary | ICD-10-CM | POA: Diagnosis not present

## 2017-07-09 DIAGNOSIS — M79671 Pain in right foot: Secondary | ICD-10-CM | POA: Diagnosis not present

## 2017-07-09 DIAGNOSIS — M79672 Pain in left foot: Secondary | ICD-10-CM | POA: Diagnosis not present

## 2017-09-03 DIAGNOSIS — M2042 Other hammer toe(s) (acquired), left foot: Secondary | ICD-10-CM | POA: Diagnosis not present

## 2017-09-03 DIAGNOSIS — M21612 Bunion of left foot: Secondary | ICD-10-CM | POA: Diagnosis not present

## 2017-09-03 DIAGNOSIS — M79672 Pain in left foot: Secondary | ICD-10-CM | POA: Diagnosis not present

## 2017-09-11 DIAGNOSIS — M79672 Pain in left foot: Secondary | ICD-10-CM | POA: Diagnosis not present

## 2017-09-11 DIAGNOSIS — M21612 Bunion of left foot: Secondary | ICD-10-CM | POA: Diagnosis not present

## 2017-09-11 DIAGNOSIS — M898X9 Other specified disorders of bone, unspecified site: Secondary | ICD-10-CM | POA: Diagnosis not present

## 2017-09-11 DIAGNOSIS — M205X2 Other deformities of toe(s) (acquired), left foot: Secondary | ICD-10-CM | POA: Diagnosis not present

## 2017-09-12 DIAGNOSIS — Z01818 Encounter for other preprocedural examination: Secondary | ICD-10-CM | POA: Diagnosis not present

## 2017-09-12 DIAGNOSIS — I10 Essential (primary) hypertension: Secondary | ICD-10-CM | POA: Diagnosis not present

## 2017-09-16 DIAGNOSIS — M2042 Other hammer toe(s) (acquired), left foot: Secondary | ICD-10-CM | POA: Diagnosis not present

## 2017-09-16 DIAGNOSIS — M21612 Bunion of left foot: Secondary | ICD-10-CM | POA: Diagnosis not present

## 2017-09-16 DIAGNOSIS — M2012 Hallux valgus (acquired), left foot: Secondary | ICD-10-CM | POA: Diagnosis not present

## 2017-09-16 DIAGNOSIS — M205X2 Other deformities of toe(s) (acquired), left foot: Secondary | ICD-10-CM | POA: Diagnosis not present

## 2017-09-16 DIAGNOSIS — M24575 Contracture, left foot: Secondary | ICD-10-CM | POA: Diagnosis not present

## 2017-09-16 MED FILL — OXYCODONE-ACETAMINOPHEN 5-3: 5-325 | 5 days supply | Qty: 30 | Fill #0

## 2017-09-20 DIAGNOSIS — M21612 Bunion of left foot: Secondary | ICD-10-CM | POA: Diagnosis not present

## 2017-10-09 DIAGNOSIS — H40023 Open angle with borderline findings, high risk, bilateral: Secondary | ICD-10-CM | POA: Diagnosis not present

## 2017-10-09 DIAGNOSIS — H2513 Age-related nuclear cataract, bilateral: Secondary | ICD-10-CM | POA: Diagnosis not present

## 2017-10-09 DIAGNOSIS — H35033 Hypertensive retinopathy, bilateral: Secondary | ICD-10-CM | POA: Diagnosis not present

## 2017-10-09 DIAGNOSIS — H25013 Cortical age-related cataract, bilateral: Secondary | ICD-10-CM | POA: Diagnosis not present

## 2017-10-15 DIAGNOSIS — M21612 Bunion of left foot: Secondary | ICD-10-CM | POA: Diagnosis not present

## 2017-10-29 DIAGNOSIS — M21612 Bunion of left foot: Secondary | ICD-10-CM | POA: Diagnosis not present

## 2017-11-08 DIAGNOSIS — M21612 Bunion of left foot: Secondary | ICD-10-CM | POA: Diagnosis not present

## 2017-12-11 ENCOUNTER — Other Ambulatory Visit: Payer: Self-pay

## 2017-12-11 NOTE — Patient Outreach (Signed)
[facility]) Care Management  12/11/2017  Lucas Fuller 1941/01/10 [id number]   Medication Adherence call to [name] patient did not answer, call [facility] they said patient pick up Losartan 25 mg on 12/09/17 for a 30 days supply patient is showing past due under [facility] Ins.for Medication Adherence.  [place] Management Direct Dial [phone]  Fax [phone] [email or web address]

## 2017-12-19 DIAGNOSIS — L602 Onychogryphosis: Secondary | ICD-10-CM | POA: Diagnosis not present

## 2017-12-19 DIAGNOSIS — L84 Corns and callosities: Secondary | ICD-10-CM | POA: Diagnosis not present

## 2018-02-15 ENCOUNTER — Other Ambulatory Visit: Payer: Self-pay | Admitting: Internal Medicine

## 2018-02-17 ENCOUNTER — Other Ambulatory Visit: Payer: Self-pay

## 2018-02-17 NOTE — Telephone Encounter (Signed)
REFILL DENIED. PT RECEIVED A 30 DAY SUPPLY ON 02/17/2018 WITH 8 REFILLS.

## 2018-03-20 DIAGNOSIS — L84 Corns and callosities: Secondary | ICD-10-CM | POA: Diagnosis not present

## 2018-03-20 DIAGNOSIS — B351 Tinea unguium: Secondary | ICD-10-CM | POA: Diagnosis not present

## 2018-04-03 DIAGNOSIS — H2513 Age-related nuclear cataract, bilateral: Secondary | ICD-10-CM | POA: Diagnosis not present

## 2018-04-03 DIAGNOSIS — H35033 Hypertensive retinopathy, bilateral: Secondary | ICD-10-CM | POA: Diagnosis not present

## 2018-04-03 DIAGNOSIS — H25013 Cortical age-related cataract, bilateral: Secondary | ICD-10-CM | POA: Diagnosis not present

## 2018-04-03 DIAGNOSIS — H25043 Posterior subcapsular polar age-related cataract, bilateral: Secondary | ICD-10-CM | POA: Diagnosis not present

## 2018-04-08 DIAGNOSIS — L03032 Cellulitis of left toe: Secondary | ICD-10-CM | POA: Diagnosis not present

## 2018-04-17 DIAGNOSIS — H2512 Age-related nuclear cataract, left eye: Secondary | ICD-10-CM | POA: Diagnosis not present

## 2018-04-17 DIAGNOSIS — H25043 Posterior subcapsular polar age-related cataract, bilateral: Secondary | ICD-10-CM | POA: Diagnosis not present

## 2018-04-17 DIAGNOSIS — H25013 Cortical age-related cataract, bilateral: Secondary | ICD-10-CM | POA: Diagnosis not present

## 2018-04-17 DIAGNOSIS — H2513 Age-related nuclear cataract, bilateral: Secondary | ICD-10-CM | POA: Diagnosis not present

## 2018-04-17 DIAGNOSIS — H5703 Miosis: Secondary | ICD-10-CM | POA: Diagnosis not present

## 2018-04-23 DIAGNOSIS — L03032 Cellulitis of left toe: Secondary | ICD-10-CM | POA: Diagnosis not present

## 2018-05-14 DIAGNOSIS — H2512 Age-related nuclear cataract, left eye: Secondary | ICD-10-CM | POA: Diagnosis not present

## 2018-05-14 DIAGNOSIS — H25812 Combined forms of age-related cataract, left eye: Secondary | ICD-10-CM | POA: Diagnosis not present

## 2018-05-20 DIAGNOSIS — H2511 Age-related nuclear cataract, right eye: Secondary | ICD-10-CM | POA: Diagnosis not present

## 2018-05-20 DIAGNOSIS — H25011 Cortical age-related cataract, right eye: Secondary | ICD-10-CM | POA: Diagnosis not present

## 2018-05-20 DIAGNOSIS — H25041 Posterior subcapsular polar age-related cataract, right eye: Secondary | ICD-10-CM | POA: Diagnosis not present

## 2018-05-28 DIAGNOSIS — H2521 Age-related cataract, morgagnian type, right eye: Secondary | ICD-10-CM | POA: Diagnosis not present

## 2018-05-28 DIAGNOSIS — H25811 Combined forms of age-related cataract, right eye: Secondary | ICD-10-CM | POA: Diagnosis not present

## 2018-05-30 DIAGNOSIS — I4892 Unspecified atrial flutter: Secondary | ICD-10-CM | POA: Diagnosis not present

## 2018-05-30 DIAGNOSIS — I5022 Chronic systolic (congestive) heart failure: Secondary | ICD-10-CM | POA: Diagnosis not present

## 2018-05-30 DIAGNOSIS — I10 Essential (primary) hypertension: Secondary | ICD-10-CM | POA: Diagnosis not present

## 2018-05-30 DIAGNOSIS — I428 Other cardiomyopathies: Secondary | ICD-10-CM | POA: Diagnosis not present

## 2018-05-30 DIAGNOSIS — R9431 Abnormal electrocardiogram [ECG] [EKG]: Secondary | ICD-10-CM | POA: Diagnosis not present

## 2018-06-23 NOTE — Progress Notes (Signed)
Cardiology Office Note    Date:  06/24/2018   ID:  DANNELL GORTNEY, DOB 17-Aug-1940, MRN [id number]  PCP:  Merrilee Seashore, MD  Cardiologist: [name], MD EPS: None  Chief Complaint  Patient presents with  . Hypertension    History of Present Illness:  Lucas Fuller is a 77 y.o. male with history of atrial flutter ablation, nonischemic cardiomyopathy with LV dysfunction EF previously 20 to 25%.  Normal coronary arteries on cath in 2017.  Patient last saw Dr. Lovena Le 01/28/2017 at which time the patient stopped his Eliquis and was continued on low-dose aspirin.  Repeat 2D echo 02/14/2017 LVEF improved to 40 to 45% with mild diffuse hypokinesis and grade 1 DD.  Patient added on to my schedule as a referral from Dr. Ashby Dawes after patient ran out of BP meds for 6 months.  They restarted lisinopril 2.5 mg daily and carvedilol 6.25 mg twice daily.  Blood pressures are still running high.  He did not take his medications before he came in today.  He also takes Lasix as needed for swelling but does not have much of this left.  He has chronic dyspnea on exertion and on occasion swelling.  He started watching his salt when he was told his blood pressure was high.  He works as a Biomedical scientist at [employer].  Denies chest pain, palpitations, dizziness or presyncope.  Drinks 2 airplane bottles of alcohol daily.    Past Medical History:  Diagnosis Date  . Atrial fibrillation, persistent   . Bladder cancer ([place])   . Chronic systolic CHF (congestive heart failure) ([place])   . History of blood transfusion   . Hypothyroidism   . Migraine    "years ago" (05/17/2016)  . Non-ischemic cardiomyopathy ([place])   . Thyroid disease     Past Surgical History:  Procedure Laterality Date  . CARDIAC CATHETERIZATION N/A 12/08/2015   Procedure: Left Heart Cath and Coronary Angiography;  Surgeon: Lorretta Harp, MD;  Location: [facility];  Service: Cardiovascular;  Laterality: N/A;  . CYSTOSCOPY  WITH FULGERATION  2008   Archie Endo 11/23/2010  . ELECTROPHYSIOLOGIC STUDY N/A 05/17/2016   Procedure: A-Flutter Ablation;  Surgeon: Evans Lance, MD;  Location: [facility];  Service: Cardiovascular;  Laterality: N/A;  . TONSILLECTOMY    . TRANSURETHRAL RESECTION OF BLADDER  2008   Archie Endo 11/23/2010    Current Medications: Current Meds  Medication Sig  . aspirin EC 81 MG tablet Take 1 tablet (81 mg total) by mouth daily.  . carvedilol (COREG) 6.25 MG tablet Take 1 tablet (6.25 mg total) by mouth 2 (two) times daily.  Marland Kitchen levothyroxine (SYNTHROID, LEVOTHROID) 50 MCG tablet Take 50 mcg by mouth daily before breakfast.  . [DISCONTINUED] carvedilol (COREG) 6.25 MG tablet Take 1 tablet (6.25 mg total) by mouth 2 (two) times daily.  . [DISCONTINUED] lisinopril (PRINIVIL,ZESTRIL) 2.5 MG tablet Take 2.5 mg by mouth daily.     Allergies:   Asa [aspirin]   Social History   Socioeconomic History  . Marital status: Divorced    Spouse name: Not on file  . Number of children: Not on file  . Years of education: Not on file  . Highest education level: Not on file  Occupational History  . Not on file  Social Needs  . Financial resource strain: Not on file  . Food insecurity:    Worry: Not on file    Inability: Not on file  . Transportation needs:  Medical: Not on file    Non-medical: Not on file  Tobacco Use  . Smoking status: Never Smoker  . Smokeless tobacco: Never Used  Substance and Sexual Activity  . Alcohol use: Yes    Comment: 05/17/2016 "stopped drinking ~ 09/2015"  . Drug use: No  . Sexual activity: Not Currently  Lifestyle  . Physical activity:    Days per week: Not on file    Minutes per session: Not on file  . Stress: Not on file  Relationships  . Social connections:    Talks on phone: Not on file    Gets together: Not on file    Attends religious service: Not on file    Active member of club or organization: Not on file    Attends meetings of clubs or  organizations: Not on file    Relationship status: Not on file  Other Topics Concern  . Not on file  Social History Narrative  . Not on file     Family History:  The patient's family history includes CVA in his sister; Diabetes in his sister; Heart attack (age of onset: 42) in his father; Hypertension in his sister; Kidney disease in his sister; Lung cancer in his sister.   ROS:   Please see the history of present illness.    Review of Systems  Constitution: Negative.  HENT: Negative.   Cardiovascular: Positive for dyspnea on exertion and leg swelling.  Respiratory: Negative.   Endocrine: Negative.   Hematologic/Lymphatic: Negative.   Musculoskeletal: Negative.   Gastrointestinal: Negative.   Genitourinary: Negative.   Neurological: Negative.    All other systems reviewed and are negative.   PHYSICAL EXAM:   VS:  BP (!) 160/100   Pulse 80   Ht 5' 7.5" (1.715 m)   Wt 178 lb 12.8 oz (81.1 kg)   SpO2 98%   BMI 27.59 kg/m   Physical Exam  GEN: Well nourished, well developed, in no acute distress  Neck: no JVD, carotid bruits, or masses Cardiac:RRR; positive S4 no murmurs, rubs Respiratory:  clear to auscultation bilaterally, normal work of breathing GI: soft, nontender, nondistended, + BS Ext: without cyanosis, clubbing, or edema, Good distal pulses bilaterally Neuro:  Alert and Oriented x 3 Psych: euthymic mood, full affect  Wt Readings from Last 3 Encounters:  06/24/18 178 lb 12.8 oz (81.1 kg)  01/28/17 196 lb 3.2 oz (89 kg)  06/20/16 176 lb (79.8 kg)      Studies/Labs Reviewed:   EKG:  EKG is  ordered today.  The ekg ordered today demonstrates normal sinus rhythm with first-degree AV block left anterior fascicular block similar to EKG 01/2017  Recent Labs: No results found for requested labs within last 8760 hours.   Lipid Panel    Component Value Date/Time   CHOL  06/24/2009 0540    155        ATP III CLASSIFICATION:  <200     mg/dL   Desirable  200-239   mg/dL   Borderline High  >=240    mg/dL   High          TRIG 60 06/24/2009 0540   HDL 34 (L) 06/24/2009 0540   CHOLHDL 4.6 06/24/2009 0540   VLDL 12 06/24/2009 0540   LDLCALC (H) 06/24/2009 0540    109        Total Cholesterol/HDL:CHD Risk Coronary Heart Disease Risk Table  Men   Women  1/2 Average Risk   3.4   3.3  Average Risk       5.0   4.4  2 X Average Risk   9.6   7.1  3 X Average Risk  23.4   11.0        Use the calculated Patient Ratio above and the CHD Risk Table to determine the patient's CHD Risk.        ATP III CLASSIFICATION (LDL):  <100     mg/dL   Optimal  100-129  mg/dL   Near or Above                    Optimal  130-159  mg/dL   Borderline  160-189  mg/dL   High  >190     mg/dL   Very High    Additional studies/ records that were reviewed today include:  Cardiac catheterization 2017IMPRESSION:Mr. Lovena Le has a left dominant system, clean coronary arteries and a dilated severely hypocontractile LV consistent with nonischemic cardiomyopathy. His groin was sealed with a "MYNX " device. He will continue medical therapy. He left the lab in stable condition.   Quay Burow. MD, [facility] 12/08/2015 4:15 PM    2D echo 7/2018Study Conclusions   - Left ventricle: The cavity size was normal. There was mild   concentric hypertrophy. Systolic function was mildly to   moderately reduced. The estimated ejection fraction was in the   range of 40% to 45%. Mild diffuse hypokinesis. There was an   increased relative contribution of atrial contraction to   ventricular filling. Doppler parameters are consistent with   abnormal left ventricular relaxation (grade 1 diastolic   dysfunction). - Aortic valve: Moderate focal calcification involving the   noncoronary cusp. There was mild regurgitation. - Mitral valve: Calcified annulus. - Left atrium: The atrium was moderately dilated. - Atrial septum: The septum bowed from left to right, consistent   with  increased left atrial pressure. - Pulmonary arteries: Systolic pressure could not be accurately   estimated.      ASSESSMENT:    1. Atrial flutter, unspecified type ([place])   2. Dilated cardiomyopathy ([place])   3. ETOH abuse   4. Chronic combined systolic and diastolic CHF (congestive heart failure) ([place])   5. Essential hypertension      PLAN:  In order of problems listed above:  Essential hypertension patient's blood pressures been running high since he ran out of his medications at least 6 months ago.  Still high today.  Will stop lisinopril and restart losartan 25 mg once daily as he was on previously.  Obtain blood work from PCP that was done in the past month.  He is also scheduled for blood work 07/21/2018.  Continue carvedilol 6.25 mg twice daily.  Refill Lasix 40 mg and potassium 20 mEq to take as needed for edema or weight gain of 2 or 3 pounds overnight.  2 g sodium diet.  Patient asked to call us if his blood pressures stay above 135/85.  I will see him back in 2 weeks for blood pressure check.  History of atrial flutter status post ablation in normal sinus rhythm  Dilated cardiomyopathy ejection fraction previously 20 to 25% improved to 40 to 45% 01/2017.  Normal cardiac catheterization 2017.  Update 2D echo since he has been off his medications.  History of alcohol abuse drinks 2 airplane bottles daily.    Medication Adjustments/Labs and Tests Ordered: Current medicines are  reviewed at length with the patient today.  Concerns regarding medicines are outlined above.  Medication changes, Labs and Tests ordered today are listed in the Patient Instructions below. There are no Patient Instructions on file for this visit.   Sumner Boast, PA-C  06/24/2018 11:07 AM    [facility] [address] [place], [place], [place]  [address] Phone: [phone]; Fax: [phone]

## 2018-06-24 ENCOUNTER — Encounter: Payer: Self-pay | Admitting: Physician Assistant

## 2018-06-24 ENCOUNTER — Ambulatory Visit: Payer: Medicare Other | Admitting: Physician Assistant

## 2018-06-24 ENCOUNTER — Encounter (INDEPENDENT_AMBULATORY_CARE_PROVIDER_SITE_OTHER): Payer: Self-pay

## 2018-06-24 VITALS — BP 160/100 | HR 80 | Ht 67.5 in | Wt 178.8 lb

## 2018-06-24 DIAGNOSIS — I4892 Unspecified atrial flutter: Secondary | ICD-10-CM | POA: Diagnosis not present

## 2018-06-24 DIAGNOSIS — I5042 Chronic combined systolic (congestive) and diastolic (congestive) heart failure: Secondary | ICD-10-CM | POA: Diagnosis not present

## 2018-06-24 DIAGNOSIS — F1010 Alcohol abuse, uncomplicated: Secondary | ICD-10-CM

## 2018-06-24 DIAGNOSIS — I420 Dilated cardiomyopathy: Secondary | ICD-10-CM | POA: Diagnosis not present

## 2018-06-24 DIAGNOSIS — I10 Essential (primary) hypertension: Secondary | ICD-10-CM

## 2018-06-24 HISTORY — DX: Essential (primary) hypertension: I10

## 2018-06-24 MED ORDER — CARVEDILOL 6.25 MG PO TABS
6.2500 mg | ORAL_TABLET | Freq: Two times a day (BID) | ORAL | 3 refills | Status: DC
Start: 2018-06-24 — End: 2019-07-30

## 2018-06-24 MED ORDER — FUROSEMIDE 40 MG PO TABS: 40.0000 mg | ORAL_TABLET | Freq: Every day | ORAL | 11 refills | Status: DC | PRN

## 2018-06-24 MED ORDER — POTASSIUM CHLORIDE CRYS ER 20 MEQ PO TBCR: 20.0000 meq | EXTENDED_RELEASE_TABLET | Freq: Every day | ORAL | 11 refills | Status: DC | PRN

## 2018-06-24 MED ORDER — LOSARTAN POTASSIUM 25 MG PO TABS: 25.0000 mg | ORAL_TABLET | Freq: Every day | ORAL | 3 refills | Status: DC

## 2018-06-24 NOTE — Patient Instructions (Signed)
Medication Instructions:  Your physician has recommended you make the following change in your medication:   1. STOP: lisinopril  2. CONTINUE: carvedilol 6.25 mg twice a day  3. START: losartan 25 mg once a day  4. TAKE: furosemide (lasix) 40 mg once daily AS NEEDED for swelling  5. TAKE: potassium 20 mEq once daily AS NEEDED with lasix only  If you need a refill on your cardiac medications before your next appointment, please call your pharmacy.   Lab work: None ordered  If you have labs (blood work) drawn today and your tests are completely normal, you will receive your results only by: Marland Kitchen MyChart Message (if you have MyChart) OR . A paper copy in the mail If you have any lab test that is abnormal or we need to change your treatment, we will call you to review the results.  Testing/Procedures: Your physician has requested that you have an echocardiogram. Echocardiography is a painless test that uses sound waves to create images of your heart. It provides your doctor with information about the size and shape of your heart and how well your heart's chambers and valves are working. This procedure takes approximately one hour. There are no restrictions for this procedure.  Follow-Up: . Follow up with Ermalinda Barrios in 2 weeks  Any Other Special Instructions Will Be Listed Below (If Applicable). Echocardiogram An echocardiogram, or echocardiography, uses sound waves (ultrasound) to produce an image of your heart. The echocardiogram is simple, painless, obtained within a short period of time, and offers valuable information to your health care provider. The images from an echocardiogram can provide information such as:  Evidence of coronary artery disease (CAD).  Heart size.  Heart muscle function.  Heart valve function.  Aneurysm detection.  Evidence of a past heart attack.  Fluid buildup around the heart.  Heart muscle thickening.  Assess heart valve function.  Tell a  health care provider about:  Any allergies you have.  All medicines you are taking, including vitamins, herbs, eye drops, creams, and over-the-counter medicines.  Any problems you or family members have had with anesthetic medicines.  Any blood disorders you have.  Any surgeries you have had.  Any medical conditions you have.  Whether you are pregnant or may be pregnant. What happens before the procedure? No special preparation is needed. Eat and drink normally. What happens during the procedure?  In order to produce an image of your heart, gel will be applied to your chest and a wand-like tool (transducer) will be moved over your chest. The gel will help transmit the sound waves from the transducer. The sound waves will harmlessly bounce off your heart to allow the heart images to be captured in real-time motion. These images will then be recorded.  You may need an IV to receive a medicine that improves the quality of the pictures. What happens after the procedure? You may return to your normal schedule including diet, activities, and medicines, unless your health care provider tells you otherwise. This information is not intended to replace advice given to you by your health care provider. Make sure you discuss any questions you have with your health care provider. Document Released: 07/06/2000 Document Revised: 02/25/2016 Document Reviewed: 03/16/2013 Elsevier Interactive Patient Education  2017 [place].  Two Gram Sodium Diet 2000 mg  What is Sodium? Sodium is a mineral found naturally in many foods. The most significant source of sodium in the diet is table salt, which is about 40% sodium.  Processed, convenience, and preserved foods also contain a large amount of sodium.  The body needs only 500 mg of sodium daily to function,  A normal diet provides more than enough sodium even if you do not use salt.  Why Limit Sodium? A build up of sodium in the body can cause thirst,  increased blood pressure, shortness of breath, and water retention.  Decreasing sodium in the diet can reduce edema and risk of heart attack or stroke associated with high blood pressure.  Keep in mind that there are many other factors involved in these health problems.  Heredity, obesity, lack of exercise, cigarette smoking, stress and what you eat all play a role.  General Guidelines:  Do not add salt at the table or in cooking.  One teaspoon of salt contains over 2 grams of sodium.  Read food labels  Avoid processed and convenience foods  Ask your dietitian before eating any foods not dicussed in the menu planning guidelines  Consult your physician if you wish to use a salt substitute or a sodium containing medication such as antacids.  Limit milk and milk products to 16 oz (2 cups) per day.  Shopping Hints:  READ LABELS!! "Dietetic" does not necessarily mean low sodium.  Salt and other sodium ingredients are often added to foods during processing.   Menu Planning Guidelines Food Group Choose More Often Avoid  Beverages (see also the milk group All fruit juices, low-sodium, salt-free vegetables juices, low-sodium carbonated beverages Regular vegetable or tomato juices, commercially softened water used for drinking or cooking  Breads and Cereals Enriched white, wheat, rye and pumpernickel bread, hard rolls and dinner rolls; muffins, cornbread and waffles; most dry cereals, cooked cereal without added salt; unsalted crackers and breadsticks; low sodium or homemade bread crumbs Bread, rolls and crackers with salted tops; quick breads; instant hot cereals; pancakes; commercial bread stuffing; self-rising flower and biscuit mixes; regular bread crumbs or cracker crumbs  Desserts and Sweets Desserts and sweets mad with mild should be within allowance Instant pudding mixes and cake mixes  Fats Butter or margarine; vegetable oils; unsalted salad dressings, regular salad dressings limited to 1  Tbs; light, sour and heavy cream Regular salad dressings containing bacon fat, bacon bits, and salt pork; snack dips made with instant soup mixes or processed cheese; salted nuts  Fruits Most fresh, frozen and canned fruits Fruits processed with salt or sodium-containing ingredient (some dried fruits are processed with sodium sulfites        Vegetables Fresh, frozen vegetables and low- sodium canned vegetables Regular canned vegetables, sauerkraut, pickled vegetables, and others prepared in brine; frozen vegetables in sauces; vegetables seasoned with ham, bacon or salt pork  Condiments, Sauces, Miscellaneous  Salt substitute with physician's approval; pepper, herbs, spices; vinegar, lemon or lime juice; hot pepper sauce; garlic powder, onion powder, low sodium soy sauce (1 Tbs.); low sodium condiments (ketchup, chili sauce, mustard) in limited amounts (1 tsp.) fresh ground horseradish; unsalted tortilla chips, pretzels, potato chips, popcorn, salsa (1/4 cup) Any seasoning made with salt including garlic salt, celery salt, onion salt, and seasoned salt; sea salt, rock salt, kosher salt; meat tenderizers; monosodium glutamate; mustard, regular soy sauce, barbecue, sauce, chili sauce, teriyaki sauce, steak sauce, Worcestershire sauce, and most flavored vinegars; canned gravy and mixes; regular condiments; salted snack foods, olives, picles, relish, horseradish sauce, catsup   Food preparation: Try these seasonings Meats:    Pork Sage, onion Serve with applesauce  Chicken Poultry seasoning, thyme, parsley Serve with  cranberry sauce  Lamb Curry powder, rosemary, garlic, thyme Serve with mint sauce or jelly  Veal Marjoram, basil Serve with current jelly, cranberry sauce  Beef Pepper, bay leaf Serve with dry mustard, unsalted chive butter  Fish [place] leaf, dill Serve with unsalted lemon butter, unsalted parsley butter  Vegetables:    Asparagus Lemon juice   Broccoli Lemon juice   Carrots Mustard  dressing parsley, mint, nutmeg, glazed with unsalted butter and sugar   Green beans Marjoram, lemon juice, nutmeg,dill seed   Tomatoes Basil, marjoram, onion   Spice /blend for [employer]" 4 tsp ground thyme 1 tsp ground sage 3 tsp ground rosemary 4 tsp ground marjoram   Test your knowledge 1. A product that says "Salt Free" may still contain sodium. True or False 2. Garlic Powder and Hot Pepper Sauce an be used as alternative seasonings.True or False 3. Processed foods have more sodium than fresh foods.  True or False 4. Canned Vegetables have less sodium than froze True or False  WAYS TO DECREASE YOUR SODIUM INTAKE 1. Avoid the use of added salt in cooking and at the table.  Table salt (and other prepared seasonings which contain salt) is probably one of the greatest sources of sodium in the diet.  Unsalted foods can gain flavor from the sweet, sour, and butter taste sensations of herbs and spices.  Instead of using salt for seasoning, try the following seasonings with the foods listed.  Remember: how you use them to enhance natural food flavors is limited only by your creativity... Allspice-Meat, fish, eggs, fruit, peas, red and yellow vegetables Almond Extract-Fruit baked goods Anise Seed-Sweet breads, fruit, carrots, beets, cottage cheese, cookies (tastes like licorice) Basil-Meat, fish, eggs, vegetables, rice, vegetables salads, soups, sauces Bay Leaf-Meat, fish, stews, poultry Burnet-Salad, vegetables (cucumber-like flavor) Caraway Seed-Bread, cookies, cottage cheese, meat, vegetables, cheese, rice Cardamon-Baked goods, fruit, soups Celery Powder or seed-Salads, salad dressings, sauces, meatloaf, soup, bread.Do not use  celery salt Chervil-Meats, salads, fish, eggs, vegetables, cottage cheese (parsley-like flavor) Chili Power-Meatloaf, chicken cheese, corn, eggplant, egg dishes Chives-Salads cottage cheese, egg dishes, soups, vegetables, sauces Cilantro-Salsa,  casseroles Cinnamon-Baked goods, fruit, pork, lamb, chicken, carrots Cloves-Fruit, baked goods, fish, pot roast, green beans, beets, carrots Coriander-Pastry, cookies, meat, salads, cheese (lemon-orange flavor) Cumin-Meatloaf, fish,cheese, eggs, cabbage,fruit pie (caraway flavor) [employer], fruit, eggs, fish, poultry, cottage cheese, vegetables Dill Seed-Meat, cottage cheese, poultry, vegetables, fish, salads, bread Fennel Seed-Bread, cookies, apples, pork, eggs, fish, beets, cabbage, cheese, Licorice-like flavor Garlic-(buds or powder) Salads, meat, poultry, fish, bread, butter, vegetables, potatoes.Do not  use garlic salt Ginger-Fruit, vegetables, baked goods, meat, fish, poultry Horseradish Root-Meet, vegetables, butter Lemon Juice or Extract-Vegetables, fruit, tea, baked goods, fish salads Mace-Baked goods fruit, vegetables, fish, poultry (taste like nutmeg) Maple Extract-Syrups Marjoram-Meat, chicken, fish, vegetables, breads, green salads (taste like Sage) Mint-Tea, lamb, sherbet, vegetables, desserts, carrots, cabbage Mustard, Dry or Seed-Cheese, eggs, meats, vegetables, poultry Nutmeg-Baked goods, fruit, chicken, eggs, vegetables, desserts Onion Powder-Meat, fish, poultry, vegetables, cheese, eggs, bread, rice salads (Do not use   Onion salt) Orange Extract-Desserts, baked goods Oregano-Pasta, eggs, cheese, onions, pork, lamb, fish, chicken, vegetables, green salads Paprika-Meat, fish, poultry, eggs, cheese, vegetables Parsley Flakes-Butter, vegetables, meat fish, poultry, eggs, bread, salads (certain forms may   Contain sodium Pepper-Meat fish, poultry, vegetables, eggs Peppermint Extract-Desserts, baked goods Poppy Seed-Eggs, bread, cheese, fruit dressings, baked goods, noodles, vegetables, cottage  [employer], poultry, meat, fish, cauliflower, turnips,eggs bread Saffron-Rice, bread, veal, chicken, fish, eggs Sage-Meat, fish,  poultry, onions, eggplant, tomateos, pork, stews Savory-Eggs, salads, poultry, meat, rice, vegetables, soups, pork Tarragon-Meat, poultry, fish, eggs, butter, vegetables (licorice-like flavor)  Thyme-Meat, poultry, fish, eggs, vegetables, (clover-like flavor), sauces, soups Tumeric-Salads, butter, eggs, fish, rice, vegetables (saffron-like flavor) Vanilla Extract-Baked goods, candy Vinegar-Salads, vegetables, meat marinades Walnut Extract-baked goods, candy  2. Choose your Foods Wisely   The following is a list of foods to avoid which are high in sodium:  Meats-Avoid all smoked, canned, salt cured, dried and kosher meat and fish as well as Anchovies   [employer] meats:Bologna, Liverwurst, Pastrami Canned meat or fish  Marinated herring Caviar    Pepperoni Corned Beef   Pizza Dried chipped beef  Salami Frozen breaded fish or meat Salt pork Frankfurters or hot dogs  Sardines Gefilte fish   Sausage Ham (boiled ham, Proscuitto Smoked butt    spiced ham)   Spam      TV Dinners Vegetables Canned vegetables (Regular) Relish Canned mushrooms  Sauerkraut Olives    Tomato juice Pickles  Bakery and Dessert Products Canned puddings  Cream pies Cheesecake   Decorated cakes Cookies  Beverages/Juices Tomato juice, regular  Gatorade   V-8 vegetable juice, regular  Breads and Cereals Biscuit mixes   Salted potato chips, corn chips, pretzels Bread stuffing mixes  Salted crackers and rolls Pancake and waffle mixes Self-rising flour  Seasonings Accent    Meat sauces Barbecue sauce  Meat tenderizer Catsup    Monosodium glutamate (MSG) Celery salt   Onion salt Chili sauce   Prepared mustard Garlic salt   Salt, seasoned salt, sea salt Gravy mixes   Soy sauce Horseradish   Steak sauce Ketchup   Tartar sauce Lite salt    Teriyaki sauce Marinade mixes   Worcestershire sauce  Others Baking powder   Cocoa and cocoa mixes Baking soda   Commercial casserole  mixes Candy-caramels, chocolate  Dehydrated soups    Bars, fudge,nougats  Instant rice and pasta mixes Canned broth or soup  Maraschino cherries Cheese, aged and processed cheese and cheese spreads  Learning Assessment Quiz  Indicated T (for True) or F (for False) for each of the following statements:  1. _____ Fresh fruits and vegetables and unprocessed grains are generally low in sodium 2. _____ Water may contain a considerable amount of sodium, depending on the source 3. _____ You can always tell if a food is high in sodium by tasting it 4. _____ Certain laxatives my be high in sodium and should be avoided unless prescribed   by a physician or pharmacist 5. _____ Salt substitutes may be used freely by anyone on a sodium restricted diet 6. _____ Sodium is present in table salt, food additives and as a natural component of   most foods 7. _____ Table salt is approximately 90% sodium 8. _____ Limiting sodium intake may help prevent excess fluid accumulation in the body 9. _____ On a sodium-restricted diet, seasonings such as bouillon soy sauce, and    cooking wine should be used in place of table salt 10. _____ On an ingredient list, a product which lists monosodium glutamate as the first   ingredient is an appropriate food to include on a low sodium diet  Circle the best answer(s) to the following statements (Hint: there may be more than one correct answer)  11. On a low-sodium diet, some acceptable snack items are:    A. Olives  F. Bean dip   K. Grapefruit juice    B.  Salted Pretzels G. Commercial Popcorn   L. Canned peaches    C. Carrot Sticks  H. Bouillon   M. Unsalted nuts   D. [place] fries  I. Peanut butter crackers N. Salami   E. Sweet pickles J. Tomato Juice   O. Pizza  12.  Seasonings that may be used freely on a reduced - sodium diet include   A. Lemon wedges F.Monosodium glutamate K. Celery seed    B.Soysauce   G. Pepper   L. Mustard powder   C. Sea salt  H.  Cooking wine  M. Onion flakes   D. Vinegar  E. Prepared horseradish N. Salsa   E. Sage   J. Worcestershire sauce  O. Chutney

## 2018-06-26 DIAGNOSIS — L84 Corns and callosities: Secondary | ICD-10-CM | POA: Diagnosis not present

## 2018-06-26 DIAGNOSIS — B351 Tinea unguium: Secondary | ICD-10-CM | POA: Diagnosis not present

## 2018-07-02 ENCOUNTER — Ambulatory Visit (HOSPITAL_COMMUNITY): Payer: Medicare Other | Attending: Cardiology

## 2018-07-02 ENCOUNTER — Other Ambulatory Visit: Payer: Self-pay

## 2018-07-02 DIAGNOSIS — I420 Dilated cardiomyopathy: Secondary | ICD-10-CM | POA: Diagnosis not present

## 2018-07-02 DIAGNOSIS — I5042 Chronic combined systolic (congestive) and diastolic (congestive) heart failure: Secondary | ICD-10-CM | POA: Diagnosis not present

## 2018-07-03 ENCOUNTER — Telehealth: Payer: Self-pay

## 2018-07-03 NOTE — Telephone Encounter (Signed)
Attempted to contact patient but there was no answer and VM not set up. Will try again at another time.

## 2018-07-03 NOTE — Telephone Encounter (Signed)
-----   Message from Imogene Burn, [place] sent at 07/03/2018  8:03 AM EST ----- Echo similar to last year with moderate thickening from hypertension and heart function reduced 40-45% same as last echo. Some trouble relaxing and mild murmurs. No changes

## 2018-07-08 ENCOUNTER — Encounter: Payer: Self-pay | Admitting: Physician Assistant

## 2018-07-08 ENCOUNTER — Ambulatory Visit: Payer: Medicare Other | Admitting: Physician Assistant

## 2018-07-08 VITALS — BP 120/78 | HR 95 | Ht 67.5 in | Wt 176.8 lb

## 2018-07-08 DIAGNOSIS — F1010 Alcohol abuse, uncomplicated: Secondary | ICD-10-CM | POA: Diagnosis not present

## 2018-07-08 DIAGNOSIS — I10 Essential (primary) hypertension: Secondary | ICD-10-CM | POA: Diagnosis not present

## 2018-07-08 DIAGNOSIS — I4892 Unspecified atrial flutter: Secondary | ICD-10-CM | POA: Diagnosis not present

## 2018-07-08 NOTE — Patient Instructions (Addendum)
Medication Instructions:  Your physician recommends that you continue on your current medications as directed. Please refer to the Current Medication list given to you today.  If you need a refill on your cardiac medications before your next appointment, please call your pharmacy.   Lab work: TODAY: BMET If you have labs (blood work) drawn today and your tests are completely normal, you will receive your results only by: Marland Kitchen MyChart Message (if you have MyChart) OR . A paper copy in the mail If you have any lab test that is abnormal or we need to change your treatment, we will call you to review the results.  Testing/Procedures: None ordered  Follow-Up: . Your physician recommends that you schedule a follow-up appointment in: 3 months with Dr. Lovena Le .   Any Other Special Instructions Will Be Listed Below (If Applicable).

## 2018-07-08 NOTE — Progress Notes (Signed)
Cardiology Office Note    Date:  07/08/2018   ID:  Lucas Fuller, DOB 12/11/1940, MRN [id number]  PCP:  Merrilee Seashore, MD  Cardiologist: [name], MD EPS: None  Chief Complaint  Patient presents with  . Follow-up    History of Present Illness:  Lucas Fuller is a 77 y.o. male  with history of atrial flutter ablation, nonischemic cardiomyopathy with LV dysfunction EF previously 20 to 25%.  Normal coronary arteries on cath in 2017.  Patient last saw Dr. Lovena Le 01/28/2017 at which time the patient stopped his Eliquis and was continued on low-dose aspirin.  Repeat 2D echo 02/14/2017 LVEF improved to 40 to 45% with mild diffuse hypokinesis and grade 1 DD.   I saw the patient 06/24/2018.  The patient had run out of his blood pressure medicines for at least 6 months and was started on low-dose meds by PCP.  I resumed Lasix 40 mg daily potassium 20 mEq as needed, continue carvedilol 6.25 twice daily and restarted losartan 25 mg daily.  Patient comes in today for follow-up.  Blood pressure is well controlled.  He is taking all his medications as prescribed.  Reviewed echo in detail with patient.     Past Medical History:  Diagnosis Date  . Atrial fibrillation, persistent   . Bladder cancer ([place])   . Chronic systolic CHF (congestive heart failure) ([place])   . History of blood transfusion   . Hypothyroidism   . Migraine    "years ago" (05/17/2016)  . Non-ischemic cardiomyopathy ([place])   . Thyroid disease     Past Surgical History:  Procedure Laterality Date  . CARDIAC CATHETERIZATION N/A 12/08/2015   Procedure: Left Heart Cath and Coronary Angiography;  Surgeon: Lorretta Harp, MD;  Location: [facility];  Service: Cardiovascular;  Laterality: N/A;  . CYSTOSCOPY WITH FULGERATION  2008   Archie Endo 11/23/2010  . ELECTROPHYSIOLOGIC STUDY N/A 05/17/2016   Procedure: A-Flutter Ablation;  Surgeon: Evans Lance, MD;  Location: [facility];  Service: Cardiovascular;   Laterality: N/A;  . TONSILLECTOMY    . TRANSURETHRAL RESECTION OF BLADDER  2008   Archie Endo 11/23/2010    Current Medications: Current Meds  Medication Sig  . aspirin EC 81 MG tablet Take 1 tablet (81 mg total) by mouth daily.  . carvedilol (COREG) 6.25 MG tablet Take 1 tablet (6.25 mg total) by mouth 2 (two) times daily.  . furosemide (LASIX) 40 MG tablet Take 1 tablet (40 mg total) by mouth daily as needed (swelling).  Marland Kitchen levothyroxine (SYNTHROID, LEVOTHROID) 50 MCG tablet Take 50 mcg by mouth daily before breakfast.  . lisinopril (PRINIVIL,ZESTRIL) 2.5 MG tablet   . losartan (COZAAR) 25 MG tablet Take 1 tablet (25 mg total) by mouth daily.  . potassium chloride SA (KLOR-CON M20) 20 MEQ tablet Take 1 tablet (20 mEq total) by mouth daily as needed (with lasix only).     Allergies:   Asa [aspirin]   Social History   Socioeconomic History  . Marital status: Divorced    Spouse name: Not on file  . Number of children: Not on file  . Years of education: Not on file  . Highest education level: Not on file  Occupational History  . Not on file  Social Needs  . Financial resource strain: Not on file  . Food insecurity:    Worry: Not on file    Inability: Not on file  . Transportation needs:    Medical: Not  on file    Non-medical: Not on file  Tobacco Use  . Smoking status: Never Smoker  . Smokeless tobacco: Never Used  Substance and Sexual Activity  . Alcohol use: Yes    Comment: 05/17/2016 "stopped drinking ~ 09/2015"  . Drug use: No  . Sexual activity: Not Currently  Lifestyle  . Physical activity:    Days per week: Not on file    Minutes per session: Not on file  . Stress: Not on file  Relationships  . Social connections:    Talks on phone: Not on file    Gets together: Not on file    Attends religious service: Not on file    Active member of club or organization: Not on file    Attends meetings of clubs or organizations: Not on file    Relationship status: Not on file    Other Topics Concern  . Not on file  Social History Narrative  . Not on file     Family History:  The patient's family history includes CVA in his sister; Diabetes in his sister; Heart attack (age of onset: 5) in his father; Hypertension in his sister; Kidney disease in his sister; Lung cancer in his sister.   ROS:   Please see the history of present illness.    Review of Systems  Constitution: Negative.  HENT: Negative.   Cardiovascular: Negative.   Respiratory: Negative.   Endocrine: Negative.   Hematologic/Lymphatic: Negative.   Musculoskeletal: Negative.   Gastrointestinal: Negative.   Genitourinary: Negative.   Neurological: Negative.    All other systems reviewed and are negative.   PHYSICAL EXAM:   VS:  BP 120/78   Pulse 95   Ht 5' 7.5" (1.715 m)   Wt 176 lb 12.8 oz (80.2 kg)   SpO2 98%   BMI 27.28 kg/m   Physical Exam  GEN: Well nourished, well developed, in no acute distress  Neck: no JVD, carotid bruits, or masses Cardiac:RRR; no murmurs, rubs, or gallops  Respiratory:  clear to auscultation bilaterally, normal work of breathing GI: soft, nontender, nondistended, + BS Ext: without cyanosis, clubbing, or edema, Good distal pulses bilaterally Neuro:  Alert and Oriented x 3 Psych: euthymic mood, full affect  Wt Readings from Last 3 Encounters:  07/08/18 176 lb 12.8 oz (80.2 kg)  06/24/18 178 lb 12.8 oz (81.1 kg)  01/28/17 196 lb 3.2 oz (89 kg)      Studies/Labs Reviewed:   EKG:  EKG is not ordered today.    Recent Labs: No results found for requested labs within last 8760 hours.   Lipid Panel    Component Value Date/Time   CHOL  06/24/2009 0540    155        ATP III CLASSIFICATION:  <200     mg/dL   Desirable  200-239  mg/dL   Borderline High  >=240    mg/dL   High          TRIG 60 06/24/2009 0540   HDL 34 (L) 06/24/2009 0540   CHOLHDL 4.6 06/24/2009 0540   VLDL 12 06/24/2009 0540   LDLCALC (H) 06/24/2009 0540    109        Total  Cholesterol/HDL:CHD Risk Coronary Heart Disease Risk Table                     Men   Women  1/2 Average Risk   3.4   3.3  Average Risk  5.0   4.4  2 X Average Risk   9.6   7.1  3 X Average Risk  23.4   11.0        Use the calculated Patient Ratio above and the CHD Risk Table to determine the patient's CHD Risk.        ATP III CLASSIFICATION (LDL):  <100     mg/dL   Optimal  100-129  mg/dL   Near or Above                    Optimal  130-159  mg/dL   Borderline  160-189  mg/dL   High  >190     mg/dL   Very High    Additional studies/ records that were reviewed today include:  2D echo 07/02/2018 Study Conclusions   - Left ventricle: There was moderate concentric hypertrophy.   Systolic function was mildly to moderately reduced. The estimated   ejection fraction was in the range of 40% to 45%. Diffuse   hypokinesis. Although no diagnostic regional wall motion   abnormality was identified, this possibility cannot be completely   excluded on the basis of this study. Doppler parameters are   consistent with abnormal left ventricular relaxation (grade 1   diastolic dysfunction). - Aortic valve: Trileaflet; moderately calcified leaflets,   predominantly noncoronary cusp. There was mild regurgitation. - Mitral valve: Calcified annulus. There was mild regurgitation.   Valve area by pressure half-time: 2.5 cm^2. - Left atrium: The atrium was mildly dilated. - Atrial septum: The septum bowed from left to right, consistent   with increased left atrial pressure. There was a possible, very   small patent foramen ovale.   Impressions:   - No significant change from prior.     ASSESSMENT:    1. Essential hypertension   2. Atrial flutter, unspecified type ([place])   3. ETOH abuse      PLAN:  In order of problems listed above:  Essential hypertension blood pressure much better controlled.  We will recheck renal function today on losartan.  Follow-up with Dr. Lovena Le in 3 to 4  months.  Atrial flutter status post ablation in the past was in normal sinus rhythm last office visit  History of alcohol abuse.  Drinks 2 airplane bottles daily  Medication Adjustments/Labs and Tests Ordered: Current medicines are reviewed at length with the patient today.  Concerns regarding medicines are outlined above.  Medication changes, Labs and Tests ordered today are listed in the Patient Instructions below. Patient Instructions  Medication Instructions:  Your physician recommends that you continue on your current medications as directed. Please refer to the Current Medication list given to you today.  If you need a refill on your cardiac medications before your next appointment, please call your pharmacy.   Lab work: TODAY: BMET If you have labs (blood work) drawn today and your tests are completely normal, you will receive your results only by: Marland Kitchen MyChart Message (if you have MyChart) OR . A paper copy in the mail If you have any lab test that is abnormal or we need to change your treatment, we will call you to review the results.  Testing/Procedures: None ordered  Follow-Up: . Your physician recommends that you schedule a follow-up appointment in: 3 months with Dr. Lovena Le .   Any Other Special Instructions Will Be Listed Below (If Applicable).       Sumner Boast, PA-C  07/08/2018 2:48 PM    [facility] [place], [place], [place]  [address] Phone: [phone]; Fax: [phone]

## 2018-07-09 LAB — BASIC METABOLIC PANEL
BUN/Creatinine Ratio: 15 (ref 10–24)
BUN: 14 mg/dL (ref 8–27)
CO2: 21 mmol/L (ref 20–29)
Calcium: 9.2 mg/dL (ref 8.6–10.2)
Chloride: 106 mmol/L (ref 96–106)
Creatinine, Ser: 0.94 mg/dL (ref 0.76–1.27)
GFR calc Af Amer: 89 mL/min/{1.73_m2} (ref >59)
GFR calc non Af Amer: 77 mL/min/{1.73_m2} (ref >59)
Glucose: 99 mg/dL (ref 65–99)
Potassium: 4.2 mmol/L (ref 3.5–5.2)
Sodium: 142 mmol/L (ref 134–144)

## 2018-07-09 NOTE — Telephone Encounter (Signed)
Echo results reviewed at [facility] yesterday

## 2018-07-21 DIAGNOSIS — E782 Mixed hyperlipidemia: Secondary | ICD-10-CM | POA: Diagnosis not present

## 2018-07-21 DIAGNOSIS — I10 Essential (primary) hypertension: Secondary | ICD-10-CM | POA: Diagnosis not present

## 2018-07-21 DIAGNOSIS — E039 Hypothyroidism, unspecified: Secondary | ICD-10-CM | POA: Diagnosis not present

## 2018-07-28 DIAGNOSIS — I4892 Unspecified atrial flutter: Secondary | ICD-10-CM | POA: Diagnosis not present

## 2018-07-28 DIAGNOSIS — D696 Thrombocytopenia, unspecified: Secondary | ICD-10-CM | POA: Diagnosis not present

## 2018-07-28 DIAGNOSIS — Z0000 Encounter for general adult medical examination without abnormal findings: Secondary | ICD-10-CM | POA: Diagnosis not present

## 2018-07-28 DIAGNOSIS — Z23 Encounter for immunization: Secondary | ICD-10-CM | POA: Diagnosis not present

## 2018-09-18 DIAGNOSIS — B351 Tinea unguium: Secondary | ICD-10-CM | POA: Diagnosis not present

## 2018-09-18 DIAGNOSIS — L84 Corns and callosities: Secondary | ICD-10-CM | POA: Diagnosis not present

## 2018-09-24 ENCOUNTER — Encounter: Payer: Self-pay | Admitting: Internal Medicine

## 2018-10-07 ENCOUNTER — Telehealth: Payer: Self-pay

## 2018-10-07 NOTE — Telephone Encounter (Signed)
Call placed to Pt. Left detailed VM for Pt and niece per DPR.  Advised at this time-routine follow up appointments are being cancelled to reduce possible exposure for our patients.  Advised appointment would be cancelled and Pt would be contacted to reschedule.  Advised to call office if any needs.

## 2018-10-09 ENCOUNTER — Ambulatory Visit: Payer: Medicare Other | Admitting: Internal Medicine

## 2018-11-27 DIAGNOSIS — L84 Corns and callosities: Secondary | ICD-10-CM | POA: Diagnosis not present

## 2018-11-27 DIAGNOSIS — B351 Tinea unguium: Secondary | ICD-10-CM | POA: Diagnosis not present

## 2019-02-02 DIAGNOSIS — I5022 Chronic systolic (congestive) heart failure: Secondary | ICD-10-CM | POA: Diagnosis not present

## 2019-02-02 DIAGNOSIS — E782 Mixed hyperlipidemia: Secondary | ICD-10-CM | POA: Diagnosis not present

## 2019-02-02 DIAGNOSIS — I4892 Unspecified atrial flutter: Secondary | ICD-10-CM | POA: Diagnosis not present

## 2019-02-02 DIAGNOSIS — I10 Essential (primary) hypertension: Secondary | ICD-10-CM | POA: Diagnosis not present

## 2019-02-04 ENCOUNTER — Telehealth: Payer: Self-pay | Admitting: Internal Medicine

## 2019-02-04 NOTE — Telephone Encounter (Signed)

## 2019-02-05 ENCOUNTER — Other Ambulatory Visit: Payer: Self-pay

## 2019-02-05 ENCOUNTER — Encounter: Payer: Self-pay | Admitting: Internal Medicine

## 2019-02-05 ENCOUNTER — Encounter (INDEPENDENT_AMBULATORY_CARE_PROVIDER_SITE_OTHER): Payer: Self-pay

## 2019-02-05 ENCOUNTER — Ambulatory Visit (INDEPENDENT_AMBULATORY_CARE_PROVIDER_SITE_OTHER): Payer: Medicare Other | Admitting: Internal Medicine

## 2019-02-05 VITALS — BP 130/74 | HR 71 | Ht 67.5 in | Wt 174.6 lb

## 2019-02-05 DIAGNOSIS — I429 Cardiomyopathy, unspecified: Secondary | ICD-10-CM

## 2019-02-05 DIAGNOSIS — I4892 Unspecified atrial flutter: Secondary | ICD-10-CM

## 2019-02-05 NOTE — Progress Notes (Signed)
PCP:  Merrilee Seashore, MD Primary Cardiologist: [name], MD Electrophysiologist: None   Lucas Fuller is a 78 y.o. male who presents today for routine electrophysiology followup. He has a PMH of A-flutter s/p ablation 26/33/3545, Chronic systolic CHF due to NICM, HTN, and ETOH abuse. He was last seen in the EP clinic 01/2017 at which point his Eliquis was stopped and continued on low dose ASA.  Since last being seen in our clinic, the patient reports doing very well.    He saw Ermalinda Barrios, PA-C 06/2018 and was relatively stable. He intermittently runs out of his medications. He confirms he is not taking losartan, but just lisinopril. He takes lasix as needed for peripheral edema. He ends up taking every 2-3 days along with prn potassium. He occasionally gets SOB with moderate exertion. He denies chest pain,  dizziness, or palpitations. He fell off of his porch last week after sitting out the heat and getting up to quickly, but denies injury.    The patient feels that he is tolerating medications without difficulties and is otherwise without complaint today.   Past Medical History:  Diagnosis Date  . Atrial fibrillation, persistent   . Bladder cancer ([place])   . Chronic systolic CHF (congestive heart failure) ([place])   . History of blood transfusion   . Hypothyroidism   . Migraine    "years ago" (05/17/2016)  . Non-ischemic cardiomyopathy ([place])   . Thyroid disease    Past Surgical History:  Procedure Laterality Date  . CARDIAC CATHETERIZATION N/A 12/08/2015   Procedure: Left Heart Cath and Coronary Angiography;  Surgeon: Lorretta Harp, MD;  Location: [facility];  Service: Cardiovascular;  Laterality: N/A;  . CYSTOSCOPY WITH FULGERATION  2008   Archie Endo 11/23/2010  . ELECTROPHYSIOLOGIC STUDY N/A 05/17/2016   Procedure: A-Flutter Ablation;  Surgeon: Evans Lance, MD;  Location: [facility];  Service: Cardiovascular;  Laterality: N/A;  . TONSILLECTOMY    .  TRANSURETHRAL RESECTION OF BLADDER  2008   Archie Endo 11/23/2010    Current Outpatient Medications  Medication Sig Dispense Refill  . aspirin EC 81 MG tablet Take 1 tablet (81 mg total) by mouth daily. 30 tablet 6  . carvedilol (COREG) 6.25 MG tablet Take 1 tablet (6.25 mg total) by mouth 2 (two) times daily. 180 tablet 3  . furosemide (LASIX) 40 MG tablet Take 1 tablet (40 mg total) by mouth daily as needed (swelling). 30 tablet 11  . levothyroxine (SYNTHROID, LEVOTHROID) 50 MCG tablet Take 50 mcg by mouth daily before breakfast.    . lisinopril (ZESTRIL) 2.5 MG tablet Take 2.5 mg by mouth daily.    . potassium chloride SA (KLOR-CON M20) 20 MEQ tablet Take 1 tablet (20 mEq total) by mouth daily as needed (with lasix only). 30 tablet 11   No current facility-administered medications for this visit.     Allergies  Allergen Reactions  . Asa [Aspirin] Hives    Pt can tolerate 81 mg with no issues    Social History   Socioeconomic History  . Marital status: Divorced    Spouse name: Not on file  . Number of children: Not on file  . Years of education: Not on file  . Highest education level: Not on file  Occupational History  . Not on file  Social Needs  . Financial resource strain: Not on file  . Food insecurity    Worry: Not on file    Inability: Not on file  .  Transportation needs    Medical: Not on file    Non-medical: Not on file  Tobacco Use  . Smoking status: Never Smoker  . Smokeless tobacco: Never Used  Substance and Sexual Activity  . Alcohol use: Yes    Comment: 05/17/2016 "stopped drinking ~ 09/2015"  . Drug use: No  . Sexual activity: Not Currently  Lifestyle  . Physical activity    Days per week: Not on file    Minutes per session: Not on file  . Stress: Not on file  Relationships  . Social Herbalist on phone: Not on file    Gets together: Not on file    Attends religious service: Not on file    Active member of club or organization: Not on file     Attends meetings of clubs or organizations: Not on file    Relationship status: Not on file  . Intimate partner violence    Fear of current or ex partner: Not on file    Emotionally abused: Not on file    Physically abused: Not on file    Forced sexual activity: Not on file  Other Topics Concern  . Not on file  Social History Narrative  . Not on file     Review of Systems: General: No chills, fever, night sweats or weight changes  Cardiovascular:  No chest pain, dyspnea on exertion, edema, orthopnea, palpitations, paroxysmal nocturnal dyspnea Dermatological: No rash, lesions or masses Respiratory: No cough, dyspnea Urologic: No hematuria, dysuria Abdominal: No nausea, vomiting, diarrhea, bright red blood per rectum, melena, or hematemesis Neurologic: No visual changes, weakness, changes in mental status All other systems reviewed and are otherwise negative except as noted above.  Physical Exam: Vitals:   02/05/19 1151  BP: 130/74  Pulse: 71  SpO2: 98%  Weight: 174 lb 9.6 oz (79.2 kg)  Height: 5' 7.5" (1.715 m)    GEN- The patient is well appearing, alert and oriented x 3 today.   HEENT: normocephalic, atraumatic; sclera clear, conjunctiva pink; hearing intact; oropharynx clear; neck supple, no JVP Lymph- no cervical lymphadenopathy Lungs- Clear to ausculation bilaterally, normal work of breathing.  No wheezes, rales, rhonchi Heart- Regular rate and rhythm, no murmurs, rubs or gallops, PMI not laterally displaced GI- soft, non-tender, non-distended, bowel sounds present, no hepatosplenomegaly Extremities- no clubbing, cyanosis, or edema; DP/PT/radial pulses 2+ bilaterally MS- no significant deformity or atrophy Skin- warm and dry, no rash or lesion Psych- euthymic mood, full affect Neuro- strength and sensation are intact  Assessment and Plan:  1. Chronic systolic CHF - Euvolemic today with prn lasix. Most recent EF 40-45%. Continue BB and ARB.   2. Aflutter s/p  ablation 04/2016 - He has had no recurrence.   3. HTN - his blood pressure is controlled. No change in meds for now.  4. Disp. - we will see him back in 6 months.  [name].D.

## 2019-02-05 NOTE — Patient Instructions (Addendum)

## 2019-02-09 DIAGNOSIS — E039 Hypothyroidism, unspecified: Secondary | ICD-10-CM | POA: Diagnosis not present

## 2019-02-09 DIAGNOSIS — I10 Essential (primary) hypertension: Secondary | ICD-10-CM | POA: Diagnosis not present

## 2019-02-09 DIAGNOSIS — Z23 Encounter for immunization: Secondary | ICD-10-CM | POA: Diagnosis not present

## 2019-02-09 DIAGNOSIS — I428 Other cardiomyopathies: Secondary | ICD-10-CM | POA: Diagnosis not present

## 2019-02-09 DIAGNOSIS — I5022 Chronic systolic (congestive) heart failure: Secondary | ICD-10-CM | POA: Diagnosis not present

## 2019-02-09 DIAGNOSIS — E782 Mixed hyperlipidemia: Secondary | ICD-10-CM | POA: Diagnosis not present

## 2019-02-26 DIAGNOSIS — L84 Corns and callosities: Secondary | ICD-10-CM | POA: Diagnosis not present

## 2019-02-26 DIAGNOSIS — B351 Tinea unguium: Secondary | ICD-10-CM | POA: Diagnosis not present

## 2019-05-28 DIAGNOSIS — B351 Tinea unguium: Secondary | ICD-10-CM | POA: Diagnosis not present

## 2019-05-28 DIAGNOSIS — L84 Corns and callosities: Secondary | ICD-10-CM | POA: Diagnosis not present

## 2019-06-08 DIAGNOSIS — M7711 Lateral epicondylitis, right elbow: Secondary | ICD-10-CM | POA: Diagnosis not present

## 2019-06-08 DIAGNOSIS — M79601 Pain in right arm: Secondary | ICD-10-CM | POA: Diagnosis not present

## 2019-06-15 DIAGNOSIS — M7711 Lateral epicondylitis, right elbow: Secondary | ICD-10-CM | POA: Diagnosis not present

## 2019-06-15 DIAGNOSIS — I10 Essential (primary) hypertension: Secondary | ICD-10-CM | POA: Diagnosis not present

## 2019-06-15 DIAGNOSIS — M79601 Pain in right arm: Secondary | ICD-10-CM | POA: Diagnosis not present

## 2019-06-15 DIAGNOSIS — Z23 Encounter for immunization: Secondary | ICD-10-CM | POA: Diagnosis not present

## 2019-07-30 ENCOUNTER — Other Ambulatory Visit: Payer: Self-pay | Admitting: Physician Assistant

## 2019-07-30 DIAGNOSIS — I5042 Chronic combined systolic (congestive) and diastolic (congestive) heart failure: Secondary | ICD-10-CM

## 2019-08-18 ENCOUNTER — Encounter (HOSPITAL_COMMUNITY): Payer: Self-pay | Admitting: Emergency Medicine

## 2019-08-18 ENCOUNTER — Other Ambulatory Visit: Payer: Self-pay

## 2019-08-18 ENCOUNTER — Inpatient Hospital Stay (HOSPITAL_COMMUNITY): Payer: Medicare Other

## 2019-08-18 ENCOUNTER — Ambulatory Visit: Payer: Medicare Other | Attending: Internal Medicine

## 2019-08-18 ENCOUNTER — Inpatient Hospital Stay (HOSPITAL_COMMUNITY)
Admission: EM | Admit: 2019-08-18 | Discharge: 2019-08-22 | DRG: 177 | Disposition: A | Discharge status: Home Health | Payer: Medicare Other | Attending: Internal Medicine | Admitting: Internal Medicine

## 2019-08-18 ENCOUNTER — Emergency Department (HOSPITAL_COMMUNITY): Payer: Medicare Other

## 2019-08-18 DIAGNOSIS — Z841 Family history of disorders of kidney and ureter: Secondary | ICD-10-CM

## 2019-08-18 DIAGNOSIS — I5040 Unspecified combined systolic (congestive) and diastolic (congestive) heart failure: Secondary | ICD-10-CM

## 2019-08-18 DIAGNOSIS — E785 Hyperlipidemia, unspecified: Secondary | ICD-10-CM | POA: Diagnosis present

## 2019-08-18 DIAGNOSIS — I5022 Chronic systolic (congestive) heart failure: Secondary | ICD-10-CM | POA: Diagnosis present

## 2019-08-18 DIAGNOSIS — I10 Essential (primary) hypertension: Secondary | ICD-10-CM | POA: Diagnosis present

## 2019-08-18 DIAGNOSIS — Z7401 Bed confinement status: Secondary | ICD-10-CM | POA: Diagnosis not present

## 2019-08-18 DIAGNOSIS — I959 Hypotension, unspecified: Secondary | ICD-10-CM | POA: Diagnosis not present

## 2019-08-18 DIAGNOSIS — Z9181 History of falling: Secondary | ICD-10-CM

## 2019-08-18 DIAGNOSIS — Z886 Allergy status to analgesic agent status: Secondary | ICD-10-CM

## 2019-08-18 DIAGNOSIS — R0602 Shortness of breath: Secondary | ICD-10-CM | POA: Diagnosis not present

## 2019-08-18 DIAGNOSIS — R634 Abnormal weight loss: Secondary | ICD-10-CM | POA: Diagnosis present

## 2019-08-18 DIAGNOSIS — I4892 Unspecified atrial flutter: Secondary | ICD-10-CM | POA: Diagnosis not present

## 2019-08-18 DIAGNOSIS — Z6823 Body mass index (BMI) 23.0-23.9, adult: Secondary | ICD-10-CM

## 2019-08-18 DIAGNOSIS — I4819 Other persistent atrial fibrillation: Secondary | ICD-10-CM | POA: Diagnosis present

## 2019-08-18 DIAGNOSIS — U071 COVID-19: Principal | ICD-10-CM

## 2019-08-18 DIAGNOSIS — Z743 Need for continuous supervision: Secondary | ICD-10-CM | POA: Diagnosis not present

## 2019-08-18 DIAGNOSIS — Z79890 Hormone replacement therapy (postmenopausal): Secondary | ICD-10-CM

## 2019-08-18 DIAGNOSIS — J1282 Pneumonia due to coronavirus disease 2019: Secondary | ICD-10-CM | POA: Diagnosis not present

## 2019-08-18 DIAGNOSIS — Z7982 Long term (current) use of aspirin: Secondary | ICD-10-CM

## 2019-08-18 DIAGNOSIS — Z801 Family history of malignant neoplasm of trachea, bronchus and lung: Secondary | ICD-10-CM | POA: Diagnosis not present

## 2019-08-18 DIAGNOSIS — I110 Hypertensive heart disease with heart failure: Secondary | ICD-10-CM | POA: Diagnosis not present

## 2019-08-18 DIAGNOSIS — Z8249 Family history of ischemic heart disease and other diseases of the circulatory system: Secondary | ICD-10-CM | POA: Diagnosis not present

## 2019-08-18 DIAGNOSIS — I428 Other cardiomyopathies: Secondary | ICD-10-CM | POA: Diagnosis not present

## 2019-08-18 DIAGNOSIS — I509 Heart failure, unspecified: Secondary | ICD-10-CM | POA: Diagnosis not present

## 2019-08-18 DIAGNOSIS — Z79899 Other long term (current) drug therapy: Secondary | ICD-10-CM

## 2019-08-18 DIAGNOSIS — F1010 Alcohol abuse, uncomplicated: Secondary | ICD-10-CM | POA: Diagnosis present

## 2019-08-18 DIAGNOSIS — E039 Hypothyroidism, unspecified: Secondary | ICD-10-CM | POA: Diagnosis present

## 2019-08-18 DIAGNOSIS — M2550 Pain in unspecified joint: Secondary | ICD-10-CM | POA: Diagnosis not present

## 2019-08-18 DIAGNOSIS — Z8551 Personal history of malignant neoplasm of bladder: Secondary | ICD-10-CM | POA: Diagnosis not present

## 2019-08-18 DIAGNOSIS — Z823 Family history of stroke: Secondary | ICD-10-CM | POA: Diagnosis not present

## 2019-08-18 DIAGNOSIS — Z833 Family history of diabetes mellitus: Secondary | ICD-10-CM | POA: Diagnosis not present

## 2019-08-18 HISTORY — DX: COVID-19: U07.1

## 2019-08-18 HISTORY — DX: Pneumonia due to coronavirus disease 2019: J12.82

## 2019-08-18 LAB — CBC
HCT: 47.6 % (ref 39.0–52.0)
Hemoglobin: 16.1 g/dL (ref 13.0–17.0)
MCH: 32.8 pg (ref 26.0–34.0)
MCHC: 33.8 g/dL (ref 30.0–36.0)
MCV: 96.9 fL (ref 80.0–100.0)
Platelets: 163 10*3/uL (ref 150–400)
RBC: 4.91 MIL/uL (ref 4.22–5.81)
RDW: 13.1 % (ref 11.5–15.5)
WBC: 6.8 10*3/uL (ref 4.0–10.5)
nRBC: 0 % (ref 0.0–0.2)

## 2019-08-18 LAB — BASIC METABOLIC PANEL
Anion gap: 15 (ref 5–15)
BUN: 12 mg/dL (ref 8–23)
CO2: 22 mmol/L (ref 22–32)
Calcium: 8.7 mg/dL — ABNORMAL LOW (ref 8.9–10.3)
Chloride: 101 mmol/L (ref 98–111)
Creatinine, Ser: 1.06 mg/dL (ref 0.61–1.24)
GFR calc Af Amer: >60 mL/min (ref >60)
GFR calc non Af Amer: >60 mL/min (ref >60)
Glucose, Bld: 103 mg/dL — ABNORMAL HIGH (ref 70–99)
Potassium: 3.7 mmol/L (ref 3.5–5.1)
Sodium: 138 mmol/L (ref 135–145)

## 2019-08-18 LAB — HEPATIC FUNCTION PANEL
ALT: 42 U/L (ref 0–44)
AST: 64 U/L — ABNORMAL HIGH (ref 15–41)
Albumin: 3 g/dL — ABNORMAL LOW (ref 3.5–5.0)
Alkaline Phosphatase: 42 U/L (ref 38–126)
Bilirubin, Direct: 0.3 mg/dL — ABNORMAL HIGH (ref 0.0–0.2)
Indirect Bilirubin: 0.6 mg/dL (ref 0.3–0.9)
Total Bilirubin: 0.9 mg/dL (ref 0.3–1.2)
Total Protein: 8.4 g/dL — ABNORMAL HIGH (ref 6.5–8.1)

## 2019-08-18 LAB — RESPIRATORY PANEL BY RT PCR (FLU A&B, COVID)
Influenza A by PCR: NEGATIVE
Influenza B by PCR: NEGATIVE
SARS Coronavirus 2 by RT PCR: POSITIVE — AB

## 2019-08-18 LAB — FIBRINOGEN: Fibrinogen: 715 mg/dL — ABNORMAL HIGH (ref 210–475)

## 2019-08-18 LAB — LACTATE DEHYDROGENASE: LDH: 412 U/L — ABNORMAL HIGH (ref 98–192)

## 2019-08-18 LAB — C-REACTIVE PROTEIN: CRP: 15.8 mg/dL — ABNORMAL HIGH (ref <1.0)

## 2019-08-18 LAB — TRIGLYCERIDES: Triglycerides: 103 mg/dL (ref <150)

## 2019-08-18 LAB — PROCALCITONIN: Procalcitonin: <0.1 ng/mL

## 2019-08-18 LAB — BRAIN NATRIURETIC PEPTIDE: B Natriuretic Peptide: 303.7 pg/mL — ABNORMAL HIGH (ref 0.0–100.0)

## 2019-08-18 LAB — FERRITIN: Ferritin: 1518 ng/mL — ABNORMAL HIGH (ref 24–336)

## 2019-08-18 LAB — D-DIMER, QUANTITATIVE: D-Dimer, Quant: 4.2 ug/mL-FEU — ABNORMAL HIGH (ref 0.00–0.50)

## 2019-08-18 LAB — TSH: TSH: 3.33 u[IU]/mL (ref 0.350–4.500)

## 2019-08-18 MED ORDER — IOHEXOL 350 MG/ML SOLN
75.0000 mL | Freq: Once | INTRAVENOUS | Status: AC | PRN
  Administered 2019-08-18: 75 mL via INTRAVENOUS

## 2019-08-18 MED ORDER — SODIUM CHLORIDE 0.9 % IV SOLN
100.0000 mg | Freq: Every day | INTRAVENOUS | Status: AC
  Administered 2019-08-19 – 2019-08-22 (×4): 100 mg via INTRAVENOUS
  Filled 2019-08-18 (×5): qty 20

## 2019-08-18 MED ORDER — DEXAMETHASONE SODIUM PHOSPHATE 10 MG/ML IJ SOLN
6.0000 mg | Freq: Once | INTRAMUSCULAR | Status: AC
  Administered 2019-08-19: 6 mg via INTRAVENOUS
  Filled 2019-08-18: qty 1

## 2019-08-18 MED ORDER — ALBUTEROL SULFATE HFA 108 (90 BASE) MCG/ACT IN AERS: 2.0000 | INHALATION_SPRAY | RESPIRATORY_TRACT | Status: DC | PRN

## 2019-08-18 MED ORDER — ZINC SULFATE 220 (50 ZN) MG PO CAPS
220.0000 mg | ORAL_CAPSULE | Freq: Every day | ORAL | Status: DC
  Administered 2019-08-19 – 2019-08-22 (×4): 220 mg via ORAL
  Filled 2019-08-18 (×4): qty 1

## 2019-08-18 MED ORDER — LORAZEPAM 1 MG PO TABS: 1.0000 mg | ORAL_TABLET | ORAL | Status: AC | PRN

## 2019-08-18 MED ORDER — HEPARIN BOLUS VIA INFUSION
4000.0000 [IU] | Freq: Once | INTRAVENOUS | Status: AC
  Administered 2019-08-19: 4000 [IU] via INTRAVENOUS
  Filled 2019-08-18: qty 4000

## 2019-08-18 MED ORDER — SODIUM CHLORIDE 0.9 % IV SOLN
200.0000 mg | Freq: Once | INTRAVENOUS | Status: AC
  Administered 2019-08-19: 200 mg via INTRAVENOUS
  Filled 2019-08-18: qty 40

## 2019-08-18 MED ORDER — HEPARIN (PORCINE) 25000 UT/250ML-% IV SOLN
1200.0000 [IU]/h | INTRAVENOUS | Status: DC
  Administered 2019-08-19: 1200 [IU]/h via INTRAVENOUS
  Filled 2019-08-18: qty 250

## 2019-08-18 MED ORDER — LORAZEPAM 2 MG/ML IJ SOLN: 1.0000 mg | INTRAMUSCULAR | Status: AC | PRN

## 2019-08-18 MED ORDER — THIAMINE HCL 100 MG PO TABS
100.0000 mg | ORAL_TABLET | Freq: Every day | ORAL | Status: DC
  Administered 2019-08-20 – 2019-08-22 (×3): 100 mg via ORAL
  Filled 2019-08-18 (×4): qty 1

## 2019-08-18 MED ORDER — ACETAMINOPHEN 325 MG PO TABS: 650.0000 mg | ORAL_TABLET | Freq: Four times a day (QID) | ORAL | Status: DC | PRN

## 2019-08-18 MED ORDER — CARVEDILOL 12.5 MG PO TABS
6.2500 mg | ORAL_TABLET | Freq: Two times a day (BID) | ORAL | Status: DC
  Administered 2019-08-18: 6.25 mg via ORAL
  Filled 2019-08-18: qty 1

## 2019-08-18 MED ORDER — THIAMINE HCL 100 MG/ML IJ SOLN
100.0000 mg | Freq: Every day | INTRAMUSCULAR | Status: DC
  Administered 2019-08-19: 100 mg via INTRAVENOUS
  Filled 2019-08-18 (×2): qty 2

## 2019-08-18 MED ORDER — ADULT MULTIVITAMIN W/MINERALS CH
1.0000 | ORAL_TABLET | Freq: Every day | ORAL | Status: DC
  Administered 2019-08-19 – 2019-08-22 (×4): 1 via ORAL
  Filled 2019-08-18 (×4): qty 1

## 2019-08-18 MED ORDER — DEXAMETHASONE SODIUM PHOSPHATE 10 MG/ML IJ SOLN
6.0000 mg | INTRAMUSCULAR | Status: DC
  Administered 2019-08-19 – 2019-08-21 (×3): 6 mg via INTRAVENOUS
  Filled 2019-08-18 (×3): qty 1

## 2019-08-18 MED ORDER — CARVEDILOL 3.125 MG PO TABS
3.1250 mg | ORAL_TABLET | Freq: Two times a day (BID) | ORAL | Status: DC
  Administered 2019-08-19 – 2019-08-22 (×8): 3.125 mg via ORAL
  Filled 2019-08-18 (×9): qty 1

## 2019-08-18 MED ORDER — ASCORBIC ACID 500 MG PO TABS
500.0000 mg | ORAL_TABLET | Freq: Every day | ORAL | Status: DC
  Administered 2019-08-19 – 2019-08-22 (×4): 500 mg via ORAL
  Filled 2019-08-18 (×4): qty 1

## 2019-08-18 MED ORDER — FOLIC ACID 1 MG PO TABS
1.0000 mg | ORAL_TABLET | Freq: Every day | ORAL | Status: DC
  Administered 2019-08-19 – 2019-08-22 (×4): 1 mg via ORAL
  Filled 2019-08-18 (×4): qty 1

## 2019-08-18 MED ORDER — HYDROCOD POLST-CPM POLST ER 10-8 MG/5ML PO SUER: 5.0000 mL | Freq: Two times a day (BID) | ORAL | Status: DC | PRN

## 2019-08-18 MED ORDER — GUAIFENESIN-DM 100-10 MG/5ML PO SYRP: 10.0000 mL | ORAL_SOLUTION | ORAL | Status: DC | PRN

## 2019-08-18 NOTE — Progress Notes (Signed)
ANTICOAGULATION CONSULT NOTE - Initial Consult  Pharmacy Consult for Heparin Indication: r/o VTE in COVID pt with elevated d-dimer  Allergies  Allergen Reactions  . Asa [Aspirin] Hives    Pt can tolerate 81 mg with no issues    Patient Measurements:   Ht 67.5 in Wt 79 kg  Vital Signs: Temp: 97.9 F (36.6 C) (01/26 1628) Temp Source: Oral (01/26 1628) BP: 103/77 (01/26 2300) Pulse Rate: 95 (01/26 2300)  Labs: Recent Labs    08/18/19 1640  HGB 16.1  HCT 47.6  PLT 163  CREATININE 1.06    CrCl cannot be calculated (Unknown ideal weight.).   Medical History: Past Medical History:  Diagnosis Date  . Atrial fibrillation, persistent ([place])   . Bladder cancer ([place])   . Chronic systolic CHF (congestive heart failure) ([place])   . History of blood transfusion   . Hypothyroidism   . Migraine    "years ago" (05/17/2016)  . Non-ischemic cardiomyopathy ([place])   . Thyroid disease     Medications:  See electronic med rec  Assessment: 79 y.o. M presents with COVID. Noted to have d-dimer 4.2. Pt with h/o afib but off of Eliquis since 2018 s/p catheter ablation (2017), on low dose ASA. Pharmacy consulted for heparin for r/o VTE in COVID pt with elevated d-dimer; also pt back in afib/flutter.  Goal of Therapy:  Heparin level 0.3-0.7 units/ml Monitor platelets by anticoagulation protocol: Yes   Plan:  Heparin IV bolus 4000 units Heparin gtt at 1200 units/hr Will f/u heparin level in 8  hours Daily heparin level and CBC  Sherlon Handing, PharmD, BCPS Please see amion for complete clinical pharmacist phone list 08/18/2019,11:13 PM

## 2019-08-18 NOTE — H&P (Signed)
History and Physical    Lucas Fuller [id number] DOB: Jun 01, 1941 DOA: 08/18/2019  PCP: Merrilee Seashore, MD Patient coming from: Home  Chief Complaint: Shortness of breath, cough  HPI: Lucas Fuller is a 79 y.o. 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  Also reports loss of sense of taste/smell.  Patient is not sure when his symptoms started.  He has no other complaints.  Denies fevers, chills, fatigue, nausea, vomiting, chest pain, abdominal pain, or diarrhea.  Reports drinking alcohol regularly but is not able to tell me how much.  ED Course: Afebrile.  Oxygen saturation in the low to mid 90s on room air.  Tachypneic with respiratory rate up to mid 30s.  Labs showing no leukocytosis.  AST mildly elevated at 64, remainder of LFTs normal.  BNP 303.  UA pending.  SARS-CoV-2 PCR test positive.  Influenza panel negative.  TSH level normal.  Chest x-ray personally reviewed showing mild bibasilar airspace opacities suspicious for pneumonia. Patient received Coreg 6.25 mg.  Review of Systems:  All systems reviewed and apart from history of presenting illness, are negative.  Past Medical History:  Diagnosis Date  . Atrial fibrillation, persistent ([place])   . Bladder cancer ([place])   . Chronic systolic CHF (congestive heart failure) ([place])   . History of blood transfusion   . Hypothyroidism   . Migraine    "years ago" (05/17/2016)  . Non-ischemic cardiomyopathy ([place])   . Thyroid disease     Past Surgical History:  Procedure Laterality Date  . CARDIAC CATHETERIZATION N/A 12/08/2015   Procedure: Left Heart Cath and Coronary Angiography;  Surgeon: Lorretta Harp, MD;  Location: [facility];  Service:  Cardiovascular;  Laterality: N/A;  . CYSTOSCOPY WITH FULGERATION  2008   Archie Endo 11/23/2010  . ELECTROPHYSIOLOGIC STUDY N/A 05/17/2016   Procedure: A-Flutter Ablation;  Surgeon: Evans Lance, MD;  Location: [facility];  Service: Cardiovascular;  Laterality: N/A;  . TONSILLECTOMY    . TRANSURETHRAL RESECTION OF BLADDER  2008   Archie Endo 11/23/2010     reports that he has never smoked. He has never used smokeless tobacco. He reports current alcohol use. He reports that he does not use drugs.  Allergies  Allergen Reactions  . Asa [Aspirin] Hives    Pt can tolerate 81 mg with no issues    Family History  Problem Relation Age of Onset  . Heart attack Father 43  . Kidney disease Sister   . Hypertension Sister   . Lung cancer Sister   . CVA Sister   . Diabetes Sister     Prior to Admission medications   Medication Sig Start Date End Date Taking? Authorizing Provider  aspirin EC 81 MG tablet Take 1 tablet (81 mg total) by mouth daily. 06/20/16   Evans Lance, MD  carvedilol (COREG) 6.25 MG tablet TAKE 1 TABLET BY MOUTH TWICE A DAY 07/30/19   Imogene Burn, PA-C  furosemide (LASIX) 40 MG tablet Take 1 tablet (40 mg total) by mouth daily as needed (swelling). 06/24/18   Imogene Burn, PA-C  levothyroxine (SYNTHROID, LEVOTHROID) 50 MCG tablet Take 50 mcg by mouth daily before breakfast.    [provider]  lisinopril (ZESTRIL) 2.5 MG tablet Take 2.5 mg by  mouth daily.    [provider]  potassium chloride SA (KLOR-CON M20) 20 MEQ tablet Take 1 tablet (20 mEq total) by mouth daily as needed (with lasix only). 06/24/18   Imogene Burn, PA-C    Physical Exam: Vitals:   08/18/19 2148 08/18/19 2230 08/18/19 2245 08/18/19 2300  BP: 93/70 91/70  103/77  Pulse: 76 94  95  Resp: (!) 29 (!) 31 (!) 29 (!) 31  Temp:      TempSrc:      SpO2: 95% 96%  96%    Physical Exam  Constitutional: He is oriented to person, place, and time. He appears well-developed and  well-nourished. No distress.  HENT:  Head: Normocephalic.  Eyes: Right eye exhibits no discharge. Left eye exhibits no discharge.  Cardiovascular: Normal rate, regular rhythm and intact distal pulses.  Pulmonary/Chest: He is in respiratory distress. He has no wheezes. He has no rales.  Slightly tachypneic Satting well on room air  Abdominal: Soft. Bowel sounds are normal. He exhibits no distension. There is no abdominal tenderness. There is no guarding.  Musculoskeletal:        General: No edema.     Cervical back: Neck supple.  Neurological: He is alert and oriented to person, place, and time.  Skin: Skin is warm and dry. He is not diaphoretic.     Labs on Admission: I have personally reviewed following labs and imaging studies  CBC: Recent Labs  Lab 08/18/19 1640  WBC 6.8  HGB 16.1  HCT 47.6  MCV 96.9  PLT [id number]   Basic Metabolic Panel: Recent Labs  Lab 08/18/19 1640  NA 138  K 3.7  CL 101  CO2 22  GLUCOSE 103*  BUN 12  CREATININE 1.06  CALCIUM 8.7*   GFR: CrCl cannot be calculated (Unknown ideal weight.). Liver Function Tests: Recent Labs  Lab 08/18/19 1649  AST 64*  ALT 42  ALKPHOS 42  BILITOT 0.9  PROT 8.4*  ALBUMIN 3.0*   No results for input(s): LIPASE, AMYLASE in the last 168 hours. No results for input(s): AMMONIA in the last 168 hours. Coagulation Profile: No results for input(s): INR, PROTIME in the last 168 hours. Cardiac Enzymes: No results for input(s): CKTOTAL, CKMB, CKMBINDEX, TROPONINI in the last 168 hours. BNP (last 3 results) No results for input(s): PROBNP in the last 8760 hours. HbA1C: No results for input(s): HGBA1C in the last 72 hours. CBG: No results for input(s): GLUCAP in the last 168 hours. Lipid Profile: Recent Labs    08/18/19 2135  TRIG 103   Thyroid Function Tests: Recent Labs    08/18/19 1856  TSH 3.330   Anemia Panel: Recent Labs    08/18/19 2135  FERRITIN 1,518*   Urine analysis:    Component  Value Date/Time   COLORURINE YELLOW 09/21/2015 1211   APPEARANCEUR CLEAR 09/21/2015 1211   LABSPEC 1.025 09/21/2015 1211   PHURINE 5.5 09/21/2015 1211   GLUCOSEU NEGATIVE 09/21/2015 1211   HGBUR NEGATIVE 09/21/2015 1211   BILIRUBINUR NEGATIVE 09/21/2015 1211   KETONESUR NEGATIVE 09/21/2015 1211   PROTEINUR NEGATIVE 09/21/2015 1211   UROBILINOGEN 0.2 06/22/2009 0723   NITRITE NEGATIVE 09/21/2015 1211   LEUKOCYTESUR NEGATIVE 09/21/2015 1211    Radiological Exams on Admission: DG Chest 2 View  Result Date: 08/18/2019 CLINICAL DATA:  Shortness of breath EXAM: CHEST - 2 VIEW COMPARISON:  08/17/2016 FINDINGS: Patchy airspace opacities at the bases. No pleural effusion. Mild cardiomegaly with aortic atherosclerosis. No pneumothorax. IMPRESSION: Hazy  airspace opacities at the bases, either reflecting atelectasis or mild pneumonia. Mild cardiomegaly Electronically Signed   By: Donavan Foil M.D.   On: 08/18/2019 17:18    EKG: Independently reviewed.  Initial EKG with atrial flutter, heart rate 118.  Repeat EKG with atrial fibrillation, heart rate 89.  Assessment/Plan Principal Problem:   Pneumonia due to COVID-19 virus Active Problems:   Atrial flutter (HCC)   ETOH abuse   CHF (congestive heart failure) (HCC)   Essential hypertension   Multifocal pneumonia secondary to COVID-19 viral infection Oxygen saturation in the low to mid 90s on room air.  Tachypneic with respiratory rate up to mid 30s. Chest x-ray personally reviewed showing mild bibasilar airspace opacities suspicious for pneumonia.  SARS-CoV-2 PCR test positive.  Influenza panel negative.  Inflammatory markers elevated: D-dimer 4.2, LDH 412, fibrinogen 715.  Bacterial pneumonia less likely given no leukocytosis. -Remdesivir dosing per pharmacy -IV Decadron 6 mg daily -Vitamin C, zinc -Antitussives as needed -Tylenol as needed -Inhaler as needed -CRP and ferritin level pending -Procalcitonin level pending -Daily CBC with  differential, CMP, CRP, D-dimer, LDH -CT angiogram to rule out PE given significantly elevated D-dimer.  Start heparin empirically for anticoagulation. -Airborne and contact precautions -Continuous pulse ox -Supplemental oxygen as needed to keep oxygen saturation above 90% -Blood culture x2  Atrial flutter status post ablation in 04/2016 Last EP visit July 2020.  Per notes, patient was previously on anticoagulation with Eliquis which was stopped in July 2018 and he was continued on low-dose aspirin. CHA2DS2VASc at least 4.  Currently back in Aflutter/A. fib.  Suspect related to acute illness/pneumonia.  TSH level normal.  Received a dose of Coreg in the ED, now rate controlled. -On heparin for anticoagulation.  Consult cardiology in a.m. for further recommendations. -Continue Coreg at a lower dose (3.125 mg twice daily) for rate control.  Consider increasing dose of Coreg if blood pressure is able to tolerate.  Chronic systolic congestive heart failure BNP mildly elevated.  Patient does not appear volume overloaded on exam. -Blood pressure soft.  Hold Lasix at this time.  Hypertension Blood pressure soft. -Continue low-dose Coreg for rate control for Aflutter/A. Fib Alcohol use disorder -CIWA protocol; Ativan as needed -Thiamine, folate, multivitamin  Pharmacy med rec pending.  DVT prophylaxis: Heparin Code Status: Patient wishes to be full code. Family Communication: No family available at this time. Disposition Plan: Anticipate discharge after clinical improvement. Consults called: None Admission status: It is my clinical opinion that admission to [place] is reasonable and necessary in this 79 y.o. male . presenting with multifocal pneumonia secondary to COVID-19 viral infection and atrial flutter/fibrillation.  High risk of decompensation.  Given the aforementioned, the predictability of an adverse outcome is felt to be significant. I expect that the patient will require at  least 2 midnights in the hospital to treat this condition.   The medical decision making on this patient was of high complexity and the patient is at high risk for clinical deterioration, therefore this is a level 3 visit.  Shela Leff MD [facility]  If 7PM-7AM, please contact night-coverage [email or web address] Password [facility]  08/18/2019, 11:27 PM

## 2019-08-18 NOTE — ED Notes (Signed)
Attempted to call report to [phone] 

## 2019-08-18 NOTE — ED Provider Notes (Signed)
[facility] EMERGENCY DEPARTMENT Provider Note   CSN: [id number] Arrival date & time: 08/18/19  1621     History Chief Complaint  Patient presents with  . Shortness of Breath    Lucas Fuller is a 79 y.o. male.  79yo M w/ PMH including CHF, hypothyroidism, A fib, bladder cancer who p/w feeling sick. He's been feeling badly for 2-3 weeks. Reports sore throat, cough, SOB, decreased appetite, decreased taste/smell. A few episodes of diarrhea. He has been taking alka seltzer cold. No fever, body aches, urinary symptoms, vomiting, or CP. No sick contacts, he lives alone.  The history is provided by the patient.  Shortness of Breath      Past Medical History:  Diagnosis Date  . Atrial fibrillation, persistent ([place])   . Bladder cancer ([place])   . Chronic systolic CHF (congestive heart failure) ([place])   . History of blood transfusion   . Hypothyroidism   . Migraine    "years ago" (05/17/2016)  . Non-ischemic cardiomyopathy ([place])   . Thyroid disease     Patient Active Problem List   Diagnosis Date Noted  . Essential hypertension 06/24/2018  . Malnutrition of moderate degree 12/07/2015  . Acute systolic CHF (congestive heart failure) ([place])   . Cardiomyopathy ([place])   . SOB (shortness of breath) 12/05/2015  . Atrial flutter ([place]) 12/05/2015  . Edema 12/05/2015  . Hypothyroidism 12/05/2015  . ETOH abuse 12/05/2015  . Thrombocytopenia ([place]) 12/05/2015  . Weight loss 12/05/2015  . Lumbar disc disease 12/05/2015  . Protein calorie malnutrition ([place]) 12/05/2015  . Heart failure ([place]) 12/05/2015  . Elevated brain natriuretic peptide (BNP) level   . Paroxysmal atrial fibrillation (HCC)   . Acute diastolic congestive heart failure [facility])     Past Surgical History:  Procedure Laterality Date  . CARDIAC CATHETERIZATION N/A 12/08/2015   Procedure: Left Heart Cath and Coronary Angiography;  Surgeon: Lorretta Harp, MD;  Location: [facility];  Service:  Cardiovascular;  Laterality: N/A;  . CYSTOSCOPY WITH FULGERATION  2008   Archie Endo 11/23/2010  . ELECTROPHYSIOLOGIC STUDY N/A 05/17/2016   Procedure: A-Flutter Ablation;  Surgeon: Evans Lance, MD;  Location: [facility];  Service: Cardiovascular;  Laterality: N/A;  . TONSILLECTOMY    . TRANSURETHRAL RESECTION OF BLADDER  2008   Archie Endo 11/23/2010       Family History  Problem Relation Age of Onset  . Heart attack Father 58  . Kidney disease Sister   . Hypertension Sister   . Lung cancer Sister   . CVA Sister   . Diabetes Sister     Social History   Tobacco Use  . Smoking status: Never Smoker  . Smokeless tobacco: Never Used  Substance Use Topics  . Alcohol use: Yes    Comment: 05/17/2016 "stopped drinking ~ 09/2015"  . Drug use: No    Home Medications Prior to Admission medications   Medication Sig Start Date End Date Taking? Authorizing Provider  aspirin EC 81 MG tablet Take 1 tablet (81 mg total) by mouth daily. 06/20/16   Evans Lance, MD  carvedilol (COREG) 6.25 MG tablet TAKE 1 TABLET BY MOUTH TWICE A DAY 07/30/19   Imogene Burn, PA-C  furosemide (LASIX) 40 MG tablet Take 1 tablet (40 mg total) by mouth daily as needed (swelling). 06/24/18   Imogene Burn, PA-C  levothyroxine (SYNTHROID, LEVOTHROID) 50 MCG tablet Take 50 mcg by mouth daily before breakfast.    [provider]  lisinopril (ZESTRIL) 2.5 MG tablet Take 2.5 mg by mouth daily.    [provider]  potassium chloride SA (KLOR-CON M20) 20 MEQ tablet Take 1 tablet (20 mEq total) by mouth daily as needed (with lasix only). 06/24/18   Imogene Burn, PA-C    Allergies    Asa [aspirin]  Review of Systems   Review of Systems  Respiratory: Positive for shortness of breath.   All other systems reviewed and are negative except that which was mentioned in HPI   Physical Exam Updated Vital Signs BP 119/68 (BP Location: Right Arm)   Pulse (!) 57   Temp 97.9 F (36.6 C) (Oral)    Resp 14   SpO2 94%   Physical Exam Vitals and nursing note reviewed.  Constitutional:      General: He is not in acute distress.    Appearance: He is well-developed.     Comments: Thin, chronically ill appearing  HENT:     Head: Normocephalic and atraumatic.  Eyes:     Conjunctiva/sclera: Conjunctivae normal.  Cardiovascular:     Rate and Rhythm: Normal rate and regular rhythm.     Heart sounds: Normal heart sounds. No murmur.  Pulmonary:     Effort: Tachypnea present. No accessory muscle usage.     Breath sounds: No wheezing or rales.  Abdominal:     General: Bowel sounds are normal. There is no distension.     Palpations: Abdomen is soft.     Tenderness: There is no abdominal tenderness.  Musculoskeletal:     Cervical back: Neck supple.     Right lower leg: No edema.     Left lower leg: No edema.  Skin:    General: Skin is warm and dry.  Neurological:     Mental Status: He is alert and oriented to person, place, and time.     Comments: Fluent speech  Psychiatric:        Mood and Affect: Mood normal.        Judgment: Judgment normal.     ED Results / Procedures / Treatments   Labs (all labs ordered are listed, but only abnormal results are displayed) Labs Reviewed  RESPIRATORY PANEL BY RT PCR (FLU A&B, COVID) - Abnormal; Notable for the following components:      Result Value   SARS Coronavirus 2 by RT PCR POSITIVE (*)    All other components within normal limits  BASIC METABOLIC PANEL - Abnormal; Notable for the following components:   Glucose, Bld 103 (*)    Calcium 8.7 (*)    All other components within normal limits  HEPATIC FUNCTION PANEL - Abnormal; Notable for the following components:   Total Protein 8.4 (*)    Albumin 3.0 (*)    AST 64 (*)    Bilirubin, Direct 0.3 (*)    All other components within normal limits  BRAIN NATRIURETIC PEPTIDE - Abnormal; Notable for the following components:   B Natriuretic Peptide 303.7 (*)    All other components  within normal limits  D-DIMER, QUANTITATIVE (NOT AT [facility]) - Abnormal; Notable for the following components:   D-Dimer, Quant 4.20 (*)    All other components within normal limits  LACTATE DEHYDROGENASE - Abnormal; Notable for the following components:   LDH 412 (*)    All other components within normal limits  FIBRINOGEN - Abnormal; Notable for the following components:   Fibrinogen 715 (*)    All other components within normal  limits  CBC  TSH  URINALYSIS, ROUTINE W REFLEX MICROSCOPIC  PROCALCITONIN  FERRITIN  TRIGLYCERIDES  C-REACTIVE PROTEIN    EKG None  Radiology DG Chest 2 View  Result Date: 08/18/2019 CLINICAL DATA:  Shortness of breath EXAM: CHEST - 2 VIEW COMPARISON:  08/17/2016 FINDINGS: Patchy airspace opacities at the bases. No pleural effusion. Mild cardiomegaly with aortic atherosclerosis. No pneumothorax. IMPRESSION: Hazy airspace opacities at the bases, either reflecting atelectasis or mild pneumonia. Mild cardiomegaly Electronically Signed   By: Donavan Foil M.D.   On: 08/18/2019 17:18    Procedures Procedures (including critical care time)  Medications Ordered in ED Medications  carvedilol (COREG) tablet 6.25 mg (6.25 mg Oral Given 08/18/19 1839)    ED Course  I have reviewed the triage vital signs and the nursing notes.  Pertinent labs & imaging results that were available during my care of the patient were reviewed by me and considered in my medical decision making (see chart for details).    MDM Rules/Calculators/A&P                      Pt tachypneic w/ O2 sat 91-94% on RA. Labs notable for normal TSH, +COVID-19, normal CBC and BMP. CXR w/ infiltrates c/w COVID-19. He has continued to be tachypneic w/ respiratory rate around 30, borderline O2 sats. Given his multiple comorbidities and risk for decompensation with viral infection, recommended admission. Discussed w/ [facility], Dr. Marlowe Sax; I appreciate her assistance.   BRALEN DESCHAINE was evaluated in  Emergency Department on 08/18/2019 for the symptoms described in the history of present illness. He was evaluated in the context of the global COVID-19 pandemic, which necessitated consideration that the patient might be at risk for infection with the SARS-CoV-2 virus that causes COVID-19. Institutional protocols and algorithms that pertain to the evaluation of patients at risk for COVID-19 are in a state of rapid change based on information released by regulatory bodies including the CDC and federal and state organizations. These policies and algorithms were followed during the patient's care in the ED.  Final Clinical Impression(s) / ED Diagnoses Final diagnoses:  COVID-19 virus infection    Rx / DC Orders ED Discharge Orders    None       Vondell Babers, Wenda Overland, MD 08/18/19 2243

## 2019-08-18 NOTE — ED Triage Notes (Signed)
Pt states he just feels sick. Endorses SOB and cough. Denies any CP or body aches. Lives by himself but brought in by niece.

## 2019-08-19 ENCOUNTER — Encounter (HOSPITAL_COMMUNITY): Payer: Self-pay | Admitting: Internal Medicine

## 2019-08-19 DIAGNOSIS — F1010 Alcohol abuse, uncomplicated: Secondary | ICD-10-CM

## 2019-08-19 DIAGNOSIS — I10 Essential (primary) hypertension: Secondary | ICD-10-CM

## 2019-08-19 DIAGNOSIS — I4892 Unspecified atrial flutter: Secondary | ICD-10-CM

## 2019-08-19 DIAGNOSIS — I509 Heart failure, unspecified: Secondary | ICD-10-CM

## 2019-08-19 LAB — CBC WITH DIFFERENTIAL/PLATELET
Abs Immature Granulocytes: 0.07 10*3/uL (ref 0.00–0.07)
Basophils Absolute: 0 10*3/uL (ref 0.0–0.1)
Basophils Relative: 1 %
Eosinophils Absolute: 0 10*3/uL (ref 0.0–0.5)
Eosinophils Relative: 0 %
HCT: 47.5 % (ref 39.0–52.0)
Hemoglobin: 16.3 g/dL (ref 13.0–17.0)
Immature Granulocytes: 1 %
Lymphocytes Relative: 14 %
Lymphs Abs: 1 10*3/uL (ref 0.7–4.0)
MCH: 32.9 pg (ref 26.0–34.0)
MCHC: 34.3 g/dL (ref 30.0–36.0)
MCV: 95.8 fL (ref 80.0–100.0)
Monocytes Absolute: 0.7 10*3/uL (ref 0.1–1.0)
Monocytes Relative: 9 %
Neutro Abs: 5.8 10*3/uL (ref 1.7–7.7)
Neutrophils Relative %: 75 %
Platelets: 166 10*3/uL (ref 150–400)
RBC: 4.96 MIL/uL (ref 4.22–5.81)
RDW: 13.1 % (ref 11.5–15.5)
WBC: 7.6 10*3/uL (ref 4.0–10.5)
nRBC: 0 % (ref 0.0–0.2)

## 2019-08-19 LAB — COMPREHENSIVE METABOLIC PANEL
ALT: 35 U/L (ref 0–44)
AST: 52 U/L — ABNORMAL HIGH (ref 15–41)
Albumin: 2.9 g/dL — ABNORMAL LOW (ref 3.5–5.0)
Alkaline Phosphatase: 40 U/L (ref 38–126)
Anion gap: 14 (ref 5–15)
BUN: 19 mg/dL (ref 8–23)
CO2: 22 mmol/L (ref 22–32)
Calcium: 8.5 mg/dL — ABNORMAL LOW (ref 8.9–10.3)
Chloride: 100 mmol/L (ref 98–111)
Creatinine, Ser: 1.1 mg/dL (ref 0.61–1.24)
GFR calc Af Amer: >60 mL/min (ref >60)
GFR calc non Af Amer: >60 mL/min (ref >60)
Glucose, Bld: 118 mg/dL — ABNORMAL HIGH (ref 70–99)
Potassium: 3.9 mmol/L (ref 3.5–5.1)
Sodium: 136 mmol/L (ref 135–145)
Total Bilirubin: 0.8 mg/dL (ref 0.3–1.2)
Total Protein: 7.7 g/dL (ref 6.5–8.1)

## 2019-08-19 LAB — D-DIMER, QUANTITATIVE: D-Dimer, Quant: 3.71 ug/mL-FEU — ABNORMAL HIGH (ref 0.00–0.50)

## 2019-08-19 LAB — ABO/RH: ABO/RH(D): AB POS

## 2019-08-19 LAB — C-REACTIVE PROTEIN: CRP: 16 mg/dL — ABNORMAL HIGH (ref <1.0)

## 2019-08-19 LAB — LACTATE DEHYDROGENASE: LDH: 386 U/L — ABNORMAL HIGH (ref 98–192)

## 2019-08-19 LAB — MAGNESIUM: Magnesium: 2.3 mg/dL (ref 1.7–2.4)

## 2019-08-19 LAB — HEPARIN LEVEL (UNFRACTIONATED): Heparin Unfractionated: 0.52 IU/mL (ref 0.30–0.70)

## 2019-08-19 LAB — PHOSPHORUS: Phosphorus: 4.1 mg/dL (ref 2.5–4.6)

## 2019-08-19 MED ORDER — LEVOTHYROXINE SODIUM 50 MCG PO TABS
50.0000 ug | ORAL_TABLET | Freq: Every day | ORAL | Status: DC
  Administered 2019-08-19 – 2019-08-22 (×4): 50 ug via ORAL
  Filled 2019-08-19 (×4): qty 1

## 2019-08-19 MED ORDER — ENSURE ENLIVE PO LIQD
237.0000 mL | Freq: Three times a day (TID) | ORAL | Status: DC
  Administered 2019-08-19 – 2019-08-22 (×9): 237 mL via ORAL

## 2019-08-19 MED ORDER — HEPARIN SODIUM (PORCINE) 5000 UNIT/ML IJ SOLN
5000.0000 [IU] | Freq: Three times a day (TID) | INTRAMUSCULAR | Status: DC
  Administered 2019-08-19 – 2019-08-22 (×10): 5000 [IU] via SUBCUTANEOUS
  Filled 2019-08-19 (×10): qty 1

## 2019-08-19 MED ORDER — ENSURE ENLIVE PO LIQD
237.0000 mL | Freq: Two times a day (BID) | ORAL | Status: DC
  Administered 2019-08-19: 237 mL via ORAL

## 2019-08-19 NOTE — Progress Notes (Signed)
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  On arrival to ED his oxygen sats low 90s on room air, tachypneic and his SARS Covid PCR test is positive.  He was admitted for evaluation and management of COVID-19 pneumonia.  Assessment & Plan:   Principal Problem:   Pneumonia due to COVID-19 virus Active Problems:   Atrial flutter (HCC)   ETOH abuse   CHF (congestive heart failure) (HCC)   Essential hypertension   Multifocal pneumonia secondary to COVID-19 viral infection. Patient continues to be tachypneic currently on room air with oxygen sats in low 90s. Follow inflammatory markers. Continue with remdesivir and IV Decadron. CT angiogram is negative for PE. IV heparin discontinued. Nasal cannula oxygen to see if keep sats greater than 90%.    Atrial flutter s/p ablation in 2017. Eliquis was stopped in 2018 and aspirin was started. Rate controlled  Chronic systolic heart failure He appears euvolemic Echocardiogram shows There was moderate concentric hypertrophy. Systolic function was mildly to moderately reduced. The estimated   ejection fraction was in the range of 40% to 45%. Diffuse   hypokinesis. Doppler parameters are consistent with abnormal left ventricular relaxation (grade 1   diastolic dysfunction).     EtOH abuse Continue with CIWA protocol. No signs of withdrawal at this time.    Essential hypertension Blood pressure parameters are well controlled.     DVT prophylaxis:  Code Status: Full code Family Communication: None at bedside Disposition Plan:  . Patient came  from: Home           . Anticipated d/c place: Pending PT evaluation Barriers to d/c OR conditions which need to be met to effect a safe d/c: Pending clinical improvement,   Consultants:   None  Procedures: None Antimicrobials: None  Subjective: Patient denies any chest pain. Nausea, vomiting.  Patient continues to be tachypneic and coughing while talking.  Objective: Vitals:   08/19/19 0100 08/19/19 0200 08/19/19 0600 08/19/19 1450  BP: 101/79 94/70 102/73 107/87  Pulse: 83 92 87 83  Resp:    (!) 22  Temp:    97.6 F (36.4 C)  TempSrc:    Oral  SpO2:    95%  Weight:      Height:        Intake/Output Summary (Last 24 hours) at 08/19/2019 1603 Last data filed at 08/19/2019 1450 Gross per 24 hour  Intake 719.39 ml  Output --  Net 719.39 ml   Filed Weights   08/19/19 0000  Weight: 68.7 kg    Examination:  General exam: Appears calm and comfortable  Respiratory system: Clear to auscultation. Respiratory effort normal. Cardiovascular system: S1 & S2 heard, irregularly irregular no JVD,  No pedal edema. Gastrointestinal system: Abdomen is nondistended, soft and nontender.  Normal bowel sounds heard. Central nervous system: Alert and oriented. No focal neurological deficits. Extremities: Symmetric 5 x 5 power. Skin: No rashes, lesions or ulcers Psychiatry: . Mood & affect appropriate.     Data Reviewed: I have personally reviewed following labs and imaging studies  CBC: Recent Labs  Lab 08/18/19 1640 08/19/19 0157  WBC 6.8 7.6  NEUTROABS  --  5.8  HGB  16.1 16.3  HCT 47.6 47.5  MCV 96.9 95.8  PLT 163 630   Basic Metabolic Panel: Recent Labs  Lab 08/18/19 1640 08/19/19 0157  NA 138 136  K 3.7 3.9  CL 101 100  CO2 22 22  GLUCOSE 103* 118*  BUN 12 19  CREATININE 1.06 1.10  CALCIUM 8.7* 8.5*  MG  --  2.3  PHOS  --  4.1   GFR: Estimated Creatinine Clearance: 51.8 mL/min (by C-G formula based on SCr of 1.1 mg/dL). Liver Function Tests: Recent Labs   Lab 08/18/19 1649 08/19/19 0157  AST 64* 52*  ALT 42 35  ALKPHOS 42 40  BILITOT 0.9 0.8  PROT 8.4* 7.7  ALBUMIN 3.0* 2.9*   No results for input(s): LIPASE, AMYLASE in the last 168 hours. No results for input(s): AMMONIA in the last 168 hours. Coagulation Profile: No results for input(s): INR, PROTIME in the last 168 hours. Cardiac Enzymes: No results for input(s): CKTOTAL, CKMB, CKMBINDEX, TROPONINI in the last 168 hours. BNP (last 3 results) No results for input(s): PROBNP in the last 8760 hours. HbA1C: No results for input(s): HGBA1C in the last 72 hours. CBG: No results for input(s): GLUCAP in the last 168 hours. Lipid Profile: Recent Labs    08/18/19 2135  TRIG 103   Thyroid Function Tests: Recent Labs    08/18/19 1856  TSH 3.330   Anemia Panel: Recent Labs    08/18/19 2135  FERRITIN 1,518*   Sepsis Labs: Recent Labs  Lab 08/18/19 2135  PROCALCITON <0.10    Recent Results (from the past 240 hour(s))  Respiratory Panel by RT PCR (Flu A&B, Covid) - Nasopharyngeal Swab     Status: Abnormal   Collection Time: 08/18/19  6:11 PM   Specimen: Nasopharyngeal Swab  Result Value Ref Range Status   SARS Coronavirus 2 by RT PCR POSITIVE (A) NEGATIVE Final    Comment: RESULT CALLED TO, READ BACK BY AND VERIFIED WITH: A CAIN RN 1915 08/18/19 A BROWNING (NOTE) SARS-CoV-2 target nucleic acids are DETECTED. SARS-CoV-2 RNA is generally detectable in upper respiratory specimens  during the acute phase of infection. Positive results are indicative of the presence of the identified virus, but do not rule out bacterial infection or co-infection with other pathogens not detected by the test. Clinical correlation with patient history and other diagnostic information is necessary to determine patient infection status. The expected result is Negative. Fact Sheet for Patients:  PinkCheek.be Fact Sheet for Healthcare  Providers: GravelBags.it This test is not yet approved or cleared by the [place] FDA and  has been authorized for detection and/or diagnosis of SARS-CoV-2 by FDA under an Emergency Use Authorization (EUA).  This EUA will remain in effect (meaning this test can be used) for  the duration of  the COVID-19 declaration under Section 564(b)(1) of the Act, 21 U.S.C. section 360bbb-3(b)(1), unless the authorization is terminated or revoked sooner.    Influenza A by PCR NEGATIVE NEGATIVE Final   Influenza B by PCR NEGATIVE NEGATIVE Final    Comment: (NOTE) The Xpert Xpress SARS-CoV-2/FLU/RSV assay is intended as an aid in  the diagnosis of influenza from Nasopharyngeal swab specimens and  should not be used as a sole basis for treatment. Nasal washings and  aspirates are unacceptable for Xpert Xpress SARS-CoV-2/FLU/RSV  testing. Fact Sheet for Patients: PinkCheek.be Fact Sheet for Healthcare Providers: GravelBags.it This test is not yet approved or cleared by the [place] and  has been authorized for  detection and/or diagnosis of SARS-CoV-2 by  FDA under an Emergency Use Authorization (EUA). This EUA will remain  in effect (meaning this test can be used) for the duration of the  Covid-19 declaration under Section 564(b)(1) of the Act, 21  U.S.C. section 360bbb-3(b)(1), unless the authorization is  terminated or revoked. Performed at [facility], [place] [address] [place], [place] [address]   Culture, blood (Routine X 2) w Reflex to ID Panel     Status: None (Preliminary result)   Collection Time: 08/19/19  1:57 AM   Specimen: BLOOD  Result Value Ref Range Status   Specimen Description BLOOD RIGHT ANTECUBITAL  Final   Special Requests   Final    BOTTLES DRAWN AEROBIC AND ANAEROBIC Blood Culture adequate volume   Culture   Final    NO GROWTH < 12 HOURS Performed at [facility], [place] [address], [place], [place] [address]    Report Status PENDING  Incomplete  Culture, blood (Routine X 2) w Reflex to ID Panel     Status: None (Preliminary result)   Collection Time: 08/19/19  2:14 AM   Specimen: BLOOD  Result Value Ref Range Status   Specimen Description BLOOD RIGHT ANTECUBITAL  Final   Special Requests   Final    BOTTLES DRAWN AEROBIC AND ANAEROBIC Blood Culture adequate volume   Culture   Final    NO GROWTH < 12 HOURS Performed at [facility], [place] [address], [place], [place] [address]    Report Status PENDING  Incomplete         Radiology Studies: DG Chest 2 View  Result Date: 08/18/2019 CLINICAL DATA:  Shortness of breath EXAM: CHEST - 2 VIEW COMPARISON:  08/17/2016 FINDINGS: Patchy airspace opacities at the bases. No pleural effusion. Mild cardiomegaly with aortic atherosclerosis. No pneumothorax. IMPRESSION: Hazy airspace opacities at the bases, either reflecting atelectasis or mild pneumonia. Mild cardiomegaly Electronically Signed   By: Donavan Foil M.D.   On: 08/18/2019 17:18   CT ANGIO CHEST PE W OR WO CONTRAST  Result Date: 08/18/2019 CLINICAL DATA:  Shortness of breath and cough, COVID positive with elevated D-dimer EXAM: CT ANGIOGRAPHY CHEST WITH CONTRAST TECHNIQUE: Multidetector CT imaging of the chest was performed using the standard protocol during bolus administration of intravenous contrast. Multiplanar CT image reconstructions and MIPs were obtained to evaluate the vascular anatomy. CONTRAST:  80m OMNIPAQUE IOHEXOL 350 MG/ML SOLN COMPARISON:  Chest x-ray 126 2021 FINDINGS: Cardiovascular: Satisfactory opacification of the pulmonary arteries to the segmental level. No evidence of pulmonary embolism. Nonaneurysmal aorta. Moderate aortic atherosclerosis. Mild cardiomegaly. No pericardial effusion. Reflux of contrast into the hepatic veins consistent with elevated right heart pressures. Mediastinum/Nodes: No enlarged mediastinal,  hilar, or axillary lymph nodes. Thyroid gland, trachea, and esophagus demonstrate no significant findings. Lungs/Pleura: Multifocal bilateral predominantly subpleural/peripheral foci of ground-glass density. No pleural effusion or pneumothorax. Upper Abdomen: No acute abnormality. Musculoskeletal: Degenerative changes. No acute or suspicious osseous abnormality Review of the MIP images confirms the above findings. IMPRESSION: 1. Negative for acute pulmonary embolus. 2. Multifocal bilateral mostly peripheral ground-glass densities, consistent with bilateral pneumonia and history of COVID positivity. 3. Mild cardiomegaly. Reflux of contrast into the hepatic veins consistent with elevated right heart pressures. Aortic Atherosclerosis (ICD10-I70.0). Electronically Signed   By: KDonavan FoilM.D.   On: 08/18/2019 23:41        Scheduled Meds: . vitamin C  500 mg Oral Daily  . carvedilol  3.125  mg Oral BID WC  . dexamethasone (DECADRON) injection  6 mg Intravenous Q24H  . feeding supplement (ENSURE ENLIVE)  237 mL Oral TID BM  . folic acid  1 mg Oral Daily  . heparin injection (subcutaneous)  5,000 Units Subcutaneous Q8H  . levothyroxine  50 mcg Oral Q0600  . multivitamin with minerals  1 tablet Oral Daily  . thiamine  100 mg Oral Daily   Or  . thiamine  100 mg Intravenous Daily  . zinc sulfate  220 mg Oral Daily   Continuous Infusions: . remdesivir 100 mg in NS 100 mL 100 mg (08/19/19 1601)     LOS: 1 day    Time spent: 32 minutes    Hosie Poisson, MD [facility]   To contact the attending provider between 7A-7P or the covering provider during after hours 7P-7A, please log into the web site [email or web address] and access using universal [place] password for that web site. If you do not have the password, please call the hospital operator.  08/19/2019, 4:03 PM

## 2019-08-19 NOTE — Progress Notes (Signed)
Updated family member Felicia patient niece. Informed patient of plan of care and current health status.

## 2019-08-19 NOTE — Progress Notes (Signed)
ANTICOAGULATION CONSULT NOTE - Follow Up Consult  Pharmacy Consult for Heparin Indication: atrial fibrillation  Allergies  Allergen Reactions  . Asa [Aspirin] Hives    Pt can tolerate 81 mg with no issues    Patient Measurements: Height: 5' 7.52" (171.5 cm) Weight: 151 lb 7.3 oz (68.7 kg) IBW/kg (Calculated) : 67.3 Heparin Dosing Weight: 68.7  Vital Signs: Temp: 98.4 F (36.9 C) (01/26 2357) Temp Source: Oral (01/26 2357) BP: 102/73 (01/27 0600) Pulse Rate: 87 (01/27 0600)  Labs: Recent Labs    08/18/19 1640 08/19/19 0157 08/19/19 0751  HGB 16.1 16.3  --   HCT 47.6 47.5  --   PLT 163 166  --   HEPARINUNFRC  --   --  0.52  CREATININE 1.06 1.10  --     Estimated Creatinine Clearance: 51.8 mL/min (by C-G formula based on SCr of 1.1 mg/dL).  Assessment:  Anticoag:  d-dimer 4.2>3.71. Pt with h/o afib but off of Eliquis since 2018 s/p catheter ablation, on low dose ASA. Heparin for r/o VTE (CT 1/26 negative) but  also pt back in afib/flutter. - Hep level 0.52 in goal. CBC WNL. Fibrinogen 715  Goal of Therapy:  Heparin level 0.3-0.7 units/ml Monitor platelets by anticoagulation protocol: Yes   Plan:  Heparin gtt at 1200 units/hr Daily heparin level and CBC  Lucas Fuller [place], PharmD, BCPS Clinical Staff Pharmacist [email or web address] Fuller [place], [employer] 08/19/2019,9:50 AM

## 2019-08-19 NOTE — Progress Notes (Signed)
Updated family member niece Alen Blew, all questions answered and discuss current plan of care.Family appreciative and willing to participate in plan.

## 2019-08-19 NOTE — Progress Notes (Signed)
Initial Nutrition Assessment  RD working remotely.  DOCUMENTATION CODES:   Not applicable, suspect malnutrition but unable to confirm at this time  INTERVENTION:   - Liberalize diet to Regular, verbal with readback order placed per MD  - Ensure Enlive po TID, each supplement provides 350 kcal and 20 grams of protein  - Continue MVI with minerals daily  NUTRITION DIAGNOSIS:   Increased nutrient needs related to acute illness, catabolic illness ([id number] pneumonia) as evidenced by estimated needs.  GOAL:   Patient will meet greater than or equal to 90% of their needs  MONITOR:   PO intake, Supplement acceptance, Labs, Weight trends, I & O's  REASON FOR ASSESSMENT:   Malnutrition Screening Tool    ASSESSMENT:   79 year old male who presented to the ED on 1/26 with SOB and cough. PMH of CHF, HTN, EtOH abuse, hypothyroidism, atrial fibrillation, bladder cancer s/p TURB. Pt tested positive for COVID-19 and admitted with pneumonia.   Pt currently on a Heart Healthy diet. Discussed diet liberalization with MD who agreed. Regular diet ordered.  Unable to reach pt via phone call to room.  Reviewed weight history in chart. Pt with a 10.5 kg weight loss since 02/05/19. This is a 13.3% weight loss in 6 months which is significant for timeframe.  Suspect some degree of malnutrition but RD unable to confirm without NFPE or diet history.  Will increase Ensure Enlive to TID and continue with daily MVI.  Meal Completion: 50% x 1 meal  Medications reviewed and include: vitamin C, decadron, Ensure Enlive BID, folic acid, MVI with minerals, thiamine, zinc sulfate, remdesivir  Labs reviewed.  NUTRITION - FOCUSED PHYSICAL EXAM:  Unable to complete at this time. RD working remotely.  Diet Order:   Diet Order            Diet regular Room service appropriate? Yes; Fluid consistency: Thin  Diet effective now              EDUCATION NEEDS:   No education needs have been  identified at this time  Skin:  Skin Assessment: Reviewed RN Assessment  Last BM:  08/19/19 medium type 7  Height:   Ht Readings from Last 1 Encounters:  08/19/19 5' 7.52" (1.715 m)    Weight:   Wt Readings from Last 1 Encounters:  08/19/19 68.7 kg    Ideal Body Weight:  68.7 kg  BMI:  Body mass index is 23.36 kg/m.  Estimated Nutritional Needs:   Kcal:  [id number]  Protein:  85-100 grams  Fluid:  >/= 1.7 L    Gaynell Face, MS, RD, LDN Inpatient Clinical Dietitian Pager: [phone] Weekend/After Hours: [phone]

## 2019-08-20 LAB — CBC WITH DIFFERENTIAL/PLATELET
Abs Immature Granulocytes: 0.05 10*3/uL (ref 0.00–0.07)
Basophils Absolute: 0 10*3/uL (ref 0.0–0.1)
Basophils Relative: 0 %
Eosinophils Absolute: 0 10*3/uL (ref 0.0–0.5)
Eosinophils Relative: 0 %
HCT: 44.2 % (ref 39.0–52.0)
Hemoglobin: 15.4 g/dL (ref 13.0–17.0)
Immature Granulocytes: 1 %
Lymphocytes Relative: 9 %
Lymphs Abs: 0.9 10*3/uL (ref 0.7–4.0)
MCH: 32.9 pg (ref 26.0–34.0)
MCHC: 34.8 g/dL (ref 30.0–36.0)
MCV: 94.4 fL (ref 80.0–100.0)
Monocytes Absolute: 0.5 10*3/uL (ref 0.1–1.0)
Monocytes Relative: 5 %
Neutro Abs: 8.2 10*3/uL — ABNORMAL HIGH (ref 1.7–7.7)
Neutrophils Relative %: 85 %
Platelets: 194 10*3/uL (ref 150–400)
RBC: 4.68 MIL/uL (ref 4.22–5.81)
RDW: 13.3 % (ref 11.5–15.5)
WBC: 9.7 10*3/uL (ref 4.0–10.5)
nRBC: 0 % (ref 0.0–0.2)

## 2019-08-20 LAB — COMPREHENSIVE METABOLIC PANEL
ALT: 36 U/L (ref 0–44)
AST: 50 U/L — ABNORMAL HIGH (ref 15–41)
Albumin: 2.5 g/dL — ABNORMAL LOW (ref 3.5–5.0)
Alkaline Phosphatase: 40 U/L (ref 38–126)
Anion gap: 12 (ref 5–15)
BUN: 37 mg/dL — ABNORMAL HIGH (ref 8–23)
CO2: 24 mmol/L (ref 22–32)
Calcium: 8.8 mg/dL — ABNORMAL LOW (ref 8.9–10.3)
Chloride: 103 mmol/L (ref 98–111)
Creatinine, Ser: 0.96 mg/dL (ref 0.61–1.24)
GFR calc Af Amer: >60 mL/min (ref >60)
GFR calc non Af Amer: >60 mL/min (ref >60)
Glucose, Bld: 147 mg/dL — ABNORMAL HIGH (ref 70–99)
Potassium: 3.7 mmol/L (ref 3.5–5.1)
Sodium: 139 mmol/L (ref 135–145)
Total Bilirubin: 0.2 mg/dL — ABNORMAL LOW (ref 0.3–1.2)
Total Protein: 7.3 g/dL (ref 6.5–8.1)

## 2019-08-20 LAB — C-REACTIVE PROTEIN: CRP: 11.7 mg/dL — ABNORMAL HIGH (ref <1.0)

## 2019-08-20 LAB — LACTATE DEHYDROGENASE: LDH: 325 U/L — ABNORMAL HIGH (ref 98–192)

## 2019-08-20 LAB — D-DIMER, QUANTITATIVE: D-Dimer, Quant: 1.03 ug/mL-FEU — ABNORMAL HIGH (ref 0.00–0.50)

## 2019-08-20 LAB — HEPARIN LEVEL (UNFRACTIONATED): Heparin Unfractionated: <0.1 IU/mL — ABNORMAL LOW (ref 0.30–0.70)

## 2019-08-20 NOTE — TOC Initial Note (Signed)
Transition of Care [facility]) - Initial/Assessment Note    Patient Details  Name: Lucas Fuller MRN: [id number] Date of Birth: Jul 03, 1941  Transition of Care [facility]) CM/SW Contact:    Ralph Dowdy, [place] Work Phone Number: 08/20/2019, 11:31 AM  Clinical Narrative:                 MSW spoke with pt's niece Hassan Rowan. MSW received notification that she wanted information on pt's discharge planning. MSW explained that PT has not seen patient yet, however, depending on their recommendation he would either need rehab/SNF or home health services. Hassan Rowan stated acknowledgement and did not mention anything about the pt not being able to return back home. Hassan Rowan did state that she needed keys from pt. MSW stated to call nurses station and that they would supply her with keys. MSW verified that she had nurses stations number. MSW and supervisor will continue to follow up when PT evaluates pt.        Patient Goals and CMS Choice        Expected Discharge Plan and Services                                                Prior Living Arrangements/Services                       Activities of Daily Living Home Assistive Devices/Equipment: Cane (specify quad or straight) ADL Screening (condition at time of admission) Patient's cognitive ability adequate to safely complete daily activities?: Yes Is the patient deaf or have difficulty hearing?: No Does the patient have difficulty seeing, even when wearing glasses/contacts?: No Does the patient have difficulty concentrating, remembering, or making decisions?: No Patient able to express need for assistance with ADLs?: Yes Does the patient have difficulty dressing or bathing?: No Independently performs ADLs?: Yes (appropriate for developmental age) Does the patient have difficulty walking or climbing stairs?: No Weakness of Legs: Both Weakness of Arms/Hands: Both  Permission Sought/Granted                  Emotional  Assessment              Admission diagnosis:  COVID-19 virus infection [U07.1] Pneumonia due to COVID-19 virus [U07.1, J12.82] Patient Active Problem List   Diagnosis Date Noted  . Pneumonia due to COVID-19 virus 08/18/2019  . Essential hypertension 06/24/2018  . Malnutrition of moderate degree 12/07/2015  . Acute systolic CHF (congestive heart failure) ([place])   . Cardiomyopathy ([place])   . SOB (shortness of breath) 12/05/2015  . Atrial flutter ([place]) 12/05/2015  . Edema 12/05/2015  . Hypothyroidism 12/05/2015  . ETOH abuse 12/05/2015  . Thrombocytopenia ([place]) 12/05/2015  . Weight loss 12/05/2015  . Lumbar disc disease 12/05/2015  . Protein calorie malnutrition ([place]) 12/05/2015  . CHF (congestive heart failure) ([place]) 12/05/2015  . Elevated brain natriuretic peptide (BNP) level   . Paroxysmal atrial fibrillation (HCC)   . Acute diastolic congestive heart failure ([place])    PCP:  Merrilee Seashore, MD Pharmacy:   [id number] - [place], [place]. AT [address] [place] [address] Phone: [phone] Fax: [phone]     Social Determinants of Health (SDOH) Interventions    Readmission Risk Interventions No flowsheet data found.

## 2019-08-20 NOTE — Progress Notes (Signed)
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  On arrival to ED his oxygen sats low 90s on room air, tachypneic and his SARS Covid PCR test is positive.  He was admitted for evaluation and management of COVID-19 pneumonia. Pt seen and examined at bedside, as per the niece pt does not have the means for transport from and to the infusion center for remdesivir and PT/OT evaluation.   Assessment & Plan:   Principal Problem:   Pneumonia due to COVID-19 virus Active Problems:   Atrial flutter (HCC)   ETOH abuse   CHF (congestive heart failure) (HCC)   Essential hypertension   Multifocal pneumonia secondary to COVID-19 viral infection. Slowly improving, cough is better tachypnea is better patient remains on room air with oxygen sats in low 90s. LDH is 325, CRP has improved to 11.7, D-dimer is 1. Continue with remdesivir and IV Decadron to complete the course CT angiogram of the chest is negative for PE. IV heparin discontinued. Nasal cannula oxygen to keep sats greater than 90%. Recommend PT/OT evaluation for disposition.   Atrial flutter s/p ablation in 2017. Eliquis was stopped in 2018 and aspirin was started.  Discussed with the niece and it seems that anticoagulation was stopped as patient was having frequent falls at home. Rate controlled.  family did not want to be started on anticoagulation at this time.  Chronic systolic heart failure He appears euvolemic Echocardiogram shows There was moderate concentric hypertrophy. Systolic function was mildly to moderately reduced. The estimated   ejection fraction was in  the range of 40% to 45%. Diffuse   hypokinesis. Doppler parameters are consistent with abnormal left ventricular relaxation (grade 1   diastolic dysfunction). No changes in medications.  Continue with Coreg     EtOH abuse Continue with CIWA protocol. No signs in withdrawal.    Essential hypertension Blood pressure parameters are well controlled  Hypothyroidism Continue with Synthroid 50 MCG daily.   DVT prophylaxis:  Code Status: Full code Family Communication: None at bedside Disposition Plan:  . Patient came from: Home           . Anticipated d/c place: Pending PT evaluation Barriers to d/c OR conditions which need to be met to effect a safe d/c: Pending clinical improvement, and PT evaluation  Consultants:   None  Procedures: None Antimicrobials: None  Subjective: Patient denies any chest pain or shortness of breath. No nausea vomiting or diarrhea or abdominal pain  Objective: Vitals:   08/19/19 1450 08/19/19 2000 08/19/19 2112 08/20/19 0551  BP: 107/87 97/75  112/82  Pulse: 83 86  64  Resp: (!) 22 18    Temp: 97.6 F (36.4 C)  97.6 F (36.4 C) (!) 97.5 F (36.4 C)  TempSrc: Oral  Axillary Axillary  SpO2: 95%     Weight:      Height:        Intake/Output Summary (Last 24 hours) at 08/20/2019 1432 Last data filed at 08/20/2019 0200 Gross per 24 hour  Intake 597 ml  Output --  Net 597 ml   Filed Weights   08/19/19 0000  Weight: 68.7 kg    Examination:  General exam: Alert  and comfortable, on room air Respiratory system: Diminished air entry at bases, no wheezing or rhonchi Cardiovascular system: S1-S2 heard, irregularly irregular, no JVD, no pedal edema Gastrointestinal system: Abdomen is soft, nontender, nondistended, bowel sounds normal Central nervous system: Alert and oriented, grossly nonfocal Extremities: No cyanosis or clubbing Skin: No rashes Psychiatry: .  Mood is appropriate  Data Reviewed: I have personally reviewed following  labs and imaging studies  CBC: Recent Labs  Lab 08/18/19 1640 08/19/19 0157 08/20/19 0326  WBC 6.8 7.6 9.7  NEUTROABS  --  5.8 8.2*  HGB 16.1 16.3 15.4  HCT 47.6 47.5 44.2  MCV 96.9 95.8 94.4  PLT 163 166 321   Basic Metabolic Panel: Recent Labs  Lab 08/18/19 1640 08/19/19 0157 08/20/19 0326  NA 138 136 139  K 3.7 3.9 3.7  CL 101 100 103  CO2 _0 GLUCOSE 103* 118* 147*  BUN 12 19 37*  CREATININE 1.06 1.10 0.96  CALCIUM 8.7* 8.5* 8.8*  MG  --  2.3  --   PHOS  --  4.1  --    GFR: Estimated Creatinine Clearance: 59.4 mL/min (by C-G formula based on SCr of 0.96 mg/dL). Liver Function Tests: Recent Labs  Lab 08/18/19 1649 08/19/19 0157 08/20/19 0326  AST 64* 52* 50*  ALT 42 35 36  ALKPHOS 42 40 40  BILITOT 0.9 0.8 0.2*  PROT 8.4* 7.7 7.3  ALBUMIN 3.0* 2.9* 2.5*   No results for input(s): LIPASE, AMYLASE in the last 168 hours. No results for input(s): AMMONIA in the last 168 hours. Coagulation Profile: No results for input(s): INR, PROTIME in the last 168 hours. Cardiac Enzymes: No results for input(s): CKTOTAL, CKMB, CKMBINDEX, TROPONINI in the last 168 hours. BNP (last 3 results) No results for input(s): PROBNP in the last 8760 hours. HbA1C: No results for input(s): HGBA1C in the last 72 hours. CBG: No results for input(s): GLUCAP in the last 168 hours. Lipid Profile: Recent Labs    08/18/19 2135  TRIG 103   Thyroid Function Tests: Recent Labs    08/18/19 1856  TSH 3.330   Anemia Panel: Recent Labs    08/18/19 2135  FERRITIN 1,518*   Sepsis Labs: Recent Labs  Lab 08/18/19 2135  PROCALCITON <0.10    Recent Results (from the past 240 hour(s))  Respiratory Panel by RT PCR (Flu A&B, Covid) - Nasopharyngeal Swab     Status: Abnormal   Collection Time: 08/18/19  6:11 PM   Specimen: Nasopharyngeal Swab  Result Value Ref Range Status   SARS Coronavirus 2 by RT PCR POSITIVE (A) NEGATIVE Final    Comment: RESULT CALLED TO, READ BACK BY  AND VERIFIED WITH: A CAIN RN 1915 08/18/19 A BROWNING (NOTE) SARS-CoV-2 target nucleic acids are DETECTED. SARS-CoV-2 RNA is generally detectable in upper respiratory specimens  during the acute phase of infection. Positive results are indicative of the presence of the identified virus, but do not rule out bacterial infection or co-infection with other pathogens not detected by the test. Clinical correlation with patient history and other diagnostic information is necessary to determine patient infection status. The expected result is Negative. Fact Sheet for Patients:  PinkCheek.be Fact Sheet for Healthcare Providers: GravelBags.it This test is not yet approved or cleared by the [place] FDA and  has been authorized for detection and/or diagnosis of SARS-CoV-2 by FDA under an Emergency Use Authorization (EUA).  This EUA will remain in effect (meaning this test can be used) for  the duration of  the COVID-19 declaration under Section 564(b)(1) of the Act, 21 U.S.C. section 360bbb-3(b)(1), unless the authorization is terminated or revoked sooner.    Influenza A by PCR NEGATIVE NEGATIVE Final   Influenza B by PCR NEGATIVE NEGATIVE Final    Comment: (NOTE) The Xpert Xpress SARS-CoV-2/FLU/RSV assay is intended as an aid in  the diagnosis of influenza from Nasopharyngeal swab specimens and  should not be used as a sole basis for treatment. Nasal washings and  aspirates are unacceptable for Xpert Xpress SARS-CoV-2/FLU/RSV  testing. Fact Sheet for Patients: PinkCheek.be Fact Sheet for Healthcare Providers: GravelBags.it This test is not yet approved or cleared by the [place] FDA and  has been authorized for detection and/or diagnosis of SARS-CoV-2 by  FDA under an Emergency Use Authorization (EUA). This EUA will remain  in effect (meaning this test can be used)  for the duration of the  Covid-19 declaration under Section 564(b)(1) of the Act, 21  U.S.C. section 360bbb-3(b)(1), unless the authorization is  terminated or revoked. Performed at [facility], [place] [address], [place], [place] [address]   Culture, blood (Routine X 2) w Reflex to ID Panel     Status: None (Preliminary result)   Collection Time: 08/19/19  1:57 AM   Specimen: BLOOD  Result Value Ref Range Status   Specimen Description BLOOD RIGHT ANTECUBITAL  Final   Special Requests   Final    BOTTLES DRAWN AEROBIC AND ANAEROBIC Blood Culture adequate volume   Culture   Final    NO GROWTH 1 DAY Performed at [facility], [place] [address], [place], [place] [address]    Report Status PENDING  Incomplete  Culture, blood (Routine X 2) w Reflex to ID Panel     Status: None (Preliminary result)   Collection Time: 08/19/19  2:14 AM   Specimen: BLOOD  Result Value Ref Range Status   Specimen Description BLOOD RIGHT ANTECUBITAL  Final   Special Requests   Final    BOTTLES DRAWN AEROBIC AND ANAEROBIC Blood Culture adequate volume   Culture   Final    NO GROWTH 1 DAY Performed at [facility], [place] [address], [place], [place] [address]    Report Status PENDING  Incomplete         Radiology Studies: DG Chest 2 View  Result Date: 08/18/2019 CLINICAL DATA:  Shortness of breath EXAM: CHEST - 2 VIEW COMPARISON:  08/17/2016 FINDINGS: Patchy airspace opacities at the bases. No pleural effusion. Mild cardiomegaly with aortic atherosclerosis. No pneumothorax. IMPRESSION: Hazy airspace opacities at the bases, either reflecting atelectasis or mild pneumonia. Mild cardiomegaly Electronically Signed   By: Donavan Foil M.D.   On: 08/18/2019 17:18   CT ANGIO CHEST PE W OR WO CONTRAST  Result Date: 08/18/2019 CLINICAL DATA:  Shortness of breath and cough, COVID positive with elevated D-dimer EXAM: CT ANGIOGRAPHY CHEST WITH CONTRAST TECHNIQUE: Multidetector CT imaging of the chest  was performed using the standard protocol during bolus administration of intravenous contrast. Multiplanar CT image reconstructions and MIPs were obtained to evaluate the vascular anatomy. CONTRAST:  22m OMNIPAQUE IOHEXOL 350 MG/ML SOLN COMPARISON:  Chest x-ray 126 2021 FINDINGS: Cardiovascular: Satisfactory opacification of the pulmonary arteries to the segmental level. No evidence of pulmonary embolism. Nonaneurysmal aorta. Moderate aortic atherosclerosis. Mild cardiomegaly. No pericardial effusion. Reflux of contrast into the hepatic veins consistent with elevated right heart pressures. Mediastinum/Nodes: No enlarged mediastinal, hilar, or axillary lymph nodes. Thyroid gland,  trachea, and esophagus demonstrate no significant findings. Lungs/Pleura: Multifocal bilateral predominantly subpleural/peripheral foci of ground-glass density. No pleural effusion or pneumothorax. Upper Abdomen: No acute abnormality. Musculoskeletal: Degenerative changes. No acute or suspicious osseous abnormality Review of the MIP images confirms the above findings. IMPRESSION: 1. Negative for acute pulmonary embolus. 2. Multifocal bilateral mostly peripheral ground-glass densities, consistent with bilateral pneumonia and history of COVID positivity. 3. Mild cardiomegaly. Reflux of contrast into the hepatic veins consistent with elevated right heart pressures. Aortic Atherosclerosis (ICD10-I70.0). Electronically Signed   By: Donavan Foil M.D.   On: 08/18/2019 23:41        Scheduled Meds: . vitamin C  500 mg Oral Daily  . carvedilol  3.125 mg Oral BID WC  . dexamethasone (DECADRON) injection  6 mg Intravenous Q24H  . feeding supplement (ENSURE ENLIVE)  237 mL Oral TID BM  . folic acid  1 mg Oral Daily  . heparin injection (subcutaneous)  5,000 Units Subcutaneous Q8H  . levothyroxine  50 mcg Oral Q0600  . multivitamin with minerals  1 tablet Oral Daily  . thiamine  100 mg Oral Daily   Or  . thiamine  100 mg Intravenous  Daily  . zinc sulfate  220 mg Oral Daily   Continuous Infusions: . remdesivir 100 mg in NS 100 mL 100 mg (08/20/19 1021)     LOS: 2 days        Hosie Poisson, MD [facility]   To contact the attending provider between 7A-7P or the covering provider during after hours 7P-7A, please log into the web site [email or web address] and access using universal [place] password for that web site. If you do not have the password, please call the hospital operator.  08/20/2019, 2:32 PM

## 2019-08-20 NOTE — Plan of Care (Signed)
  Problem: Education: Goal: Knowledge of risk factors and measures for prevention of condition will improve Outcome: Progressing   Problem: Coping: Goal: Psychosocial and spiritual needs will be supported Outcome: Progressing   Problem: Respiratory: Goal: Will maintain a patent airway Outcome: Progressing Goal: Complications related to the disease process, condition or treatment will be avoided or minimized Outcome: Progressing   

## 2019-08-20 NOTE — Evaluation (Signed)
Physical Therapy Evaluation Patient Details Name: Lucas Fuller MRN: [id number] DOB: 1941/01/27 Today's Date: 08/20/2019   History of Present Illness  Pt adm with PNA due to covid 19. PMH - aflutter s/p ablation, bladder CA, nonischemic cardiomyopathy, systolic heart failure, HTN, etoh disorder.  Clinical Impression  Pt admitted with above diagnosis and presents to PT with functional limitations due to deficits listed below (See PT problem list). Pt needs skilled PT to maximize independence and safety to allow discharge to home alone with HHPT. Expect pt will make steady progress toward return home.      Follow Up Recommendations Home health PT;Supervision - Intermittent    Equipment Recommendations  None recommended by PT    Recommendations for Other Services       Precautions / Restrictions Precautions Precautions: None Restrictions Weight Bearing Restrictions: No      Mobility  Bed Mobility Overal bed mobility: Needs Assistance Bed Mobility: Sit to Supine       Sit to supine: Min assist   General bed mobility comments: Assist to bring feet up in to bed  Transfers Overall transfer level: Needs assistance Equipment used: Rolling walker (2 wheeled) Transfers: Sit to/from Stand Sit to Stand: Min assist         General transfer comment: assist to bring hips up  Ambulation/Gait Ambulation/Gait assistance: Supervision Gait Distance (Feet): 75 Feet Assistive device: Rolling walker (2 wheeled) Gait Pattern/deviations: Step-through pattern;Decreased step length - right;Decreased step length - left Gait velocity: decr Gait velocity interpretation: <1.31 ft/sec, indicative of household ambulator General Gait Details: Assist for safety.   Stairs            Wheelchair Mobility    Modified Rankin (Stroke Patients Only)       Balance Overall balance assessment: Needs assistance Sitting-balance support: No upper extremity supported;Feet supported Sitting  balance-Leahy Scale: Good     Standing balance support: No upper extremity supported Standing balance-Leahy Scale: Fair                               Pertinent Vitals/Pain Pain Assessment: No/denies pain    Home Living Family/patient expects to be discharged to:: Private residence Living Arrangements: Alone Available Help at Discharge: Family;Available PRN/intermittently(niece) Type of Home: House Home Access: Level entry     Home Layout: One level Home Equipment: Walker - 2 wheels      Prior Function Level of Independence: Independent         Comments: Pt drives, does his shopping and works as a Training and development officer at [employer]        Extremity/Trunk Assessment   Upper Extremity Assessment Upper Extremity Assessment: Defer to OT evaluation    Lower Extremity Assessment Lower Extremity Assessment: Generalized weakness       Communication   Communication: No difficulties  Cognition Arousal/Alertness: Awake/alert Behavior During Therapy: WFL for tasks assessed/performed Overall Cognitive Status: No family/caregiver present to determine baseline cognitive functioning                                 General Comments: A little slow with processing      General Comments General comments (skin integrity, edema, etc.): Pt on RA with SpO2 96%    Exercises     Assessment/Plan    PT Assessment Patient needs continued PT services  PT Problem  List Decreased strength;Decreased balance;Decreased mobility;Decreased activity tolerance       PT Treatment Interventions DME instruction;Gait training;Therapeutic activities;Functional mobility training;Therapeutic exercise;Balance training;Patient/family education    PT Goals (Current goals can be found in the Care Plan section)  Acute Rehab PT Goals Patient Stated Goal: return home PT Goal Formulation: With patient Time For Goal Achievement: 08/27/19 Potential to Achieve  Goals: Good    Frequency Min 3X/week   Barriers to discharge Decreased caregiver support lives alone    Co-evaluation               AM-PAC PT "6 Clicks" Mobility  Outcome Measure Help needed turning from your back to your side while in a flat bed without using bedrails?: None Help needed moving from lying on your back to sitting on the side of a flat bed without using bedrails?: A Little Help needed moving to and from a bed to a chair (including a wheelchair)?: A Little Help needed standing up from a chair using your arms (e.g., wheelchair or bedside chair)?: A Little Help needed to walk in hospital room?: A Little Help needed climbing 3-5 steps with a railing? : A Little 6 Click Score: 19    End of Session   Activity Tolerance: Patient tolerated treatment well Patient left: in bed;with call bell/phone within reach;with nursing/sitter in room Nurse Communication: Mobility status PT Visit Diagnosis: Other abnormalities of gait and mobility (R26.89);Muscle weakness (generalized) (M62.81)    Time: [id number] PT Time Calculation (min) (ACUTE ONLY): 21 min   Charges:   PT Evaluation $PT Eval Moderate Complexity: [place] Pager [phone] Office [place] 08/20/2019, 5:16 PM

## 2019-08-21 LAB — LACTATE DEHYDROGENASE: LDH: 298 U/L — ABNORMAL HIGH (ref 98–192)

## 2019-08-21 LAB — CBC WITH DIFFERENTIAL/PLATELET
Abs Immature Granulocytes: 0.05 10*3/uL (ref 0.00–0.07)
Basophils Absolute: 0 10*3/uL (ref 0.0–0.1)
Basophils Relative: 0 %
Eosinophils Absolute: 0 10*3/uL (ref 0.0–0.5)
Eosinophils Relative: 0 %
HCT: 44.2 % (ref 39.0–52.0)
Hemoglobin: 15.1 g/dL (ref 13.0–17.0)
Immature Granulocytes: 0 %
Lymphocytes Relative: 6 %
Lymphs Abs: 0.7 10*3/uL (ref 0.7–4.0)
MCH: 32.5 pg (ref 26.0–34.0)
MCHC: 34.2 g/dL (ref 30.0–36.0)
MCV: 95.3 fL (ref 80.0–100.0)
Monocytes Absolute: 0.7 10*3/uL (ref 0.1–1.0)
Monocytes Relative: 6 %
Neutro Abs: 10.6 10*3/uL — ABNORMAL HIGH (ref 1.7–7.7)
Neutrophils Relative %: 88 %
Platelets: 175 10*3/uL (ref 150–400)
RBC: 4.64 MIL/uL (ref 4.22–5.81)
RDW: 13.2 % (ref 11.5–15.5)
WBC: 12.1 10*3/uL — ABNORMAL HIGH (ref 4.0–10.5)
nRBC: 0 % (ref 0.0–0.2)

## 2019-08-21 LAB — D-DIMER, QUANTITATIVE: D-Dimer, Quant: 0.69 ug/mL-FEU — ABNORMAL HIGH (ref 0.00–0.50)

## 2019-08-21 LAB — COMPREHENSIVE METABOLIC PANEL
ALT: 30 U/L (ref 0–44)
AST: 36 U/L (ref 15–41)
Albumin: 2.4 g/dL — ABNORMAL LOW (ref 3.5–5.0)
Alkaline Phosphatase: 41 U/L (ref 38–126)
Anion gap: 11 (ref 5–15)
BUN: 31 mg/dL — ABNORMAL HIGH (ref 8–23)
CO2: 26 mmol/L (ref 22–32)
Calcium: 8.6 mg/dL — ABNORMAL LOW (ref 8.9–10.3)
Chloride: 102 mmol/L (ref 98–111)
Creatinine, Ser: 0.81 mg/dL (ref 0.61–1.24)
GFR calc Af Amer: >60 mL/min (ref >60)
GFR calc non Af Amer: >60 mL/min (ref >60)
Glucose, Bld: 125 mg/dL — ABNORMAL HIGH (ref 70–99)
Potassium: 4.2 mmol/L (ref 3.5–5.1)
Sodium: 139 mmol/L (ref 135–145)
Total Bilirubin: 0.4 mg/dL (ref 0.3–1.2)
Total Protein: 6.8 g/dL (ref 6.5–8.1)

## 2019-08-21 LAB — C-REACTIVE PROTEIN: CRP: 6.6 mg/dL — ABNORMAL HIGH (ref <1.0)

## 2019-08-21 NOTE — Evaluation (Signed)
Occupational Therapy Evaluation Patient Details Name: Lucas Fuller MRN: [id number] DOB: September 20, 1940 Today's Date: 08/21/2019    History of Present Illness Pt adm with PNA due to covid 19. PMH - aflutter s/p ablation, bladder CA, nonischemic cardiomyopathy, systolic heart failure, HTN, etoh disorder.   Clinical Impression   This 79 yo male admitted with above presents to acute OT with mild decreased balance when up on his feet without AD (normally he reports he does not use AD). Pt slow to respond at times but feel this may be his baseline. Pt reports he feels to about 75% back to baseline. He will benefit from acute OT with follow up [place].    Follow Up Recommendations  Home health OT;Supervision - Intermittent    Equipment Recommendations  None recommended by OT       Precautions / Restrictions Precautions Precautions: None Restrictions Weight Bearing Restrictions: No      Mobility Bed Mobility Overal bed mobility: Modified Independent                Transfers Overall transfer level: Needs assistance Equipment used: None Transfers: Sit to/from Stand Sit to Stand: Min guard              Balance Overall balance assessment: Needs assistance Sitting-balance support: No upper extremity supported;Feet supported Sitting balance-Leahy Scale: Good     Standing balance support: No upper extremity supported Standing balance-Leahy Scale: Fair Standing balance comment: standing at sink to wash hands                           ADL either performed or assessed with clinical judgement   ADL Overall ADL's : Needs assistance/impaired Eating/Feeding: Independent;Sitting   Grooming: Min guard;Standing   Upper Body Bathing: Supervision/ safety;Set up;Sitting   Lower Body Bathing: Min guard;Sit to/from stand   Upper Body Dressing : Supervision/safety;Set up;Sitting   Lower Body Dressing: Min guard;Sit to/from stand   Toilet Transfer: Min  guard;Ambulation;Comfort height toilet;Grab bars   Toileting- Clothing Manipulation and Hygiene: Min guard;Sit to/from stand               Vision Baseline Vision/History: Wears glasses Wears Glasses: At all times Patient Visual Report: No change from baseline              Pertinent Vitals/Pain Pain Assessment: No/denies pain     Hand Dominance Right   Extremity/Trunk Assessment Upper Extremity Assessment Upper Extremity Assessment: Overall WFL for tasks assessed           Communication Communication Communication: No difficulties   Cognition Arousal/Alertness: Awake/alert Behavior During Therapy: WFL for tasks assessed/performed Overall Cognitive Status: No family/caregiver present to determine baseline cognitive functioning                                 General Comments: A little slow with processing              Home Living Family/patient expects to be discharged to:: Private residence   Available Help at Discharge: Family;Available PRN/intermittently(niece) Type of Home: House Home Access: Level entry     Home Layout: One level     Bathroom Shower/Tub: Tub/shower unit;Curtain   Bathroom Toilet: Handicapped height     Home Equipment: Environmental consultant - 2 wheels;Shower seat;Grab bars - toilet;Grab bars - tub/shower          Prior Functioning/Environment Level of Independence: Independent  Comments: Pt drives, does his shopping and works as a Training and development officer at [employer] List: Impaired balance (sitting and/or standing)      OT Treatment/Interventions: Self-care/ADL training;DME and/or AE instruction;Patient/family education;Balance training    OT Goals(Current goals can be found in the care plan section) Acute Rehab OT Goals Patient Stated Goal: to go home OT Goal Formulation: With patient Time For Goal Achievement: 09/04/19 Potential to Achieve Goals: Good  OT Frequency: Min 2X/week               AM-PAC OT "6 Clicks" Daily Activity     Outcome Measure Help from another person eating meals?: None Help from another person taking care of personal grooming?: A Little Help from another person toileting, which includes using toliet, bedpan, or urinal?: A Little Help from another person bathing (including washing, rinsing, drying)?: A Little Help from another person to put on and taking off regular upper body clothing?: A Little Help from another person to put on and taking off regular lower body clothing?: A Little 6 Click Score: 19   End of Session Equipment Utilized During Treatment: Gait belt  Activity Tolerance: Patient tolerated treatment well Patient left: in bed;with call bell/phone within reach;with bed alarm set  OT Visit Diagnosis: Unsteadiness on feet (R26.81);Other abnormalities of gait and mobility (R26.89)                Time: 0218-0233 OT Time Calculation (min): 15 min Charges:  OT General Charges $OT Visit: 1 Visit OT Treatments $Self Care/Home Management : 8-22 mins  Tye [place], OTR/L [place] Pager [phone] Office [phone]     08/21/2019, 3:55 PM

## 2019-08-21 NOTE — TOC Initial Note (Signed)
Transition of Care [facility]) - Initial/Assessment Note    Patient Details  Name: Lucas Fuller MRN: [id number] Date of Birth: Jun 18, 1941  [facility] CM/SW Contact:    Maryclare Labrador, RN Phone Number: 08/21/2019, 4:02 PM  Clinical Narrative:  PTA from home alone, pt informed CM that his niece will provide any support needed at discharge.  Niece will also transport pt home at discharge.  Pt confirms he has a PCP and denied barriers with paying for discharge medications.  Pt in agreement with HH as ordered - CM offered medicare. gov  HH list - pt chose [facility] - agency accepts referral and will begin services on Tuesday.  Both pt and attending in agreement with [facility] on Tuesday.                   Expected Discharge Plan: [place] Barriers to Discharge: Continued Medical Work up   Patient Goals and CMS Choice Patient states their goals for this hospitalization and ongoing recovery are:: Pt did not specifiy a goal for this hosptialization nor ongoing recovery care CMS Medicare.gov Compare Post Acute Care list provided to:: Patient Choice offered to / list presented to : Patient  Expected Discharge Plan and Services Expected Discharge Plan: [place]       Living arrangements for the past 2 months: Single Family Home                           HH Arranged: RN, PT, OT [facility] Agency: [place] Date [place]: 08/21/19 Time [place]: 1602 Representative spoke with at [place]: [place] Arrangements/Services Living arrangements for the past 2 months: [place] with:: Self Patient language and need for interpreter reviewed:: Yes Do you feel safe going back to the place where you live?: Yes      Need for Family Participation in Patient Care: No (Comment) Care giver support system in place?: Yes (comment)   Criminal Activity/Legal Involvement Pertinent to Current  Situation/Hospitalization: No - Comment as needed  Activities of Daily Living Home Assistive Devices/Equipment: Cane (specify quad or straight) ADL Screening (condition at time of admission) Patient's cognitive ability adequate to safely complete daily activities?: Yes Is the patient deaf or have difficulty hearing?: No Does the patient have difficulty seeing, even when wearing glasses/contacts?: No Does the patient have difficulty concentrating, remembering, or making decisions?: No Patient able to express need for assistance with ADLs?: Yes Does the patient have difficulty dressing or bathing?: No Independently performs ADLs?: Yes (appropriate for developmental age) Does the patient have difficulty walking or climbing stairs?: No Weakness of Legs: Both Weakness of Arms/Hands: Both  Permission Sought/Granted   Permission granted to share information with : Yes, Verbal Permission Granted              Emotional Assessment   Attitude/Demeanor/Rapport: Self-Confident, Engaged Affect (typically observed): Accepting Orientation: : Oriented to Self, Oriented to Place, Oriented to  Time, Oriented to Situation   Psych Involvement: No (comment)  Admission diagnosis:  COVID-19 virus infection [U07.1] Pneumonia due to COVID-19 virus [U07.1, J12.82] Patient Active Problem List   Diagnosis Date Noted  . Pneumonia due to COVID-19 virus 08/18/2019  . Essential hypertension 06/24/2018  . Malnutrition of moderate degree 12/07/2015  . Acute systolic CHF (congestive heart failure) ([place])   . Cardiomyopathy ([place])   . SOB (shortness  of breath) 12/05/2015  . Atrial flutter ([place]) 12/05/2015  . Edema 12/05/2015  . Hypothyroidism 12/05/2015  . ETOH abuse 12/05/2015  . Thrombocytopenia ([place]) 12/05/2015  . Weight loss 12/05/2015  . Lumbar disc disease 12/05/2015  . Protein calorie malnutrition ([place]) 12/05/2015  . CHF (congestive heart failure) ([place]) 12/05/2015  . Elevated brain natriuretic  peptide (BNP) level   . Paroxysmal atrial fibrillation (HCC)   . Acute diastolic congestive heart failure ([place])    PCP:  Merrilee Seashore, MD Pharmacy:   [id number] - [place], [address]. AT [address] [place] [place]. [place] [address] Phone: [phone] Fax: [phone]     Social Determinants of Health (SDOH) Interventions    Readmission Risk Interventions No flowsheet data found.

## 2019-08-21 NOTE — Progress Notes (Signed)
Physical Therapy Treatment Patient Details Name: Lucas Fuller MRN: [id number] DOB: 1940-09-09 Today's Date: 08/21/2019    History of Present Illness Pt adm with PNA due to covid 19. PMH - aflutter s/p ablation, bladder CA, nonischemic cardiomyopathy, systolic heart failure, HTN, etoh disorder.    PT Comments    Pt making steady progress. Feel he can return home with HHPT but it likely will be a while before he can return to work from a mobility and activity tolerance standpoint.    Follow Up Recommendations  Home health PT;Supervision - Intermittent     Equipment Recommendations  None recommended by PT    Recommendations for Other Services       Precautions / Restrictions Precautions Precautions: None Restrictions Weight Bearing Restrictions: No    Mobility  Bed Mobility Overal bed mobility: Modified Independent Bed Mobility: Supine to Sit     Supine to sit: Modified independent (Device/Increase time);HOB elevated     General bed mobility comments: Incr time  Transfers Overall transfer level: Needs assistance Equipment used: Rolling walker (2 wheeled) Transfers: Sit to/from Stand Sit to Stand: Supervision         General transfer comment: Verbal cues for hand placement. Incr time to rise and posterior legs braced on bed  Ambulation/Gait Ambulation/Gait assistance: Supervision Gait Distance (Feet): 150 Feet Assistive device: Rolling walker (2 wheeled) Gait Pattern/deviations: Step-through pattern;Decreased step length - right;Decreased step length - left Gait velocity: decr Gait velocity interpretation: <1.31 ft/sec, indicative of household ambulator General Gait Details: Assist for safety. Verbal cues to stay closer to walker on turns   Stairs             Wheelchair Mobility    Modified Rankin (Stroke Patients Only)       Balance Overall balance assessment: Needs assistance Sitting-balance support: No upper extremity supported;Feet  supported Sitting balance-Leahy Scale: Good     Standing balance support: No upper extremity supported Standing balance-Leahy Scale: Fair                              Cognition Arousal/Alertness: Awake/alert Behavior During Therapy: WFL for tasks assessed/performed Overall Cognitive Status: No family/caregiver present to determine baseline cognitive functioning                                 General Comments: A little slow with processing      Exercises      General Comments        Pertinent Vitals/Pain Pain Assessment: No/denies pain    Home Living                      Prior Function            PT Goals (current goals can now be found in the care plan section) Acute Rehab PT Goals Patient Stated Goal: return home Progress towards PT goals: Progressing toward goals    Frequency    Min 3X/week      PT Plan Current plan remains appropriate    Co-evaluation              AM-PAC PT "6 Clicks" Mobility   Outcome Measure  Help needed turning from your back to your side while in a flat bed without using bedrails?: None Help needed moving from lying on your back to sitting on the side of a  flat bed without using bedrails?: A Little Help needed moving to and from a bed to a chair (including a wheelchair)?: A Little Help needed standing up from a chair using your arms (e.g., wheelchair or bedside chair)?: A Little Help needed to walk in hospital room?: A Little Help needed climbing 3-5 steps with a railing? : A Little 6 Click Score: 19    End of Session   Activity Tolerance: Patient tolerated treatment well Patient left: with call bell/phone within reach;with nursing/sitter in room;in chair(chair at sink with nurse tech) Nurse Communication: Mobility status PT Visit Diagnosis: Other abnormalities of gait and mobility (R26.89);Muscle weakness (generalized) (M62.81)     Time: [id number] PT Time Calculation (min) (ACUTE  ONLY): 14 min  Charges:  $Gait Training: 8-22 mins                     [place] Pager [phone] Office [place] 08/21/2019, 11:25 AM

## 2019-08-21 NOTE — Progress Notes (Signed)
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  On arrival to ED his oxygen sats low 90s on room air, tachypneic and his SARS Covid PCR test is positive.  He was admitted for evaluation and management of COVID-19 pneumonia. Pt seen and examined at bedside, as per the niece pt does not have the means for transport from and to the infusion center for remdesivir and PT/OT evaluation.   Assessment & Plan:   Principal Problem:   Pneumonia due to COVID-19 virus Active Problems:   Atrial flutter (HCC)   ETOH abuse   CHF (congestive heart failure) (HCC)   Essential hypertension   Multifocal pneumonia secondary to COVID-19 viral infection. Slowly improving, cough is better tachypnea is better patient remains on room air with oxygen sats in low 90s. LDH is 325, CRP has improved to 11.7, D-dimer is 1. Another dose of remdesivir in the morning and complete the course of Decadron on discharge. CT angiogram of the chest is negative for PE. IV heparin discontinued.  Patient denies any chest pain or shortness of breath at this time. Nasal cannula oxygen to keep sats greater than 90%. Recommend PT/OT evaluation for disposition.  PT recommended home health PT on discharge.   Atrial flutter s/p ablation in 2017. Eliquis was stopped in 2018 and aspirin was started.  Discussed with the niece and it seems that anticoagulation was stopped as patient was having frequent falls at home. Rate controlled.  family did not want to be started on anticoagulation at this time.  Chronic systolic heart failure He appears euvolemic Echocardiogram  shows There was moderate concentric hypertrophy. Systolic function was mildly to moderately reduced. The estimated   ejection fraction was in the range of 40% to 45%. Diffuse   hypokinesis. Doppler parameters are consistent with abnormal left ventricular relaxation (grade 1   diastolic dysfunction). Continue with Coreg   EtOH abuse Continue with CIWA protocol. No signs of withdrawal       Essential hypertension Blood pressure parameters well controlled  Hypothyroidism Continue with Synthroid 50 MCG daily.   DVT prophylaxis:  Code Status: Full code Family Communication: None at bedside Disposition Plan:  . Patient came from: Home           . Anticipated d/c place: Home health PT in the morning Barriers to d/c OR conditions which need to be met to effect a safe d/c: Home PT to start on Tuesday plan for discharge in the morning after remdesivir dose Consultants:   None  Procedures: None Antimicrobials: None  Subjective: Patient denies any chest pain or shortness of breath, no more nausea vomiting or abdominal pain at this time.  Objective: Vitals:   08/20/19 1300 08/20/19 2125 08/21/19 0545 08/21/19 0754  BP: 1'11/88 93/65 92/77 '$ 106/76  Pulse: 82 61 63 73  Resp: '13 20 16   '$ Temp: 97.6 F (36.4 C) 97.6 F (36.4 C) 97.8 F (36.6 C)   TempSrc: Oral Oral Axillary   SpO2: 95% 94% 94%   Weight:      Height:        Intake/Output Summary (Last 24 hours) at 08/21/2019 0902 Last data filed at 08/21/2019 0529 Gross per 24  hour  Intake 660 ml  Output 800 ml  Net -140 ml   Filed Weights   08/19/19 0000  Weight: 68.7 kg    Examination:  General exam: Alert and comfortable, no distress noted currently on room air Respiratory system: Air entry fair, no wheezing or rhonchi Cardiovascular system: S1-S2 heard, irregularly irregular, no JVD Gastrointestinal system: Abdomen is soft, nontender, nondistended, bowel sounds normal Central nervous system: Patient is alert and  oriented and grossly nonfocal Extremities: No pedal edema Skin: No rashes Psychiatry: .  Mood is appropriate Data Reviewed: I have personally reviewed following labs and imaging studies  CBC: Recent Labs  Lab 08/18/19 1640 08/19/19 0157 08/20/19 0326 08/21/19 0750  WBC 6.8 7.6 9.7 12.1*  NEUTROABS  --  5.8 8.2* 10.6*  HGB 16.1 16.3 15.4 15.1  HCT 47.6 47.5 44.2 44.2  MCV 96.9 95.8 94.4 95.3  PLT 163 166 194 280   Basic Metabolic Panel: Recent Labs  Lab 08/18/19 1640 08/19/19 0157 08/20/19 0326  NA 138 136 139  K 3.7 3.9 3.7  CL 101 100 103  CO2 '22 22 24  '$ GLUCOSE 103* 118* 147*  BUN 12 19 37*  CREATININE 1.06 1.10 0.96  CALCIUM 8.7* 8.5* 8.8*  MG  --  2.3  --   PHOS  --  4.1  --    GFR: Estimated Creatinine Clearance: 59.4 mL/min (by C-G formula based on SCr of 0.96 mg/dL). Liver Function Tests: Recent Labs  Lab 08/18/19 1649 08/19/19 0157 08/20/19 0326  AST 64* 52* 50*  ALT 42 35 36  ALKPHOS 42 40 40  BILITOT 0.9 0.8 0.2*  PROT 8.4* 7.7 7.3  ALBUMIN 3.0* 2.9* 2.5*   No results for input(s): LIPASE, AMYLASE in the last 168 hours. No results for input(s): AMMONIA in the last 168 hours. Coagulation Profile: No results for input(s): INR, PROTIME in the last 168 hours. Cardiac Enzymes: No results for input(s): CKTOTAL, CKMB, CKMBINDEX, TROPONINI in the last 168 hours. BNP (last 3 results) No results for input(s): PROBNP in the last 8760 hours. HbA1C: No results for input(s): HGBA1C in the last 72 hours. CBG: No results for input(s): GLUCAP in the last 168 hours. Lipid Profile: Recent Labs    08/18/19 2135  TRIG 103   Thyroid Function Tests: Recent Labs    08/18/19 1856  TSH 3.330   Anemia Panel: Recent Labs    08/18/19 2135  FERRITIN 1,518*   Sepsis Labs: Recent Labs  Lab 08/18/19 2135  PROCALCITON <0.10    Recent Results (from the past 240 hour(s))  Respiratory Panel by RT PCR (Flu A&B, Covid) - Nasopharyngeal Swab     Status:  Abnormal   Collection Time: 08/18/19  6:11 PM   Specimen: Nasopharyngeal Swab  Result Value Ref Range Status   SARS Coronavirus 2 by RT PCR POSITIVE (A) NEGATIVE Final    Comment: RESULT CALLED TO, READ BACK BY AND VERIFIED WITH: A CAIN RN 1915 08/18/19 A BROWNING (NOTE) SARS-CoV-2 target nucleic acids are DETECTED. SARS-CoV-2 RNA is generally detectable in upper respiratory specimens  during the acute phase of infection. Positive results are indicative of the presence of the identified virus, but do not rule out bacterial infection or co-infection with other pathogens not detected by the test. Clinical correlation with patient history and other diagnostic information is necessary to determine patient infection status. The expected result is Negative. Fact Sheet for Patients:  PinkCheek.be Fact Sheet for Healthcare Providers: GravelBags.it This test is not yet  approved or cleared by the [place] and  has been authorized for detection and/or diagnosis of SARS-CoV-2 by FDA under an Emergency Use Authorization (EUA).  This EUA will remain in effect (meaning this test can be used) for  the duration of  the COVID-19 declaration under Section 564(b)(1) of the Act, 21 U.S.C. section 360bbb-3(b)(1), unless the authorization is terminated or revoked sooner.    Influenza A by PCR NEGATIVE NEGATIVE Final   Influenza B by PCR NEGATIVE NEGATIVE Final    Comment: (NOTE) The Xpert Xpress SARS-CoV-2/FLU/RSV assay is intended as an aid in  the diagnosis of influenza from Nasopharyngeal swab specimens and  should not be used as a sole basis for treatment. Nasal washings and  aspirates are unacceptable for Xpert Xpress SARS-CoV-2/FLU/RSV  testing. Fact Sheet for Patients: PinkCheek.be Fact Sheet for Healthcare Providers: GravelBags.it This test is not yet approved or cleared  by the [place] FDA and  has been authorized for detection and/or diagnosis of SARS-CoV-2 by  FDA under an Emergency Use Authorization (EUA). This EUA will remain  in effect (meaning this test can be used) for the duration of the  Covid-19 declaration under Section 564(b)(1) of the Act, 21  U.S.C. section 360bbb-3(b)(1), unless the authorization is  terminated or revoked. Performed at [facility], [place] [address], [place], [place] [address]   Culture, blood (Routine X 2) w Reflex to ID Panel     Status: None (Preliminary result)   Collection Time: 08/19/19  1:57 AM   Specimen: BLOOD  Result Value Ref Range Status   Specimen Description BLOOD RIGHT ANTECUBITAL  Final   Special Requests   Final    BOTTLES DRAWN AEROBIC AND ANAEROBIC Blood Culture adequate volume   Culture   Final    NO GROWTH 2 DAYS Performed at [facility], [place] [address] [place], [place] [address]    Report Status PENDING  Incomplete  Culture, blood (Routine X 2) w Reflex to ID Panel     Status: None (Preliminary result)   Collection Time: 08/19/19  2:14 AM   Specimen: BLOOD  Result Value Ref Range Status   Specimen Description BLOOD RIGHT ANTECUBITAL  Final   Special Requests   Final    BOTTLES DRAWN AEROBIC AND ANAEROBIC Blood Culture adequate volume   Culture   Final    NO GROWTH 2 DAYS Performed at [facility], [place] [address], [place], [place] [address]    Report Status PENDING  Incomplete         Radiology Studies: No results found.      Scheduled Meds: . vitamin C  500 mg Oral Daily  . carvedilol  3.125 mg Oral BID WC  . dexamethasone (DECADRON) injection  6 mg Intravenous Q24H  . feeding supplement (ENSURE ENLIVE)  237 mL Oral TID BM  . folic acid  1 mg Oral Daily  . heparin injection (subcutaneous)  5,000 Units Subcutaneous Q8H  . levothyroxine  50 mcg Oral Q0600  . multivitamin with minerals  1 tablet Oral Daily  . thiamine  100 mg Oral Daily  . zinc sulfate   220 mg Oral Daily   Continuous Infusions: . remdesivir 100 mg in NS 100 mL 100 mg (08/20/19 1021)     LOS: 3 days        Hosie Poisson, MD [facility]   To contact the attending provider between 7A-7P or the covering provider during after hours 7P-7A, please  log into the web site [email or web address] and access using universal  password for that web site. If you do not have the password, please call the hospital operator.  08/21/2019, 9:02 AM

## 2019-08-22 LAB — COMPREHENSIVE METABOLIC PANEL
ALT: 29 U/L (ref 0–44)
AST: 34 U/L (ref 15–41)
Albumin: 2.3 g/dL — ABNORMAL LOW (ref 3.5–5.0)
Alkaline Phosphatase: 40 U/L (ref 38–126)
Anion gap: 9 (ref 5–15)
BUN: 25 mg/dL — ABNORMAL HIGH (ref 8–23)
CO2: 26 mmol/L (ref 22–32)
Calcium: 8.3 mg/dL — ABNORMAL LOW (ref 8.9–10.3)
Chloride: 101 mmol/L (ref 98–111)
Creatinine, Ser: 0.76 mg/dL (ref 0.61–1.24)
GFR calc Af Amer: >60 mL/min (ref >60)
GFR calc non Af Amer: >60 mL/min (ref >60)
Glucose, Bld: 124 mg/dL — ABNORMAL HIGH (ref 70–99)
Potassium: 4.4 mmol/L (ref 3.5–5.1)
Sodium: 136 mmol/L (ref 135–145)
Total Bilirubin: 0.3 mg/dL (ref 0.3–1.2)
Total Protein: 6.8 g/dL (ref 6.5–8.1)

## 2019-08-22 LAB — CBC WITH DIFFERENTIAL/PLATELET
Abs Immature Granulocytes: 0.09 10*3/uL — ABNORMAL HIGH (ref 0.00–0.07)
Basophils Absolute: 0 10*3/uL (ref 0.0–0.1)
Basophils Relative: 0 %
Eosinophils Absolute: 0 10*3/uL (ref 0.0–0.5)
Eosinophils Relative: 0 %
HCT: 42.2 % (ref 39.0–52.0)
Hemoglobin: 14.6 g/dL (ref 13.0–17.0)
Immature Granulocytes: 1 %
Lymphocytes Relative: 6 %
Lymphs Abs: 0.7 10*3/uL (ref 0.7–4.0)
MCH: 32.5 pg (ref 26.0–34.0)
MCHC: 34.6 g/dL (ref 30.0–36.0)
MCV: 94 fL (ref 80.0–100.0)
Monocytes Absolute: 0.9 10*3/uL (ref 0.1–1.0)
Monocytes Relative: 7 %
Neutro Abs: 10.1 10*3/uL — ABNORMAL HIGH (ref 1.7–7.7)
Neutrophils Relative %: 86 %
Platelets: 183 10*3/uL (ref 150–400)
RBC: 4.49 MIL/uL (ref 4.22–5.81)
RDW: 13.1 % (ref 11.5–15.5)
WBC: 11.8 10*3/uL — ABNORMAL HIGH (ref 4.0–10.5)
nRBC: 0 % (ref 0.0–0.2)

## 2019-08-22 LAB — LACTATE DEHYDROGENASE: LDH: 285 U/L — ABNORMAL HIGH (ref 98–192)

## 2019-08-22 LAB — D-DIMER, QUANTITATIVE: D-Dimer, Quant: 0.61 ug/mL-FEU — ABNORMAL HIGH (ref 0.00–0.50)

## 2019-08-22 LAB — C-REACTIVE PROTEIN: CRP: 4.7 mg/dL — ABNORMAL HIGH (ref <1.0)

## 2019-08-22 MED ORDER — CARVEDILOL 3.125 MG PO TABS: 3.1250 mg | ORAL_TABLET | Freq: Two times a day (BID) | ORAL | 1 refills | Status: DC

## 2019-08-22 MED ORDER — ADULT MULTIVITAMIN W/MINERALS CH: 1.0000 | ORAL_TABLET | Freq: Every day | ORAL | Status: DC

## 2019-08-22 MED ORDER — ALBUTEROL SULFATE HFA 108 (90 BASE) MCG/ACT IN AERS: 2.0000 | INHALATION_SPRAY | RESPIRATORY_TRACT | 0 refills | Status: DC | PRN

## 2019-08-22 MED ORDER — THIAMINE HCL 100 MG PO TABS: 100.0000 mg | ORAL_TABLET | Freq: Every day | ORAL | Status: DC

## 2019-08-22 MED ORDER — FOLIC ACID 1 MG PO TABS: 1.0000 mg | ORAL_TABLET | Freq: Every day | ORAL | 11 refills | Status: DC

## 2019-08-22 MED ORDER — ZINC SULFATE 220 (50 ZN) MG PO CAPS: 220.0000 mg | ORAL_CAPSULE | Freq: Every day | ORAL | Status: DC

## 2019-08-22 MED ORDER — ASCORBIC ACID 500 MG PO TABS: 500.0000 mg | ORAL_TABLET | Freq: Every day | ORAL | Status: DC

## 2019-08-22 MED ORDER — GUAIFENESIN-DM 100-10 MG/5ML PO SYRP: 10.0000 mL | ORAL_SOLUTION | ORAL | 0 refills | Status: DC | PRN

## 2019-08-22 MED ORDER — ENSURE ENLIVE PO LIQD: 237.0000 mL | Freq: Three times a day (TID) | ORAL | 0 refills | Status: AC

## 2019-08-22 MED ORDER — DEXAMETHASONE 6 MG PO TABS: 6.0000 mg | ORAL_TABLET | Freq: Every day | ORAL | 0 refills | Status: AC

## 2019-08-22 NOTE — Discharge Summary (Signed)
Physician Discharge Summary  Lucas Fuller [id number] DOB: 10/23/1940 DOA: 08/18/2019  PCP: Merrilee Seashore, MD  Admit date: 08/18/2019 Discharge date: 08/22/2019  Admitted From: HOME Disposition: Home.   Recommendations for Outpatient Follow-up:  1. Follow up with PCP in 1-2 weeks 2. Please obtain BMP/CBC in one week   Home Health:YES  Discharge Condition:stable. CODE STATUS:full code. 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  On arrival to ED his oxygen sats low 90s on room air, tachypneic and his SARS Covid PCR test is positive.  He was admitted for evaluation and management of COVID-19 pneumonia.   Discharge Diagnoses:  Principal Problem:   Pneumonia due to COVID-19 virus Active Problems:   Atrial flutter (HCC)   ETOH abuse   CHF (congestive heart failure) (HCC)   Essential hypertension  Multifocal pneumonia secondary to COVID-19 viral infection. Appears to have resolved.  LDH is 325, CRP has improved to 11.7, D-dimer is 1. Completed remdisivir and discharge on decadron to complete the course.  CT angiogram of the chest is negative for PE. IV heparin discontinued.  Patient denies any chest pain or shortness of breath at this time. Nasal cannula oxygen to keep sats greater than 90%. Recommend PT/OT evaluation for disposition.  PT recommended home health PT on discharge.   Atrial flutter s/p ablation in 2017. Eliquis was stopped in 2018 and aspirin was started.  Discussed with the niece and it seems that anticoagulation was stopped as patient was having frequent falls at home. Rate controlled.  family did not want to be started on  anticoagulation at this time.  Chronic systolic heart failure He appears euvolemic Echocardiogram shows There was moderate concentric hypertrophy. Systolic function was mildly to moderately reduced. The estimated ejection fraction was in the range of 40% to 45%. Diffuse hypokinesis. Doppler parameters are consistent with abnormal left ventricular relaxation (grade 1 diastolic dysfunction). Continue with Coreg   EtOH abuse Continue with CIWA protocol. No signs of withdrawal     Essential hypertension Blood pressure parameters well controlled  Hypothyroidism Continue with Synthroid 50 MCG daily.   Discharge Instructions  Discharge Instructions    Diet - low sodium heart healthy   Complete by: As directed    Discharge instructions   Complete by: As directed    Please follow up with PCP  In 2 to 3 weeks.     Allergies as of 08/22/2019      Reactions   Asa [aspirin] Hives   Pt can tolerate 81 mg with no issues      Medication List    STOP taking these medications   lisinopril 2.5 MG tablet Commonly known as: ZESTRIL     TAKE these medications   albuterol 108 (90 Base) MCG/ACT inhaler Commonly known as: VENTOLIN HFA Inhale 2 puffs into the lungs every 4 (four) hours as needed for wheezing or shortness of breath.   ascorbic acid 500 MG tablet Commonly known as: VITAMIN C Take 1 tablet (500 mg total) by mouth daily. Start taking on: August 23, 2019   aspirin EC 81 MG tablet Take 1 tablet (81 mg total) by mouth daily.   carvedilol 3.125 MG tablet Commonly known as: COREG Take 1 tablet (3.125 mg total) by mouth  2 (two) times daily with a meal. What changed:   medication strength  how much to take  when to take this   dexamethasone 6 MG tablet Commonly known as: DECADRON Take 1 tablet (6 mg total) by mouth daily for 6 days.   feeding supplement (ENSURE ENLIVE) Liqd Take 237 mLs by mouth 3 (three) times daily between meals.   folic acid  1 MG tablet Commonly known as: FOLVITE Take 1 tablet (1 mg total) by mouth daily. Start taking on: August 23, 2019   furosemide 40 MG tablet Commonly known as: LASIX Take 1 tablet (40 mg total) by mouth daily as needed (swelling).   guaiFENesin-dextromethorphan 100-10 MG/5ML syrup Commonly known as: ROBITUSSIN DM Take 10 mLs by mouth every 4 (four) hours as needed for cough.   levothyroxine 50 MCG tablet Commonly known as: SYNTHROID Take 50 mcg by mouth daily before breakfast.   multivitamin with minerals Tabs tablet Take 1 tablet by mouth daily. Start taking on: August 23, 2019   potassium chloride SA 20 MEQ tablet Commonly known as: Klor-Con M20 Take 1 tablet (20 mEq total) by mouth daily as needed (with lasix only).   thiamine 100 MG tablet Take 1 tablet (100 mg total) by mouth daily. Start taking on: August 23, 2019   zinc sulfate 220 (50 Zn) MG capsule Take 1 capsule (220 mg total) by mouth daily. Start taking on: August 23, 2019      Follow-up Information    [facility] Follow up.   Why: Home Health Contact information: [place] [address] [phone]        Merrilee Seashore, MD. Schedule an appointment as soon as possible for a visit in 1 week(s).   Specialty: Internal Medicine Contact information: [address] [place] [address] [phone]        Evans Lance, MD .   Specialty: Cardiology Contact information: [phone] [place] [address] [phone]          Allergies  Allergen Reactions  . Asa [Aspirin] Hives    Pt can tolerate 81 mg with no issues    Consultations:  None.    Procedures/Studies: DG Chest 2 View  Result Date: 08/18/2019 CLINICAL DATA:  Shortness of breath EXAM: CHEST - 2 VIEW COMPARISON:  08/17/2016 FINDINGS: Patchy airspace opacities at the bases. No pleural effusion. Mild cardiomegaly with aortic atherosclerosis. No  pneumothorax. IMPRESSION: Hazy airspace opacities at the bases, either reflecting atelectasis or mild pneumonia. Mild cardiomegaly Electronically Signed   By: Donavan Foil M.D.   On: 08/18/2019 17:18   CT ANGIO CHEST PE W OR WO CONTRAST  Result Date: 08/18/2019 CLINICAL DATA:  Shortness of breath and cough, COVID positive with elevated D-dimer EXAM: CT ANGIOGRAPHY CHEST WITH CONTRAST TECHNIQUE: Multidetector CT imaging of the chest was performed using the standard protocol during bolus administration of intravenous contrast. Multiplanar CT image reconstructions and MIPs were obtained to evaluate the vascular anatomy. CONTRAST:  23mL OMNIPAQUE IOHEXOL 350 MG/ML SOLN COMPARISON:  Chest x-ray 126 2021 FINDINGS: Cardiovascular: Satisfactory opacification of the pulmonary arteries to the segmental level. No evidence of pulmonary embolism. Nonaneurysmal aorta. Moderate aortic atherosclerosis. Mild cardiomegaly. No pericardial effusion. Reflux of contrast into the hepatic veins consistent with elevated right heart pressures. Mediastinum/Nodes: No enlarged mediastinal, hilar, or axillary lymph nodes. Thyroid gland, trachea, and esophagus demonstrate no significant findings. Lungs/Pleura: Multifocal bilateral predominantly subpleural/peripheral foci of ground-glass density. No pleural effusion  or pneumothorax. Upper Abdomen: No acute abnormality. Musculoskeletal: Degenerative changes. No acute or suspicious osseous abnormality Review of the MIP images confirms the above findings. IMPRESSION: 1. Negative for acute pulmonary embolus. 2. Multifocal bilateral mostly peripheral ground-glass densities, consistent with bilateral pneumonia and history of COVID positivity. 3. Mild cardiomegaly. Reflux of contrast into the hepatic veins consistent with elevated right heart pressures. Aortic Atherosclerosis (ICD10-I70.0). Electronically Signed   By: Donavan Foil M.D.   On: 08/18/2019 23:41       Subjective: No new  complaints.   Discharge Exam: Vitals:   08/21/19 2226 08/22/19 0551  BP: 112/78 118/70  Pulse: 68 65  Resp: 19   Temp: 98 F (36.7 C) (!) 97.5 F (36.4 C)  SpO2: 91% 98%   Vitals:   08/21/19 1430 08/21/19 1641 08/21/19 2226 08/22/19 0551  BP:  116/77 112/78 118/70  Pulse: 67 66 68 65  Resp:   19   Temp:   98 F (36.7 C) (!) 97.5 F (36.4 C)  TempSrc:   Oral Oral  SpO2:   91% 98%  Weight:      Height:        General: Pt is alert, awake, not in acute distress Cardiovascular: RRR, S1/S2 +, no rubs, no gallops Respiratory: CTA bilaterally, no wheezing, no rhonchi Abdominal: Soft, NT, ND, bowel sounds + Extremities: no edema, no cyanosis    The results of significant diagnostics from this hospitalization (including imaging, microbiology, ancillary and laboratory) are listed below for reference.     Microbiology: Recent Results (from the past 240 hour(s))  Respiratory Panel by RT PCR (Flu A&B, Covid) - Nasopharyngeal Swab     Status: Abnormal   Collection Time: 08/18/19  6:11 PM   Specimen: Nasopharyngeal Swab  Result Value Ref Range Status   SARS Coronavirus 2 by RT PCR POSITIVE (A) NEGATIVE Final    Comment: RESULT CALLED TO, READ BACK BY AND VERIFIED WITH: A CAIN RN 1915 08/18/19 A BROWNING (NOTE) SARS-CoV-2 target nucleic acids are DETECTED. SARS-CoV-2 RNA is generally detectable in upper respiratory specimens  during the acute phase of infection. Positive results are indicative of the presence of the identified virus, but do not rule out bacterial infection or co-infection with other pathogens not detected by the test. Clinical correlation with patient history and other diagnostic information is necessary to determine patient infection status. The expected result is Negative. Fact Sheet for Patients:  PinkCheek.be Fact Sheet for Healthcare Providers: GravelBags.it This test is not yet approved or  cleared by the [place] FDA and  has been authorized for detection and/or diagnosis of SARS-CoV-2 by FDA under an Emergency Use Authorization (EUA).  This EUA will remain in effect (meaning this test can be used) for  the duration of  the COVID-19 declaration under Section 564(b)(1) of the Act, 21 U.S.C. section 360bbb-3(b)(1), unless the authorization is terminated or revoked sooner.    Influenza A by PCR NEGATIVE NEGATIVE Final   Influenza B by PCR NEGATIVE NEGATIVE Final    Comment: (NOTE) The Xpert Xpress SARS-CoV-2/FLU/RSV assay is intended as an aid in  the diagnosis of influenza from Nasopharyngeal swab specimens and  should not be used as a sole basis for treatment. Nasal washings and  aspirates are unacceptable for Xpert Xpress SARS-CoV-2/FLU/RSV  testing. Fact Sheet for Patients: PinkCheek.be Fact Sheet for Healthcare Providers: GravelBags.it This test is not yet approved or cleared by the [place] FDA and  has been authorized for detection and/or diagnosis of  SARS-CoV-2 by  FDA under an Emergency Use Authorization (EUA). This EUA will remain  in effect (meaning this test can be used) for the duration of the  Covid-19 declaration under Section 564(b)(1) of the Act, 21  U.S.C. section 360bbb-3(b)(1), unless the authorization is  terminated or revoked. Performed at [facility], [place] [address] [place], [place] [address]   Culture, blood (Routine X 2) w Reflex to ID Panel     Status: None (Preliminary result)   Collection Time: 08/19/19  1:57 AM   Specimen: BLOOD  Result Value Ref Range Status   Specimen Description BLOOD RIGHT ANTECUBITAL  Final   Special Requests   Final    BOTTLES DRAWN AEROBIC AND ANAEROBIC Blood Culture adequate volume   Culture   Final    NO GROWTH 2 DAYS Performed at [facility], [place] [address], [place], [place] [address]    Report Status PENDING  Incomplete   Culture, blood (Routine X 2) w Reflex to ID Panel     Status: None (Preliminary result)   Collection Time: 08/19/19  2:14 AM   Specimen: BLOOD  Result Value Ref Range Status   Specimen Description BLOOD RIGHT ANTECUBITAL  Final   Special Requests   Final    BOTTLES DRAWN AEROBIC AND ANAEROBIC Blood Culture adequate volume   Culture   Final    NO GROWTH 2 DAYS Performed at [facility], [place] [address] [place], [place] [address]    Report Status PENDING  Incomplete     Labs: BNP (last 3 results) Recent Labs    08/18/19 1649  BNP [id number]*   Basic Metabolic Panel: Recent Labs  Lab 08/18/19 1640 08/19/19 0157 08/20/19 0326 08/21/19 0750 08/22/19 0314  NA 138 136 139 139 136  K 3.7 3.9 3.7 4.2 4.4  CL 101 100 103 102 101  CO2 22 22 24 26 26   GLUCOSE 103* 118* 147* 125* 124*  BUN 12 19 37* 31* 25*  CREATININE 1.06 1.10 0.96 0.81 0.76  CALCIUM 8.7* 8.5* 8.8* 8.6* 8.3*  MG  --  2.3  --   --   --   PHOS  --  4.1  --   --   --    Liver Function Tests: Recent Labs  Lab 08/18/19 1649 08/19/19 0157 08/20/19 0326 08/21/19 0750 08/22/19 0314  AST 64* 52* 50* 36 34  ALT 42 35 36 30 29  ALKPHOS 42 40 40 41 40  BILITOT 0.9 0.8 0.2* 0.4 0.3  PROT 8.4* 7.7 7.3 6.8 6.8  ALBUMIN 3.0* 2.9* 2.5* 2.4* 2.3*   No results for input(s): LIPASE, AMYLASE in the last 168 hours. No results for input(s): AMMONIA in the last 168 hours. CBC: Recent Labs  Lab 08/18/19 1640 08/19/19 0157 08/20/19 0326 08/21/19 0750 08/22/19 0314  WBC 6.8 7.6 9.7 12.1* 11.8*  NEUTROABS  --  5.8 8.2* 10.6* 10.1*  HGB 16.1 16.3 15.4 15.1 14.6  HCT 47.6 47.5 44.2 44.2 42.2  MCV 96.9 95.8 94.4 95.3 94.0  PLT 163 166 194 175 183   Cardiac Enzymes: No results for input(s): CKTOTAL, CKMB, CKMBINDEX, TROPONINI in the last 168 hours. BNP: Invalid input(s): POCBNP CBG: No results for input(s): GLUCAP in the last 168 hours. D-Dimer Recent Labs    08/21/19 0750 08/22/19 0314  DDIMER 0.69* 0.61*    Hgb A1c No results for input(s): HGBA1C in the last 72 hours. Lipid Profile No results for input(s): CHOL, HDL, LDLCALC,  TRIG, CHOLHDL, LDLDIRECT in the last 72 hours. Thyroid function studies No results for input(s): TSH, T4TOTAL, T3FREE, THYROIDAB in the last 72 hours.  Invalid input(s): FREET3 Anemia work up No results for input(s): VITAMINB12, FOLATE, FERRITIN, TIBC, IRON, RETICCTPCT in the last 72 hours. Urinalysis    Component Value Date/Time   COLORURINE YELLOW 09/21/2015 1211   APPEARANCEUR CLEAR 09/21/2015 1211   LABSPEC 1.025 09/21/2015 1211   PHURINE 5.5 09/21/2015 1211   GLUCOSEU NEGATIVE 09/21/2015 1211   HGBUR NEGATIVE 09/21/2015 1211   BILIRUBINUR NEGATIVE 09/21/2015 1211   KETONESUR NEGATIVE 09/21/2015 1211   PROTEINUR NEGATIVE 09/21/2015 1211   UROBILINOGEN 0.2 06/22/2009 0723   NITRITE NEGATIVE 09/21/2015 1211   LEUKOCYTESUR NEGATIVE 09/21/2015 1211   Sepsis Labs Invalid input(s): PROCALCITONIN,  WBC,  LACTICIDVEN Microbiology Recent Results (from the past 240 hour(s))  Respiratory Panel by RT PCR (Flu A&B, Covid) - Nasopharyngeal Swab     Status: Abnormal   Collection Time: 08/18/19  6:11 PM   Specimen: Nasopharyngeal Swab  Result Value Ref Range Status   SARS Coronavirus 2 by RT PCR POSITIVE (A) NEGATIVE Final    Comment: RESULT CALLED TO, READ BACK BY AND VERIFIED WITH: A CAIN RN 1915 08/18/19 A BROWNING (NOTE) SARS-CoV-2 target nucleic acids are DETECTED. SARS-CoV-2 RNA is generally detectable in upper respiratory specimens  during the acute phase of infection. Positive results are indicative of the presence of the identified virus, but do not rule out bacterial infection or co-infection with other pathogens not detected by the test. Clinical correlation with patient history and other diagnostic information is necessary to determine patient infection status. The expected result is Negative. Fact Sheet for Patients:   PinkCheek.be Fact Sheet for Healthcare Providers: GravelBags.it This test is not yet approved or cleared by the [place] FDA and  has been authorized for detection and/or diagnosis of SARS-CoV-2 by FDA under an Emergency Use Authorization (EUA).  This EUA will remain in effect (meaning this test can be used) for  the duration of  the COVID-19 declaration under Section 564(b)(1) of the Act, 21 U.S.C. section 360bbb-3(b)(1), unless the authorization is terminated or revoked sooner.    Influenza A by PCR NEGATIVE NEGATIVE Final   Influenza B by PCR NEGATIVE NEGATIVE Final    Comment: (NOTE) The Xpert Xpress SARS-CoV-2/FLU/RSV assay is intended as an aid in  the diagnosis of influenza from Nasopharyngeal swab specimens and  should not be used as a sole basis for treatment. Nasal washings and  aspirates are unacceptable for Xpert Xpress SARS-CoV-2/FLU/RSV  testing. Fact Sheet for Patients: PinkCheek.be Fact Sheet for Healthcare Providers: GravelBags.it This test is not yet approved or cleared by the [place] FDA and  has been authorized for detection and/or diagnosis of SARS-CoV-2 by  FDA under an Emergency Use Authorization (EUA). This EUA will remain  in effect (meaning this test can be used) for the duration of the  Covid-19 declaration under Section 564(b)(1) of the Act, 21  U.S.C. section 360bbb-3(b)(1), unless the authorization is  terminated or revoked. Performed at [facility], [place] [address], [place], [place] [address]   Culture, blood (Routine X 2) w Reflex to ID Panel     Status: None (Preliminary result)   Collection Time: 08/19/19  1:57 AM   Specimen: BLOOD  Result Value Ref Range Status   Specimen Description BLOOD RIGHT ANTECUBITAL  Final   Special Requests   Final    BOTTLES DRAWN AEROBIC AND ANAEROBIC Blood Culture  adequate volume    Culture   Final    NO GROWTH 2 DAYS Performed at [facility], [place] [address], [place], [place] [address]    Report Status PENDING  Incomplete  Culture, blood (Routine X 2) w Reflex to ID Panel     Status: None (Preliminary result)   Collection Time: 08/19/19  2:14 AM   Specimen: BLOOD  Result Value Ref Range Status   Specimen Description BLOOD RIGHT ANTECUBITAL  Final   Special Requests   Final    BOTTLES DRAWN AEROBIC AND ANAEROBIC Blood Culture adequate volume   Culture   Final    NO GROWTH 2 DAYS Performed at [facility], [place] [address] [place],  [address]    Report Status PENDING  Incomplete     Time coordinating discharge: 34 minutes  SIGNED:   Hosie Poisson, MD  [facility] 08/22/2019, 10:40 AM

## 2019-08-22 NOTE — Care Management (Signed)
Requested RW to be delivered to room, and notified [facility] that patient will DC

## 2019-08-22 NOTE — Progress Notes (Signed)
Called patient's niece to update her on the patient's discharge and she stated she will not be able to help care for him at all. After speaking with Dr. Karleen Hampshire, she is requesting a [facility] consult for possible SNF placement. Discharge is on hold at this time.

## 2019-08-22 NOTE — Progress Notes (Addendum)
CSW received a request from [facility] RN CM/Pt's provider to follow up with pt's family as family wants SNF placement, per the notes.  Per chart review and the RTOC RN CM pt was given a recommendation for [facility] due to pt's demonstrating no skilleable need for rehab AEB the pt ambulating 150 feet with assistive device (rolling walker) and needing no assist, with only supervision was provided.  CSW will continue to follow for D/C needs.  Alphonse Guild. Adelma Bowdoin, LCSW, LCAS, CSI Transitions of Care Clinical Social Worker Care Coordination Department Ph: [phone]

## 2019-08-22 NOTE — Progress Notes (Addendum)
CSW updated by provider pt is ready for D/C and provider updated by Epic messaging that although pt's niece is saying she may not be able to care for the pt at home, the pt's niece is willing to be at the residence when pt arrives and that pt states he wants to return home and is refusing placement.  CSW will bring D/C packet to pt's RN and RN will update CSW when pt is ready for D/C and CSW can call [facility].  CSW will provide a taxi voucher to pt's RN in case [facility] does not transport pt's walker.  RN voiced understanding and will call pt's niece at D/C to let her know the pt is en route and that the pt's walker may also be en route. RN will advise taxi driver walker's owner has contact precautions and provide pt's driver with a pair of gloves if [facility] refuses to take the walker with the pt. CSW will continue to follow for D/C needs.  Alphonse Guild. Jayshon Dommer, LCSW, LCAS, CSI Transitions of Care Clinical Social Worker Care Coordination Department Ph: [phone]

## 2019-08-22 NOTE — Progress Notes (Signed)
Physical Therapy Treatment Patient Details Name: Lucas Fuller MRN: [id number] DOB: 14-Jan-1941 Today's Date: 08/22/2019    History of Present Illness Pt adm with PNA due to covid 19. PMH - aflutter s/p ablation, bladder CA, nonischemic cardiomyopathy, systolic heart failure, HTN, etoh disorder.    PT Comments    Pt making steady progress with mobility.    Follow Up Recommendations  Home health PT;Supervision - Intermittent     Equipment Recommendations  None recommended by PT    Recommendations for Other Services       Precautions / Restrictions Precautions Precautions: None Restrictions Weight Bearing Restrictions: No    Mobility  Bed Mobility Overal bed mobility: Modified Independent Bed Mobility: Supine to Sit     Supine to sit: Modified independent (Device/Increase time);HOB elevated     General bed mobility comments: Incr time  Transfers Overall transfer level: Needs assistance Equipment used: Rolling walker (2 wheeled);None Transfers: Sit to/from Stand Sit to Stand: Supervision         General transfer comment: Verbal cues for hand placement when using walker  Ambulation/Gait Ambulation/Gait assistance: Supervision;Min guard Gait Distance (Feet): 175 Feet Assistive device: Rolling walker (2 wheeled);None Gait Pattern/deviations: Step-through pattern;Decreased step length - right;Decreased step length - left Gait velocity: decr Gait velocity interpretation: <1.31 ft/sec, indicative of household ambulator General Gait Details: Assist for safety. When using walker verbal cues to stay closer to walker on turns and supervision. Pt amb in room without assistive device with min guard and using UE to touch/hold furniture.    Stairs             Wheelchair Mobility    Modified Rankin (Stroke Patients Only)       Balance Overall balance assessment: Needs assistance Sitting-balance support: No upper extremity supported;Feet supported Sitting  balance-Leahy Scale: Good     Standing balance support: No upper extremity supported;During functional activity Standing balance-Leahy Scale: Fair Standing balance comment: Pt stood/sat at sink for bath.                            Cognition Arousal/Alertness: Awake/alert Behavior During Therapy: WFL for tasks assessed/performed Overall Cognitive Status: No family/caregiver present to determine baseline cognitive functioning                                 General Comments: Slow with processing      Exercises      General Comments        Pertinent Vitals/Pain Pain Assessment: No/denies pain    Home Living                      Prior Function            PT Goals (current goals can now be found in the care plan section) Acute Rehab PT Goals Patient Stated Goal: return home Progress towards PT goals: Progressing toward goals    Frequency    Min 3X/week      PT Plan Current plan remains appropriate    Co-evaluation              AM-PAC PT "6 Clicks" Mobility   Outcome Measure  Help needed turning from your back to your side while in a flat bed without using bedrails?: None Help needed moving from lying on your back to sitting on the side of a flat bed  without using bedrails?: A Little Help needed moving to and from a bed to a chair (including a wheelchair)?: A Little Help needed standing up from a chair using your arms (e.g., wheelchair or bedside chair)?: None Help needed to walk in hospital room?: A Little Help needed climbing 3-5 steps with a railing? : A Little 6 Click Score: 20    End of Session   Activity Tolerance: Patient tolerated treatment well Patient left: in bed;with call bell/phone within reach;with nursing/sitter in room Nurse Communication: Mobility status PT Visit Diagnosis: Other abnormalities of gait and mobility (R26.89);Muscle weakness (generalized) (M62.81)     Time: [id number] PT Time  Calculation (min) (ACUTE ONLY): 37 min  Charges:  $Gait Training: 8-22 mins $Therapeutic Activity: 8-22 mins                     [place] Pager [phone] Office [place] 08/22/2019, 10:17 AM

## 2019-08-22 NOTE — Progress Notes (Signed)
CSW provided pt's RN with a D/C packet for the pt and instructions (on packet) for calling PTAR.  Pt's niece will arrive when the RN will call her to pick p pt's walker which will be taken by staff to the [facility] entrance to meet pt's niece.  RN will wait until PTAR arrives to call pt's niece, provide pt's niece with the walker and then then the neice will go to the pt's home to await PTAR and to be present when pt arrives.  RN will insure pt is aware of where his door keys are before he leaves so that the pt, the pt's nice and PTAR can insure pt is able to get into his home.  Per niece and per the notes, pt has his keys with him a the hospital as he brought them with him when he left.  RN has a Chief Strategy Officer with which to transport pt's walker if pt's niece is not able to bring it for some reason, as PTAR rarely takes it.  RN CM/EDP updated.    CSW will continue to follow for D/C needs.  Alphonse Guild. Karrington Studnicka, LCSW, LCAS, CSI Transitions of Care Clinical Social Worker Care Coordination Department Ph: [phone]

## 2019-08-22 NOTE — Progress Notes (Signed)
CSW received a call from pt's niece stating she can be at the pt's home when he arrives but that she cannot transport him.    CSW stated he would update her and pt's niece stated she could be at his home in about 15 minutes if updated pt was leaving.  CSW will continue to follow for D/C needs.  Alphonse Guild. Margrett Kalb, LCSW, LCAS, CSI Transitions of Care Clinical Social Worker Care Coordination Department Ph: [phone]

## 2019-08-22 NOTE — Progress Notes (Signed)
Lucas Fuller to be D/C'd home with home health per MD order. Discussed with the patient and all questions fully answered. Answered niece, [name]'s questions as well. VVS, Skin clean, dry and intact without evidence of skin break down, no evidence of skin tears noted.  IV catheter discontinued intact. Site without signs and symptoms of complications. Dressing and pressure applied.  An After Visit Summary was printed and given to the patient and PTAR.  Patient escorted via stretcher, and D/C home via PTAR. Walker to be picked up by [employer]. [name]  08/22/2019 6:48 PM

## 2019-08-22 NOTE — Progress Notes (Signed)
Occupational Therapy Treatment Patient Details Name: Lucas Fuller MRN: [id number] DOB: 10-09-1940 Today's Date: 08/22/2019    History of present illness Pt adm with PNA due to covid 19. PMH - aflutter s/p ablation, bladder CA, nonischemic cardiomyopathy, systolic heart failure, HTN, etoh disorder.   OT comments  Pt progressing towards OT goals, presents seated in recliner requiring min encouragement to participate in therapy session. Pt continues to present with some delayed processing. Overall completing room level mobility using RW and standing grooming ADL at supervision level. Pt with no DOE with room level activity. Feel POC remains appropriate at this time. Will continue to follow acutely.   Follow Up Recommendations  Home health OT;Supervision - Intermittent    Equipment Recommendations  None recommended by OT          Precautions / Restrictions Precautions Precautions: None Restrictions Weight Bearing Restrictions: No       Mobility Bed Mobility               General bed mobility comments: pt received OOB in recliner  Transfers Overall transfer level: Needs assistance Equipment used: Rolling walker (2 wheeled);None Transfers: Sit to/from Stand Sit to Stand: Supervision         General transfer comment: Verbal cues for hand placement when using walker    Balance Overall balance assessment: Needs assistance Sitting-balance support: No upper extremity supported;Feet supported Sitting balance-Leahy Scale: Good     Standing balance support: No upper extremity supported;During functional activity Standing balance-Leahy Scale: Fair Standing balance comment: Pt stood at sink for grooming ADL                           ADL either performed or assessed with clinical judgement   ADL Overall ADL's : Needs assistance/impaired     Grooming: Wash/dry hands;Supervision/safety;Standing                       Toileting- Clothing  Manipulation and Hygiene: Supervision/safety;Min guard;Sit to/from stand Toileting - Clothing Manipulation Details (indicate cue type and reason): pt stood at RW to void bladder (has catheter)     Functional mobility during ADLs: Min guard;Rolling walker                         Cognition Arousal/Alertness: Awake/alert Behavior During Therapy: WFL for tasks assessed/performed Overall Cognitive Status: No family/caregiver present to determine baseline cognitive functioning                                 General Comments: Slow with processing, pt reports needing to urinate during session but required cues/reminders that he currently has catheter         Exercises     Shoulder Instructions       General Comments      Pertinent Vitals/ Pain       Pain Assessment: No/denies pain  Home Living                                          Prior Functioning/Environment              Frequency  Min 2X/week        Progress Toward Goals  OT Goals(current goals can now be found  in the care plan section)  Progress towards OT goals: Progressing toward goals  Acute Rehab OT Goals Patient Stated Goal: return home OT Goal Formulation: With patient Time For Goal Achievement: 09/04/19 Potential to Achieve Goals: Good  Plan Discharge plan remains appropriate    Co-evaluation                 AM-PAC OT "6 Clicks" Daily Activity     Outcome Measure   Help from another person eating meals?: None Help from another person taking care of personal grooming?: A Little Help from another person toileting, which includes using toliet, bedpan, or urinal?: A Little Help from another person bathing (including washing, rinsing, drying)?: A Little Help from another person to put on and taking off regular upper body clothing?: A Little Help from another person to put on and taking off regular lower body clothing?: A Little 6 Click Score: 19     End of Session Equipment Utilized During Treatment: Rolling walker  OT Visit Diagnosis: Unsteadiness on feet (R26.81);Other abnormalities of gait and mobility (R26.89)   Activity Tolerance Patient tolerated treatment well   Patient Left in chair;with call bell/phone within reach;with chair alarm set   Nurse Communication Mobility status        Time: [id number] OT Time Calculation (min): 17 min  Charges: OT General Charges $OT Visit: 1 Visit OT Treatments $Self Care/Home Management : 8-22 mins  Lou Cal, [place] Pager [phone] Office [phone]    Raymondo Band 08/22/2019, 2:34 PM

## 2019-08-22 NOTE — Progress Notes (Addendum)
CSW called pt to request permission to speak to pt's niece and pt provided verbal permission.  CSW asked pt what his desire is regarding disposition and pt stated X 3 he wants to D/C home with [facility], that he feels safe returning home and that pt does not want to go to a SNF for short-term rehab.  Pt provided verbal permission to speak to pt's niece and update pt's niece as to pt's disposition, PT recommendation and pt's wishes.  12:23 PM CSW called pt's niece Alen Blew at ph: [phone] (no longer working) and ph: [phone],  left a VM requesting a call back.  12:39 PM CSW spoke to pt's nice who stated pt's niece is set to get her 2nd [place] vaccination on Feb 11th and as such she cannot care for the pt post D/C.  CSW explained to pt's niece that:  1.  Pt  Does not want to go to a SNF.  2.  Pt needs but did not receive the needed PT recommendation for SNF that would enable to pt's insurance [facility]) to consider an authorization for rehab days and that further SNF's will not consider a SNF day using pt's ins only without a [facility] recommendation.  3.  Pt stated x3 pt wants to go home with [facility] and that pt does not want to go to a SNF for rehab post-D/C.  Pt's niece states she has helped in the past but that this help only consisted of about 7 minutes and she has had PPE on (double-masked).  Pt's niece states that at this time she feels she can't expose herself further and that she would update pt's daughter who live in [place] and that the pt's nice feels the pt's family have, "put too much on me" and "expects too much of me" and that, "I just can't do it anymore". CSW will continue to follow for D/C needs.  Alphonse Guild. Halcyon Heck, LCSW, LCAS, CSI Transitions of Care Clinical Social Worker Care Coordination Department Ph: [phone]

## 2019-08-24 LAB — CULTURE, BLOOD (ROUTINE X 2)
Culture: NO GROWTH
Culture: NO GROWTH
Special Requests: ADEQUATE
Special Requests: ADEQUATE

## 2019-08-31 ENCOUNTER — Other Ambulatory Visit: Payer: Self-pay

## 2019-08-31 NOTE — Patient Outreach (Signed)
Red Emmi: Alert on 08/28/2019 Reason for alert:  Questions about discharge papers: yes  Reviewed medical record. Placed call to patient for home and work numbers.  Left message at home voicemail requesting a call back.  PLAN:will mail unable to reach letter and attempt again in 3 days.  Tomasa Rand, RN, BSN, CEN [facility] [employer] [phone]

## 2019-09-03 ENCOUNTER — Other Ambulatory Visit: Payer: Self-pay

## 2019-09-03 NOTE — Patient Outreach (Signed)
Red Emmi:  Placed call to patients home with no answer. Placed call to work number and patient has not been at work for 6 weeks.  PLAN: letter already mailed. Will attempt again in 3 days.  Tomasa Rand, RN, BSN, CEN [place] [employer] [phone]

## 2019-09-07 ENCOUNTER — Other Ambulatory Visit: Payer: Self-pay

## 2019-09-07 NOTE — Patient Outreach (Signed)
Red Emmi:  3rd call. Patient answered.  States he does not know if he has any questions or not. States that he has not looked at his paper work.    Patient reports if he has any question he will call me back.  PLAN: will close case as no needs identified   Tomasa Rand, RN, BSN, CEN [facility] [employer] [phone]

## 2019-09-24 ENCOUNTER — Ambulatory Visit: Payer: Medicare Other | Attending: Internal Medicine

## 2019-09-24 DIAGNOSIS — Z23 Encounter for immunization: Secondary | ICD-10-CM | POA: Insufficient documentation

## 2019-09-24 NOTE — Progress Notes (Signed)
   Covid-19 Vaccination Clinic  Name:  Lucas Fuller    MRN: [id number] DOB: 1941/03/01  09/24/2019  Lucas Fuller was observed post Covid-19 immunization for 15 minutes without incident. He was provided with Vaccine Information Sheet and instruction to access the V-Safe system.   Lucas Fuller was instructed to call 911 with any severe reactions post vaccine: Marland Kitchen Difficulty breathing  . Swelling of face and throat  . A fast heartbeat  . A bad rash all over body  . Dizziness and weakness   Immunizations Administered    Name Date Dose VIS Date Route   Pfizer COVID-19 Vaccine 09/24/2019  5:52 PM 0.3 mL 07/03/2019 Intramuscular   Manufacturer: [place]   Lot: [id number]   [place]: [id number]

## 2019-09-30 DIAGNOSIS — B351 Tinea unguium: Secondary | ICD-10-CM | POA: Diagnosis not present

## 2019-09-30 DIAGNOSIS — L84 Corns and callosities: Secondary | ICD-10-CM | POA: Diagnosis not present

## 2019-10-21 ENCOUNTER — Ambulatory Visit: Payer: Medicare Other | Attending: Internal Medicine

## 2019-10-21 DIAGNOSIS — Z23 Encounter for immunization: Secondary | ICD-10-CM

## 2019-10-21 NOTE — Progress Notes (Signed)
   Covid-19 Vaccination Clinic  Name:  Lucas Fuller    MRN: [id number] DOB: 1941/07/13  10/21/2019  Lucas Fuller was observed post Covid-19 immunization for 15 minutes without incident. He was provided with Vaccine Information Sheet and instruction to access the V-Safe system.   Lucas Fuller was instructed to call 911 with any severe reactions post vaccine: Marland Kitchen Difficulty breathing  . Swelling of face and throat  . A fast heartbeat  . A bad rash all over body  . Dizziness and weakness   Immunizations Administered    Name Date Dose VIS Date Route   Pfizer COVID-19 Vaccine 10/21/2019 12:10 PM 0.3 mL 07/03/2019 Intramuscular   Manufacturer: [place]   Lot: [id number]   [place]: [id number]

## 2019-12-14 ENCOUNTER — Telehealth: Payer: Self-pay | Admitting: Internal Medicine

## 2019-12-14 MED ORDER — FUROSEMIDE 40 MG PO TABS: 40.0000 mg | ORAL_TABLET | Freq: Every day | ORAL | 11 refills | Status: DC | PRN

## 2019-12-14 NOTE — Telephone Encounter (Signed)
Sent refill of Pt's [facility]urosemide to [facility].  Pt has follow up appt this week with RU.  Attempted to call Pt to advise a refill had been sent to his pharmacy, but call rang and could not be completed.  Unable to leave a message.

## 2019-12-14 NOTE — Telephone Encounter (Signed)
    Pt c/o swelling: STAT is pt has developed SOB within 24 hours  1) How much weight have you gained and in what time span? Not sure  2) If swelling, where is the swelling located? Both feet and legs  3) Are you currently taking a fluid pill? No  4) Are you currently SOB? Yes, since last week   5) Do you have a log of your daily weights (if so, list)? don't have one  6) Have you gained 3 pounds in a day or 5 pounds in a week? Not sure  7) Have you traveled recently? No  Pt said both of his feet and legs is swollen and would like to know if he can get fluid pill

## 2019-12-15 NOTE — Telephone Encounter (Signed)
Left message advising lasix refilled.  Advised to keep f/u appt

## 2019-12-17 DIAGNOSIS — B351 Tinea unguium: Secondary | ICD-10-CM | POA: Diagnosis not present

## 2019-12-17 DIAGNOSIS — L84 Corns and callosities: Secondary | ICD-10-CM | POA: Diagnosis not present

## 2019-12-18 ENCOUNTER — Ambulatory Visit: Payer: Medicare Other | Admitting: Physician Assistant

## 2019-12-18 ENCOUNTER — Other Ambulatory Visit: Payer: Self-pay

## 2019-12-18 ENCOUNTER — Telehealth: Payer: Self-pay | Admitting: Radiology

## 2019-12-18 VITALS — BP 138/88 | HR 67 | Ht 67.25 in | Wt 177.0 lb

## 2019-12-18 DIAGNOSIS — I480 Paroxysmal atrial fibrillation: Secondary | ICD-10-CM

## 2019-12-18 DIAGNOSIS — Z79899 Other long term (current) drug therapy: Secondary | ICD-10-CM | POA: Diagnosis not present

## 2019-12-18 DIAGNOSIS — I5043 Acute on chronic combined systolic (congestive) and diastolic (congestive) heart failure: Secondary | ICD-10-CM

## 2019-12-18 DIAGNOSIS — I4892 Unspecified atrial flutter: Secondary | ICD-10-CM | POA: Diagnosis not present

## 2019-12-18 DIAGNOSIS — I5042 Chronic combined systolic (congestive) and diastolic (congestive) heart failure: Secondary | ICD-10-CM | POA: Diagnosis not present

## 2019-12-18 MED ORDER — CARVEDILOL 3.125 MG PO TABS: 3.1250 mg | ORAL_TABLET | Freq: Two times a day (BID) | ORAL | 1 refills | Status: DC

## 2019-12-18 MED ORDER — FUROSEMIDE 40 MG PO TABS: 40.0000 mg | ORAL_TABLET | Freq: Every day | ORAL | 11 refills | Status: DC

## 2019-12-18 MED ORDER — POTASSIUM CHLORIDE CRYS ER 20 MEQ PO TBCR: 20.0000 meq | EXTENDED_RELEASE_TABLET | Freq: Every day | ORAL | 6 refills | Status: DC | PRN

## 2019-12-18 MED ORDER — POTASSIUM CHLORIDE CRYS ER 20 MEQ PO TBCR: 20.0000 meq | EXTENDED_RELEASE_TABLET | Freq: Every day | ORAL | 6 refills | Status: DC

## 2019-12-18 NOTE — Telephone Encounter (Signed)
Enrolled patient for a 14 day Zio monitor to be mailed to patients home.  

## 2019-12-18 NOTE — Patient Instructions (Signed)
Medication Instructions:   START TAKING LASIX 40 MG ONCE A DAY   START TAKING POTASSIUM 20 MEQ ONCE A DAY   *If you need a refill on your cardiac medications before your next appointment, please call your pharmacy*   Lab Work:  BMET TODAY    If you have labs (blood work) drawn today and your tests are completely normal, you will receive your results only by: Marland Kitchen MyChart Message (if you have MyChart) OR . A paper copy in the mail If you have any lab test that is abnormal or we need to change your treatment, we will call you to review the results.   Testing/Procedures: Your physician has requested that you have an echocardiogram. Echocardiography is a painless test that uses sound waves to create images of your heart. It provides your doctor with information about the size and shape of your heart and how well your heart's chambers and valves are working. This procedure takes approximately one hour. There are no restrictions for this procedure.   Your physician has recommended that you wear an event monitor. Event monitors are medical devices that record the heart's electrical activity. Doctors most often [place] these monitors to diagnose arrhythmias. Arrhythmias are problems with the speed or rhythm of the heartbeat. The monitor is a small, portable device. You can wear one while you do your normal daily activities. This is usually used to diagnose what is causing palpitations/syncope (passing out).   Follow-Up: At [facility], you and your health needs are our priority.  As part of our continuing mission to provide you with exceptional heart care, we have created designated Provider Care Teams.  These Care Teams include your primary Cardiologist (physician) and Advanced Practice Providers (APPs -  Physician Assistants and Nurse Practitioners) who all work together to provide you with the care you need, when you need it.  We recommend signing up for the patient portal called "MyChart".  Sign up  information is provided on this After Visit Summary.  MyChart is used to connect with patients for Virtual Visits (Telemedicine).  Patients are able to view lab/test results, encounter notes, upcoming appointments, etc.  Non-urgent messages can be sent to your provider as well.   To learn more about what you can do with MyChart, go to [email or web address].    Your next appointment:   1 week(s)    The format for your next appointment:   In Person  Provider:   You may see one of the following Advanced Practice Providers on your designated Care Team:    Tommye Standard, [place]  Legrand Como "Jonni Sanger" Chalmers Cater, [place]    Other Instructions  Bryn Gulling- Long Term Monitor Instructions   Your physician has requested you wear your ZIO patch monitor___14 DAYS ____days.   This is a single patch monitor.  [employer] supplies one patch monitor per enrollment.  Additional stickers are not available.   Please do not apply patch if you will be having a Nuclear Stress Test, Echocardiogram, Cardiac CT, MRI, or Chest Xray during the time frame you would be wearing the monitor. The patch cannot be worn during these tests.  You cannot remove and re-apply the ZIO XT patch monitor.   Your ZIO patch monitor will be sent USPS Priority mail from [facility] directly to your home address. The monitor may also be mailed to a PO BOX if home delivery is not available.   It may take 3-5 days to receive your monitor after you have been enrolled.  Once you have received you monitor, please review enclosed instructions.  Your monitor has already been registered assigning a specific monitor serial # to you.   Applying the monitor   Shave hair from upper left chest.   Hold abrader disc by orange tab.  Rub abrader in 40 strokes over left upper chest as indicated in your monitor instructions.   Clean area with 4 enclosed alcohol pads .  Use all pads to assure are is cleaned thoroughly.  Let dry.   Apply patch as indicated  in monitor instructions.  Patch will be place under collarbone on left side of chest with arrow pointing upward.   Rub patch adhesive wings for 2 minutes.Remove white label marked "1".  Remove white label marked "2".  Rub patch adhesive wings for 2 additional minutes.   While looking in a mirror, press and release button in center of patch.  A small green light will flash 3-4 times .  This will be your only indicator the monitor has been turned on.     Do not shower for the first 24 hours.  You may shower after the first 24 hours.   Press button if you feel a symptom. You will hear a small click.  Record Date, Time and Symptom in the Patient Log Book.   When you are ready to remove patch, follow instructions on last 2 pages of Patient Log Book.  Stick patch monitor onto last page of Patient Log Book.   Place Patient Log Book in [place] box.  Use locking tab on box and tape box closed securely.  The [employer] has [employer] on it.  Please place in mailbox as soon as possible.  Your physician should have your test results approximately 7 days after the monitor has been mailed back to [facility].   Call [place] at [phone] if you have questions regarding your ZIO XT patch monitor.  Call them immediately if you see an orange light blinking on your monitor.   If your monitor falls off in less than 4 days contact our Monitor department at [phone].  If your monitor becomes loose or falls off after 4 days call [employer] at [phone] for suggestions on securing your monitor.

## 2019-12-18 NOTE — Progress Notes (Signed)
Cardiology Office Note Date:  12/18/2019  Patient ID:  Lucas Fuller, Lucas Fuller 1940-12-07, MRN [id number] PCP:  Merrilee Seashore, MD  Cardiologist:  Dr. Angelena Form (2017 last) EP: Dr. Lovena Le     Chief Complaint: recent edema, SOB  History of Present Illness: Lucas Fuller is a 79 y.o. male with history of atrial flutter ablation, nonischemic cardiomyopathy with LV dysfunction EF previously 20 to 25%. Normal coronary arteries on cath in 2017 saw Dr. Lovena Le 01/28/2017 at which time the patient stopped his Eliquis and was continued on low-dose aspirin. Repeat 2D echo 02/14/2017 LVEF improved to 40 to 45% with mild diffuse hypokinesis and grade 1 DD Hypothyroidism, chronic CHF (combined) ETOH abuse Bladder ca s/p TURBT  TTE in 2019, LVEF 40-45%    He comes in today to be seen for Dr. Lovena Le, last seen by him July 2020, discussed h/o intermittently running out of his medicines, , uses lasix prn (with K+).  Reported "occasionally gets SOB with moderate exertion. He denies chest pain,  dizziness, or palpitations. He fell off of his porch last week after sitting out the heat and getting up to quickly, but denies injury" No changes were made, planned fo 6 mo follow up.  Hospitalized 08/18/2019 COVID pneumonia.  Found in rate controlled AFib felt 2/2 acute illness, started on heparin Discharged 08/21/19 with home health In regards to AFib, notes report that the family declined a/c, mentioning h/o falls  Phone notes this week with reports of edema, his lasix refilled.  TODAY He states that for about 81mo now he has been swelling I his legs, with about a month or so of SOB.  He reports that he did feel better after his hospital stay.  He seems surprised though that he had COVID, doesn't think anyone told him that.  He just knew he felt really SOB and bad.  He denies any CP or palpitations.  His SOB is noted mostly when bending over to get his shoes on and when walking fast/or far especially.  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  He is on his feet all day and does get SOB walking around.  No rest SOB, he denies SOB when in bed or sleeping.  No near syncope or syncope.  He has fallen, says the last time was probably about a year ago, maybe less then that. Stopped gardening because he would tend to trip/fall in the yard, doesn't think he picks up his feet well. 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  He quit drinking New Years day until about a month ago.  Limits himself to one shot 3 nights a week only.  He denies getting drunk.  He does not smoke.   In the last month he has knly taken the lasix once or twice, did not seem to help the way it used to and is hard with work, and then taking at night keeps him up.    Past Medical History:  Diagnosis Date  . Atrial fibrillation, persistent ([place])   . Bladder cancer ([place])   . Chronic systolic CHF (congestive heart failure) ([place])   . History of blood transfusion   . Hypothyroidism   . Migraine    "years ago" (05/17/2016)  . Non-ischemic cardiomyopathy ([place])   . Thyroid disease     Past Surgical History:  Procedure Laterality Date  . CARDIAC CATHETERIZATION N/A 12/08/2015   Procedure: Left Heart Cath and Coronary Angiography;  Surgeon: Pearletha Forge  Gwenlyn Found, MD;  Location: [facility];  Service: Cardiovascular;  Laterality: N/A;  . CYSTOSCOPY WITH FULGERATION  2008   Archie Endo 11/23/2010  . ELECTROPHYSIOLOGIC STUDY N/A 05/17/2016   Procedure: A-Flutter Ablation;  Surgeon: Evans Lance, MD;  Location: [facility];  Service: Cardiovascular;  Laterality: N/A;  . TONSILLECTOMY    . TRANSURETHRAL RESECTION OF BLADDER  2008   Archie Endo 11/23/2010    Current Outpatient Medications  Medication Sig Dispense Refill  . albuterol (VENTOLIN HFA) 108 (90 Base) MCG/ACT inhaler Inhale 2 puffs into the lungs every 4 (four) hours as needed for wheezing or shortness of breath. 18 g 0  . ascorbic acid (VITAMIN C) 500 MG tablet  Take 1 tablet (500 mg total) by mouth daily.    Marland Kitchen aspirin EC 81 MG tablet Take 1 tablet (81 mg total) by mouth daily. 30 tablet 6  . carvedilol (COREG) 3.125 MG tablet Take 1 tablet (3.125 mg total) by mouth 2 (two) times daily with a meal. 99991111 tablet 1  . folic acid (FOLVITE) 1 MG tablet Take 1 tablet (1 mg total) by mouth daily. 30 tablet 11  . furosemide (LASIX) 40 MG tablet Take 1 tablet (40 mg total) by mouth daily. 30 tablet 11  . guaiFENesin-dextromethorphan (ROBITUSSIN DM) 100-10 MG/5ML syrup Take 10 mLs by mouth every 4 (four) hours as needed for cough. 118 mL 0  . levothyroxine (SYNTHROID, LEVOTHROID) 50 MCG tablet Take 50 mcg by mouth daily before breakfast.    . Multiple Vitamin (MULTIVITAMIN WITH MINERALS) TABS tablet Take 1 tablet by mouth daily.    . potassium chloride SA (KLOR-CON M20) 20 MEQ tablet Take 1 tablet (20 mEq total) by mouth daily. 30 tablet 6   No current facility-administered medications for this visit.    Allergies:   Asa [aspirin]   Social History:  The patient  reports that he has never smoked. He has never used smokeless tobacco. He reports current alcohol use of about 6.0 standard drinks of alcohol per week. He reports that he does not use drugs.   Family History:  The patient's family history includes CVA in his sister; Diabetes in his sister; Heart attack (age of onset: 80) in his father; Hypertension in his sister; Kidney disease in his sister; Lung cancer in his sister.  ROS:  Please see the history of present illness.    All other systems are reviewed and otherwise negative.   PHYSICAL EXAM:  VS:  BP 138/88   Pulse 67   Ht 5' 7.25" (1.708 m)   Wt 177 lb (80.3 kg)   BMI 27.52 kg/m  BMI: Body mass index is 27.52 kg/m. Well nourished, well developed, in no acute distress  HEENT: normocephalic, atraumatic  Neck: no JVD, carotid bruits or masses Cardiac:  RRR; no significant murmurs, no rubs, or gallops Lungs:  CTA b/l, no wheezing, rhonchi or  rales  Abd: soft, nontender MS: no deformity or atrophy Ext: he has marked edema to just below his knees b/l.  No erythema, wounds  Skin: warm and dry, no rash Neuro:  No gross deficits appreciated Psych: euthymic mood, full affect   EKG:  Done today and reviewed by myself shows  SR 67bpm, 1st degree AVblock, PR 255ms, LAD   2D echo 07/02/2018 Study Conclusions - Left ventricle: There was moderate concentric hypertrophy. Systolic function was mildly to moderately reduced. The estimated ejection fraction was in the range of 40% to 45%. Diffuse hypokinesis. Although no diagnostic  regional wall motion abnormality was identified, this possibility cannot be completely excluded on the basis of this study. Doppler parameters are consistent with abnormal left ventricular relaxation (grade 1 diastolic dysfunction). - Aortic valve: Trileaflet; moderately calcified leaflets, predominantly noncoronary cusp. There was mild regurgitation. - Mitral valve: Calcified annulus. There was mild regurgitation. Valve area by pressure half-time: 2.5 cm^2. - Left atrium: The atrium was mildly dilated. - Atrial septum: The septum bowed from left to right, consistent with increased left atrial pressure. There was a possible, very small patent foramen ovale.  Impressions: - No significant change from prior.    12/08/2015: [facility] IMPRESSION:Mr. Lovena Le has a left dominant system, clean coronary arteries and a dilated severely hypocontractile LV consistent with nonischemic cardiomyopathy. His groin was sealed with a "MYNX " device. He will continue medical therapy. He left the lab in stable condition   Recent Labs: 08/18/2019: B Natriuretic Peptide 303.7; TSH 3.330 08/19/2019: Magnesium 2.3 08/22/2019: ALT 29; BUN 25; Creatinine, Ser 0.76; Hemoglobin 14.6; Platelets 183; Potassium 4.4; Sodium 136  08/18/2019: Triglycerides 103   CrCl cannot be calculated (Patient's most recent lab  result is older than the maximum 21 days allowed.).   Wt Readings from Last 3 Encounters:  12/18/19 177 lb (80.3 kg)  08/19/19 151 lb 7.3 oz (68.7 kg)  02/05/19 174 lb 9.6 oz (79.2 kg)     Other studies reviewed: Additional studies/records reviewed today include: summarized above  ASSESSMENT AND PLAN:  1. AFlutter      Ablated 2017 >> subsequently off a/c 2. New Afib     Found during COVID hospitalization     CHA2DS2Vasc is 3, family declined a/c      He has no palpitations.  He is AAO x4. Cautioned him on ETOH, advised to stop drinking Discussed AFib and indication for blood thinners, though did occur during an acute/significant illness.  He did have AFlutter and certainly could have developed AFib regardless.  3. NICM 4. Chronic CHF (combined) He is markedly volume OL.  Is in no distress and very comfortable at rest with no symptoms of PND or orthopnea.   We discussed all of the above.    Take lasix 40mg  daily as soon as he gets home from work so hopefully mush of his urinating occurs before he goes to bed. Take his K+ daily as well BMET today Update his echo Will start with a Zio XT to see if he is having any AFib.  Might consider loop if none.   Disposition: F/u in a week to monitor his response to daily lasix, check labs and make adjustments to plan if needed.    Current medicines are reviewed at length with the patient today.  The patient did not have any concerns regarding medicines.  Venetia Night, PA-C 12/18/2019 3:40 PM     [place] [address] [phone] (office)  [phone] (fax)

## 2019-12-19 LAB — BASIC METABOLIC PANEL
BUN/Creatinine Ratio: 15 (ref 10–24)
BUN: 15 mg/dL (ref 8–27)
CO2: 21 mmol/L (ref 20–29)
Calcium: 8.9 mg/dL (ref 8.6–10.2)
Chloride: 108 mmol/L — ABNORMAL HIGH (ref 96–106)
Creatinine, Ser: 1.01 mg/dL (ref 0.76–1.27)
GFR calc Af Amer: 81 mL/min/{1.73_m2} (ref >59)
GFR calc non Af Amer: 70 mL/min/{1.73_m2} (ref >59)
Glucose: 89 mg/dL (ref 65–99)
Potassium: 3.9 mmol/L (ref 3.5–5.2)
Sodium: 144 mmol/L (ref 134–144)

## 2019-12-25 ENCOUNTER — Other Ambulatory Visit (INDEPENDENT_AMBULATORY_CARE_PROVIDER_SITE_OTHER): Payer: Medicare Other

## 2019-12-25 DIAGNOSIS — I480 Paroxysmal atrial fibrillation: Secondary | ICD-10-CM | POA: Diagnosis not present

## 2019-12-29 NOTE — Progress Notes (Signed)
Cardiology Office Note Date:  12/31/2019  Patient ID:  Lucas Fuller, Lucas Fuller 1941/06/04, MRN [id number] PCP:  Merrilee Seashore, MD  Cardiologist:  Dr. Angelena Form (2017 last) EP: Dr. Lovena Le     Chief Complaint:  planned f/u  History of Present Illness: Lucas Fuller is a 79 y.o. male with history of atrial flutter ablation, nonischemic cardiomyopathy with LV dysfunction EF previously 20 to 25%. Normal coronary arteries on cath in 2017 saw Dr. Lovena Le 01/28/2017 at which time the patient stopped his Eliquis and was continued on low-dose aspirin. Repeat 2D echo 02/14/2017 LVEF improved to 40 to 45% with mild diffuse hypokinesis and grade 1 DD Hypothyroidism, chronic CHF (combined) ETOH abuse Bladder ca s/p TURBT  TTE in 2019, LVEF 40-45%   He comes in today to be seen for Dr. Lovena Le, last seen by him July 2020, discussed h/o intermittently running out of his medicines, , uses lasix prn (with K+).  Reported "occasionally gets SOB with moderate exertion. He denies chest pain,  dizziness, or palpitations. He fell off of his porch last week after sitting out the heat and getting up to quickly, but denies injury" No changes were made, planned fo 6 mo follow up.  Hospitalized 08/18/2019 COVID pneumonia.  Found in rate controlled AFib felt 2/2 acute illness, started on heparin Discharged 08/21/19 with home health In regards to AFib, notes report that the family declined a/c, mentioning h/o falls  Phone notes this week with reports of edema, his lasix refilled.  TODAY He states that for about 25mo now he has been swelling in his legs, with about a month or so of SOB.  He reports that he did feel better after his hospital stay.  He seems surprised though that he had COVID, doesn't think anyone told him that.  He just knew he felt really SOB and bad.  He denies any CP or palpitations.  His SOB is noted mostly when bending over to get his shoes on and when walking fast/or far especially.  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  He is on his feet all day and does get SOB walking around.  No rest SOB, he denies SOB when in bed or sleeping.  No near syncope or syncope.  He has fallen, says the last time was probably about a year ago, maybe less then that. Stopped gardening because he would tend to trip/fall in the yard, doesn't think he picks up his feet well. 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  He quit drinking New Years day until about a month ago.  Limits himself to one shot 3 nights a week only.  He denies getting drunk.  He does not smoke.   In the last month he has only taken the lasix once or twice, did not seem to help the way it used to and is hard with work, and then taking at night keeps him up.  He was found to be AAO x4 (noted given family seemed to make at least some decisions in the hospital) Discussed AFib and indication for blood thinners, though did occur during an acute/significant illness Take lasix 40mg  daily as soon as he gets home from work so hopefully much of his urinating occurs before he goes to bed. Take his K+ daily as well BMET today Update his echo Will start with a Zio XT to see if he is having any AFib.  Might consider loop if none.  TODAY He  has noted his swelling is improved, also has had to bring his belt in a notch.  His legs remain swollen and heavy but are better. No CP, palpitations.  No rest SOB, no symptoms of PMD or orthopnea.  He has some mild DOE with work and ADLs. No dizzy spells, near syncope or syncope.    Past Medical History:  Diagnosis Date  . Atrial fibrillation, persistent ([place])   . Bladder cancer ([place])   . Chronic systolic CHF (congestive heart failure) ([place])   . History of blood transfusion   . Hypothyroidism   . Migraine    "years ago" (05/17/2016)  . Non-ischemic cardiomyopathy ([place])   . Thyroid disease     Past Surgical History:  Procedure Laterality Date  . CARDIAC CATHETERIZATION N/A  12/08/2015   Procedure: Left Heart Cath and Coronary Angiography;  Surgeon: Lorretta Harp, MD;  Location: [facility];  Service: Cardiovascular;  Laterality: N/A;  . CYSTOSCOPY WITH FULGERATION  2008   Archie Endo 11/23/2010  . ELECTROPHYSIOLOGIC STUDY N/A 05/17/2016   Procedure: A-Flutter Ablation;  Surgeon: Evans Lance, MD;  Location: [facility];  Service: Cardiovascular;  Laterality: N/A;  . TONSILLECTOMY    . TRANSURETHRAL RESECTION OF BLADDER  2008   Archie Endo 11/23/2010    Current Outpatient Medications  Medication Sig Dispense Refill  . albuterol (VENTOLIN HFA) 108 (90 Base) MCG/ACT inhaler Inhale 2 puffs into the lungs every 4 (four) hours as needed for wheezing or shortness of breath. 18 g 0  . ascorbic acid (VITAMIN C) 500 MG tablet Take 1 tablet (500 mg total) by mouth daily.    Marland Kitchen aspirin EC 81 MG tablet Take 1 tablet (81 mg total) by mouth daily. 30 tablet 6  . carvedilol (COREG) 3.125 MG tablet Take 1 tablet (3.125 mg total) by mouth 2 (two) times daily with a meal. 737 tablet 1  . folic acid (FOLVITE) 1 MG tablet Take 1 tablet (1 mg total) by mouth daily. 30 tablet 11  . furosemide (LASIX) 40 MG tablet Take 1 tablet (40 mg total) by mouth daily. 30 tablet 11  . guaiFENesin-dextromethorphan (ROBITUSSIN DM) 100-10 MG/5ML syrup Take 10 mLs by mouth every 4 (four) hours as needed for cough. 118 mL 0  . levothyroxine (SYNTHROID, LEVOTHROID) 50 MCG tablet Take 50 mcg by mouth daily before breakfast.    . Multiple Vitamin (MULTIVITAMIN WITH MINERALS) TABS tablet Take 1 tablet by mouth daily.    . potassium chloride SA (KLOR-CON M20) 20 MEQ tablet Take 1 tablet (20 mEq total) by mouth daily. 30 tablet 6   No current facility-administered medications for this visit.    Allergies:   Asa [aspirin]   Social History:  The patient  reports that he has never smoked. He has never used smokeless tobacco. He reports current alcohol use of about 6.0 standard drinks of alcohol per  week. He reports that he does not use drugs.   Family History:  The patient's family history includes CVA in his sister; Diabetes in his sister; Heart attack (age of onset: 32) in his father; Hypertension in his sister; Kidney disease in his sister; Lung cancer in his sister.  ROS:  Please see the history of present illness.    All other systems are reviewed and otherwise negative.   PHYSICAL EXAM:  VS:  BP 124/78   Pulse 81   Ht 5' 7.25" (1.708 m)   Wt 169 lb (76.7 kg)   SpO2 97%  BMI 26.27 kg/m  BMI: Body mass index is 26.27 kg/m. Well nourished, well developed, in no acute distress  HEENT: normocephalic, atraumatic  Neck: no JVD, carotid bruits or masses Cardiac:  RRR; no significant murmurs, no rubs, or gallops Lungs:  CTA b/l, no wheezing, rhonchi or rales  Abd: soft, nontender MS: no deformity or atrophy Ext: c/w 2++ edema to about mid shin,  No erythema, wounds or weeping Skin: warm and dry, no rash Neuro:  No gross deficits appreciated Psych: euthymic mood, full affect   EKG:  12/18/19  SR 67bpm, 1st degree AVblock, PR 262ms, LAD   2D echo 07/02/2018 Study Conclusions - Left ventricle: There was moderate concentric hypertrophy. Systolic function was mildly to moderately reduced. The estimated ejection fraction was in the range of 40% to 45%. Diffuse hypokinesis. Although no diagnostic regional wall motion abnormality was identified, this possibility cannot be completely excluded on the basis of this study. Doppler parameters are consistent with abnormal left ventricular relaxation (grade 1 diastolic dysfunction). - Aortic valve: Trileaflet; moderately calcified leaflets, predominantly noncoronary cusp. There was mild regurgitation. - Mitral valve: Calcified annulus. There was mild regurgitation. Valve area by pressure half-time: 2.5 cm^2. - Left atrium: The atrium was mildly dilated. - Atrial septum: The septum bowed from left to right,  consistent with increased left atrial pressure. There was a possible, very small patent foramen ovale.  Impressions: - No significant change from prior.    12/08/2015: [facility] IMPRESSION:Mr. Lovena Le has a left dominant system, clean coronary arteries and a dilated severely hypocontractile LV consistent with nonischemic cardiomyopathy. His groin was sealed with a "MYNX " device. He will continue medical therapy. He left the lab in stable condition   Recent Labs: 08/18/2019: B Natriuretic Peptide 303.7; TSH 3.330 08/19/2019: Magnesium 2.3 08/22/2019: ALT 29; Hemoglobin 14.6; Platelets 183 12/18/2019: BUN 15; Creatinine, Ser 1.01; Potassium 3.9; Sodium 144  08/18/2019: Triglycerides 103   Estimated Creatinine Clearance: 56 mL/min (by C-G formula based on SCr of 1.01 mg/dL).   Wt Readings from Last 3 Encounters:  12/31/19 169 lb (76.7 kg)  12/18/19 177 lb (80.3 kg)  08/19/19 151 lb 7.3 oz (68.7 kg)     Other studies reviewed: Additional studies/records reviewed today include: summarized above  ASSESSMENT AND PLAN:  1. AFlutter      Ablated 2017 >> subsequently off a/c 2. New Afib     Found during COVID hospitalization     CHA2DS2Vasc is 3, family declined a/c    He wore the monitor, currently is "in transit" back to [facility] for processing       3. NICM 4. Chronic CHF (combined)     Echo is scheduled for next week.  Edema is improving weight is down 8lbs Continue same BMET today   Disposition: see him back in about 3 weeks follow up on echo diuresis.  Pending BMET today if any adjustments are needed.     Current medicines are reviewed at length with the patient today.  The patient did not have any concerns regarding medicines.  Venetia Night, PA-C 12/31/2019 8:35 AM     [address] [place] [address] [phone] (office)  [phone] (fax)

## 2019-12-31 ENCOUNTER — Other Ambulatory Visit: Payer: Self-pay

## 2019-12-31 ENCOUNTER — Ambulatory Visit: Payer: Medicare Other | Admitting: Physician Assistant

## 2019-12-31 VITALS — BP 124/78 | HR 81 | Ht 67.25 in | Wt 169.0 lb

## 2019-12-31 DIAGNOSIS — I5043 Acute on chronic combined systolic (congestive) and diastolic (congestive) heart failure: Secondary | ICD-10-CM | POA: Diagnosis not present

## 2019-12-31 DIAGNOSIS — I480 Paroxysmal atrial fibrillation: Secondary | ICD-10-CM

## 2019-12-31 DIAGNOSIS — I428 Other cardiomyopathies: Secondary | ICD-10-CM | POA: Diagnosis not present

## 2019-12-31 DIAGNOSIS — Z79899 Other long term (current) drug therapy: Secondary | ICD-10-CM | POA: Diagnosis not present

## 2019-12-31 LAB — BASIC METABOLIC PANEL
BUN/Creatinine Ratio: 17 (ref 10–24)
BUN: 14 mg/dL (ref 8–27)
CO2: 24 mmol/L (ref 20–29)
Calcium: 8.9 mg/dL (ref 8.6–10.2)
Chloride: 102 mmol/L (ref 96–106)
Creatinine, Ser: 0.84 mg/dL (ref 0.76–1.27)
GFR calc Af Amer: 96 mL/min/{1.73_m2} (ref >59)
GFR calc non Af Amer: 83 mL/min/{1.73_m2} (ref >59)
Glucose: 107 mg/dL — ABNORMAL HIGH (ref 65–99)
Potassium: 4.2 mmol/L (ref 3.5–5.2)
Sodium: 139 mmol/L (ref 134–144)

## 2019-12-31 NOTE — Patient Instructions (Signed)
Medication Instructions:    *If you need a refill on your cardiac medications before your next appointment, please call your pharmacy*   Lab Work: BMET TODAY    If you have labs (blood work) drawn today and your tests are completely normal, you will receive your results only by: Marland Kitchen MyChart Message (if you have MyChart) OR . A paper copy in the mail If you have any lab test that is abnormal or we need to change your treatment, we will call you to review the results.   Testing/Procedures: NONE ORDERED  TODAY     Follow-Up: At [facility], you and your health needs are our priority.  As part of our continuing mission to provide you with exceptional heart care, we have created designated Provider Care Teams.  These Care Teams include your primary Cardiologist (physician) and Advanced Practice Providers (APPs -  Physician Assistants and Nurse Practitioners) who all work together to provide you with the care you need, when you need it.  We recommend signing up for the patient portal called "MyChart".  Sign up information is provided on this After Visit Summary.  MyChart is used to connect with patients for Virtual Visits (Telemedicine).  Patients are able to view lab/test results, encounter notes, upcoming appointments, etc.  Non-urgent messages can be sent to your provider as well.   To learn more about what you can do with MyChart, go to [email or web address].    Your next appointment:   2 week(s)  The format for your next appointment:   In Person  Provider: You may se         Tommye Standard, PA-C         [place] [place] "[place], [place]    Other Instructions

## 2020-01-07 ENCOUNTER — Ambulatory Visit (HOSPITAL_COMMUNITY): Payer: Medicare Other | Attending: Physician Assistant

## 2020-01-07 ENCOUNTER — Encounter (INDEPENDENT_AMBULATORY_CARE_PROVIDER_SITE_OTHER): Payer: Self-pay

## 2020-01-07 ENCOUNTER — Other Ambulatory Visit: Payer: Self-pay

## 2020-01-07 DIAGNOSIS — I5042 Chronic combined systolic (congestive) and diastolic (congestive) heart failure: Secondary | ICD-10-CM | POA: Insufficient documentation

## 2020-01-13 DIAGNOSIS — I480 Paroxysmal atrial fibrillation: Secondary | ICD-10-CM | POA: Diagnosis not present

## 2020-01-27 NOTE — Progress Notes (Signed)
Cardiology Office Note Date:  01/27/2020  Patient ID:  Lucas Fuller 04-10-1941, MRN [id number] PCP:  Merrilee Seashore, MD  Cardiologist:  Dr. Angelena Form (2017 last) EP: Dr. Lovena Le     Chief Complaint:  planned f/u  History of Present Illness: Lucas Fuller is a 79 y.o. male with history of atrial flutter ablation, nonischemic cardiomyopathy with LV dysfunction EF previously 20 to 25%. Normal coronary arteries on cath in 2017 saw Dr. Lovena Le 01/28/2017 at which time the patient stopped his Eliquis and was continued on low-dose aspirin. Repeat 2D echo 02/14/2017 LVEF improved to 40 to 45% with mild diffuse hypokinesis and grade 1 DD Hypothyroidism, chronic CHF (combined) ETOH abuse Bladder ca s/p TURBT  TTE in 2019, LVEF 40-45%   He comes in today to be seen for Dr. Lovena Le, last seen by him July 2020, discussed h/o intermittently running out of his medicines, , uses lasix prn (with K+).  Reported "occasionally gets SOB with moderate exertion. He denies chest pain,  dizziness, or palpitations. He fell off of his porch last week after sitting out the heat and getting up to quickly, but denies injury" No changes were made, planned fo 6 mo follow up.  Hospitalized 08/18/2019 COVID pneumonia.  Found in rate controlled AFib felt 2/2 acute illness, started on heparin Discharged 08/21/19 with home health In regards to AFib, notes report that the family declined a/c, mentioning h/o falls  Phone notes this week with reports of edema, his lasix refilled.  12/18/2019, I saw him  He states that for about 47mo now he has been swelling in his legs, with about a month or so of SOB.  He reports that he did feel better after his hospital stay.  He seems surprised though that he had COVID, doesn't think anyone told him that.  He just knew he felt really SOB and bad.  He denies any CP or palpitations.  His SOB is noted mostly when bending over to get his shoes on and when walking fast/or far  especially.  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  He is on his feet all day and does get SOB walking around.  No rest SOB, he denies SOB when in bed or sleeping.  No near syncope or syncope.  He has fallen, says the last time was probably about a year ago, maybe less then that. Stopped gardening because he would tend to trip/fall in the yard, doesn't think he picks up his feet well. 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  He quit drinking New Years day until about a month ago.  Limits himself to one shot 3 nights a week only.  He denies getting drunk.  He does not smoke.   In the last month he has only taken the lasix once or twice, did not seem to help the way it used to and is hard with work, and then taking at night keeps him up.  He was found to be AAO x4 (noted given family seemed to make at least some decisions in the hospital) Discussed AFib and indication for blood thinners, though did occur during an acute/significant illness Take lasix 40mg  daily as soon as he gets home from work so hopefully much of his urinating occurs before he goes to bed. Take his K+ daily as well BMET today Update his echo Will start with a Zio XT to see if he is having any AFib.  Might consider loop if  none.  12/31/2019 I saw him He has noted his swelling is improved, also has had to bring his belt in a notch.  His legs remain swollen and heavy but are better. No CP, palpitations.  No rest SOB, no symptoms of PMD or orthopnea.  He has some mild DOE with work and ADLs. No dizzy spells, near syncope or syncope. weight is down 8lbs Continue same BMET today Monitor was in transit, echo was pending, continued lasix, BMET done (renal function and K+ were stable)  TTE noted  LVEF 30-35% (from 40-45%), started on low dose losartan w h/o historically only being able to tolerate low dose ACE. 24hr monitor noted  1. NSR with sinus brady and sinus tachycardia 2. occaisional PAC's and rare  PVC's 3. NS atrial tachycardia.  4. Nocturnal bradycardia   TODAY He had his remaining upper teeth removed last week, healing up from that.  He denies any CP, palpitations or SOB.  HE does not sleep well in general but not because of trouble breathing. No dizzy spells, near syncope or syncope.  He gets winded with heavier work, longer distances, but feels like he does prtty good with smaller chores. He continues to work.  We discussed a couple things today 1. AFib during his COVID hospitalization     Risk of stroke, suspicion that his AFib provoked by acute illness, though given h/o AFlutter, his age and CM, would not be surprised if he developes AFib.     He recalls being on Eliquis before     He denies any falls so far this year     He denies any ongoing excessive ETOH use and when he does drink, denies to the point of intoxication     Unclear why he wore the monitor only for a day, but had no AFib     No symptoms of Afib     Discussed perhaps loop implant for Afib surveillence as a possible option     For now, he is comfortable holding off on starting blood thinner with no symptoms of AFib, perhaps provoked by his illness   2. Worsened EF     Strategy with medicines to try and improve this again     Visits for titration   His edema is markedly improved  Past Medical History:  Diagnosis Date  . Atrial fibrillation, persistent ([place])   . Bladder cancer ([place])   . Chronic systolic CHF (congestive heart failure) ([place])   . History of blood transfusion   . Hypothyroidism   . Migraine    "years ago" (05/17/2016)  . Non-ischemic cardiomyopathy ([place])   . Thyroid disease     Past Surgical History:  Procedure Laterality Date  . CARDIAC CATHETERIZATION N/A 12/08/2015   Procedure: Left Heart Cath and Coronary Angiography;  Surgeon: Lorretta Harp, MD;  Location: [facility];  Service: Cardiovascular;  Laterality: N/A;  . CYSTOSCOPY WITH FULGERATION  2008   Archie Endo 11/23/2010   . ELECTROPHYSIOLOGIC STUDY N/A 05/17/2016   Procedure: A-Flutter Ablation;  Surgeon: Evans Lance, MD;  Location: [facility];  Service: Cardiovascular;  Laterality: N/A;  . TONSILLECTOMY    . TRANSURETHRAL RESECTION OF BLADDER  2008   Archie Endo 11/23/2010    Current Outpatient Medications  Medication Sig Dispense Refill  . albuterol (VENTOLIN HFA) 108 (90 Base) MCG/ACT inhaler Inhale 2 puffs into the lungs every 4 (four) hours as needed for wheezing or shortness of breath. 18 g 0  . ascorbic  acid (VITAMIN C) 500 MG tablet Take 1 tablet (500 mg total) by mouth daily.    Marland Kitchen aspirin EC 81 MG tablet Take 1 tablet (81 mg total) by mouth daily. 30 tablet 6  . carvedilol (COREG) 3.125 MG tablet Take 1 tablet (3.125 mg total) by mouth 2 (two) times daily with a meal. 540 tablet 1  . folic acid (FOLVITE) 1 MG tablet Take 1 tablet (1 mg total) by mouth daily. 30 tablet 11  . furosemide (LASIX) 40 MG tablet Take 1 tablet (40 mg total) by mouth daily. 30 tablet 11  . guaiFENesin-dextromethorphan (ROBITUSSIN DM) 100-10 MG/5ML syrup Take 10 mLs by mouth every 4 (four) hours as needed for cough. 118 mL 0  . levothyroxine (SYNTHROID, LEVOTHROID) 50 MCG tablet Take 50 mcg by mouth daily before breakfast.    . Multiple Vitamin (MULTIVITAMIN WITH MINERALS) TABS tablet Take 1 tablet by mouth daily.    . potassium chloride SA (KLOR-CON M20) 20 MEQ tablet Take 1 tablet (20 mEq total) by mouth daily. 30 tablet 6   No current facility-administered medications for this visit.    Allergies:   Asa [aspirin]   Social History:  The patient  reports that he has never smoked. He has never used smokeless tobacco. He reports current alcohol use of about 6.0 standard drinks of alcohol per week. He reports that he does not use drugs.   Family History:  The patient's family history includes CVA in his sister; Diabetes in his sister; Heart attack (age of onset: 25) in his father; Hypertension in his sister; Kidney  disease in his sister; Lung cancer in his sister.  ROS:  Please see the history of present illness.    All other systems are reviewed and otherwise negative.   PHYSICAL EXAM:  VS:  There were no vitals taken for this visit. BMI: There is no height or weight on file to calculate BMI. Well nourished, well developed, in no acute distress  HEENT: normocephalic, atraumatic  Neck: no JVD, carotid bruits or masses Cardiac:  RRR; no significant murmurs, no rubs, or gallops Lungs:  CTA b/l, no wheezing, rhonchi or rales  Abd: soft, nontender MS: no deformity or atrophy Ext: mild edema to just above the ankle, he has been at work today on his feet most of the day as well,  No erythema, wounds or weeping Skin: warm and dry, no rash Neuro:  No gross deficits appreciated Psych: euthymic mood, full affect   EKG:  12/18/19  SR 67bpm, 1st degree AVblock, PR 259ms, LAD    01/13/2020: 24hr Monitor 1. NSR with sinus brady and sinus tachycardia 2. occaisional PAC's and rare PVC's 3. NS atrial tachycardia.  4. Nocturnal bradycardia (minimum HR 45)    01/07/2020: TTE IMPRESSIONS  1. Left ventricular ejection fraction, by estimation, is 30 to 35%. The  left ventricle has moderately decreased function. The left ventricle  demonstrates global hypokinesis. Left ventricular diastolic parameters are  consistent with Grade II diastolic  dysfunction (pseudonormalization). Elevated left atrial pressure. The  average left ventricular global longitudinal strain is -10.5 %. The global  longitudinal strain is normal.  2. Right ventricular systolic function is mildly reduced. The right  ventricular size is moderately enlarged. There is normal pulmonary artery  systolic pressure. The estimated right ventricular systolic pressure is  98.1 mmHg.  3. Left atrial size was moderately dilated.  4. Right atrial size was mild to moderately dilated.  5. The mitral valve is grossly normal. Trivial  mitral valve   regurgitation. No evidence of mitral stenosis.  6. The aortic valve is tricuspid. Aortic valve regurgitation is not  visualized. Mild aortic valve stenosis. Aortic valve area, by VTI measures  1.95 cm. Aortic valve mean gradient measures 6.6 mmHg. Aortic valve Vmax  measures 1.85 m/s.  7. The inferior vena cava is normal in size with <50% respiratory  variability, suggesting right atrial pressure of 8 mmHg.   Comparison(s): Changes from prior study are noted. EF now 30-35% from  40-45%.    2D echo 07/02/2018 Study Conclusions - Left ventricle: There was moderate concentric hypertrophy. Systolic function was mildly to moderately reduced. The estimated ejection fraction was in the range of 40% to 45%. Diffuse hypokinesis. Although no diagnostic regional wall motion abnormality was identified, this possibility cannot be completely excluded on the basis of this study. Doppler parameters are consistent with abnormal left ventricular relaxation (grade 1 diastolic dysfunction). - Aortic valve: Trileaflet; moderately calcified leaflets, predominantly noncoronary cusp. There was mild regurgitation. - Mitral valve: Calcified annulus. There was mild regurgitation. Valve area by pressure half-time: 2.5 cm^2. - Left atrium: The atrium was mildly dilated. - Atrial septum: The septum bowed from left to right, consistent with increased left atrial pressure. There was a possible, very small patent foramen ovale.  Impressions: - No significant change from prior.    12/08/2015: [facility] IMPRESSION:Mr. Lovena Le has a left dominant system, clean coronary arteries and a dilated severely hypocontractile LV consistent with nonischemic cardiomyopathy. His groin was sealed with a "MYNX " device. He will continue medical therapy. He left the lab in stable condition   Recent Labs: 08/18/2019: B Natriuretic Peptide 303.7; TSH 3.330 08/19/2019: Magnesium 2.3 08/22/2019: ALT 29;  Hemoglobin 14.6; Platelets 183 12/31/2019: BUN 14; Creatinine, Ser 0.84; Potassium 4.2; Sodium 139  08/18/2019: Triglycerides 103   CrCl cannot be calculated (Patient's most recent lab result is older than the maximum 21 days allowed.).   Wt Readings from Last 3 Encounters:  12/31/19 169 lb (76.7 kg)  12/18/19 177 lb (80.3 kg)  08/19/19 151 lb 7.3 oz (68.7 kg)     Other studies reviewed: Additional studies/records reviewed today include: summarized above  ASSESSMENT AND PLAN:  1. AFlutter      Ablated 2017 >> subsequently off a/c 2. New Afib     noted during COVID hospitalization     CHA2DS2Vasc is 3, family declined a/c     Discussed further today as noted above       3. NICM 4. Chronic CHF (combined)     EF down again     On BB, ACE recently started  BMET today back on ACE No symptoms of bradycardia, he has nocturnal HR 45 bpm on his monitor Will tritrate his coreg to 6.25mg  BID    Disposition: Will have him see Dr. Lovena Le in 6 weeks, further discuss perhaps loop, anticoagulation, and further titration of medicines as able.     Current medicines are reviewed at length with the patient today.  The patient did not have any concerns regarding medicines.  Venetia Night, PA-C 01/27/2020 7:24 PM     [address] [place] [address] [phone] (office)  [phone] (fax)

## 2020-01-28 ENCOUNTER — Ambulatory Visit: Payer: Medicare Other | Admitting: Physician Assistant

## 2020-01-28 ENCOUNTER — Other Ambulatory Visit: Payer: Self-pay

## 2020-01-28 VITALS — BP 136/84 | HR 79 | Ht 67.25 in | Wt 167.0 lb

## 2020-01-28 DIAGNOSIS — I5022 Chronic systolic (congestive) heart failure: Secondary | ICD-10-CM | POA: Diagnosis not present

## 2020-01-28 DIAGNOSIS — Z79899 Other long term (current) drug therapy: Secondary | ICD-10-CM | POA: Diagnosis not present

## 2020-01-28 DIAGNOSIS — I480 Paroxysmal atrial fibrillation: Secondary | ICD-10-CM | POA: Diagnosis not present

## 2020-01-28 DIAGNOSIS — I428 Other cardiomyopathies: Secondary | ICD-10-CM

## 2020-01-28 MED ORDER — CARVEDILOL 6.25 MG PO TABS: 6.2500 mg | ORAL_TABLET | Freq: Two times a day (BID) | ORAL | 1 refills | Status: DC

## 2020-01-28 NOTE — Patient Instructions (Addendum)
Medication Instructions:   START TAKING CARVEDILOL 6.25 MG TWICE A DAY   *If you need a refill on your cardiac medications before your next appointment, please call your pharmacy*   Lab Work: BMET TODAY    If you have labs (blood work) drawn today and your tests are completely normal, you will receive your results only by: Marland Kitchen MyChart Message (if you have MyChart) OR . A paper copy in the mail If you have any lab test that is abnormal or we need to change your treatment, we will call you to review the results.   Testing/Procedures: NONE ORDERED  TODAY   Follow-Up: At [facility], you and your health needs are our priority.  As part of our continuing mission to provide you with exceptional heart care, we have created designated Provider Care Teams.  These Care Teams include your primary Cardiologist (physician) and Advanced Practice Providers (APPs -  Physician Assistants and Nurse Practitioners) who all work together to provide you with the care you need, when you need it.  We recommend signing up for the patient portal called "MyChart".  Sign up information is provided on this After Visit Summary.  MyChart is used to connect with patients for Virtual Visits (Telemedicine).  Patients are able to view lab/test results, encounter notes, upcoming appointments, etc.  Non-urgent messages can be sent to your provider as well.   To learn more about what you can do with MyChart, go to [email or web address].    Your next appointment:   6 week(s)  The format for your next appointment:   In Person  Provider:   You may see Dr. Lovena Le     Other Instructions

## 2020-01-29 LAB — BASIC METABOLIC PANEL
BUN/Creatinine Ratio: 14 (ref 10–24)
BUN: 13 mg/dL (ref 8–27)
CO2: 20 mmol/L (ref 20–29)
Calcium: 8.8 mg/dL (ref 8.6–10.2)
Chloride: 105 mmol/L (ref 96–106)
Creatinine, Ser: 0.9 mg/dL (ref 0.76–1.27)
GFR calc Af Amer: 94 mL/min/{1.73_m2} (ref >59)
GFR calc non Af Amer: 81 mL/min/{1.73_m2} (ref >59)
Glucose: 84 mg/dL (ref 65–99)
Potassium: 4.6 mmol/L (ref 3.5–5.2)
Sodium: 140 mmol/L (ref 134–144)

## 2020-02-25 DIAGNOSIS — B351 Tinea unguium: Secondary | ICD-10-CM | POA: Diagnosis not present

## 2020-02-25 DIAGNOSIS — L84 Corns and callosities: Secondary | ICD-10-CM | POA: Diagnosis not present

## 2020-03-14 ENCOUNTER — Ambulatory Visit: Payer: Medicare Other | Admitting: Internal Medicine

## 2020-03-14 ENCOUNTER — Other Ambulatory Visit: Payer: Self-pay

## 2020-03-14 VITALS — BP 136/74 | HR 82 | Ht 67.0 in | Wt 162.0 lb

## 2020-03-14 DIAGNOSIS — I5022 Chronic systolic (congestive) heart failure: Secondary | ICD-10-CM | POA: Diagnosis not present

## 2020-03-14 DIAGNOSIS — I429 Cardiomyopathy, unspecified: Secondary | ICD-10-CM

## 2020-03-14 DIAGNOSIS — I4892 Unspecified atrial flutter: Secondary | ICD-10-CM | POA: Diagnosis not present

## 2020-03-14 DIAGNOSIS — I480 Paroxysmal atrial fibrillation: Secondary | ICD-10-CM

## 2020-03-14 NOTE — Patient Instructions (Signed)

## 2020-03-14 NOTE — Progress Notes (Signed)
HPI [name] returns today for ongoing evaluation and management of paroxysmal atrial fibrillation, nonischemic cardiomyopathy, chronic systolic heart failure.  The patient has a history of orthostatic syncope which occurred last year and has not recurred.  When I saw him he was on fairly good medical therapy.  Repeat echocardiography has demonstrated a reduction in his left ventricular systolic function to 22%.  His medications have been uptitrated.  The patient has a history of paroxysmal atrial fibrillation.  He appears to have reverted back to sinus rhythm.  He is on guideline directed medical therapy for his heart failure.  He has a history of atrial flutter status post ablation.  He has refused systemic anticoagulation in the past. Allergies  Allergen Reactions  . Asa [Aspirin] Hives    Pt can tolerate 81 mg with no issues     Current Outpatient Medications  Medication Sig Dispense Refill  . albuterol (VENTOLIN HFA) 108 (90 Base) MCG/ACT inhaler Inhale 2 puffs into the lungs every 4 (four) hours as needed for wheezing or shortness of breath. 18 g 0  . ascorbic acid (VITAMIN C) 500 MG tablet Take 1 tablet (500 mg total) by mouth daily.    Marland Kitchen aspirin EC 81 MG tablet Take 1 tablet (81 mg total) by mouth daily. 30 tablet 6  . carvedilol (COREG) 6.25 MG tablet Take 1 tablet (6.25 mg total) by mouth 2 (two) times daily with a meal. 297 tablet 1  . folic acid (FOLVITE) 1 MG tablet Take 1 tablet (1 mg total) by mouth daily. 30 tablet 11  . furosemide (LASIX) 40 MG tablet Take 1 tablet (40 mg total) by mouth daily. 30 tablet 11  . guaiFENesin-dextromethorphan (ROBITUSSIN DM) 100-10 MG/5ML syrup Take 10 mLs by mouth every 4 (four) hours as needed for cough. 118 mL 0  . ibuprofen (ADVIL) 400 MG tablet Take 400 mg by mouth every 6 (six) hours.    Marland Kitchen levothyroxine (SYNTHROID, LEVOTHROID) 50 MCG tablet Take 50 mcg by mouth daily before breakfast.    . lisinopril (ZESTRIL) 2.5 MG tablet Take 2.5  mg by mouth daily.    . Multiple Vitamin (MULTIVITAMIN WITH MINERALS) TABS tablet Take 1 tablet by mouth daily.    . potassium chloride SA (KLOR-CON M20) 20 MEQ tablet Take 1 tablet (20 mEq total) by mouth daily. 30 tablet 6   No current facility-administered medications for this visit.     Past Medical History:  Diagnosis Date  . Acute diastolic congestive heart failure ([place])   . Acute systolic CHF (congestive heart failure) ([place])   . Atrial fibrillation, persistent ([place])   . Atrial flutter ([place]) 12/05/2015  . Bladder cancer ([place])   . Cardiomyopathy ([place])   . CHF (congestive heart failure) ([place]) 12/05/2015  . Chronic systolic CHF (congestive heart failure) ([place])   . Edema 12/05/2015  . Elevated brain natriuretic peptide (BNP) level   . Essential hypertension 06/24/2018  . ETOH abuse 12/05/2015  . History of blood transfusion   . Hypothyroidism   . Lumbar disc disease 12/05/2015  . Malnutrition of moderate degree 12/07/2015  . Migraine    "years ago" (05/17/2016)  . Non-ischemic cardiomyopathy ([place])   . Paroxysmal atrial fibrillation ([facility])   . Pneumonia due to COVID-19 virus 08/18/2019  . Protein calorie malnutrition ([place]) 12/05/2015  . SOB (shortness of breath) 12/05/2015  . Thrombocytopenia ([place]) 12/05/2015  . Thyroid disease   . Weight loss 12/05/2015    ROS:   All  systems reviewed and negative except as noted in the HPI.   Past Surgical History:  Procedure Laterality Date  . CARDIAC CATHETERIZATION N/A 12/08/2015   Procedure: Left Heart Cath and Coronary Angiography;  Surgeon: Lorretta Harp, MD;  Location: [facility];  Service: Cardiovascular;  Laterality: N/A;  . CYSTOSCOPY WITH FULGERATION  2008   Archie Endo 11/23/2010  . ELECTROPHYSIOLOGIC STUDY N/A 05/17/2016   Procedure: A-Flutter Ablation;  Surgeon: Evans Lance, MD;  Location: [facility];  Service: Cardiovascular;  Laterality: N/A;  . TONSILLECTOMY    . TRANSURETHRAL RESECTION OF BLADDER  2008    Archie Endo 11/23/2010     Family History  Problem Relation Age of Onset  . Heart attack Father 62  . Kidney disease Sister   . Hypertension Sister   . Lung cancer Sister   . CVA Sister   . Diabetes Sister      Social History   Socioeconomic History  . Marital status: Divorced    Spouse name: Not on file  . Number of children: Not on file  . Years of education: Not on file  . Highest education level: Not on file  Occupational History  . Not on file  Tobacco Use  . Smoking status: Never Smoker  . Smokeless tobacco: Never Used  Vaping Use  . Vaping Use: Never used  Substance and Sexual Activity  . Alcohol use: Yes    Alcohol/week: 6.0 standard drinks    Types: 6 Cans of beer per week    Comment: 05/17/2016 "stopped drinking ~ 09/2015"  . Drug use: No  . Sexual activity: Not Currently  Other Topics Concern  . Not on file  Social History Narrative  . Not on file   Social Determinants of Health   Financial Resource Strain:   . Difficulty of Paying Living Expenses: Not on file  Food Insecurity:   . Worried About Charity fundraiser in the Last Year: Not on file  . Ran Out of Food in the Last Year: Not on file  Transportation Needs:   . Lack of Transportation (Medical): Not on file  . Lack of Transportation (Non-Medical): Not on file  Physical Activity:   . Days of Exercise per Week: Not on file  . Minutes of Exercise per Session: Not on file  Stress:   . Feeling of Stress : Not on file  Social Connections:   . Frequency of Communication with Friends and Family: Not on file  . Frequency of Social Gatherings with Friends and Family: Not on file  . Attends Religious Services: Not on file  . Active Member of Clubs or Organizations: Not on file  . Attends Archivist Meetings: Not on file  . Marital Status: Not on file  Intimate Partner Violence:   . Fear of Current or Ex-Partner: Not on file  . Emotionally Abused: Not on file  . Physically Abused: Not on file   . Sexually Abused: Not on file     Ht 5\' 7"  (1.702 m)   Wt 162 lb (73.5 kg)   BMI 25.37 kg/m   Physical Exam:  Well appearing 79 yo man, NAD HEENT: Unremarkable Neck: 6 cm JVD, no thyromegally Lymphatics:  No adenopathy Back:  No CVA tenderness Lungs:  Clear, with no wheezes, rales, or rhonchi HEART:  Regular rate rhythm, no murmurs, no rubs, no clicks Abd:  soft, positive bowel sounds, no organomegally, no rebound, no guarding Ext:  2 plus pulses, no  edema, no cyanosis, no clubbing Skin:  No rashes no nodules Neuro:  CN II through XII intact, motor grossly intact  EKG -normal sinus rhythm   Assess/Plan: 1.  Chronic systolic heart failure -his symptoms are class II.  His EF is 35%.  He is on guideline directed medical therapy.  I do not think he is a particular good candidate for ICD insertion.  He will undergo watchful waiting. 2.  Paroxysmal atrial fibrillation -he is maintaining sinus rhythm.  He is not interested in systemic anticoagulation. 3.  Syncope -this was a remote incident that occurred when he stood up quickly.  It strongly suspicious of orthostasis.  He will undergo watchful waiting. 4.  Hypertension -his blood pressure is minimally elevated today.  I would recommend up titration of his beta-blocker if his blood pressures increase.  He is encouraged to keep a check of his blood pressure.  [name], MD

## 2020-05-05 DIAGNOSIS — B351 Tinea unguium: Secondary | ICD-10-CM | POA: Diagnosis not present

## 2020-05-05 DIAGNOSIS — L84 Corns and callosities: Secondary | ICD-10-CM | POA: Diagnosis not present

## 2020-09-05 DIAGNOSIS — L84 Corns and callosities: Secondary | ICD-10-CM | POA: Diagnosis not present

## 2020-09-05 DIAGNOSIS — B351 Tinea unguium: Secondary | ICD-10-CM | POA: Diagnosis not present

## 2020-09-13 ENCOUNTER — Ambulatory Visit
Admission: RE | Admit: 2020-09-13 | Discharge: 2020-09-13 | Disposition: A | Discharge status: Home / Self Care | Payer: Medicare Other | Source: Ambulatory Visit | Attending: Internal Medicine | Admitting: Internal Medicine

## 2020-09-13 ENCOUNTER — Other Ambulatory Visit: Payer: Self-pay | Admitting: Internal Medicine

## 2020-09-13 DIAGNOSIS — R0602 Shortness of breath: Secondary | ICD-10-CM

## 2020-09-13 DIAGNOSIS — I5022 Chronic systolic (congestive) heart failure: Secondary | ICD-10-CM | POA: Diagnosis not present

## 2020-09-13 DIAGNOSIS — E7800 Pure hypercholesterolemia, unspecified: Secondary | ICD-10-CM | POA: Diagnosis not present

## 2020-09-13 DIAGNOSIS — I700 Atherosclerosis of aorta: Secondary | ICD-10-CM | POA: Diagnosis not present

## 2020-09-13 DIAGNOSIS — D696 Thrombocytopenia, unspecified: Secondary | ICD-10-CM | POA: Diagnosis not present

## 2020-09-13 DIAGNOSIS — I428 Other cardiomyopathies: Secondary | ICD-10-CM | POA: Diagnosis not present

## 2020-09-13 DIAGNOSIS — R946 Abnormal results of thyroid function studies: Secondary | ICD-10-CM | POA: Diagnosis not present

## 2020-09-13 DIAGNOSIS — R8290 Unspecified abnormal findings in urine: Secondary | ICD-10-CM | POA: Diagnosis not present

## 2020-09-13 DIAGNOSIS — Z8616 Personal history of COVID-19: Secondary | ICD-10-CM | POA: Diagnosis not present

## 2020-09-13 DIAGNOSIS — I4892 Unspecified atrial flutter: Secondary | ICD-10-CM | POA: Diagnosis not present

## 2020-09-13 DIAGNOSIS — I10 Essential (primary) hypertension: Secondary | ICD-10-CM | POA: Diagnosis not present

## 2020-09-13 DIAGNOSIS — C679 Malignant neoplasm of bladder, unspecified: Secondary | ICD-10-CM | POA: Diagnosis not present

## 2020-09-13 DIAGNOSIS — E039 Hypothyroidism, unspecified: Secondary | ICD-10-CM | POA: Diagnosis not present

## 2020-09-22 DIAGNOSIS — I5022 Chronic systolic (congestive) heart failure: Secondary | ICD-10-CM | POA: Diagnosis not present

## 2020-09-22 DIAGNOSIS — I10 Essential (primary) hypertension: Secondary | ICD-10-CM | POA: Diagnosis not present

## 2020-09-27 DIAGNOSIS — I5022 Chronic systolic (congestive) heart failure: Secondary | ICD-10-CM | POA: Diagnosis not present

## 2020-12-05 DIAGNOSIS — I429 Cardiomyopathy, unspecified: Secondary | ICD-10-CM | POA: Diagnosis not present

## 2020-12-05 DIAGNOSIS — I700 Atherosclerosis of aorta: Secondary | ICD-10-CM | POA: Diagnosis not present

## 2020-12-05 DIAGNOSIS — D696 Thrombocytopenia, unspecified: Secondary | ICD-10-CM | POA: Diagnosis not present

## 2020-12-05 DIAGNOSIS — I10 Essential (primary) hypertension: Secondary | ICD-10-CM | POA: Diagnosis not present

## 2020-12-05 DIAGNOSIS — B351 Tinea unguium: Secondary | ICD-10-CM | POA: Diagnosis not present

## 2020-12-05 DIAGNOSIS — I4892 Unspecified atrial flutter: Secondary | ICD-10-CM | POA: Diagnosis not present

## 2020-12-05 DIAGNOSIS — L84 Corns and callosities: Secondary | ICD-10-CM | POA: Diagnosis not present

## 2020-12-05 DIAGNOSIS — Z23 Encounter for immunization: Secondary | ICD-10-CM | POA: Diagnosis not present

## 2020-12-05 DIAGNOSIS — E039 Hypothyroidism, unspecified: Secondary | ICD-10-CM | POA: Diagnosis not present

## 2020-12-05 DIAGNOSIS — Z1389 Encounter for screening for other disorder: Secondary | ICD-10-CM | POA: Diagnosis not present

## 2020-12-05 DIAGNOSIS — I5022 Chronic systolic (congestive) heart failure: Secondary | ICD-10-CM | POA: Diagnosis not present

## 2020-12-05 DIAGNOSIS — Z8551 Personal history of malignant neoplasm of bladder: Secondary | ICD-10-CM | POA: Diagnosis not present

## 2020-12-05 DIAGNOSIS — I70293 Other atherosclerosis of native arteries of extremities, bilateral legs: Secondary | ICD-10-CM | POA: Diagnosis not present

## 2021-02-03 DIAGNOSIS — Z8551 Personal history of malignant neoplasm of bladder: Secondary | ICD-10-CM | POA: Diagnosis not present

## 2021-02-08 DIAGNOSIS — Z03818 Encounter for observation for suspected exposure to other biological agents ruled out: Secondary | ICD-10-CM | POA: Diagnosis not present

## 2021-02-27 DIAGNOSIS — I70293 Other atherosclerosis of native arteries of extremities, bilateral legs: Secondary | ICD-10-CM | POA: Diagnosis not present

## 2021-02-27 DIAGNOSIS — L84 Corns and callosities: Secondary | ICD-10-CM | POA: Diagnosis not present

## 2021-02-27 DIAGNOSIS — L602 Onychogryphosis: Secondary | ICD-10-CM | POA: Diagnosis not present

## 2021-03-09 DIAGNOSIS — I10 Essential (primary) hypertension: Secondary | ICD-10-CM | POA: Diagnosis not present

## 2021-03-09 DIAGNOSIS — Z8551 Personal history of malignant neoplasm of bladder: Secondary | ICD-10-CM | POA: Diagnosis not present

## 2021-03-09 DIAGNOSIS — I700 Atherosclerosis of aorta: Secondary | ICD-10-CM | POA: Diagnosis not present

## 2021-03-09 DIAGNOSIS — I429 Cardiomyopathy, unspecified: Secondary | ICD-10-CM | POA: Diagnosis not present

## 2021-03-09 DIAGNOSIS — E039 Hypothyroidism, unspecified: Secondary | ICD-10-CM | POA: Diagnosis not present

## 2021-03-09 DIAGNOSIS — D696 Thrombocytopenia, unspecified: Secondary | ICD-10-CM | POA: Diagnosis not present

## 2021-03-09 DIAGNOSIS — I4892 Unspecified atrial flutter: Secondary | ICD-10-CM | POA: Diagnosis not present

## 2021-03-09 DIAGNOSIS — Z1389 Encounter for screening for other disorder: Secondary | ICD-10-CM | POA: Diagnosis not present

## 2021-03-09 DIAGNOSIS — I5022 Chronic systolic (congestive) heart failure: Secondary | ICD-10-CM | POA: Diagnosis not present

## 2021-03-09 DIAGNOSIS — Z0000 Encounter for general adult medical examination without abnormal findings: Secondary | ICD-10-CM | POA: Diagnosis not present

## 2021-03-15 ENCOUNTER — Ambulatory Visit: Payer: Medicare Other | Admitting: Internal Medicine

## 2021-03-23 DIAGNOSIS — C679 Malignant neoplasm of bladder, unspecified: Secondary | ICD-10-CM | POA: Diagnosis not present

## 2021-05-10 DIAGNOSIS — H40023 Open angle with borderline findings, high risk, bilateral: Secondary | ICD-10-CM | POA: Diagnosis not present

## 2021-05-10 DIAGNOSIS — Z961 Presence of intraocular lens: Secondary | ICD-10-CM | POA: Diagnosis not present

## 2021-05-10 DIAGNOSIS — H26493 Other secondary cataract, bilateral: Secondary | ICD-10-CM | POA: Diagnosis not present

## 2021-05-10 DIAGNOSIS — H524 Presbyopia: Secondary | ICD-10-CM | POA: Diagnosis not present

## 2021-05-29 DIAGNOSIS — I70203 Unspecified atherosclerosis of native arteries of extremities, bilateral legs: Secondary | ICD-10-CM | POA: Diagnosis not present

## 2021-06-26 DIAGNOSIS — Z23 Encounter for immunization: Secondary | ICD-10-CM | POA: Diagnosis not present

## 2021-06-26 DIAGNOSIS — D696 Thrombocytopenia, unspecified: Secondary | ICD-10-CM | POA: Diagnosis not present

## 2021-06-26 DIAGNOSIS — E039 Hypothyroidism, unspecified: Secondary | ICD-10-CM | POA: Diagnosis not present

## 2021-06-26 DIAGNOSIS — I5022 Chronic systolic (congestive) heart failure: Secondary | ICD-10-CM | POA: Diagnosis not present

## 2021-06-26 DIAGNOSIS — Z8551 Personal history of malignant neoplasm of bladder: Secondary | ICD-10-CM | POA: Diagnosis not present

## 2021-06-26 DIAGNOSIS — I700 Atherosclerosis of aorta: Secondary | ICD-10-CM | POA: Diagnosis not present

## 2021-06-26 DIAGNOSIS — I4892 Unspecified atrial flutter: Secondary | ICD-10-CM | POA: Diagnosis not present

## 2021-06-26 DIAGNOSIS — I10 Essential (primary) hypertension: Secondary | ICD-10-CM | POA: Diagnosis not present

## 2021-07-10 DIAGNOSIS — N390 Urinary tract infection, site not specified: Secondary | ICD-10-CM | POA: Diagnosis not present

## 2021-07-10 DIAGNOSIS — C679 Malignant neoplasm of bladder, unspecified: Secondary | ICD-10-CM | POA: Diagnosis not present

## 2021-07-12 DIAGNOSIS — I4892 Unspecified atrial flutter: Secondary | ICD-10-CM | POA: Diagnosis not present

## 2021-07-12 DIAGNOSIS — I428 Other cardiomyopathies: Secondary | ICD-10-CM | POA: Diagnosis not present

## 2021-07-12 DIAGNOSIS — I5022 Chronic systolic (congestive) heart failure: Secondary | ICD-10-CM | POA: Diagnosis not present

## 2021-07-12 DIAGNOSIS — R0602 Shortness of breath: Secondary | ICD-10-CM | POA: Diagnosis not present

## 2021-07-12 DIAGNOSIS — R051 Acute cough: Secondary | ICD-10-CM | POA: Diagnosis not present

## 2021-07-31 DIAGNOSIS — I70203 Unspecified atherosclerosis of native arteries of extremities, bilateral legs: Secondary | ICD-10-CM | POA: Diagnosis not present

## 2021-07-31 DIAGNOSIS — L84 Corns and callosities: Secondary | ICD-10-CM | POA: Diagnosis not present

## 2021-08-10 DIAGNOSIS — I5022 Chronic systolic (congestive) heart failure: Secondary | ICD-10-CM | POA: Diagnosis not present

## 2021-08-10 DIAGNOSIS — Z8551 Personal history of malignant neoplasm of bladder: Secondary | ICD-10-CM | POA: Diagnosis not present

## 2021-08-10 DIAGNOSIS — D696 Thrombocytopenia, unspecified: Secondary | ICD-10-CM | POA: Diagnosis not present

## 2021-08-10 DIAGNOSIS — I700 Atherosclerosis of aorta: Secondary | ICD-10-CM | POA: Diagnosis not present

## 2021-08-10 DIAGNOSIS — E039 Hypothyroidism, unspecified: Secondary | ICD-10-CM | POA: Diagnosis not present

## 2021-08-10 DIAGNOSIS — I429 Cardiomyopathy, unspecified: Secondary | ICD-10-CM | POA: Diagnosis not present

## 2021-08-10 DIAGNOSIS — I4892 Unspecified atrial flutter: Secondary | ICD-10-CM | POA: Diagnosis not present

## 2021-08-10 DIAGNOSIS — I10 Essential (primary) hypertension: Secondary | ICD-10-CM | POA: Diagnosis not present

## 2021-08-26 ENCOUNTER — Encounter (HOSPITAL_COMMUNITY): Payer: Self-pay

## 2021-08-26 ENCOUNTER — Observation Stay (HOSPITAL_COMMUNITY)
Admission: EM | Admit: 2021-08-26 | Discharge: 2021-08-27 | Disposition: A | Discharge status: Home / Self Care | Payer: Medicare Other | Attending: Family Medicine | Admitting: Family Medicine

## 2021-08-26 ENCOUNTER — Other Ambulatory Visit: Payer: Self-pay

## 2021-08-26 ENCOUNTER — Observation Stay (HOSPITAL_COMMUNITY): Payer: Medicare Other

## 2021-08-26 ENCOUNTER — Emergency Department (HOSPITAL_COMMUNITY): Payer: Medicare Other

## 2021-08-26 DIAGNOSIS — G319 Degenerative disease of nervous system, unspecified: Secondary | ICD-10-CM | POA: Diagnosis not present

## 2021-08-26 DIAGNOSIS — J3489 Other specified disorders of nose and nasal sinuses: Secondary | ICD-10-CM | POA: Diagnosis not present

## 2021-08-26 DIAGNOSIS — I110 Hypertensive heart disease with heart failure: Secondary | ICD-10-CM | POA: Insufficient documentation

## 2021-08-26 DIAGNOSIS — R41841 Cognitive communication deficit: Secondary | ICD-10-CM | POA: Diagnosis not present

## 2021-08-26 DIAGNOSIS — Z8616 Personal history of COVID-19: Secondary | ICD-10-CM | POA: Insufficient documentation

## 2021-08-26 DIAGNOSIS — Z743 Need for continuous supervision: Secondary | ICD-10-CM | POA: Diagnosis not present

## 2021-08-26 DIAGNOSIS — R208 Other disturbances of skin sensation: Secondary | ICD-10-CM | POA: Diagnosis present

## 2021-08-26 DIAGNOSIS — R29702 NIHSS score 2: Secondary | ICD-10-CM

## 2021-08-26 DIAGNOSIS — Z20822 Contact with and (suspected) exposure to covid-19: Secondary | ICD-10-CM | POA: Diagnosis not present

## 2021-08-26 DIAGNOSIS — I4819 Other persistent atrial fibrillation: Secondary | ICD-10-CM | POA: Insufficient documentation

## 2021-08-26 DIAGNOSIS — I5042 Chronic combined systolic (congestive) and diastolic (congestive) heart failure: Secondary | ICD-10-CM | POA: Diagnosis present

## 2021-08-26 DIAGNOSIS — I480 Paroxysmal atrial fibrillation: Secondary | ICD-10-CM | POA: Diagnosis present

## 2021-08-26 DIAGNOSIS — I10 Essential (primary) hypertension: Secondary | ICD-10-CM | POA: Diagnosis present

## 2021-08-26 DIAGNOSIS — I639 Cerebral infarction, unspecified: Secondary | ICD-10-CM | POA: Diagnosis not present

## 2021-08-26 DIAGNOSIS — Z8551 Personal history of malignant neoplasm of bladder: Secondary | ICD-10-CM | POA: Insufficient documentation

## 2021-08-26 DIAGNOSIS — E039 Hypothyroidism, unspecified: Secondary | ICD-10-CM | POA: Diagnosis not present

## 2021-08-26 DIAGNOSIS — I6523 Occlusion and stenosis of bilateral carotid arteries: Secondary | ICD-10-CM | POA: Diagnosis not present

## 2021-08-26 DIAGNOSIS — Z79899 Other long term (current) drug therapy: Secondary | ICD-10-CM | POA: Diagnosis not present

## 2021-08-26 DIAGNOSIS — I5043 Acute on chronic combined systolic (congestive) and diastolic (congestive) heart failure: Secondary | ICD-10-CM | POA: Insufficient documentation

## 2021-08-26 DIAGNOSIS — Z8673 Personal history of transient ischemic attack (TIA), and cerebral infarction without residual deficits: Secondary | ICD-10-CM | POA: Diagnosis present

## 2021-08-26 DIAGNOSIS — Z7982 Long term (current) use of aspirin: Secondary | ICD-10-CM | POA: Diagnosis not present

## 2021-08-26 DIAGNOSIS — R6889 Other general symptoms and signs: Secondary | ICD-10-CM | POA: Diagnosis not present

## 2021-08-26 DIAGNOSIS — I63411 Cerebral infarction due to embolism of right middle cerebral artery: Secondary | ICD-10-CM | POA: Diagnosis not present

## 2021-08-26 DIAGNOSIS — I5022 Chronic systolic (congestive) heart failure: Secondary | ICD-10-CM | POA: Diagnosis present

## 2021-08-26 DIAGNOSIS — F1010 Alcohol abuse, uncomplicated: Secondary | ICD-10-CM | POA: Diagnosis present

## 2021-08-26 LAB — DIFFERENTIAL
Abs Immature Granulocytes: 0.01 10*3/uL (ref 0.00–0.07)
Basophils Absolute: 0.1 10*3/uL (ref 0.0–0.1)
Basophils Relative: 1 %
Eosinophils Absolute: 0.1 10*3/uL (ref 0.0–0.5)
Eosinophils Relative: 2 %
Immature Granulocytes: 0 %
Lymphocytes Relative: 14 %
Lymphs Abs: 1 10*3/uL (ref 0.7–4.0)
Monocytes Absolute: 1 10*3/uL (ref 0.1–1.0)
Monocytes Relative: 15 %
Neutro Abs: 4.6 10*3/uL (ref 1.7–7.7)
Neutrophils Relative %: 68 %

## 2021-08-26 LAB — RESP PANEL BY RT-PCR (FLU A&B, COVID) ARPGX2
Influenza A by PCR: NEGATIVE
Influenza B by PCR: NEGATIVE
SARS Coronavirus 2 by RT PCR: NEGATIVE

## 2021-08-26 LAB — CBC
HCT: 42.4 % (ref 39.0–52.0)
Hemoglobin: 14 g/dL (ref 13.0–17.0)
MCH: 33.6 pg (ref 26.0–34.0)
MCHC: 33 g/dL (ref 30.0–36.0)
MCV: 101.7 fL — ABNORMAL HIGH (ref 80.0–100.0)
Platelets: UNDETERMINED 10*3/uL (ref 150–400)
RBC: 4.17 MIL/uL — ABNORMAL LOW (ref 4.22–5.81)
RDW: 14.8 % (ref 11.5–15.5)
WBC: 6.7 10*3/uL (ref 4.0–10.5)
nRBC: 0 % (ref 0.0–0.2)

## 2021-08-26 LAB — COMPREHENSIVE METABOLIC PANEL
ALT: 22 U/L (ref 0–44)
AST: 38 U/L (ref 15–41)
Albumin: 3.9 g/dL (ref 3.5–5.0)
Alkaline Phosphatase: 60 U/L (ref 38–126)
Anion gap: 15 (ref 5–15)
BUN: 15 mg/dL (ref 8–23)
CO2: 21 mmol/L — ABNORMAL LOW (ref 22–32)
Calcium: 8.9 mg/dL (ref 8.9–10.3)
Chloride: 102 mmol/L (ref 98–111)
Creatinine, Ser: 1.12 mg/dL (ref 0.61–1.24)
GFR, Estimated: >60 mL/min (ref >60)
Glucose, Bld: 91 mg/dL (ref 70–99)
Potassium: 4 mmol/L (ref 3.5–5.1)
Sodium: 138 mmol/L (ref 135–145)
Total Bilirubin: 1.7 mg/dL — ABNORMAL HIGH (ref 0.3–1.2)
Total Protein: 7.5 g/dL (ref 6.5–8.1)

## 2021-08-26 LAB — URINALYSIS, ROUTINE W REFLEX MICROSCOPIC
Bilirubin Urine: NEGATIVE
Glucose, UA: NEGATIVE mg/dL
Hgb urine dipstick: NEGATIVE
Ketones, ur: 40 mg/dL — AB
Leukocytes,Ua: NEGATIVE
Nitrite: NEGATIVE
Protein, ur: NEGATIVE mg/dL
Specific Gravity, Urine: 1.01 (ref 1.005–1.030)
pH: 5.5 (ref 5.0–8.0)

## 2021-08-26 LAB — RAPID URINE DRUG SCREEN, HOSP PERFORMED
Amphetamines: NOT DETECTED
Barbiturates: NOT DETECTED
Benzodiazepines: NOT DETECTED
Cocaine: NOT DETECTED
Opiates: NOT DETECTED
Tetrahydrocannabinol: NOT DETECTED

## 2021-08-26 LAB — CBG MONITORING, ED: Glucose-Capillary: 78 mg/dL (ref 70–99)

## 2021-08-26 LAB — PROTIME-INR
INR: 1.3 — ABNORMAL HIGH (ref 0.8–1.2)
Prothrombin Time: 15.8 seconds — ABNORMAL HIGH (ref 11.4–15.2)

## 2021-08-26 LAB — I-STAT CHEM 8, ED
BUN: 19 mg/dL (ref 8–23)
Calcium, Ion: 1.05 mmol/L — ABNORMAL LOW (ref 1.15–1.40)
Chloride: 104 mmol/L (ref 98–111)
Creatinine, Ser: 1 mg/dL (ref 0.61–1.24)
Glucose, Bld: 86 mg/dL (ref 70–99)
HCT: 44 % (ref 39.0–52.0)
Hemoglobin: 15 g/dL (ref 13.0–17.0)
Potassium: 4 mmol/L (ref 3.5–5.1)
Sodium: 138 mmol/L (ref 135–145)
TCO2: 24 mmol/L (ref 22–32)

## 2021-08-26 LAB — APTT: aPTT: 30 seconds (ref 24–36)

## 2021-08-26 LAB — LIPID PANEL
Cholesterol: 160 mg/dL (ref 0–200)
HDL: 75 mg/dL (ref >40)
LDL Cholesterol: 71 mg/dL (ref 0–99)
Total CHOL/HDL Ratio: 2.1 RATIO
Triglycerides: 71 mg/dL (ref <150)
VLDL: 14 mg/dL (ref 0–40)

## 2021-08-26 LAB — HEMOGLOBIN A1C
Hgb A1c MFr Bld: 4.8 % (ref 4.8–5.6)
Mean Plasma Glucose: 91.06 mg/dL

## 2021-08-26 LAB — TSH: TSH: 2.762 u[IU]/mL (ref 0.350–4.500)

## 2021-08-26 LAB — ETHANOL: Alcohol, Ethyl (B): <10 mg/dL (ref <10)

## 2021-08-26 MED ORDER — ENOXAPARIN SODIUM 40 MG/0.4ML IJ SOSY: 40.0000 mg | PREFILLED_SYRINGE | Freq: Every day | INTRAMUSCULAR | Status: DC

## 2021-08-26 MED ORDER — LEVOTHYROXINE SODIUM 50 MCG PO TABS
50.0000 ug | ORAL_TABLET | Freq: Every day | ORAL | Status: DC
  Administered 2021-08-27: 50 ug via ORAL
  Filled 2021-08-26: qty 1

## 2021-08-26 MED ORDER — METOPROLOL TARTRATE 5 MG/5ML IV SOLN: 5.0000 mg | Freq: Three times a day (TID) | INTRAVENOUS | Status: DC | PRN

## 2021-08-26 MED ORDER — APIXABAN 5 MG PO TABS
5.0000 mg | ORAL_TABLET | Freq: Two times a day (BID) | ORAL | Status: DC
  Administered 2021-08-26 – 2021-08-27 (×2): 5 mg via ORAL
  Filled 2021-08-26 (×2): qty 1

## 2021-08-26 MED ORDER — POTASSIUM CHLORIDE CRYS ER 20 MEQ PO TBCR
20.0000 meq | EXTENDED_RELEASE_TABLET | Freq: Every day | ORAL | Status: DC
  Administered 2021-08-26 – 2021-08-27 (×2): 20 meq via ORAL
  Filled 2021-08-26 (×2): qty 1

## 2021-08-26 MED ORDER — SENNOSIDES-DOCUSATE SODIUM 8.6-50 MG PO TABS: 1.0000 | ORAL_TABLET | Freq: Every evening | ORAL | Status: DC | PRN

## 2021-08-26 MED ORDER — ATORVASTATIN CALCIUM 10 MG PO TABS
20.0000 mg | ORAL_TABLET | Freq: Every day | ORAL | Status: DC
  Administered 2021-08-26 – 2021-08-27 (×2): 20 mg via ORAL
  Filled 2021-08-26 (×2): qty 2

## 2021-08-26 MED ORDER — ACETAMINOPHEN 325 MG PO TABS: 650.0000 mg | ORAL_TABLET | ORAL | Status: DC | PRN

## 2021-08-26 MED ORDER — THIAMINE HCL 100 MG PO TABS
100.0000 mg | ORAL_TABLET | Freq: Every day | ORAL | Status: DC
  Administered 2021-08-26 – 2021-08-27 (×2): 100 mg via ORAL
  Filled 2021-08-26 (×2): qty 1

## 2021-08-26 MED ORDER — ADULT MULTIVITAMIN W/MINERALS CH
1.0000 | ORAL_TABLET | Freq: Every day | ORAL | Status: DC
  Administered 2021-08-26 – 2021-08-27 (×2): 1 via ORAL
  Filled 2021-08-26 (×2): qty 1

## 2021-08-26 MED ORDER — IOHEXOL 350 MG/ML SOLN
75.0000 mL | Freq: Once | INTRAVENOUS | Status: AC | PRN
  Administered 2021-08-26: 75 mL via INTRAVENOUS

## 2021-08-26 MED ORDER — LORAZEPAM 1 MG PO TABS: 1.0000 mg | ORAL_TABLET | ORAL | Status: DC | PRN

## 2021-08-26 MED ORDER — ACETAMINOPHEN 650 MG RE SUPP: 650.0000 mg | RECTAL | Status: DC | PRN

## 2021-08-26 MED ORDER — CLOPIDOGREL BISULFATE 300 MG PO TABS: 300.0000 mg | ORAL_TABLET | Freq: Every day | ORAL | Status: DC

## 2021-08-26 MED ORDER — ALBUTEROL SULFATE (2.5 MG/3ML) 0.083% IN NEBU: 3.0000 mL | INHALATION_SOLUTION | RESPIRATORY_TRACT | Status: DC | PRN

## 2021-08-26 MED ORDER — ACETAMINOPHEN 160 MG/5ML PO SOLN: 650.0000 mg | ORAL | Status: DC | PRN

## 2021-08-26 MED ORDER — APIXABAN 5 MG PO TABS: 5.0000 mg | ORAL_TABLET | Freq: Two times a day (BID) | ORAL | Status: DC

## 2021-08-26 MED ORDER — STROKE: EARLY STAGES OF RECOVERY BOOK
Freq: Once | Status: AC
  Filled 2021-08-26: qty 1

## 2021-08-26 MED ORDER — LORAZEPAM 2 MG/ML IJ SOLN: 1.0000 mg | INTRAMUSCULAR | Status: DC | PRN

## 2021-08-26 MED ORDER — THIAMINE HCL 100 MG/ML IJ SOLN: 100.0000 mg | Freq: Every day | INTRAMUSCULAR | Status: DC

## 2021-08-26 MED ORDER — FOLIC ACID 1 MG PO TABS
1.0000 mg | ORAL_TABLET | Freq: Every day | ORAL | Status: DC
  Administered 2021-08-26 – 2021-08-27 (×2): 1 mg via ORAL
  Filled 2021-08-26 (×2): qty 1

## 2021-08-26 NOTE — ED Notes (Addendum)
Pt hooked back to monitor and in NAD and denies need of anything at this time Pt aware of need for urine specimen

## 2021-08-26 NOTE — Assessment & Plan Note (Addendum)
Allow for permissive HTN in first 24 -48 hours Holding home coreg, lisinopril, lasix  Prn lopressor if needed

## 2021-08-26 NOTE — ED Notes (Signed)
Pt back from CT

## 2021-08-26 NOTE — Evaluation (Signed)
Occupational Therapy Evaluation Patient Details Name: Lucas Fuller MRN: [id number] DOB: 02/25/1941 Today's Date: 08/26/2021   History of Present Illness 81 y.o. male right handed PMHx as reviewed below, PAF, CHF, HTN stroke with mild left sided weakness and incoordination.  MRI pending.  CT scan:Positive for acute to subacute posterior Right MCA territory infarct.  Outside of the window for TKN.   Clinical Impression   Patient admitted for the above diagnosis.  PTA he lives at his home, and has a roommate that stays with him.  The roommate is disabled, and cannot assist the patient.  The patient continues to drive, works part time as a Biomedical scientist at [employer].  He does not use an AD for mobility, completes all meal prep and is independent with ADL/IADL.  The patient subjectively reports most of his symptoms have resolved, and he is needing generalized supervision with ADL completion, and occasional Min Guard with mobility in the halls.  OT can follow int he acute setting, but no post acute OT is anticipated.         Recommendations for follow up therapy are one component of a multi-disciplinary discharge planning process, led by the attending physician.  Recommendations may be updated based on patient status, additional functional criteria and insurance authorization.   Follow Up Recommendations  No OT follow up    Assistance Recommended at Discharge PRN from family initially   Patient can return home with the following      Functional Status Assessment  Patient has had a recent decline in their functional status and demonstrates the ability to make significant improvements in function in a reasonable and predictable amount of time.  Equipment Recommendations  None recommended by OT    Recommendations for Other Services       Precautions / Restrictions Precautions Precautions: Fall Precaution Comments: Watch BP Restrictions Weight Bearing Restrictions: No      Mobility Bed  Mobility Overal bed mobility: Modified Independent                  Transfers Overall transfer level: Needs assistance Equipment used: None Transfers: Sit to/from Stand, Bed to chair/wheelchair/BSC Sit to Stand: Supervision     Step pivot transfers: Supervision, Min guard            Balance Overall balance assessment: Mild deficits observed, not formally tested                                         ADL either performed or assessed with clinical judgement   ADL       Grooming: Supervision/safety;Standing       Lower Body Bathing: Supervison/ safety;Sit to/from stand       Lower Body Dressing: Supervision/safety;Sit to/from stand   Toilet Transfer: Min guard;Ambulation;Regular Toilet                   Vision Patient Visual Report: No change from baseline       Perception Perception Perception: Within Functional Limits   Praxis Praxis Praxis: Intact    Pertinent Vitals/Pain Pain Assessment Pain Assessment: No/denies pain     Hand Dominance Right   Extremity/Trunk Assessment Upper Extremity Assessment Upper Extremity Assessment: Overall WFL for tasks assessed;LUE deficits/detail LUE Deficits / Details: Mild incoordination noted LUE Sensation: WNL   Lower Extremity Assessment Lower Extremity Assessment: Defer to PT evaluation   Cervical /  Trunk Assessment Cervical / Trunk Assessment: Kyphotic   Communication Communication Communication: No difficulties   Cognition Arousal/Alertness: Awake/alert Behavior During Therapy: WFL for tasks assessed/performed Overall Cognitive Status: Within Functional Limits for tasks assessed                                 General Comments: HOH     General Comments   BP 103/101 RN aware    Exercises     Shoulder Instructions      Home Living Family/patient expects to be discharged to:: Private residence Living Arrangements: Non-relatives/Friends Available  Help at Discharge: Family;Available PRN/intermittently Type of Home: House Home Access: Stairs to enter [employer] of Steps: 3 or 4 Entrance Stairs-Rails: Right Home Layout: One level     Bathroom Shower/Tub: Teacher, early years/pre: Handicapped height Bathroom Accessibility: Yes How Accessible: Accessible via walker Home Equipment: Shower seat   Additional Comments: Has a roommate that pays him rent.  Roommate is disabled at W/c level, and has PCA assist 7x/wk.      Prior Functioning/Environment Prior Level of Function : Independent/Modified Independent;Working/employed;Driving             Mobility Comments: No AD ADLs Comments: No assist with ADL/IADL.  Works part time as a Biomedical scientist at [employer] List: Impaired balance (sitting and/or standing);Impaired UE functional use      OT Treatment/Interventions: Self-care/ADL training;Therapeutic activities;Therapeutic exercise;Balance training;Patient/family education    OT Goals(Current goals can be found in the care plan section) Acute Rehab OT Goals Patient Stated Goal: Go back home when they let me. OT Goal Formulation: With patient Potential to Achieve Goals: Good  OT Frequency: Min 2X/week    Co-evaluation              AM-PAC OT "6 Clicks" Daily Activity     Outcome Measure Help from another person eating meals?: None Help from another person taking care of personal grooming?: A Little Help from another person toileting, which includes using toliet, bedpan, or urinal?: A Little Help from another person bathing (including washing, rinsing, drying)?: A Little Help from another person to put on and taking off regular upper body clothing?: A Little Help from another person to put on and taking off regular lower body clothing?: A Little 6 Click Score: 19   End of Session Equipment Utilized During Treatment: Gait belt  Activity Tolerance: Patient tolerated treatment  well Patient left: in bed;with call bell/phone within reach  OT Visit Diagnosis: Unsteadiness on feet (R26.81)                Time: 3532-9924 OT Time Calculation (min): 21 min Charges:  OT General Charges $OT Visit: 1 Visit OT Evaluation $OT Eval Moderate Complexity: 1 Mod  08/26/2021  RP, OTR/L  Acute Rehabilitation Services  Office:  [phone]   Metta Clines 08/26/2021, 3:44 PM

## 2021-08-26 NOTE — ED Notes (Signed)
Upon neuro exam, pt with mild facial droop to L side, neglect to left side when and pt has to be reminded to use his left side, some delayed processing w/ speech and difficulty repeating "you can't teach an old dog new tricks", L lower visual field deficit.

## 2021-08-26 NOTE — Consult Note (Signed)
Neurology Consult H&P  Lucas Fuller MR# [id number] 08/26/2021  CC: left face droop  History is obtained from: patient and chart.  HPI: Lucas Fuller is a 81 y.o. male right handed PMHx as reviewed below, PAF, CHF, HTN stroke with mild left sided symptoms. Yesterday 08/25/2021, he was driving home ~8416 and started to have a weird feeling in his left hand. He states it felt cold and tight. He went to bed and when he got up this morning he was unable to dress himself. He states he just couldn't make his body cooperate. He denies any headaches or vision changes.   He denies any slurred speech.    LKW: 1130 08/25/2021 tNK given: No OSW IR Thrombectomy No, not candidate Modified Rankin Scale: 0-Completely asymptomatic and back to baseline post- stroke NIHSS: 2 LOC Responsiveness 0 LOC Questions 0 LOC Commands 0 Horizontal eye movement 0 Visual field 0 Facial palsy 1 Motor arm - Right arm 0 Motor arm - Left arm 0 Motor leg - Right leg 0 Motor leg - Left leg 0 Limb ataxia 0 Sensory test 0 Language 0 Speech 0 Extinction and inattention 1  ROS: A complete ROS was performed and is negative except as noted in the HPI.   Past Medical History:  Diagnosis Date   Acute diastolic congestive heart failure (HCC)    Acute systolic CHF (congestive heart failure) (HCC)    Atrial fibrillation, persistent (HCC)    Atrial flutter (HCC) 12/05/2015   Bladder cancer (HCC)    Cardiomyopathy (HCC)    CHF (congestive heart failure) ([facility]) 12/27/3014   Chronic systolic CHF (congestive heart failure) (HCC)    Edema 12/05/2015   Elevated brain natriuretic peptide (BNP) level    Essential hypertension 06/24/2018   ETOH abuse 12/05/2015   History of blood transfusion    Hypothyroidism    Lumbar disc disease 12/05/2015   Malnutrition of moderate degree 12/07/2015   Migraine    "years ago" (05/17/2016)   Non-ischemic cardiomyopathy (HCC)    Paroxysmal atrial fibrillation (HCC)    Pneumonia due to COVID-19  virus 08/18/2019   Protein calorie malnutrition ([facility]) 12/05/2015   SOB (shortness of breath) 12/05/2015   Thrombocytopenia ([facility]) 12/05/2015   Thyroid disease    Weight loss 12/05/2015   Family History  Problem Relation Age of Onset   Heart attack Father 68   Kidney disease Sister    Hypertension Sister    Lung cancer Sister    CVA Sister    Diabetes Sister    Social History:  reports that he has never smoked. He has never used smokeless tobacco. He reports current alcohol use of about 6.0 standard drinks per week. He reports that he does not use drugs.  Prior to Admission medications   Medication Sig Start Date End Date Taking? Authorizing Provider  albuterol (VENTOLIN HFA) 108 (90 Base) MCG/ACT inhaler Inhale 2 puffs into the lungs every 4 (four) hours as needed for wheezing or shortness of breath. 08/22/19   Hosie Poisson, MD  ascorbic acid (VITAMIN C) 500 MG tablet Take 1 tablet (500 mg total) by mouth daily. 08/23/19   Hosie Poisson, MD  aspirin EC 81 MG tablet Take 1 tablet (81 mg total) by mouth daily. 06/20/16   Evans Lance, MD  carvedilol (COREG) 6.25 MG tablet Take 1 tablet (6.25 mg total) by mouth 2 (two) times daily with a meal. 01/28/20   Baldwin Jamaica, PA-C  folic acid (FOLVITE) 1 MG tablet Take  1 tablet (1 mg total) by mouth daily. 08/23/19   Hosie Poisson, MD  furosemide (LASIX) 40 MG tablet Take 1 tablet (40 mg total) by mouth daily. 12/18/19   Baldwin Jamaica, PA-C  guaiFENesin-dextromethorphan (ROBITUSSIN DM) 100-10 MG/5ML syrup Take 10 mLs by mouth every 4 (four) hours as needed for cough. 08/22/19   Hosie Poisson, MD  ibuprofen (ADVIL) 400 MG tablet Take 400 mg by mouth every 6 (six) hours. 01/19/20   [provider]  levothyroxine (SYNTHROID, LEVOTHROID) 50 MCG tablet Take 50 mcg by mouth daily before breakfast.    [provider]  lisinopril (ZESTRIL) 2.5 MG tablet Take 2.5 mg by mouth daily. 01/27/20   [provider]  Multiple Vitamin  (MULTIVITAMIN WITH MINERALS) TABS tablet Take 1 tablet by mouth daily. 08/23/19   Hosie Poisson, MD  potassium chloride SA (KLOR-CON M20) 20 MEQ tablet Take 1 tablet (20 mEq total) by mouth daily. 12/18/19   Baldwin Jamaica, PA-C   Exam: Current vital signs: BP (!) 163/88    Pulse 87    Temp 98.5 F (36.9 C) (Oral)    Resp (!) 21    Ht 5\' 7"  (1.702 m)    Wt 74.8 kg    SpO2 99%    BMI 25.84 kg/m   Physical Exam  Constitutional: Appears well-developed and well-nourished.  Psych: Affect appropriate to situation Eyes: No scleral injection HENT: No OP obstruction. Head: Normocephalic.  Cardiovascular: Normal rate and regular rhythm.  Respiratory: Effort normal, symmetric excursions bilaterally, no audible wheezing. GI: Soft.  No distension. There is no tenderness.  Skin: WDI  Neuro: Mental Status: Patient is awake, alert, oriented to person, place, month, year, and situation. Patient is able to give a clear and coherent history. Speech fluent, intact comprehension and repetition. No signs of aphasia. Visual Fields are full. Pupils are equal, round, and reactive to light. EOMI without ptosis or diplopia.  Facial sensation is symmetric to temperature Facial movement is left lower weakness.  Hearing is intact to voice. Uvula midline and palate elevates symmetrically. Shoulder shrug is symmetric. Tongue is midline without atrophy or fasciculations.  Tone is normal. Bulk is normal. 5/5 strength was present in all four extremities. Sensation is symmetric to light touch and temperature in the arms and legs. However, he has left sided sensory neglect. Deep Tendon Reflexes: 2+ and symmetric in the biceps and patellae. Toes are downgoing bilaterally. FNF and HKS are intact bilaterally. Gait - Deferred  I have reviewed labs in epic and the pertinent results are: Glu 91  I have reviewed the images obtained: NCT head showed acute - subacute posterior Right MCA territory infarct, ASPECTS  9.  CTA  head and neck thrombosed posterior Right MCA M2 branch at the bifurcation with a paucity of Right MCA M3/M4 branches. Right M1 patent without stenosis.  Assessment: Lucas Fuller is a 81 y.o. male PMHx as noted above, PAF (previously apixaban) with acute cardio embolic stroke. He is outside the window for thrombolytics and his symptoms are very mild and non-disabling.  He stopped apixaban in 2018 due to financial constraints and discussed with primary team and social work consult placed to see if he can have assistance with apixaban.  Plan: - MRI brain without contrast. - Recommend TTE. - Recommend labs: HbA1c, lipid panel, TSH. - Recommend Statin if LDL > 70 - Start apixaban. - Telemetry monitoring for arrhythmia. - Recommend bedside Swallow screen. - Recommend Stroke education. - Recommend PT/OT/SLP consult.  This patient is critically ill and at significant risk of neurological worsening, death and care requires constant monitoring of vital signs, hemodynamics,respiratory and cardiac monitoring, neurological assessment, discussion with family, other specialists and medical decision making of high complexity. I spent 71 minutes of neurocritical care time  in the care of  this patient. This was time spent independent of any time provided by nurse practitioner or PA.  Electronically signed by:  Lynnae Sandhoff, MD Page: [phone] 08/26/2021, 1:16 PM  If 7pm- 7am, please page neurology on call as listed in [facility].

## 2021-08-26 NOTE — ED Notes (Signed)
Updated pt's niece.

## 2021-08-26 NOTE — Assessment & Plan Note (Addendum)
Euvolemic today  Echo 12/2019: EF of 30-35% with moderately decreased LVF. Global hypokinesis. Grade II diastolic dysfunction. Moderately decreased RV function.  Repeat echo today Intake/output and weights Continue medical management with lisinopril, coreg, lasix after permissive HTN with acute CVA Per cardiology note in 2021 not a good candidate of ICD insertion.

## 2021-08-26 NOTE — Assessment & Plan Note (Signed)
Drink 2-3 mini liquors daily and sometimes a beer Never had a withdrawal from alcohol CIWA protocol, start MV/thiamine and folic acid

## 2021-08-26 NOTE — ED Notes (Signed)
Pt transported to CT ?

## 2021-08-26 NOTE — ED Notes (Addendum)
Called lab to check on status of lab work d/t it not showing in process. Per lab, they will find the tubes and run labs now. Explained that lab staff reported an hr ago that they would run labs and that they had the blood work. EDP notified of delay.

## 2021-08-26 NOTE — ED Provider Notes (Signed)
[facility] EMERGENCY DEPARTMENT Provider Note   CSN: [id number] Arrival date & time: 08/26/21  3491     History  Chief Complaint  Patient presents with   Stroke-like symptoms    ISRRAEL Fuller is a 81 y.o. male.  The history is provided by the patient and medical records. No language interpreter was used.  Neurologic Problem This is a new problem. The current episode started Fuller. The problem occurs constantly. The problem has not changed since onset.Pertinent negatives include no chest pain, no abdominal pain, no headaches and no shortness of breath. Nothing aggravates the symptoms. Nothing relieves the symptoms. He has tried nothing for the symptoms. The treatment provided no relief.      Home Medications Prior to Admission medications   Medication Sig Start Date End Date Taking? Authorizing Provider  albuterol (VENTOLIN HFA) 108 (90 Base) MCG/ACT inhaler Inhale 2 puffs into the lungs every 4 (four) hours as needed for wheezing or shortness of breath. 08/22/19   Hosie Poisson, MD  ascorbic acid (VITAMIN C) 500 MG tablet Take 1 tablet (500 mg total) by mouth daily. 08/23/19   Hosie Poisson, MD  aspirin EC 81 MG tablet Take 1 tablet (81 mg total) by mouth daily. 06/20/16   Evans Lance, MD  carvedilol (COREG) 6.25 MG tablet Take 1 tablet (6.25 mg total) by mouth 2 (two) times daily with a meal. 01/28/20   Baldwin Jamaica, PA-C  folic acid (FOLVITE) 1 MG tablet Take 1 tablet (1 mg total) by mouth daily. 08/23/19   Hosie Poisson, MD  furosemide (LASIX) 40 MG tablet Take 1 tablet (40 mg total) by mouth daily. 12/18/19   Baldwin Jamaica, PA-C  guaiFENesin-dextromethorphan (ROBITUSSIN DM) 100-10 MG/5ML syrup Take 10 mLs by mouth every 4 (four) hours as needed for cough. 08/22/19   Hosie Poisson, MD  ibuprofen (ADVIL) 400 MG tablet Take 400 mg by mouth every 6 (six) hours. 01/19/20   [provider]  levothyroxine (SYNTHROID, LEVOTHROID) 50 MCG tablet Take  50 mcg by mouth daily before breakfast.    [provider]  lisinopril (ZESTRIL) 2.5 MG tablet Take 2.5 mg by mouth daily. 01/27/20   [provider]  Multiple Vitamin (MULTIVITAMIN WITH MINERALS) TABS tablet Take 1 tablet by mouth daily. 08/23/19   Hosie Poisson, MD  potassium chloride SA (KLOR-CON M20) 20 MEQ tablet Take 1 tablet (20 mEq total) by mouth daily. 12/18/19   Baldwin Jamaica, PA-C      Allergies    Asa [aspirin]    Review of Systems   Review of Systems  Constitutional:  Negative for chills, diaphoresis, fatigue and fever.  HENT:  Negative for congestion.   Eyes:  Negative for visual disturbance.  Respiratory:  Negative for cough, chest tightness and shortness of breath.   Cardiovascular:  Negative for chest pain.  Gastrointestinal:  Negative for abdominal pain, constipation, diarrhea, nausea and vomiting.  Genitourinary:  Negative for dysuria and flank pain.  Musculoskeletal:  Negative for back pain, neck pain and neck stiffness.  Skin:  Negative for rash and wound.  Neurological:  Positive for numbness. Negative for dizziness, syncope, facial asymmetry, speech difficulty, weakness, light-headedness and headaches.  Psychiatric/Behavioral:  Negative for agitation and confusion.   All other systems reviewed and are negative.  Physical Exam Updated Vital Signs BP (!) 135/91    Pulse 83    Temp 98.5 F (36.9 C) (Oral)    Resp 19    Ht 5'  7" (1.702 m)    Wt 74.8 kg    SpO2 98%    BMI 25.84 kg/m  Physical Exam Vitals and nursing note reviewed.  Constitutional:      General: He is not in acute distress.    Appearance: He is well-developed. He is not ill-appearing, toxic-appearing or diaphoretic.  HENT:     Head: Normocephalic and atraumatic.     Nose: No congestion or rhinorrhea.     Mouth/Throat:     Mouth: Mucous membranes are moist.     Pharynx: No oropharyngeal exudate or posterior oropharyngeal erythema.  Eyes:     General: No visual field  deficit.    Extraocular Movements: Extraocular movements intact.     Conjunctiva/sclera: Conjunctivae normal.     Pupils: Pupils are equal, round, and reactive to light.  Cardiovascular:     Rate and Rhythm: Normal rate and regular rhythm.     Heart sounds: No murmur heard. Pulmonary:     Effort: Pulmonary effort is normal. No respiratory distress.     Breath sounds: Normal breath sounds. No wheezing, rhonchi or rales.  Chest:     Chest wall: No tenderness.  Abdominal:     Palpations: Abdomen is soft.     Tenderness: There is no abdominal tenderness. There is no right CVA tenderness, left CVA tenderness, guarding or rebound.  Musculoskeletal:        General: No swelling or tenderness.     Cervical back: Neck supple. No tenderness.  Skin:    General: Skin is warm and dry.     Capillary Refill: Capillary refill takes less than 2 seconds.     Findings: No erythema.  Neurological:     Mental Status: He is alert and oriented to person, place, and time.     Cranial Nerves: No cranial nerve deficit or facial asymmetry.     Sensory: Sensory deficit present.     Motor: No weakness, tremor, abnormal muscle tone or pronator drift.     Coordination: Finger-Nose-Finger Test normal.     Comments: Neglect of left side both on sensation intermittently.  Numbness of the left face, left arm compared to right.  Psychiatric:        Mood and Affect: Mood normal.    ED Results / Procedures / Treatments   Labs (all labs ordered are listed, but only abnormal results are displayed) Labs Reviewed  I-STAT CHEM 8, ED - Abnormal; Notable for the following components:      Result Value   Calcium, Ion 1.05 (*)    All other components within normal limits  RESP PANEL BY RT-PCR (FLU A&B, COVID) ARPGX2  ETHANOL  PROTIME-INR  APTT  CBC  DIFFERENTIAL  COMPREHENSIVE METABOLIC PANEL  RAPID URINE DRUG SCREEN, HOSP PERFORMED  URINALYSIS, ROUTINE W REFLEX MICROSCOPIC  TSH  CBG MONITORING, ED     EKG EKG Interpretation  Date/Time:  Saturday August 26 2021 09:58:57 EST Ventricular Rate:  84 PR Interval:  249 QRS Duration: 119 QT Interval:  415 QTC Calculation: 491 R Axis:   -70 Text Interpretation: Sinus rhythm Prolonged PR interval Left anterior fascicular block Consider anterior infarct Nonspecific T abnormalities, lateral leads ST elevation, consider inferior injury When compared to prior, similar appearance. No STEMI Confirmed by Antony Blackbird [phone]) on 08/26/2021 10:07:24 AM  Radiology CT Angio Head W or Wo Contrast  Result Date: 08/26/2021 CLINICAL DATA:  81 year old male with left side neurologic deficit, left side numbness and neglect, left visual field cut.  EXAM: CT HEAD WITHOUT CONTRAST CT ANGIOGRAPHY HEAD AND NECK TECHNIQUE: Multidetector noncontrast head CT, CTA imaging of the head and neck was performed using the standard protocol during bolus administration of intravenous contrast. Multiplanar CT image reconstructions and MIPs were obtained to evaluate the vascular anatomy. Carotid stenosis measurements (when applicable) are obtained utilizing NASCET criteria, using the distal internal carotid diameter as the denominator. RADIATION DOSE REDUCTION: This exam was performed according to the departmental dose-optimization program which includes automated exposure control, adjustment of the mA and/or kV according to patient size and/or use of iterative reconstruction technique. CONTRAST:  80mL OMNIPAQUE IOHEXOL 350 MG/ML SOLN COMPARISON:  Brain MRI 06/22/2009. Head CT 06/22/2009. CTA chest 08/18/2019. FINDINGS: CT HEAD Brain: Small right cerebellar infarct on series 4, image 9 is likely chronic given small size and conspicuity. Probable similar chronic left cerebellar infarct image 7. Patchy bilateral white matter hypodensity. Cytotoxic edema in the posterior right MCA territory most apparent on series 4, image 21. Right basal ganglia and insula appear spared. No other areas of  cytotoxic edema identified. ASPECTS 9 (abnormal M5 or M6). No acute intracranial hemorrhage identified. No intracranial mass effect or ventriculomegaly. Calvarium and skull base: No acute osseous abnormality identified. 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 Tympanic cavities and mastoids are well aerated. Orbits: Postoperative changes to both globes since 2010. No acute orbit or scalp soft tissue finding. CTA NECK Skeleton: Absent maxillary dentition. 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 No acute osseous abnormality identified. Upper chest: Negative. Other neck: Superficial dystrophic calcification 8 mm lateral to the right submandibular gland, significance doubtful. No acute neck soft tissue finding identified. Aortic arch: Calcified aortic atherosclerosis. 3 vessel arch configuration. Right carotid system: Minimal brachiocephalic and right CCA origin plaque without stenosis. Mild plaque at the right ICA origin and medial bulb without stenosis. Left carotid system: Mildly tortuous left CCA. Minimal plaque, no stenosis. Vertebral arteries: Minimal plaque in the proximal right subclavian artery. Non dominant, highly diminutive appearing proximal right vertebral artery with atherosclerosis at its origin resulting in mild to moderate stenosis, and some obscuration due to regional paravertebral venous contrast reflux. But the right V2 segment is patent. The vessel remains diminutive but patent to the skull base. Soft and calcified plaque in the proximal left subclavian artery without significant stenosis. Normal left vertebral artery origin. Dominant appearing left vertebral artery with tortuous V1 segment. Patent left vertebral to the skull base without stenosis. CTA HEAD Posterior circulation: Dominant left V4 segment. Normal left PICA origin. Diminutive right V4 but continues to the vertebrobasilar junction. Right AICA  appears dominant. No significant distal vertebral or basilar stenosis. Patent SCA origins. Fetal type bilateral PCA origins. Bilateral PCA branches are within normal limits. Anterior circulation: Both ICA siphons are patent. Moderate left siphon calcified plaque but only mild associated stenosis. Normal left ophthalmic and posterior communicating artery origin. Right siphon similar moderate calcified plaque with no significant stenosis. Normal right ophthalmic and posterior communicating artery origins. Patent carotid termini, MCA and ACA origins. Normal anterior communicating artery. Bilateral ACA branches are within normal limits. Left MCA M1 segment and bifurcation are patent without stenosis. Left MCA branches are within normal limits. Right MCA M1 segment is patent. Right MCA bifurcation is suspected on series 9, image 106 with a thrombosed posterior M2 branch (series 9, image 106 there. However, other proximal right MCA branches appear patent and within normal limits. But there is a paucity of posterior right MCA  M3/M4 branches. Venous sinuses: Early contrast timing, not well evaluated. Anatomic variants: Dominant left vertebral artery and fetal type PCA origins. Review of the MIP images confirms the above findings IMPRESSION: 1. 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 No hemorrhage or mass effect. 2. Evidence of thrombosed posterior Right MCA M2 branch (series 9, image 106) at the bifurcation with a paucity of Right MCA M3/M4 branches. Right M1 patent without stenosis. 3. But mild for age underlying atherosclerosis in the head and neck. Some origin stenosis of the non dominant and diminutive right vertebral artery, but no other significant atherosclerotic stenosis. 4. Small chronic appearing bilateral cerebellar infarcts. 5. Aortic Atherosclerosis (ICD10-I70.0). Study discussed by telephone with Dr. Marda Stalker on 08/26/2021 at 11:42 . Electronically Signed   By:  Genevie Ann M.D.   On: 08/26/2021 11:49   CT HEAD WO CONTRAST  Result Date: 08/26/2021 CLINICAL DATA:  81 year old male with left side neurologic deficit, left side numbness and neglect, left visual field cut. EXAM: CT HEAD WITHOUT CONTRAST CT ANGIOGRAPHY HEAD AND NECK TECHNIQUE: Multidetector noncontrast head CT, CTA imaging of the head and neck was performed using the standard protocol during bolus administration of intravenous contrast. Multiplanar CT image reconstructions and MIPs were obtained to evaluate the vascular anatomy. Carotid stenosis measurements (when applicable) are obtained utilizing NASCET criteria, using the distal internal carotid diameter as the denominator. RADIATION DOSE REDUCTION: This exam was performed according to the departmental dose-optimization program which includes automated exposure control, adjustment of the mA and/or kV according to patient size and/or use of iterative reconstruction technique. CONTRAST:  67mL OMNIPAQUE IOHEXOL 350 MG/ML SOLN COMPARISON:  Brain MRI 06/22/2009. Head CT 06/22/2009. CTA chest 08/18/2019. FINDINGS: CT HEAD Brain: Small right cerebellar infarct on series 4, image 9 is likely chronic given small size and conspicuity. Probable similar chronic left cerebellar infarct image 7. Patchy bilateral white matter hypodensity. Cytotoxic edema in the posterior right MCA territory most apparent on series 4, image 21. Right basal ganglia and insula appear spared. No other areas of cytotoxic edema identified. ASPECTS 9 (abnormal M5 or M6). No acute intracranial hemorrhage identified. No intracranial mass effect or ventriculomegaly. Calvarium and skull base: No acute osseous abnormality identified. 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 Tympanic cavities and mastoids are well aerated. Orbits: Postoperative changes to both globes since 2010. No acute orbit or scalp soft tissue finding. CTA NECK Skeleton: Absent maxillary  dentition. 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 No acute osseous abnormality identified. Upper chest: Negative. Other neck: Superficial dystrophic calcification 8 mm lateral to the right submandibular gland, significance doubtful. No acute neck soft tissue finding identified. Aortic arch: Calcified aortic atherosclerosis. 3 vessel arch configuration. Right carotid system: Minimal brachiocephalic and right CCA origin plaque without stenosis. Mild plaque at the right ICA origin and medial bulb without stenosis. Left carotid system: Mildly tortuous left CCA. Minimal plaque, no stenosis. Vertebral arteries: Minimal plaque in the proximal right subclavian artery. Non dominant, highly diminutive appearing proximal right vertebral artery with atherosclerosis at its origin resulting in mild to moderate stenosis, and some obscuration due to regional paravertebral venous contrast reflux. But the right V2 segment is patent. The vessel remains diminutive but patent to the skull base. Soft and calcified plaque in the proximal left subclavian artery without significant stenosis. Normal left vertebral artery origin. Dominant appearing left vertebral artery with tortuous V1 segment. Patent left vertebral to the skull base  without stenosis. CTA HEAD Posterior circulation: Dominant left V4 segment. Normal left PICA origin. Diminutive right V4 but continues to the vertebrobasilar junction. Right AICA appears dominant. No significant distal vertebral or basilar stenosis. Patent SCA origins. Fetal type bilateral PCA origins. Bilateral PCA branches are within normal limits. Anterior circulation: Both ICA siphons are patent. Moderate left siphon calcified plaque but only mild associated stenosis. Normal left ophthalmic and posterior communicating artery origin. Right siphon similar moderate calcified plaque with no significant stenosis. Normal right ophthalmic and posterior communicating artery  origins. Patent carotid termini, MCA and ACA origins. Normal anterior communicating artery. Bilateral ACA branches are within normal limits. Left MCA M1 segment and bifurcation are patent without stenosis. Left MCA branches are within normal limits. Right MCA M1 segment is patent. Right MCA bifurcation is suspected on series 9, image 106 with a thrombosed posterior M2 branch (series 9, image 106 there. However, other proximal right MCA branches appear patent and within normal limits. But there is a paucity of posterior right MCA M3/M4 branches. Venous sinuses: Early contrast timing, not well evaluated. Anatomic variants: Dominant left vertebral artery and fetal type PCA origins. Review of the MIP images confirms the above findings IMPRESSION: 1. 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 No hemorrhage or mass effect. 2. Evidence of thrombosed posterior Right MCA M2 branch (series 9, image 106) at the bifurcation with a paucity of Right MCA M3/M4 branches. Right M1 patent without stenosis. 3. But mild for age underlying atherosclerosis in the head and neck. Some origin stenosis of the non dominant and diminutive right vertebral artery, but no other significant atherosclerotic stenosis. 4. Small chronic appearing bilateral cerebellar infarcts. 5. Aortic Atherosclerosis (ICD10-I70.0). Study discussed by telephone with Dr. Marda Stalker on 08/26/2021 at 11:42 . Electronically Signed   By: Genevie Ann M.D.   On: 08/26/2021 11:49   CT Angio Neck W and/or Wo Contrast  Result Date: 08/26/2021 CLINICAL DATA:  81 year old male with left side neurologic deficit, left side numbness and neglect, left visual field cut. EXAM: CT HEAD WITHOUT CONTRAST CT ANGIOGRAPHY HEAD AND NECK TECHNIQUE: Multidetector noncontrast head CT, CTA imaging of the head and neck was performed using the standard protocol during bolus administration of intravenous contrast. Multiplanar CT image reconstructions and  MIPs were obtained to evaluate the vascular anatomy. Carotid stenosis measurements (when applicable) are obtained utilizing NASCET criteria, using the distal internal carotid diameter as the denominator. RADIATION DOSE REDUCTION: This exam was performed according to the departmental dose-optimization program which includes automated exposure control, adjustment of the mA and/or kV according to patient size and/or use of iterative reconstruction technique. CONTRAST:  66mL OMNIPAQUE IOHEXOL 350 MG/ML SOLN COMPARISON:  Brain MRI 06/22/2009. Head CT 06/22/2009. CTA chest 08/18/2019. FINDINGS: CT HEAD Brain: Small right cerebellar infarct on series 4, image 9 is likely chronic given small size and conspicuity. Probable similar chronic left cerebellar infarct image 7. Patchy bilateral white matter hypodensity. Cytotoxic edema in the posterior right MCA territory most apparent on series 4, image 21. Right basal ganglia and insula appear spared. No other areas of cytotoxic edema identified. ASPECTS 9 (abnormal M5 or M6). No acute intracranial hemorrhage identified. No intracranial mass effect or ventriculomegaly. Calvarium and skull base: No acute osseous abnormality identified. 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 Tympanic cavities and mastoids are well aerated. Orbits: Postoperative changes to both globes since 2010. No acute orbit or scalp soft tissue finding. CTA NECK  Skeleton: Absent maxillary dentition. 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 No acute osseous abnormality identified. Upper chest: Negative. Other neck: Superficial dystrophic calcification 8 mm lateral to the right submandibular gland, significance doubtful. No acute neck soft tissue finding identified. Aortic arch: Calcified aortic atherosclerosis. 3 vessel arch configuration. Right carotid system: Minimal brachiocephalic and right CCA origin plaque without  stenosis. Mild plaque at the right ICA origin and medial bulb without stenosis. Left carotid system: Mildly tortuous left CCA. Minimal plaque, no stenosis. Vertebral arteries: Minimal plaque in the proximal right subclavian artery. Non dominant, highly diminutive appearing proximal right vertebral artery with atherosclerosis at its origin resulting in mild to moderate stenosis, and some obscuration due to regional paravertebral venous contrast reflux. But the right V2 segment is patent. The vessel remains diminutive but patent to the skull base. Soft and calcified plaque in the proximal left subclavian artery without significant stenosis. Normal left vertebral artery origin. Dominant appearing left vertebral artery with tortuous V1 segment. Patent left vertebral to the skull base without stenosis. CTA HEAD Posterior circulation: Dominant left V4 segment. Normal left PICA origin. Diminutive right V4 but continues to the vertebrobasilar junction. Right AICA appears dominant. No significant distal vertebral or basilar stenosis. Patent SCA origins. Fetal type bilateral PCA origins. Bilateral PCA branches are within normal limits. Anterior circulation: Both ICA siphons are patent. Moderate left siphon calcified plaque but only mild associated stenosis. Normal left ophthalmic and posterior communicating artery origin. Right siphon similar moderate calcified plaque with no significant stenosis. Normal right ophthalmic and posterior communicating artery origins. Patent carotid termini, MCA and ACA origins. Normal anterior communicating artery. Bilateral ACA branches are within normal limits. Left MCA M1 segment and bifurcation are patent without stenosis. Left MCA branches are within normal limits. Right MCA M1 segment is patent. Right MCA bifurcation is suspected on series 9, image 106 with a thrombosed posterior M2 branch (series 9, image 106 there. However, other proximal right MCA branches appear patent and within  normal limits. But there is a paucity of posterior right MCA M3/M4 branches. Venous sinuses: Early contrast timing, not well evaluated. Anatomic variants: Dominant left vertebral artery and fetal type PCA origins. Review of the MIP images confirms the above findings IMPRESSION: 1. 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 No hemorrhage or mass effect. 2. Evidence of thrombosed posterior Right MCA M2 branch (series 9, image 106) at the bifurcation with a paucity of Right MCA M3/M4 branches. Right M1 patent without stenosis. 3. But mild for age underlying atherosclerosis in the head and neck. Some origin stenosis of the non dominant and diminutive right vertebral artery, but no other significant atherosclerotic stenosis. 4. Small chronic appearing bilateral cerebellar infarcts. 5. Aortic Atherosclerosis (ICD10-I70.0). Study discussed by telephone with Dr. Marda Stalker on 08/26/2021 at 11:42 . Electronically Signed   By: Genevie Ann M.D.   On: 08/26/2021 11:49    Procedures Procedures    Medications Ordered in ED Medications  iohexol (OMNIPAQUE) 350 MG/ML injection 75 mL (75 mLs Intravenous Contrast Given 08/26/21 1127)    ED Course/ Medical Decision Making/ A&P                           Medical Decision Making Amount and/or Complexity of Data Reviewed Labs: ordered. Radiology: ordered.  Risk Prescription drug management.   RAMIE ERMAN is a 81 y.o. male with a past medical history significant for previous  a flutter, hypothyroidism, CHF, hypertension, previous bladder cancer, and migraines who presents with neurologic deficits.  According to patient, since Fuller around 11:30 AM, he noticed some difference in sensation from the right side to left side of his body.  He thought he might improve and then waited till today for evaluation.  He denies any pain, including no headache, neck pain, or chest pain.  Denies nausea, vomiting, speech difficulties, or  vision difficulties.  He just feels like something is wrong.  On exam, lungs clear chest nontender.  Abdomen nontender.  Patient had some decree sensation left face, left arm compared to right.  His left leg did not reportedly feel numb to him.  No weakness appreciated on my exam that he had normal finger-nose-finger testing.  Symmetric smile.  Pupils are symmetric and reactive with normal extract movements.  Patient did have some subtle neglect from a sensory standpoint in the left face, left arm, and left leg.  He also had some visual field neglect on the left side compared to the right but was able to name correct number of fingers with visual field evaluation.  Exam otherwise unremarkable initially.  Spoke to neurology quickly who agrees that patient may have a subtle stroke.  They agree with a work-up including a CTA of the head and neck as well as a noncontrasted CT of the head.  They did not feel he needed to be a code stroke and does not need perfusion study at this time.  We discussed that given the timeline and his subtle symptoms the did not feel need to activate as a code stroke/LVO/band stroke.  Will reassess patient and await neurology recommendations after imaging and work-up of been completed.  11:58 AM Radiology called to report that the CTA does show evidence of a right sided M2 occlusion likely causing the stroke symptoms.  I touch base with neurology and spoke to Dr. Theda Sers again who recommends MRI and admit to medicine for further stroke management.         Final Clinical Impression(s) / ED Diagnoses Final diagnoses:  Cerebrovascular accident (CVA), unspecified mechanism ([place])      Clinical Impression: 1. Cerebrovascular accident (CVA), unspecified mechanism ([place])     Disposition: Admit  This note was prepared with assistance of Dragon voice recognition software. Occasional wrong-word or sound-a-like substitutions may have occurred due to the inherent limitations  of voice recognition software.       Eliyas Suddreth, Gwenyth Allegra, MD 08/26/21 1229

## 2021-08-26 NOTE — Assessment & Plan Note (Addendum)
Hx of atrial flutter s/p ablation Continue coreg  Follows with cardiology and last seen in 2021 and documented not interested in systemic anticoagulation. Stopped in 01/2017 due to cost.  Currently in NSR, but with new CVA, would encourage DOAC. Had long discussion with him. Starting eliquis today and will have Social work consult has been placed for eliquis assistance/cost CHA2Ds2-Vasc score of: 6 Continue telemetry

## 2021-08-26 NOTE — ED Notes (Addendum)
Patient transported to MRI. Will go to floor from there.

## 2021-08-26 NOTE — Progress Notes (Signed)
Patient ID: Lucas Fuller, male   DOB: Jan 03, 1941, 81 y.o.   MRN: [id number] Patient arrived to floor via stretcher. VSS, no c/o pain. Still reporting numbness on left side but able to ambulate with walker to the bathroom. Will continue to monitor.   Haydee Salter, RN

## 2021-08-26 NOTE — Progress Notes (Signed)
PT Cancellation Note  Patient Details Name: Lucas Fuller MRN: [id number] DOB: May 30, 1941   Cancelled Treatment:    Reason Eval/Treat Not Completed: Patient at procedure or test/unavailable. Pt OTF at MRI and then will transfer to floor from there, per chart. Will plan to follow-up tomorrow.  Moishe Spice, PT, DPT Acute Rehabilitation Services  Pager: [phone] Office: [place] 08/26/2021, 4:24 PM

## 2021-08-26 NOTE — ED Notes (Signed)
Pt attempting to provide urine specimen at this time.  

## 2021-08-26 NOTE — Assessment & Plan Note (Addendum)
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  -place in observation on telemetry for stroke work-up -Neurochecks per protocol -Neurology consulted -MRI brain without contrast  -echocardiogram  -a1c/lipid panel for AM -had long conversation about eliquis with his PAF and Cha2Ds2-Vasc of 6. I discussed starting this today. Will start eliquis and have SW consult to see cost. If he can not afford will need to change him to plavix/asa -Permissive hypertension first 24 hours <190 -N.p.o. until bedside swallow screen -PT/ OT/ SLP consult

## 2021-08-26 NOTE — Assessment & Plan Note (Signed)
No TSH since 2021, repeat level pending Continue home dose of synthroid for now

## 2021-08-26 NOTE — ED Triage Notes (Signed)
Pt arrived to ED via EMS from home. Pt reports onset of stroke-like s/s yesterday 1330 while driving home. Pt reports a weird sensation to R arm when he touches it w/ his L hand. EMS noted pt having difficulty w/ fine motor skills when trying to put his pants on, buckle his belt, and put is wallet in his pants. EMS reports pt was dropping things. No hx of CVA or blood thinners. VSS w/ EMS. CBG 133. 20g R AC. Pt A&Ox4.

## 2021-08-26 NOTE — ED Notes (Signed)
Neurologist at bedside. 

## 2021-08-26 NOTE — ED Notes (Signed)
Pt reporting LKW 1130 yesterday

## 2021-08-26 NOTE — H&P (Signed)
History and Physical    Patient: Lucas Fuller PHX:[id number] DOB: Jul 09, 1941 DOA: 08/26/2021 DOS: the patient was seen and examined on 08/26/2021 PCP: Merrilee Seashore, MD  Patient coming from: Home - lives with a roommate that he takes care of.    Chief Complaint: stroke like symptoms   HPI: Lucas Fuller is a 81 y.o. male with medical history significant of PAF, systolic/diastolic CHF, HTN, hx of bladder cancer, hypothyroidism who presented with stroke like symptoms. He was driving home yesterday around 11:30am and he started to have a weird feeling in his left hand. He states it felt cold and tight. He went to bed and when he got up this morning he was unable to dress himself. He states he just couldn't make his body cooperate. He denies any headaches or vision changes. He denies any slurred speech or facial drooping and no weakness.   He denies any fever/chills, chest pain or palpitations, shortness of breath or cough, stomach pain, N/V/D, urinary symptoms or leg swelling from baseline.   He does not smoke. No family history of strokes. He does drink alcohol daily. Liquor (2 mini bottles/day). No drug use.   He is Right handed. Drives, takes care of his roommate.     ER Course:  vitals: afebrile, bp: 135/91, HR: 82, RR: 19, oxygen: 99%RA Pertinent labs: pending, covid negative CTA/CTH: acute posterior right MCA territory infarct. Evidenced of thrombosed posterior right MCA M2 branch at the bifurcation with a paucity of right MCA M3/M4 branches.  In ED: neurology called, [facility] asked to admit.     Review of Systems: As mentioned in the history of present illness. All other systems reviewed and are negative. Past Medical History:  Diagnosis Date   Acute diastolic congestive heart failure (HCC)    Acute systolic CHF (congestive heart failure) (HCC)    Atrial fibrillation, persistent (HCC)    Atrial flutter ([place]) 12/05/2015   Bladder cancer (HCC)    Cardiomyopathy (HCC)    CHF  (congestive heart failure) ([place]) 0/16/5537   Chronic systolic CHF (congestive heart failure) (HCC)    Edema 12/05/2015   Elevated brain natriuretic peptide (BNP) level    Essential hypertension 06/24/2018   ETOH abuse 12/05/2015   History of blood transfusion    Hypothyroidism    Lumbar disc disease 12/05/2015   Malnutrition of moderate degree 12/07/2015   Migraine    "years ago" (05/17/2016)   Non-ischemic cardiomyopathy (HCC)    Paroxysmal atrial fibrillation ([place])    Pneumonia due to COVID-19 virus 08/18/2019   Protein calorie malnutrition ([place]) 12/05/2015   SOB (shortness of breath) 12/05/2015   Thrombocytopenia ([place]) 12/05/2015   Thyroid disease    Weight loss 12/05/2015   Past Surgical History:  Procedure Laterality Date   CARDIAC CATHETERIZATION N/A 12/08/2015   Procedure: Left Heart Cath and Coronary Angiography;  Surgeon: Lorretta Harp, MD;  Location: [facility];  Service: Cardiovascular;  Laterality: N/A;   CYSTOSCOPY WITH FULGERATION  2008   Archie Endo 11/23/2010   ELECTROPHYSIOLOGIC STUDY N/A 05/17/2016   Procedure: A-Flutter Ablation;  Surgeon: Evans Lance, MD;  Location: [facility];  Service: Cardiovascular;  Laterality: N/A;   TONSILLECTOMY     TRANSURETHRAL RESECTION OF BLADDER  2008   Archie Endo 11/23/2010   Social History:  reports that he has never smoked. He has never used smokeless tobacco. He reports current alcohol use of about 6.0 standard drinks per week. He reports that he does not use  drugs.  Allergies  Allergen Reactions   Asa [Aspirin] Hives    Pt can tolerate 81 mg with no issues    Family History  Problem Relation Age of Onset   Heart attack Father 37   Kidney disease Sister    Hypertension Sister    Lung cancer Sister    CVA Sister    Diabetes Sister     Prior to Admission medications   Medication Sig Start Date End Date Taking? Authorizing Provider  albuterol (VENTOLIN HFA) 108 (90 Base) MCG/ACT inhaler Inhale 2 puffs into the lungs  every 4 (four) hours as needed for wheezing or shortness of breath. 08/22/19   Hosie Poisson, MD  ascorbic acid (VITAMIN C) 500 MG tablet Take 1 tablet (500 mg total) by mouth daily. 08/23/19   Hosie Poisson, MD  aspirin EC 81 MG tablet Take 1 tablet (81 mg total) by mouth daily. 06/20/16   Evans Lance, MD  carvedilol (COREG) 6.25 MG tablet Take 1 tablet (6.25 mg total) by mouth 2 (two) times daily with a meal. 01/28/20   Baldwin Jamaica, PA-C  folic acid (FOLVITE) 1 MG tablet Take 1 tablet (1 mg total) by mouth daily. 08/23/19   Hosie Poisson, MD  furosemide (LASIX) 40 MG tablet Take 1 tablet (40 mg total) by mouth daily. 12/18/19   Baldwin Jamaica, PA-C  guaiFENesin-dextromethorphan (ROBITUSSIN DM) 100-10 MG/5ML syrup Take 10 mLs by mouth every 4 (four) hours as needed for cough. 08/22/19   Hosie Poisson, MD  ibuprofen (ADVIL) 400 MG tablet Take 400 mg by mouth every 6 (six) hours. 01/19/20   [provider]  levothyroxine (SYNTHROID, LEVOTHROID) 50 MCG tablet Take 50 mcg by mouth daily before breakfast.    [provider]  lisinopril (ZESTRIL) 2.5 MG tablet Take 2.5 mg by mouth daily. 01/27/20   [provider]  Multiple Vitamin (MULTIVITAMIN WITH MINERALS) TABS tablet Take 1 tablet by mouth daily. 08/23/19   Hosie Poisson, MD  potassium chloride SA (KLOR-CON M20) 20 MEQ tablet Take 1 tablet (20 mEq total) by mouth daily. 12/18/19   Baldwin Jamaica, PA-C    Physical Exam: Vitals:   08/26/21 1048 08/26/21 1100 08/26/21 1215 08/26/21 1230  BP: (!) 150/78 (!) 145/94 (!) 154/101 (!) 163/88  Pulse: 85  87   Resp: 18 19 19  (!) 21  Temp:      TempSrc:      SpO2: 98%  99%   Weight:      Height:       General:  Appears calm and comfortable and is in NAD Eyes:  PERRL, EOMI, normal lids, iris ENT:  grossly normal hearing, lips & tongue, mmm; appropriate dentition Neck:  no LAD, masses or thyromegaly; no carotid bruits, no JVD  Cardiovascular:  RRR, no m/r/g. No LE  edema.  Respiratory:   CTA bilaterally with no wheezes/rales/rhonchi.  Normal respiratory effort. Abdomen:  soft, NT, ND, NABS Back:   normal alignment, no CVAT Skin:  no rash or induration seen on limited exam Musculoskeletal:  grossly normal tone BUE/BLE,. Minimally decreased hand grip on left compared to the right good ROM, no bony abnormality Lower extremity:  No LE edema.  Limited foot exam with no ulcerations.  2+ distal pulses. Psychiatric:  grossly normal mood and affect, speech fluent and appropriate, AOx3 Neurologic:  CN 2-12 grossly intact, moves all extremities in coordinated fashion, sensation intact. FTN intact bilaterally, HTK intact bilaterally. DTR 2+, gait deferred. Peripheral vision exam  poor.    Radiological Exams on Admission: Independently reviewed - see discussion in A/P where applicable  CT Angio Head W or Wo Contrast  Result Date: 08/26/2021 CLINICAL DATA:  81 year old male with left side neurologic deficit, left side numbness and neglect, left visual field cut. EXAM: CT HEAD WITHOUT CONTRAST CT ANGIOGRAPHY HEAD AND NECK TECHNIQUE: Multidetector noncontrast head CT, CTA imaging of the head and neck was performed using the standard protocol during bolus administration of intravenous contrast. Multiplanar CT image reconstructions and MIPs were obtained to evaluate the vascular anatomy. Carotid stenosis measurements (when applicable) are obtained utilizing NASCET criteria, using the distal internal carotid diameter as the denominator. RADIATION DOSE REDUCTION: This exam was performed according to the departmental dose-optimization program which includes automated exposure control, adjustment of the mA and/or kV according to patient size and/or use of iterative reconstruction technique. CONTRAST:  17mL OMNIPAQUE IOHEXOL 350 MG/ML SOLN COMPARISON:  Brain MRI 06/22/2009. Head CT 06/22/2009. CTA chest 08/18/2019. FINDINGS: CT HEAD Brain: Small right cerebellar infarct on series 4,  image 9 is likely chronic given small size and conspicuity. Probable similar chronic left cerebellar infarct image 7. Patchy bilateral white matter hypodensity. Cytotoxic edema in the posterior right MCA territory most apparent on series 4, image 21. Right basal ganglia and insula appear spared. No other areas of cytotoxic edema identified. ASPECTS 9 (abnormal M5 or M6). No acute intracranial hemorrhage identified. No intracranial mass effect or ventriculomegaly. Calvarium and skull base: No acute osseous abnormality identified. 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 Tympanic cavities and mastoids are well aerated. Orbits: Postoperative changes to both globes since 2010. No acute orbit or scalp soft tissue finding. CTA NECK Skeleton: Absent maxillary dentition. 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 No acute osseous abnormality identified. Upper chest: Negative. Other neck: Superficial dystrophic calcification 8 mm lateral to the right submandibular gland, significance doubtful. No acute neck soft tissue finding identified. Aortic arch: Calcified aortic atherosclerosis. 3 vessel arch configuration. Right carotid system: Minimal brachiocephalic and right CCA origin plaque without stenosis. Mild plaque at the right ICA origin and medial bulb without stenosis. Left carotid system: Mildly tortuous left CCA. Minimal plaque, no stenosis. Vertebral arteries: Minimal plaque in the proximal right subclavian artery. Non dominant, highly diminutive appearing proximal right vertebral artery with atherosclerosis at its origin resulting in mild to moderate stenosis, and some obscuration due to regional paravertebral venous contrast reflux. But the right V2 segment is patent. The vessel remains diminutive but patent to the skull base. Soft and calcified plaque in the proximal left subclavian artery without significant stenosis. Normal left  vertebral artery origin. Dominant appearing left vertebral artery with tortuous V1 segment. Patent left vertebral to the skull base without stenosis. CTA HEAD Posterior circulation: Dominant left V4 segment. Normal left PICA origin. Diminutive right V4 but continues to the vertebrobasilar junction. Right AICA appears dominant. No significant distal vertebral or basilar stenosis. Patent SCA origins. Fetal type bilateral PCA origins. Bilateral PCA branches are within normal limits. Anterior circulation: Both ICA siphons are patent. Moderate left siphon calcified plaque but only mild associated stenosis. Normal left ophthalmic and posterior communicating artery origin. Right siphon similar moderate calcified plaque with no significant stenosis. Normal right ophthalmic and posterior communicating artery origins. Patent carotid termini, MCA and ACA origins. Normal anterior communicating artery. Bilateral ACA branches are within normal limits. Left MCA M1 segment and bifurcation are patent without stenosis. Left MCA branches are within normal limits.  Right MCA M1 segment is patent. Right MCA bifurcation is suspected on series 9, image 106 with a thrombosed posterior M2 branch (series 9, image 106 there. However, other proximal right MCA branches appear patent and within normal limits. But there is a paucity of posterior right MCA M3/M4 branches. Venous sinuses: Early contrast timing, not well evaluated. Anatomic variants: Dominant left vertebral artery and fetal type PCA origins. Review of the MIP images confirms the above findings IMPRESSION: 1. 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 No hemorrhage or mass effect. 2. Evidence of thrombosed posterior Right MCA M2 branch (series 9, image 106) at the bifurcation with a paucity of Right MCA M3/M4 branches. Right M1 patent without stenosis. 3. But mild for age underlying atherosclerosis in the head and neck. Some origin stenosis of the  non dominant and diminutive right vertebral artery, but no other significant atherosclerotic stenosis. 4. Small chronic appearing bilateral cerebellar infarcts. 5. Aortic Atherosclerosis (ICD10-I70.0). Study discussed by telephone with Dr. Marda Stalker on 08/26/2021 at 11:42 . Electronically Signed   By: Genevie Ann M.D.   On: 08/26/2021 11:49   CT HEAD WO CONTRAST  Result Date: 08/26/2021 CLINICAL DATA:  81 year old male with left side neurologic deficit, left side numbness and neglect, left visual field cut. EXAM: CT HEAD WITHOUT CONTRAST CT ANGIOGRAPHY HEAD AND NECK TECHNIQUE: Multidetector noncontrast head CT, CTA imaging of the head and neck was performed using the standard protocol during bolus administration of intravenous contrast. Multiplanar CT image reconstructions and MIPs were obtained to evaluate the vascular anatomy. Carotid stenosis measurements (when applicable) are obtained utilizing NASCET criteria, using the distal internal carotid diameter as the denominator. RADIATION DOSE REDUCTION: This exam was performed according to the departmental dose-optimization program which includes automated exposure control, adjustment of the mA and/or kV according to patient size and/or use of iterative reconstruction technique. CONTRAST:  42mL OMNIPAQUE IOHEXOL 350 MG/ML SOLN COMPARISON:  Brain MRI 06/22/2009. Head CT 06/22/2009. CTA chest 08/18/2019. FINDINGS: CT HEAD Brain: Small right cerebellar infarct on series 4, image 9 is likely chronic given small size and conspicuity. Probable similar chronic left cerebellar infarct image 7. Patchy bilateral white matter hypodensity. Cytotoxic edema in the posterior right MCA territory most apparent on series 4, image 21. Right basal ganglia and insula appear spared. No other areas of cytotoxic edema identified. ASPECTS 9 (abnormal M5 or M6). No acute intracranial hemorrhage identified. No intracranial mass effect or ventriculomegaly. Calvarium and skull base: No  acute osseous abnormality identified. 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 Tympanic cavities and mastoids are well aerated. Orbits: Postoperative changes to both globes since 2010. No acute orbit or scalp soft tissue finding. CTA NECK Skeleton: Absent maxillary dentition. 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 No acute osseous abnormality identified. Upper chest: Negative. Other neck: Superficial dystrophic calcification 8 mm lateral to the right submandibular gland, significance doubtful. No acute neck soft tissue finding identified. Aortic arch: Calcified aortic atherosclerosis. 3 vessel arch configuration. Right carotid system: Minimal brachiocephalic and right CCA origin plaque without stenosis. Mild plaque at the right ICA origin and medial bulb without stenosis. Left carotid system: Mildly tortuous left CCA. Minimal plaque, no stenosis. Vertebral arteries: Minimal plaque in the proximal right subclavian artery. Non dominant, highly diminutive appearing proximal right vertebral artery with atherosclerosis at its origin resulting in mild to moderate stenosis, and some obscuration due to regional paravertebral venous contrast reflux. But the right  V2 segment is patent. The vessel remains diminutive but patent to the skull base. Soft and calcified plaque in the proximal left subclavian artery without significant stenosis. Normal left vertebral artery origin. Dominant appearing left vertebral artery with tortuous V1 segment. Patent left vertebral to the skull base without stenosis. CTA HEAD Posterior circulation: Dominant left V4 segment. Normal left PICA origin. Diminutive right V4 but continues to the vertebrobasilar junction. Right AICA appears dominant. No significant distal vertebral or basilar stenosis. Patent SCA origins. Fetal type bilateral PCA origins. Bilateral PCA branches are within normal limits. Anterior  circulation: Both ICA siphons are patent. Moderate left siphon calcified plaque but only mild associated stenosis. Normal left ophthalmic and posterior communicating artery origin. Right siphon similar moderate calcified plaque with no significant stenosis. Normal right ophthalmic and posterior communicating artery origins. Patent carotid termini, MCA and ACA origins. Normal anterior communicating artery. Bilateral ACA branches are within normal limits. Left MCA M1 segment and bifurcation are patent without stenosis. Left MCA branches are within normal limits. Right MCA M1 segment is patent. Right MCA bifurcation is suspected on series 9, image 106 with a thrombosed posterior M2 branch (series 9, image 106 there. However, other proximal right MCA branches appear patent and within normal limits. But there is a paucity of posterior right MCA M3/M4 branches. Venous sinuses: Early contrast timing, not well evaluated. Anatomic variants: Dominant left vertebral artery and fetal type PCA origins. Review of the MIP images confirms the above findings IMPRESSION: 1. 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 No hemorrhage or mass effect. 2. Evidence of thrombosed posterior Right MCA M2 branch (series 9, image 106) at the bifurcation with a paucity of Right MCA M3/M4 branches. Right M1 patent without stenosis. 3. But mild for age underlying atherosclerosis in the head and neck. Some origin stenosis of the non dominant and diminutive right vertebral artery, but no other significant atherosclerotic stenosis. 4. Small chronic appearing bilateral cerebellar infarcts. 5. Aortic Atherosclerosis (ICD10-I70.0). Study discussed by telephone with Dr. Marda Stalker on 08/26/2021 at 11:42 . Electronically Signed   By: Genevie Ann M.D.   On: 08/26/2021 11:49   CT Angio Neck W and/or Wo Contrast  Result Date: 08/26/2021 CLINICAL DATA:  81 year old male with left side neurologic deficit, left side  numbness and neglect, left visual field cut. EXAM: CT HEAD WITHOUT CONTRAST CT ANGIOGRAPHY HEAD AND NECK TECHNIQUE: Multidetector noncontrast head CT, CTA imaging of the head and neck was performed using the standard protocol during bolus administration of intravenous contrast. Multiplanar CT image reconstructions and MIPs were obtained to evaluate the vascular anatomy. Carotid stenosis measurements (when applicable) are obtained utilizing NASCET criteria, using the distal internal carotid diameter as the denominator. RADIATION DOSE REDUCTION: This exam was performed according to the departmental dose-optimization program which includes automated exposure control, adjustment of the mA and/or kV according to patient size and/or use of iterative reconstruction technique. CONTRAST:  80mL OMNIPAQUE IOHEXOL 350 MG/ML SOLN COMPARISON:  Brain MRI 06/22/2009. Head CT 06/22/2009. CTA chest 08/18/2019. FINDINGS: CT HEAD Brain: Small right cerebellar infarct on series 4, image 9 is likely chronic given small size and conspicuity. Probable similar chronic left cerebellar infarct image 7. Patchy bilateral white matter hypodensity. Cytotoxic edema in the posterior right MCA territory most apparent on series 4, image 21. Right basal ganglia and insula appear spared. No other areas of cytotoxic edema identified. ASPECTS 9 (abnormal M5 or M6). No acute intracranial hemorrhage identified. No intracranial mass effect  or ventriculomegaly. Calvarium and skull base: No acute osseous abnormality identified. 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 Tympanic cavities and mastoids are well aerated. Orbits: Postoperative changes to both globes since 2010. No acute orbit or scalp soft tissue finding. CTA NECK Skeleton: Absent maxillary dentition. 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 No acute osseous abnormality identified. Upper chest: Negative. Other  neck: Superficial dystrophic calcification 8 mm lateral to the right submandibular gland, significance doubtful. No acute neck soft tissue finding identified. Aortic arch: Calcified aortic atherosclerosis. 3 vessel arch configuration. Right carotid system: Minimal brachiocephalic and right CCA origin plaque without stenosis. Mild plaque at the right ICA origin and medial bulb without stenosis. Left carotid system: Mildly tortuous left CCA. Minimal plaque, no stenosis. Vertebral arteries: Minimal plaque in the proximal right subclavian artery. Non dominant, highly diminutive appearing proximal right vertebral artery with atherosclerosis at its origin resulting in mild to moderate stenosis, and some obscuration due to regional paravertebral venous contrast reflux. But the right V2 segment is patent. The vessel remains diminutive but patent to the skull base. Soft and calcified plaque in the proximal left subclavian artery without significant stenosis. Normal left vertebral artery origin. Dominant appearing left vertebral artery with tortuous V1 segment. Patent left vertebral to the skull base without stenosis. CTA HEAD Posterior circulation: Dominant left V4 segment. Normal left PICA origin. Diminutive right V4 but continues to the vertebrobasilar junction. Right AICA appears dominant. No significant distal vertebral or basilar stenosis. Patent SCA origins. Fetal type bilateral PCA origins. Bilateral PCA branches are within normal limits. Anterior circulation: Both ICA siphons are patent. Moderate left siphon calcified plaque but only mild associated stenosis. Normal left ophthalmic and posterior communicating artery origin. Right siphon similar moderate calcified plaque with no significant stenosis. Normal right ophthalmic and posterior communicating artery origins. Patent carotid termini, MCA and ACA origins. Normal anterior communicating artery. Bilateral ACA branches are within normal limits. Left MCA M1 segment  and bifurcation are patent without stenosis. Left MCA branches are within normal limits. Right MCA M1 segment is patent. Right MCA bifurcation is suspected on series 9, image 106 with a thrombosed posterior M2 branch (series 9, image 106 there. However, other proximal right MCA branches appear patent and within normal limits. But there is a paucity of posterior right MCA M3/M4 branches. Venous sinuses: Early contrast timing, not well evaluated. Anatomic variants: Dominant left vertebral artery and fetal type PCA origins. Review of the MIP images confirms the above findings IMPRESSION: 1. 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 No hemorrhage or mass effect. 2. Evidence of thrombosed posterior Right MCA M2 branch (series 9, image 106) at the bifurcation with a paucity of Right MCA M3/M4 branches. Right M1 patent without stenosis. 3. But mild for age underlying atherosclerosis in the head and neck. Some origin stenosis of the non dominant and diminutive right vertebral artery, but no other significant atherosclerotic stenosis. 4. Small chronic appearing bilateral cerebellar infarcts. 5. Aortic Atherosclerosis (ICD10-I70.0). Study discussed by telephone with Dr. Marda Stalker on 08/26/2021 at 11:42 . Electronically Signed   By: Genevie Ann M.D.   On: 08/26/2021 11:49    EKG: Independently reviewed.  NSR with rate 84, LAFB; nonspecific ST changes with no evidence of acute ischemia. Similar to previous ekg, but in NSR.    Labs on Admission: I have personally reviewed the available labs and imaging studies at the time of the admission.  Pertinent labs:   None     Assessment and Plan: * Acute CVA (cerebrovascular accident) ([place])- (present on admission) 81 year old male with history of PAF not on anticoagulation, HTN, HLD, CHF prseenting with left hand deficits and feeling "not right" found to have acute CVA in right posterior MCA territory.  -place in observation on  telemetry for stroke work-up -Neurochecks per protocol -Neurology consulted -MRI brain without contrast  -echocardiogram  -a1c/lipid panel for AM -had long conversation about eliquis with his PAF and Cha2Ds2-Vasc of 6. I discussed starting this today. Will start eliquis and have SW consult to see cost. If he can not afford will need to change him to plavix/asa -Permissive hypertension first 24 hours <190 -N.p.o. until bedside swallow screen -PT/ OT/ SLP consult   Paroxysmal atrial fibrillation ([place])- (present on admission) Hx of atrial flutter s/p ablation Continue coreg  Follows with cardiology and last seen in 2021 and documented not interested in systemic anticoagulation. Stopped in 01/2017 due to cost.  Currently in NSR, but with new CVA, would encourage DOAC. Had long discussion with him. 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 Global hypokinesis. Grade II diastolic dysfunction. Moderately decreased RV function.  Repeat echo today Intake/output and weights Continue medical management with lisinopril, coreg, lasix after permissive HTN with acute CVA Per cardiology note in 2021 not a good candidate of ICD insertion.   Essential hypertension- (present on admission) Allow for permissive HTN in first 24 -48 hours Holding home coreg, lisinopril, lasix  Prn lopressor if needed   Hypothyroidism- (present on admission) No TSH since 2021, repeat level pending Continue home dose of synthroid for now     Advance Care Planning:   Code  Status: Full Code   Consults: neurology: Dr. Theda Sers  DVT Prophylaxis: eliquis   Family Communication:   Severity of Illness: The appropriate patient status for this patient is OBSERVATION. Observation status is judged to be reasonable and necessary in order to provide the required intensity of service to ensure the patient's safety. The patient's presenting symptoms, physical exam findings, and initial radiographic and laboratory data in the context of their medical condition is felt to place them at decreased risk for further clinical deterioration. Furthermore, it is anticipated that the patient will be medically stable for discharge from the hospital within 2 midnights of admission.   Author: Orma Flaming, MD 08/26/2021 3:42 PM  For on call review [email or web address].

## 2021-08-26 NOTE — ED Notes (Signed)
Called lab to notify of labs that were sent down and orders are now in. Per lab they have them and will process labs.

## 2021-08-27 ENCOUNTER — Observation Stay (HOSPITAL_BASED_OUTPATIENT_CLINIC_OR_DEPARTMENT_OTHER): Payer: Medicare Other

## 2021-08-27 DIAGNOSIS — I6389 Other cerebral infarction: Secondary | ICD-10-CM

## 2021-08-27 DIAGNOSIS — I5042 Chronic combined systolic (congestive) and diastolic (congestive) heart failure: Secondary | ICD-10-CM

## 2021-08-27 DIAGNOSIS — I480 Paroxysmal atrial fibrillation: Secondary | ICD-10-CM | POA: Diagnosis not present

## 2021-08-27 DIAGNOSIS — F1010 Alcohol abuse, uncomplicated: Secondary | ICD-10-CM | POA: Diagnosis not present

## 2021-08-27 DIAGNOSIS — I639 Cerebral infarction, unspecified: Secondary | ICD-10-CM | POA: Diagnosis not present

## 2021-08-27 DIAGNOSIS — I10 Essential (primary) hypertension: Secondary | ICD-10-CM | POA: Diagnosis not present

## 2021-08-27 LAB — ECHOCARDIOGRAM COMPLETE
AR max vel: 1.07 cm2
AV Area VTI: 0.91 cm2
AV Area mean vel: 0.96 cm2
AV Mean grad: 9.4 mmHg
AV Peak grad: 17.2 mmHg
Ao pk vel: 2.07 m/s
Area-P 1/2: 4.96 cm2
Calc EF: 37 %
Height: 67 in
S' Lateral: 4.2 cm
Single Plane A2C EF: 40.4 %
Single Plane A4C EF: 26 %
Weight: 2640 oz

## 2021-08-27 MED ORDER — APIXABAN 5 MG PO TABS: 5.0000 mg | ORAL_TABLET | Freq: Two times a day (BID) | ORAL | 0 refills | Status: DC

## 2021-08-27 MED ORDER — ATORVASTATIN CALCIUM 20 MG PO TABS: 20.0000 mg | ORAL_TABLET | Freq: Every day | ORAL | 0 refills | Status: DC

## 2021-08-27 NOTE — Therapy (Signed)
OT Cancellation Note  Patient Details Name: Lucas Fuller MRN: [id number] DOB: June 17, 1941   Cancelled Treatment:      PT contacting OT due to concerns for new deficits since evaluation and possible need for OT follow-up via outpatient service. Pt off the floor for ECHO. Will continue efforts as schedule permits  Kasandra Knudsen 08/27/2021, 11:20 AM

## 2021-08-27 NOTE — Discharge Instructions (Signed)

## 2021-08-27 NOTE — Discharge Summary (Signed)
Physician Discharge Summary   Patient: Lucas Fuller MRN: [id number] DOB: 06-22-1941  Admit date:     08/26/2021  Discharge date: 08/27/21  Discharge Physician: Patrecia Pour   PCP: Merrilee Seashore, MD   Recommendations at discharge:   Follow up with PCP in 1-2 weeks. Consider monitoring lipid panel, LFTs. Starting atorvastatin 20mg  due to stroke, LDL 71.  Monitor for bleeding, changed aspirin to eliquis 5mg  BID due to embolic stroke. Per care management, the patient, as of Jan 1, is no longer in the [facility] Medicare coverage gap ("donut hole"), so co-pay for eliquis should be $45 as it was previous to it rising to $300 per pt previously.  Follow up with neurology in 6-8 weeks, referral made. Follow up with cardiology, Dr. Lovena Le as scheduled 2/21. Echo reveals stable cardiomyopathy and patient appears euvolemic.  Discharge Diagnoses: Principal Problem:   Acute CVA (cerebrovascular accident) ([place]) Active Problems:   Hypothyroidism   ETOH abuse   Paroxysmal atrial fibrillation (HCC)   Essential hypertension   Chronic combined systolic and diastolic CHF (congestive heart failure) [facility])  Hospital Course: Lucas Fuller is an 81 y.o. male with a history of PAF on aspirin, CHF, stroke w/L sided deficits, HTN who presented to the ED with right hand numbness and coordination difficulties. Code Stroke CT head with acute to subacute R MCA infarct. CTA head & neck thrombosed post. R MCA M2; mild atherosclerosis in head and neck. Small chronic bilateral cerebellar infarcts. MRI:  R MCA infarct, punctate acute infarct in R parietal lobe. Acute/subacute infarct in L centrum semiovale and L parietal periventricular white matter. Mild generalized parenchymal atrophy.  He was admitted with neurology consulting, strongly recommending anticoagulation in the setting of AFib and recurrent stroke with embolic pattern.   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 CHA2DS2-VASc score is 7 (CHF, HTN, Vascular disease [calcified aortic atherosclerosis on imaging], Stroke, Age). Really needs full dose anticoagulation. CM spoke with patient who reported his co-pay earlier in the year was $45 and later got much higher. He is able to afford $45/month and as of Jan 1 is no longer in that coverage gap. Would recommend alternative anticoagulation if necessary going forward if coverage gap occurs again.   - Atorvastatin 20mg  for LDL 71.  - Home health ordered and arranged as recommended.  Chronic combined systolic and diastolic CHF  Euvolemic. Echo 12/2019: EF of 30-35% with moderately decreased LVF. Global hypokinesis. Grade II diastolic dysfunction. Moderately decreased RV function.  Repeat echo 2/5 showed similar findings without cardioembolic source and no atrial shunt. Continue lisinopril, coreg, lasix. Per cardiology note in 2021 not a good candidate of ICD insertion.   Essential hypertension: Continue home meds  Paroxysmal atrial fibrillation (HCC)  Hx of atrial flutter s/p ablation Continue coreg, eliquis as above  ETOH abuse  Drink 2-3 mini liquors daily and sometimes a beer. Moderation counseling provided, no evidence of withdrawal currently.   Hypothyroidism: TSH 2.76.   Continue home dose of synthroid    Consultants: Neurology Procedures performed: Echo  Disposition: Home Diet recommendation:  Cardiac diet  DISCHARGE MEDICATION: Allergies as of 08/27/2021       Reactions   Asa [aspirin] Hives   Pt can tolerate 81 mg with no issues        Medication List     STOP taking these medications    ascorbic acid 500 MG tablet Commonly known as:  VITAMIN C   aspirin EC 81 MG tablet   folic acid 1 MG tablet Commonly known as: FOLVITE   guaiFENesin-dextromethorphan 100-10 MG/5ML syrup Commonly known as: ROBITUSSIN DM   ibuprofen 400 MG tablet Commonly known as: ADVIL   multivitamin with minerals Tabs tablet        TAKE these medications    acetaminophen 500 MG tablet Commonly known as: TYLENOL Take 1,000 mg by mouth every 6 (six) hours as needed for moderate pain or mild pain.   albuterol 108 (90 Base) MCG/ACT inhaler Commonly known as: VENTOLIN HFA Inhale 2 puffs into the lungs every 4 (four) hours as needed for wheezing or shortness of breath.   apixaban 5 MG Tabs tablet Commonly known as: ELIQUIS Take 1 tablet (5 mg total) by mouth 2 (two) times daily.   atorvastatin 20 MG tablet Commonly known as: LIPITOR Take 1 tablet (20 mg total) by mouth daily. Start taking on: August 28, 2021   carvedilol 6.25 MG tablet Commonly known as: COREG Take 1 tablet (6.25 mg total) by mouth 2 (two) times daily with a meal.   furosemide 40 MG tablet Commonly known as: LASIX Take 1 tablet (40 mg total) by mouth daily.   levothyroxine 50 MCG tablet Commonly known as: SYNTHROID Take 50 mcg by mouth daily before breakfast.   lisinopril 2.5 MG tablet Commonly known as: ZESTRIL Take 2.5 mg by mouth daily.   potassium chloride SA 20 MEQ tablet Commonly known as: Klor-Con M20 Take 1 tablet (20 mEq total) by mouth daily.        Follow-up Information     Merrilee Seashore, MD. Schedule an appointment as soon as possible for a visit.   Specialty: Internal Medicine Contact information: [address] [place] [address] [phone]         [facility]. Schedule an appointment as soon as possible for a visit in 1 month(s).   Specialty: Neurology Why: stroke clinic Contact information: [address] [place], [place] Follow up.   Why: for home health services to start on Wednesday. they will call you, please answer all phone calls until you talk to them Contact information: [address] [place] [address] [phone]                 Subjective: Feels well, right hand numbness improving. Mobilizing well, wants to go home.  Discharge Exam: BP 117/84 (BP Location: Right Arm)    Pulse 76    Temp 98.2 F (36.8 C) (Oral)    Resp 17    Ht 5\' 7"  (1.702 m)    Wt 74.8 kg    SpO2 99%    BMI 25.84 kg/m   Mild psychomotor slowing but oriented and conversant without dysarthria. Mild left facial droop. Peripheral strength intact. No pitting edema  Condition at discharge: stable  The results of significant diagnostics from this hospitalization (including imaging, microbiology, ancillary and laboratory) are listed below for reference.   Imaging Studies: CT Angio Head W or Wo Contrast  Result Date: 08/26/2021 CLINICAL DATA:  81 year old male with left side neurologic deficit, left side numbness and neglect, left visual field cut. EXAM: CT HEAD WITHOUT CONTRAST CT ANGIOGRAPHY HEAD AND NECK TECHNIQUE: Multidetector noncontrast head CT, CTA imaging of the head and neck was performed using the standard protocol during bolus administration of intravenous contrast. Multiplanar CT image reconstructions  and MIPs were obtained to evaluate the vascular anatomy. Carotid stenosis measurements (when applicable) are obtained utilizing NASCET criteria, using the distal internal carotid diameter as the denominator. RADIATION DOSE REDUCTION: This exam was performed according to the departmental dose-optimization program which includes automated exposure control, adjustment of the mA and/or kV according to patient size and/or use of iterative reconstruction technique. CONTRAST:  24mL OMNIPAQUE IOHEXOL 350 MG/ML SOLN COMPARISON:  Brain MRI 06/22/2009. Head CT 06/22/2009. CTA chest 08/18/2019. FINDINGS: CT HEAD Brain: Small right cerebellar infarct on series 4, image 9 is likely chronic given small size and conspicuity. Probable similar chronic left cerebellar infarct image 7. Patchy bilateral white matter hypodensity. Cytotoxic edema in the posterior right MCA  territory most apparent on series 4, image 21. Right basal ganglia and insula appear spared. No other areas of cytotoxic edema identified. ASPECTS 9 (abnormal M5 or M6). No acute intracranial hemorrhage identified. No intracranial mass effect or ventriculomegaly. Calvarium and skull base: No acute osseous abnormality identified. 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 Tympanic cavities and mastoids are well aerated. Orbits: Postoperative changes to both globes since 2010. No acute orbit or scalp soft tissue finding. CTA NECK Skeleton: Absent maxillary dentition. 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 No acute osseous abnormality identified. Upper chest: Negative. Other neck: Superficial dystrophic calcification 8 mm lateral to the right submandibular gland, significance doubtful. No acute neck soft tissue finding identified. Aortic arch: Calcified aortic atherosclerosis. 3 vessel arch configuration. Right carotid system: Minimal brachiocephalic and right CCA origin plaque without stenosis. Mild plaque at the right ICA origin and medial bulb without stenosis. Left carotid system: Mildly tortuous left CCA. Minimal plaque, no stenosis. Vertebral arteries: Minimal plaque in the proximal right subclavian artery. Non dominant, highly diminutive appearing proximal right vertebral artery with atherosclerosis at its origin resulting in mild to moderate stenosis, and some obscuration due to regional paravertebral venous contrast reflux. But the right V2 segment is patent. The vessel remains diminutive but patent to the skull base. Soft and calcified plaque in the proximal left subclavian artery without significant stenosis. Normal left vertebral artery origin. Dominant appearing left vertebral artery with tortuous V1 segment. Patent left vertebral to the skull base without stenosis. CTA HEAD Posterior circulation: Dominant left V4  segment. Normal left PICA origin. Diminutive right V4 but continues to the vertebrobasilar junction. Right AICA appears dominant. No significant distal vertebral or basilar stenosis. Patent SCA origins. Fetal type bilateral PCA origins. Bilateral PCA branches are within normal limits. Anterior circulation: Both ICA siphons are patent. Moderate left siphon calcified plaque but only mild associated stenosis. Normal left ophthalmic and posterior communicating artery origin. Right siphon similar moderate calcified plaque with no significant stenosis. Normal right ophthalmic and posterior communicating artery origins. Patent carotid termini, MCA and ACA origins. Normal anterior communicating artery. Bilateral ACA branches are within normal limits. Left MCA M1 segment and bifurcation are patent without stenosis. Left MCA branches are within normal limits. Right MCA M1 segment is patent. Right MCA bifurcation is suspected on series 9, image 106 with a thrombosed posterior M2 branch (series 9, image 106 there. However, other proximal right MCA branches appear patent and within normal limits. But there is a paucity of posterior right MCA M3/M4 branches. Venous sinuses: Early contrast timing, not well evaluated. Anatomic variants: Dominant left vertebral artery and fetal type PCA origins. Review of the MIP images confirms the above findings IMPRESSION: 1. Positive for acute to subacute posterior  Right MCA territory infarct, ASPECTS 9. No hemorrhage or mass effect. 2. Evidence of thrombosed posterior Right MCA M2 branch (series 9, image 106) at the bifurcation with a paucity of Right MCA M3/M4 branches. Right M1 patent without stenosis. 3. But mild for age underlying atherosclerosis in the head and neck. Some origin stenosis of the non dominant and diminutive right vertebral artery, but no other significant atherosclerotic stenosis. 4. Small chronic appearing bilateral cerebellar infarcts. 5. Aortic Atherosclerosis  (ICD10-I70.0). Study discussed by telephone with Dr. Marda Stalker on 08/26/2021 at 11:42 . Electronically Signed   By: Genevie Ann M.D.   On: 08/26/2021 11:49   CT HEAD WO CONTRAST  Result Date: 08/26/2021 CLINICAL DATA:  81 year old male with left side neurologic deficit, left side numbness and neglect, left visual field cut. EXAM: CT HEAD WITHOUT CONTRAST CT ANGIOGRAPHY HEAD AND NECK TECHNIQUE: Multidetector noncontrast head CT, CTA imaging of the head and neck was performed using the standard protocol during bolus administration of intravenous contrast. Multiplanar CT image reconstructions and MIPs were obtained to evaluate the vascular anatomy. Carotid stenosis measurements (when applicable) are obtained utilizing NASCET criteria, using the distal internal carotid diameter as the denominator. RADIATION DOSE REDUCTION: This exam was performed according to the departmental dose-optimization program which includes automated exposure control, adjustment of the mA and/or kV according to patient size and/or use of iterative reconstruction technique. CONTRAST:  42mL OMNIPAQUE IOHEXOL 350 MG/ML SOLN COMPARISON:  Brain MRI 06/22/2009. Head CT 06/22/2009. CTA chest 08/18/2019. FINDINGS: CT HEAD Brain: Small right cerebellar infarct on series 4, image 9 is likely chronic given small size and conspicuity. Probable similar chronic left cerebellar infarct image 7. Patchy bilateral white matter hypodensity. Cytotoxic edema in the posterior right MCA territory most apparent on series 4, image 21. Right basal ganglia and insula appear spared. No other areas of cytotoxic edema identified. ASPECTS 9 (abnormal M5 or M6). No acute intracranial hemorrhage identified. No intracranial mass effect or ventriculomegaly. Calvarium and skull base: No acute osseous abnormality identified. 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 Tympanic cavities and mastoids are well aerated. Orbits:  Postoperative changes to both globes since 2010. No acute orbit or scalp soft tissue finding. CTA NECK Skeleton: Absent maxillary dentition. 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 No acute osseous abnormality identified. Upper chest: Negative. Other neck: Superficial dystrophic calcification 8 mm lateral to the right submandibular gland, significance doubtful. No acute neck soft tissue finding identified. Aortic arch: Calcified aortic atherosclerosis. 3 vessel arch configuration. Right carotid system: Minimal brachiocephalic and right CCA origin plaque without stenosis. Mild plaque at the right ICA origin and medial bulb without stenosis. Left carotid system: Mildly tortuous left CCA. Minimal plaque, no stenosis. Vertebral arteries: Minimal plaque in the proximal right subclavian artery. Non dominant, highly diminutive appearing proximal right vertebral artery with atherosclerosis at its origin resulting in mild to moderate stenosis, and some obscuration due to regional paravertebral venous contrast reflux. But the right V2 segment is patent. The vessel remains diminutive but patent to the skull base. Soft and calcified plaque in the proximal left subclavian artery without significant stenosis. Normal left vertebral artery origin. Dominant appearing left vertebral artery with tortuous V1 segment. Patent left vertebral to the skull base without stenosis. CTA HEAD Posterior circulation: Dominant left V4 segment. Normal left PICA origin. Diminutive right V4 but continues to the vertebrobasilar junction. Right AICA appears dominant. No significant distal vertebral or basilar stenosis. Patent SCA origins. Fetal  type bilateral PCA origins. Bilateral PCA branches are within normal limits. Anterior circulation: Both ICA siphons are patent. Moderate left siphon calcified plaque but only mild associated stenosis. Normal left ophthalmic and posterior communicating artery origin. Right  siphon similar moderate calcified plaque with no significant stenosis. Normal right ophthalmic and posterior communicating artery origins. Patent carotid termini, MCA and ACA origins. Normal anterior communicating artery. Bilateral ACA branches are within normal limits. Left MCA M1 segment and bifurcation are patent without stenosis. Left MCA branches are within normal limits. Right MCA M1 segment is patent. Right MCA bifurcation is suspected on series 9, image 106 with a thrombosed posterior M2 branch (series 9, image 106 there. However, other proximal right MCA branches appear patent and within normal limits. But there is a paucity of posterior right MCA M3/M4 branches. Venous sinuses: Early contrast timing, not well evaluated. Anatomic variants: Dominant left vertebral artery and fetal type PCA origins. Review of the MIP images confirms the above findings IMPRESSION: 1. 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 No hemorrhage or mass effect. 2. Evidence of thrombosed posterior Right MCA M2 branch (series 9, image 106) at the bifurcation with a paucity of Right MCA M3/M4 branches. Right M1 patent without stenosis. 3. But mild for age underlying atherosclerosis in the head and neck. Some origin stenosis of the non dominant and diminutive right vertebral artery, but no other significant atherosclerotic stenosis. 4. Small chronic appearing bilateral cerebellar infarcts. 5. Aortic Atherosclerosis (ICD10-I70.0). Study discussed by telephone with Dr. Marda Stalker on 08/26/2021 at 11:42 . Electronically Signed   By: Genevie Ann M.D.   On: 08/26/2021 11:49   CT Angio Neck W and/or Wo Contrast  Result Date: 08/26/2021 CLINICAL DATA:  81 year old male with left side neurologic deficit, left side numbness and neglect, left visual field cut. EXAM: CT HEAD WITHOUT CONTRAST CT ANGIOGRAPHY HEAD AND NECK TECHNIQUE: Multidetector noncontrast head CT, CTA imaging of the head and neck was  performed using the standard protocol during bolus administration of intravenous contrast. Multiplanar CT image reconstructions and MIPs were obtained to evaluate the vascular anatomy. Carotid stenosis measurements (when applicable) are obtained utilizing NASCET criteria, using the distal internal carotid diameter as the denominator. RADIATION DOSE REDUCTION: This exam was performed according to the departmental dose-optimization program which includes automated exposure control, adjustment of the mA and/or kV according to patient size and/or use of iterative reconstruction technique. CONTRAST:  47mL OMNIPAQUE IOHEXOL 350 MG/ML SOLN COMPARISON:  Brain MRI 06/22/2009. Head CT 06/22/2009. CTA chest 08/18/2019. FINDINGS: CT HEAD Brain: Small right cerebellar infarct on series 4, image 9 is likely chronic given small size and conspicuity. Probable similar chronic left cerebellar infarct image 7. Patchy bilateral white matter hypodensity. Cytotoxic edema in the posterior right MCA territory most apparent on series 4, image 21. Right basal ganglia and insula appear spared. No other areas of cytotoxic edema identified. ASPECTS 9 (abnormal M5 or M6). No acute intracranial hemorrhage identified. No intracranial mass effect or ventriculomegaly. Calvarium and skull base: No acute osseous abnormality identified. 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 Tympanic cavities and mastoids are well aerated. Orbits: Postoperative changes to both globes since 2010. No acute orbit or scalp soft tissue finding. CTA NECK Skeleton: Absent maxillary dentition. 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 No acute osseous abnormality identified. Upper chest: Negative. Other neck: Superficial dystrophic calcification 8 mm lateral to the right submandibular gland, significance doubtful. No acute  neck soft tissue finding identified. Aortic arch: Calcified aortic  atherosclerosis. 3 vessel arch configuration. Right carotid system: Minimal brachiocephalic and right CCA origin plaque without stenosis. Mild plaque at the right ICA origin and medial bulb without stenosis. Left carotid system: Mildly tortuous left CCA. Minimal plaque, no stenosis. Vertebral arteries: Minimal plaque in the proximal right subclavian artery. Non dominant, highly diminutive appearing proximal right vertebral artery with atherosclerosis at its origin resulting in mild to moderate stenosis, and some obscuration due to regional paravertebral venous contrast reflux. But the right V2 segment is patent. The vessel remains diminutive but patent to the skull base. Soft and calcified plaque in the proximal left subclavian artery without significant stenosis. Normal left vertebral artery origin. Dominant appearing left vertebral artery with tortuous V1 segment. Patent left vertebral to the skull base without stenosis. CTA HEAD Posterior circulation: Dominant left V4 segment. Normal left PICA origin. Diminutive right V4 but continues to the vertebrobasilar junction. Right AICA appears dominant. No significant distal vertebral or basilar stenosis. Patent SCA origins. Fetal type bilateral PCA origins. Bilateral PCA branches are within normal limits. Anterior circulation: Both ICA siphons are patent. Moderate left siphon calcified plaque but only mild associated stenosis. Normal left ophthalmic and posterior communicating artery origin. Right siphon similar moderate calcified plaque with no significant stenosis. Normal right ophthalmic and posterior communicating artery origins. Patent carotid termini, MCA and ACA origins. Normal anterior communicating artery. Bilateral ACA branches are within normal limits. Left MCA M1 segment and bifurcation are patent without stenosis. Left MCA branches are within normal limits. Right MCA M1 segment is patent. Right MCA bifurcation is suspected on series 9, image 106 with a  thrombosed posterior M2 branch (series 9, image 106 there. However, other proximal right MCA branches appear patent and within normal limits. But there is a paucity of posterior right MCA M3/M4 branches. Venous sinuses: Early contrast timing, not well evaluated. Anatomic variants: Dominant left vertebral artery and fetal type PCA origins. Review of the MIP images confirms the above findings IMPRESSION: 1. 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 No hemorrhage or mass effect. 2. Evidence of thrombosed posterior Right MCA M2 branch (series 9, image 106) at the bifurcation with a paucity of Right MCA M3/M4 branches. Right M1 patent without stenosis. 3. But mild for age underlying atherosclerosis in the head and neck. Some origin stenosis of the non dominant and diminutive right vertebral artery, but no other significant atherosclerotic stenosis. 4. Small chronic appearing bilateral cerebellar infarcts. 5. Aortic Atherosclerosis (ICD10-I70.0). Study discussed by telephone with Dr. Marda Stalker on 08/26/2021 at 11:42 . Electronically Signed   By: Genevie Ann M.D.   On: 08/26/2021 11:49   MR BRAIN WO CONTRAST  Result Date: 08/26/2021 CLINICAL DATA:  Provided history: Neuro deficit, acute, stroke suspected. EXAM: MRI HEAD WITHOUT CONTRAST TECHNIQUE: Multiplanar, multiecho pulse sequences of the brain and surrounding structures were obtained without intravenous contrast. COMPARISON:  Noncontrast head CT and CT angiogram head/neck 08/26/2021. Brain MRI 06/22/2009. FINDINGS: Brain: Mild generalized cerebral and cerebellar atrophy. 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 Separate punctate acute infarct within the right parietal lobe (series 5, image 82). 8 mm acute/early subacute infarct within the left centrum semiovale (series 5, image 88). Additional  subcentimeter acute/early subacute infarct within the left parietal lobe periventricular white matter (series 5, image 84). Background moderate multifocal T2 FLAIR hyperintense signal abnormality within the  cerebral white matter, nonspecific but compatible chronic small vessel ischemic disease. Mild chronic small vessel ischemic changes are also present within the pons. There are small chronic infarcts within the bilateral cerebellar hemispheres which are new from the prior exam. Partially empty sella turcica. No evidence of an intracranial mass. No chronic intracranial blood products. No extra-axial fluid collection. No midline shift. Vascular: The intracranial arterial vasculature was better characterized on the CTA head/neck performed earlier today. Skull and upper cervical spine: No focal suspicious marrow lesion. Sinuses/Orbits: Visualized orbits show no acute finding. Bilateral lens replacements. Trace mucosal thickening within the bilateral frontal sinuses. Near complete opacification of the bilateral ethmoid sinuses secondary to the presence of mucosal thickening and fluid. Mild mucosal thickening small volume frothy secretions within the left sphenoid sinus. Mild mucosal thickening within the right sphenoid sinus. Mild mucosal thickening within the bilateral maxillary sinuses. IMPRESSION: 1. 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 2. Separate punctate acute infarct within the right parietal lobe. 3. 8 mm acute/early subacute infarct within the left centrum semiovale. 4. Subcentimeter acute/early subacute infarct within the left parietal lobe periventricular white matter. 5. Involvement of multiple vascular territories raises the possibility of an embolic process. 6. Background moderate chronic small vessel ischemic changes within the cerebral white matter, and to a lesser degree  within the pons, progressed from the prior brain MRI of 06/22/2009. 7. 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 8. Mild generalized parenchymal atrophy. 9. Paranasal sinus disease, as described. Electronically Signed   By: Kellie Simmering D.O.   On: 08/26/2021 17:03   ECHOCARDIOGRAM COMPLETE  Result Date: 08/27/2021    ECHOCARDIOGRAM REPORT   Patient Name:   Lucas Fuller Date of Exam: 08/27/2021 Medical Rec #:  [id number]      Height:       67.0 in Accession #:    [id number]     Weight:       165.0 lb Date of Birth:  02-Apr-1941      BSA:          1.863 m Patient Age:    81 years       BP:           124/81 mmHg Patient Gender: M              HR:           69 bpm. Exam Location:  Inpatient Procedure: 2D Echo, Cardiac Doppler, Color Doppler and 3D Echo Indications:    Stroke I63.9  History:        Patient has prior history of Echocardiogram examinations, most                 recent 01/07/2020. CHF, Aortic Valve Disease; Arrythmias:Atrial                 Fibrillation.  Sonographer:    Merrie Roof RDCS Referring Phys: [id number] [place]  1. EF similar to echo done 01/07/20 . Left ventricular ejection fraction, by estimation, is 30 to 35%. The left ventricle has moderately decreased function. The left ventricle demonstrates global hypokinesis. The left ventricular internal cavity size was  moderately dilated. There is mild left ventricular hypertrophy. Left ventricular diastolic parameters are consistent with Grade II diastolic dysfunction (pseudonormalization). Elevated left ventricular end-diastolic pressure.  2. Right ventricular systolic function is normal. The right ventricular size is normal. There  is mildly elevated pulmonary artery systolic pressure.  3. Left atrial size was moderately dilated.  4. Right atrial size was mildly dilated.  5. The mitral valve is abnormal. Mild mitral valve regurgitation. No evidence of mitral stenosis.  6. Calcified non  coronary cusp. The aortic valve is tricuspid. There is moderate calcification of the aortic valve. There is moderate thickening of the aortic valve. Aortic valve regurgitation is trivial. Mild to moderate aortic valve stenosis.  7. The inferior vena cava is normal in size with greater than 50% respiratory variability, suggesting right atrial pressure of 3 mmHg. FINDINGS  Left Ventricle: EF similar to echo done 01/07/20. Left ventricular ejection fraction, by estimation, is 30 to 35%. The left ventricle has moderately decreased function. The left ventricle demonstrates global hypokinesis. The left ventricular internal cavity size was moderately dilated. There is mild left ventricular hypertrophy. Left ventricular diastolic parameters are consistent with Grade II diastolic dysfunction (pseudonormalization). Elevated left ventricular end-diastolic pressure. Right Ventricle: The right ventricular size is normal. No increase in right ventricular wall thickness. Right ventricular systolic function is normal. There is mildly elevated pulmonary artery systolic pressure. The tricuspid regurgitant velocity is 3.15  m/s, and with an assumed right atrial pressure of 3 mmHg, the estimated right ventricular systolic pressure is 46.2 mmHg. Left Atrium: Left atrial size was moderately dilated. Right Atrium: Right atrial size was mildly dilated. Pericardium: There is no evidence of pericardial effusion. Mitral Valve: The mitral valve is abnormal. There is moderate thickening of the mitral valve leaflet(s). Mild mitral valve regurgitation. No evidence of mitral valve stenosis. Tricuspid Valve: The tricuspid valve is normal in structure. Tricuspid valve regurgitation is mild . No evidence of tricuspid stenosis. Aortic Valve: Calcified non coronary cusp. The aortic valve is tricuspid. There is moderate calcification of the aortic valve. There is moderate thickening of the aortic valve. Aortic valve regurgitation is trivial. Mild to  moderate aortic stenosis is present. Aortic valve mean gradient measures 9.4 mmHg. Aortic valve peak gradient measures 17.2 mmHg. Aortic valve area, by VTI measures 0.91 cm. Pulmonic Valve: The pulmonic valve was normal in structure. Pulmonic valve regurgitation is mild. No evidence of pulmonic stenosis. Aorta: The aortic root is normal in size and structure. Venous: The inferior vena cava is normal in size with greater than 50% respiratory variability, suggesting right atrial pressure of 3 mmHg. IAS/Shunts: No atrial level shunt detected by color flow Doppler.  LEFT VENTRICLE PLAX 2D LVIDd:         5.00 cm      Diastology LVIDs:         4.20 cm      LV e' medial:    3.26 cm/s LV PW:         1.20 cm      LV E/e' medial:  33.7 LV IVS:        1.20 cm      LV e' lateral:   6.53 cm/s LVOT diam:     2.00 cm      LV E/e' lateral: 16.8 LV SV:         36 LV SV Index:   20 LVOT Area:     3.14 cm                              3D Volume EF: LV Volumes (MOD)            3D EF:  35 % LV vol d, MOD A2C: 178.0 ml LV EDV:       149 ml LV vol d, MOD A4C: 121.0 ml LV ESV:       97 ml LV vol s, MOD A2C: 106.0 ml LV SV:        52 ml LV vol s, MOD A4C: 89.6 ml LV SV MOD A2C:     72.0 ml LV SV MOD A4C:     121.0 ml LV SV MOD BP:      57.4 ml RIGHT VENTRICLE RV Basal diam:  4.00 cm RV Mid diam:    2.60 cm RV S prime:     10.60 cm/s TAPSE (M-mode): 1.8 cm LEFT ATRIUM            Index        RIGHT ATRIUM           Index LA diam:      4.50 cm  2.41 cm/m   RA Area:     24.60 cm LA Vol (A2C): 139.0 ml 74.59 ml/m  RA Volume:   89.00 ml  47.76 ml/m  AORTIC VALVE AV Area (Vmax):    1.07 cm AV Area (Vmean):   0.96 cm AV Area (VTI):     0.91 cm AV Vmax:           207.20 cm/s AV Vmean:          141.200 cm/s AV VTI:            0.401 m AV Peak Grad:      17.2 mmHg AV Mean Grad:      9.4 mmHg LVOT Vmax:         70.36 cm/s LVOT Vmean:        43.240 cm/s LVOT VTI:          0.116 m LVOT/AV VTI ratio: 0.29  AORTA Ao Root diam: 3.40 cm MITRAL  VALVE                TRICUSPID VALVE MV Area (PHT): 4.96 cm     TR Peak grad:   39.7 mmHg MV Decel Time: 153 msec     TR Vmax:        315.00 cm/s MV E velocity: 110.00 cm/s MV A velocity: 40.80 cm/s   SHUNTS MV E/A ratio:  2.70         Systemic VTI:  0.12 m                             Systemic Diam: 2.00 cm Jenkins Rouge MD Electronically signed by Jenkins Rouge MD Signature Date/Time: 08/27/2021/1:28:36 PM    Final     Microbiology: Results for orders placed or performed during the hospital encounter of 08/26/21  Resp Panel by RT-PCR (Flu A&B, Covid) Nasopharyngeal Swab     Status: None   Collection Time: 08/26/21 10:47 AM   Specimen: Nasopharyngeal Swab; Nasopharyngeal(NP) swabs in vial transport medium  Result Value Ref Range Status   SARS Coronavirus 2 by RT PCR NEGATIVE NEGATIVE Final    Comment: (NOTE) SARS-CoV-2 target nucleic acids are NOT DETECTED.  The SARS-CoV-2 RNA is generally detectable in upper respiratory specimens during the acute phase of infection. The lowest concentration of SARS-CoV-2 viral copies this assay can detect is 138 copies/mL. A negative result does not preclude SARS-Cov-2 infection and should not be used as the sole basis for treatment or other patient management  decisions. A negative result may occur with  improper specimen collection/handling, submission of specimen other than nasopharyngeal swab, presence of viral mutation(s) within the areas targeted by this assay, and inadequate number of viral copies(<138 copies/mL). A negative result must be combined with clinical observations, patient history, and epidemiological information. The expected result is Negative.  Fact Sheet for Patients:  EntrepreneurPulse.com.au  Fact Sheet for Healthcare Providers:  IncredibleEmployment.be  This test is no t yet approved or cleared by the [place] FDA and  has been authorized for detection and/or diagnosis of SARS-CoV-2 by FDA  under an Emergency Use Authorization (EUA). This EUA will remain  in effect (meaning this test can be used) for the duration of the COVID-19 declaration under Section 564(b)(1) of the Act, 21 U.S.C.section 360bbb-3(b)(1), unless the authorization is terminated  or revoked sooner.       Influenza A by PCR NEGATIVE NEGATIVE Final   Influenza B by PCR NEGATIVE NEGATIVE Final    Comment: (NOTE) The Xpert Xpress SARS-CoV-2/FLU/RSV plus assay is intended as an aid in the diagnosis of influenza from Nasopharyngeal swab specimens and should not be used as a sole basis for treatment. Nasal washings and aspirates are unacceptable for Xpert Xpress SARS-CoV-2/FLU/RSV testing.  Fact Sheet for Patients: EntrepreneurPulse.com.au  Fact Sheet for Healthcare Providers: IncredibleEmployment.be  This test is not yet approved or cleared by the [place] FDA and has been authorized for detection and/or diagnosis of SARS-CoV-2 by FDA under an Emergency Use Authorization (EUA). This EUA will remain in effect (meaning this test can be used) for the duration of the COVID-19 declaration under Section 564(b)(1) of the Act, 21 U.S.C. section 360bbb-3(b)(1), unless the authorization is terminated or revoked.  Performed at [facility], [place] [address] [place], [place] [address]     Labs: CBC: Recent Labs  Lab 08/26/21 1047 08/26/21 1056  WBC 6.7  --   NEUTROABS 4.6  --   HGB 14.0 15.0  HCT 42.4 44.0  MCV 101.7*  --   PLT PLATELET CLUMPS NOTED ON SMEAR, UNABLE TO ESTIMATE  --    Basic Metabolic Panel: Recent Labs  Lab 08/26/21 1047 08/26/21 1056  NA 138 138  K 4.0 4.0  CL 102 104  CO2 21*  --   GLUCOSE 91 86  BUN 15 19  CREATININE 1.12 1.00  CALCIUM 8.9  --    Liver Function Tests: Recent Labs  Lab 08/26/21 1047  AST 38  ALT 22  ALKPHOS 60  BILITOT 1.7*  PROT 7.5  ALBUMIN 3.9   CBG: Recent Labs  Lab 08/26/21 1005  GLUCAP 78     Discharge time spent: greater than 30 minutes.  Signed: Patrecia Pour, MD [facility] 08/27/2021

## 2021-08-27 NOTE — TOC Transition Note (Signed)
Transition of Care [facility]) - CM/SW Discharge Note   Patient Details  Name: Lucas Fuller MRN: [id number] Date of Birth: 29-Jul-1940  Transition of Care [facility]) CM/SW Contact:  Carles Collet, RN Phone Number: 08/27/2021, 1:23 PM   Clinical Narrative:    Damaris Schooner w patient and discussed Eliquis. He states that he was having a hard time affording it last year while he was in the donut hole, coverage gap. Prior to it was $45, and he states that he could afford the $45. Explained that new benefit period started Jan1 and he would not be in the coverage yet and that he should expect cost to be $45.  We discussed recommendations for [facility] and he does not have a preference for agency, referral accepted o [facility] with [facility] Wednesday 2/8. He declined DME needs. He states that a friend will be able to pick him up.     Final next level of care: [place] Barriers to Discharge: No Barriers Identified   Patient Goals and CMS Choice Patient states their goals for this hospitalization and ongoing recovery are:: return home CMS Medicare.gov Compare Post Acute Care list provided to:: Patient Choice offered to / list presented to : Patient  Discharge Placement                       Discharge Plan and Services                DME Arranged: N/A         HH Arranged: PT, OT [place] Agency: [place] ([facility]) Date [place]: 08/27/21 Time [place]: 6948 Representative spoke with at [place]: [place] ([place]) Interventions     Readmission Risk Interventions No flowsheet data found.

## 2021-08-27 NOTE — Progress Notes (Signed)
°  Echocardiogram 2D Echocardiogram has been performed.  Lucas Fuller F 08/27/2021, 11:38 AM

## 2021-08-27 NOTE — Evaluation (Signed)
Physical Therapy Evaluation Patient Details Name: Lucas Fuller MRN: [id number] DOB: January 09, 1941 Today's Date: 08/27/2021  History of Present Illness  Pt is an 81 y.o. male who presented 08/26/21 with L hand sensation changes. CT scan: Positive for acute to subacute posterior Right MCA territory infarct. MRI revealed acute cortical/subcortical posterior right MCA territory infarct within the right parietal lobe, right temporoparietal junction and posterior right insula, separate punctate acute infarct within the right parietal lobe, acute/early subacute infarct within the left centrum  semiovale, and acute/early subacute infarct within the left  parietal lobe periventricular white matter. Outside of the window for TKN. PMHx: PAF, CHF, HTN, hx of bladder cancer, hypothyroidism   Clinical Impression  Pt presents with condition above and deficits mentioned below, see PT Problem List. PTA, he was independent without an AD, living with his roommate in a 1-level house with 3-4 STE. The roommate is disabled, and cannot assist the patient. The patient continues to drive and works part time as a Biomedical scientist at [employer]. Currently, pt displays deficits in cognition, balance, L leg sensation, L UE sensation, and L UE proprioception that place him at risk for falls and injuries when trying to perform his job duties or drive. Pt with slow processing and decreased attention span, maintaining a flat affect throughout. Pt with poor awareness into the extent of his L UE deficits, needing repeated cues to visually attend to L hand for placement on handrail or RW. Pt dropped the phone and had difficulty donning his mask with his L hand without maxA on the L side also. Educated pt on his deficits in L foot clearance and L UE proprioception that impact his safety. Pt is able to mobilize without UE support at a min guard-supervision level, but is at risk for falls, indicated by his DGI score of 12 today. Will continue to follow  acutely. Coordinating with OT as pt'a presentation appears to have changed since OT Eval. Recommending HHPT, particularly for work conditioning and balance training.     Recommendations for follow up therapy are one component of a multi-disciplinary discharge planning process, led by the attending physician.  Recommendations may be updated based on patient status, additional functional criteria and insurance authorization.  Follow Up Recommendations Home health PT    Assistance Recommended at Discharge Intermittent Supervision/Assistance  Patient can return home with the following  A little help with bathing/dressing/bathroom;Assistance with cooking/housework;Direct supervision/assist for medications management;Direct supervision/assist for financial management;Assist for transportation    Equipment Recommendations None recommended by PT  Recommendations for Other Services  OT consult    Functional Status Assessment Patient has had a recent decline in their functional status and demonstrates the ability to make significant improvements in function in a reasonable and predictable amount of time.     Precautions / Restrictions Precautions Precautions: Fall Precaution Comments: Watch BP Restrictions Weight Bearing Restrictions: No      Mobility  Bed Mobility Overal bed mobility: Modified Independent             General bed mobility comments: Pt able to transition supine > sit with HOB elevated without assistance.    Transfers Overall transfer level: Needs assistance Equipment used: Rolling walker (2 wheels) Transfers: Sit to/from Stand Sit to Stand: Supervision           General transfer comment: Pt cued to push up from bed to stand at [facility], supervision for safety with pt leaning posterior aspects of legs against bed for support. Needed cues to  correct L hand placement as he initially grabbed the bar below the hand grip, needing cues to look at hand and correct.     Ambulation/Gait Ambulation/Gait assistance: Min guard Gait Distance (Feet): 260 Feet Assistive device: Rolling walker (2 wheels), None Gait Pattern/deviations: Step-through pattern, Decreased stride length, Shuffle, Decreased dorsiflexion - left Gait velocity: reduced Gait velocity interpretation: <1.8 ft/sec, indicate of risk for recurrent falls   General Gait Details: Pt with slow, shuffling gait, displaying decreased L heel strike and foot clearance. Educated pt to be aware of clearing foot with obstacles. Slows gait to accomodate for balance challenges, but no LOB, min guard for safety. Used RW for initial ~20 ft then quickly progressed to no UE support.  Stairs Stairs: Yes Stairs assistance: Min guard Stair Management: One rail Right, One rail Left, Alternating pattern, Step to pattern, Forwards Number of Stairs: 2 General stair comments: Ascends with R rail with reciprocal pattern and descends with L rail with step-to pattern. No LOB, min guard for safety. Pt dragging L hand posteriorly on rail, needing cues to visually attend to hand and bring with him.  Wheelchair Mobility    Modified Rankin (Stroke Patients Only) Modified Rankin (Stroke Patients Only) Pre-Morbid Rankin Score: No symptoms Modified Rankin: Moderately severe disability     Balance Overall balance assessment: Mild deficits observed, not formally tested                               Standardized Balance Assessment Standardized Balance Assessment : Dynamic Gait Index   Dynamic Gait Index Level Surface: Mild Impairment Change in Gait Speed: Mild Impairment Gait with Horizontal Head Turns: Mild Impairment Gait with Vertical Head Turns: Moderate Impairment Gait and Pivot Turn: Mild Impairment Step Over Obstacle: Moderate Impairment Step Around Obstacles: Moderate Impairment Steps: Moderate Impairment Total Score: 12       Pertinent Vitals/Pain Pain Assessment Pain Assessment:  Faces Faces Pain Scale: No hurt Pain Intervention(s): Monitored during session    Home Living Family/patient expects to be discharged to:: Private residence Living Arrangements: Non-relatives/Friends Available Help at Discharge: Family;Available PRN/intermittently Type of Home: House Home Access: Stairs to enter Entrance Stairs-Rails: Right Entrance Stairs-Number of Steps: 3 or 4   Home Layout: One level Home Equipment: Shower seat Additional Comments: Has a roommate that pays him rent.  Roommate is disabled at W/c level, and has PCA assist 7x/wk.    Prior Function Prior Level of Function : Independent/Modified Independent;Working/employed;Driving             Mobility Comments: No AD ADLs Comments: No assist with ADL/IADL.  Works part time as a Biomedical scientist at [employer]: Right    Extremity/Trunk Assessment   Upper Extremity Assessment Upper Extremity Assessment: LUE deficits/detail LUE Deficits / Details: Decreased sensation and impaired proprioception LUE Sensation: decreased proprioception;decreased light touch    Lower Extremity Assessment Lower Extremity Assessment: LLE deficits/detail LLE Deficits / Details: Did not detect light touch at thigh and delayed to detect touch at foot (likely slightly impaired sensation), appeared intact with appropriate response time at knee; MMT scores of 4+ to 5 grossly symmetrical to R side; coordination appears intact LLE Sensation: decreased light touch    Cervical / Trunk Assessment Cervical / Trunk Assessment: Kyphotic  Communication   Communication: No difficulties  Cognition Arousal/Alertness: Awake/alert Behavior During Therapy: Flat affect Overall Cognitive Status: Impaired/Different from baseline Area of Impairment:  Attention, Safety/judgement, Problem solving                   Current Attention Level: Sustained     Safety/Judgement: Decreased awareness of deficits, Decreased  awareness of safety   Problem Solving: Slow processing, Requires verbal cues General Comments: Pt with very flat affect and needs cues to attend to therapist at times. Slow prcoessing of multi-step commands, needing to break cues down to simple steps at times. Poor awareness of L UE sensation deficits, needing cues to attend to L UE often.        General Comments General comments (skin integrity, edema, etc.): Educated pt to not go back to work yet due to safety risks due to poor proprioception and awareness of L hand deficits.    Exercises     Assessment/Plan    PT Assessment Patient needs continued PT services  PT Problem List Decreased balance;Decreased mobility;Decreased cognition;Decreased safety awareness;Impaired sensation       PT Treatment Interventions DME instruction;Gait training;Stair training;Functional mobility training;Therapeutic activities;Therapeutic exercise;Balance training;Neuromuscular re-education;Cognitive remediation;Patient/family education    PT Goals (Current goals can be found in the Care Plan section)  Acute Rehab PT Goals Patient Stated Goal: to go back to work eventually PT Goal Formulation: With patient Time For Goal Achievement: 09/10/21 Potential to Achieve Goals: Good    Frequency Min 4X/week     Co-evaluation               AM-PAC PT "6 Clicks" Mobility  Outcome Measure Help needed turning from your back to your side while in a flat bed without using bedrails?: None Help needed moving from lying on your back to sitting on the side of a flat bed without using bedrails?: None Help needed moving to and from a bed to a chair (including a wheelchair)?: A Little Help needed standing up from a chair using your arms (e.g., wheelchair or bedside chair)?: A Little Help needed to walk in hospital room?: A Little Help needed climbing 3-5 steps with a railing? : A Little 6 Click Score: 20    End of Session Equipment Utilized During  Treatment: Gait belt Activity Tolerance: Patient tolerated treatment well Patient left: in chair;with call bell/phone within reach;with chair alarm set;Other (comment) (cord connect to wall broken for chair alarm) Nurse Communication: Mobility status PT Visit Diagnosis: Unsteadiness on feet (R26.81);Other abnormalities of gait and mobility (R26.89);Difficulty in walking, not elsewhere classified (R26.2);Other symptoms and signs involving the nervous system (R29.898)    Time: 0349-1791 PT Time Calculation (min) (ACUTE ONLY): 33 min   Charges:   PT Evaluation $PT Eval Moderate Complexity: 1 Mod PT Treatments $Therapeutic Activity: 8-22 mins        Moishe Spice, PT, DPT Acute Rehabilitation Services  Pager: [phone] Office: [phone]   Orvan Falconer 08/27/2021, 9:16 AM

## 2021-08-27 NOTE — Care Management Obs Status (Signed)
[place] NOTIFICATION   Patient Details  Name: Lucas Fuller MRN: [id number] Date of Birth: Apr 23, 1941   Medicare Observation Status Notification Given:  Yes    Carles Collet, RN 08/27/2021, 1:23 PM

## 2021-08-27 NOTE — Progress Notes (Addendum)
STROKE TEAM PROGRESS NOTE   SUBJECTIVE (INTERVAL HISTORY) He is seen alone at the bedside.  Overall his condition is stable. Patient reports that on Friday while driving home from work, he experienced sensory deficits in R hand and the next morning had difficulties in coordination while dressing himself. Today, he reports feeling largely back to normal. 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   Patient reports history of Eliquis d/c d/t inability to afford co-pay. He has since been taking baby aspirin daily.   OBJECTIVE CBC:  Recent Labs  Lab 08/26/21 1047 08/26/21 1056  WBC 6.7  --   NEUTROABS 4.6  --   HGB 14.0 15.0  HCT 42.4 44.0  MCV 101.7*  --   PLT PLATELET CLUMPS NOTED ON SMEAR, UNABLE TO ESTIMATE  --    Basic Metabolic Panel:  Recent Labs  Lab 08/26/21 1047 08/26/21 1056  NA 138 138  K 4.0 4.0  CL 102 104  CO2 21*  --   GLUCOSE 91 86  BUN 15 19  CREATININE 1.12 1.00  CALCIUM 8.9  --    Lipid Panel:  Recent Labs  Lab 08/26/21 1047  CHOL 160  TRIG 71  HDL 75  CHOLHDL 2.1  VLDL 14  LDLCALC 71   HgbA1c:  Recent Labs  Lab 08/26/21 1047  HGBA1C 4.8   Urine Drug Screen:  Recent Labs  Lab 08/26/21 1047  LABOPIA NONE DETECTED  COCAINSCRNUR NONE DETECTED  LABBENZ NONE DETECTED  AMPHETMU NONE DETECTED  THCU NONE DETECTED  LABBARB NONE DETECTED    Alcohol Level  Recent Labs  Lab 08/26/21 1047  ETH <10    IMAGING past 24 hours CT Angio Head W or Wo Contrast  Result Date: 08/26/2021 CLINICAL DATA:  81 year old male with left side neurologic deficit, left side numbness and neglect, left visual field cut. EXAM: CT HEAD WITHOUT CONTRAST CT ANGIOGRAPHY HEAD AND NECK TECHNIQUE: Multidetector noncontrast head CT, CTA imaging of the head and neck was performed using the standard protocol during bolus administration of intravenous contrast. Multiplanar CT image reconstructions and MIPs were obtained to  evaluate the vascular anatomy. Carotid stenosis measurements (when applicable) are obtained utilizing NASCET criteria, using the distal internal carotid diameter as the denominator. RADIATION DOSE REDUCTION: This exam was performed according to the departmental dose-optimization program which includes automated exposure control, adjustment of the mA and/or kV according to patient size and/or use of iterative reconstruction technique. CONTRAST:  84mL OMNIPAQUE IOHEXOL 350 MG/ML SOLN COMPARISON:  Brain MRI 06/22/2009. Head CT 06/22/2009. CTA chest 08/18/2019. FINDINGS: CT HEAD Brain: Small right cerebellar infarct on series 4, image 9 is likely chronic given small size and conspicuity. Probable similar chronic left cerebellar infarct image 7. Patchy bilateral white matter hypodensity. Cytotoxic edema in the posterior right MCA territory most apparent on series 4, image 21. Right basal ganglia and insula appear spared. No other areas of cytotoxic edema identified. ASPECTS 9 (abnormal M5 or M6). No acute intracranial hemorrhage identified. No intracranial mass effect or ventriculomegaly. Calvarium and skull base: No acute osseous abnormality identified. 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 Tympanic cavities and mastoids are well aerated. Orbits: Postoperative changes to both globes since 2010. No acute orbit or scalp soft tissue finding. CTA NECK Skeleton: Absent maxillary dentition. 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 No acute osseous abnormality identified. Upper chest: Negative. Other neck: Superficial dystrophic calcification 8 mm lateral  to the right submandibular gland, significance doubtful. No acute neck soft tissue finding identified. Aortic arch: Calcified aortic atherosclerosis. 3 vessel arch configuration. Right carotid system: Minimal brachiocephalic and right CCA origin plaque without stenosis. Mild plaque at  the right ICA origin and medial bulb without stenosis. Left carotid system: Mildly tortuous left CCA. Minimal plaque, no stenosis. Vertebral arteries: Minimal plaque in the proximal right subclavian artery. Non dominant, highly diminutive appearing proximal right vertebral artery with atherosclerosis at its origin resulting in mild to moderate stenosis, and some obscuration due to regional paravertebral venous contrast reflux. But the right V2 segment is patent. The vessel remains diminutive but patent to the skull base. Soft and calcified plaque in the proximal left subclavian artery without significant stenosis. Normal left vertebral artery origin. Dominant appearing left vertebral artery with tortuous V1 segment. Patent left vertebral to the skull base without stenosis. CTA HEAD Posterior circulation: Dominant left V4 segment. Normal left PICA origin. Diminutive right V4 but continues to the vertebrobasilar junction. Right AICA appears dominant. No significant distal vertebral or basilar stenosis. Patent SCA origins. Fetal type bilateral PCA origins. Bilateral PCA branches are within normal limits. Anterior circulation: Both ICA siphons are patent. Moderate left siphon calcified plaque but only mild associated stenosis. Normal left ophthalmic and posterior communicating artery origin. Right siphon similar moderate calcified plaque with no significant stenosis. Normal right ophthalmic and posterior communicating artery origins. Patent carotid termini, MCA and ACA origins. Normal anterior communicating artery. Bilateral ACA branches are within normal limits. Left MCA M1 segment and bifurcation are patent without stenosis. Left MCA branches are within normal limits. Right MCA M1 segment is patent. Right MCA bifurcation is suspected on series 9, image 106 with a thrombosed posterior M2 branch (series 9, image 106 there. However, other proximal right MCA branches appear patent and within normal limits. But there is a  paucity of posterior right MCA M3/M4 branches. Venous sinuses: Early contrast timing, not well evaluated. Anatomic variants: Dominant left vertebral artery and fetal type PCA origins. Review of the MIP images confirms the above findings IMPRESSION: 1. 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 No hemorrhage or mass effect. 2. Evidence of thrombosed posterior Right MCA M2 branch (series 9, image 106) at the bifurcation with a paucity of Right MCA M3/M4 branches. Right M1 patent without stenosis. 3. But mild for age underlying atherosclerosis in the head and neck. Some origin stenosis of the non dominant and diminutive right vertebral artery, but no other significant atherosclerotic stenosis. 4. Small chronic appearing bilateral cerebellar infarcts. 5. Aortic Atherosclerosis (ICD10-I70.0). Study discussed by telephone with Dr. Marda Stalker on 08/26/2021 at 11:42 . Electronically Signed   By: Genevie Ann M.D.   On: 08/26/2021 11:49   CT HEAD WO CONTRAST  Result Date: 08/26/2021 CLINICAL DATA:  81 year old male with left side neurologic deficit, left side numbness and neglect, left visual field cut. EXAM: CT HEAD WITHOUT CONTRAST CT ANGIOGRAPHY HEAD AND NECK TECHNIQUE: Multidetector noncontrast head CT, CTA imaging of the head and neck was performed using the standard protocol during bolus administration of intravenous contrast. Multiplanar CT image reconstructions and MIPs were obtained to evaluate the vascular anatomy. Carotid stenosis measurements (when applicable) are obtained utilizing NASCET criteria, using the distal internal carotid diameter as the denominator. RADIATION DOSE REDUCTION: This exam was performed according to the departmental dose-optimization program which includes automated exposure control, adjustment of the mA and/or kV according to patient size and/or use of iterative reconstruction  technique. CONTRAST:  59mL OMNIPAQUE IOHEXOL 350 MG/ML SOLN  COMPARISON:  Brain MRI 06/22/2009. Head CT 06/22/2009. CTA chest 08/18/2019. FINDINGS: CT HEAD Brain: Small right cerebellar infarct on series 4, image 9 is likely chronic given small size and conspicuity. Probable similar chronic left cerebellar infarct image 7. Patchy bilateral white matter hypodensity. Cytotoxic edema in the posterior right MCA territory most apparent on series 4, image 21. Right basal ganglia and insula appear spared. No other areas of cytotoxic edema identified. ASPECTS 9 (abnormal M5 or M6). No acute intracranial hemorrhage identified. No intracranial mass effect or ventriculomegaly. Calvarium and skull base: No acute osseous abnormality identified. 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 Tympanic cavities and mastoids are well aerated. Orbits: Postoperative changes to both globes since 2010. No acute orbit or scalp soft tissue finding. CTA NECK Skeleton: Absent maxillary dentition. 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 No acute osseous abnormality identified. Upper chest: Negative. Other neck: Superficial dystrophic calcification 8 mm lateral to the right submandibular gland, significance doubtful. No acute neck soft tissue finding identified. Aortic arch: Calcified aortic atherosclerosis. 3 vessel arch configuration. Right carotid system: Minimal brachiocephalic and right CCA origin plaque without stenosis. Mild plaque at the right ICA origin and medial bulb without stenosis. Left carotid system: Mildly tortuous left CCA. Minimal plaque, no stenosis. Vertebral arteries: Minimal plaque in the proximal right subclavian artery. Non dominant, highly diminutive appearing proximal right vertebral artery with atherosclerosis at its origin resulting in mild to moderate stenosis, and some obscuration due to regional paravertebral venous contrast reflux. But the right V2 segment is patent. The vessel remains  diminutive but patent to the skull base. Soft and calcified plaque in the proximal left subclavian artery without significant stenosis. Normal left vertebral artery origin. Dominant appearing left vertebral artery with tortuous V1 segment. Patent left vertebral to the skull base without stenosis. CTA HEAD Posterior circulation: Dominant left V4 segment. Normal left PICA origin. Diminutive right V4 but continues to the vertebrobasilar junction. Right AICA appears dominant. No significant distal vertebral or basilar stenosis. Patent SCA origins. Fetal type bilateral PCA origins. Bilateral PCA branches are within normal limits. Anterior circulation: Both ICA siphons are patent. Moderate left siphon calcified plaque but only mild associated stenosis. Normal left ophthalmic and posterior communicating artery origin. Right siphon similar moderate calcified plaque with no significant stenosis. Normal right ophthalmic and posterior communicating artery origins. Patent carotid termini, MCA and ACA origins. Normal anterior communicating artery. Bilateral ACA branches are within normal limits. Left MCA M1 segment and bifurcation are patent without stenosis. Left MCA branches are within normal limits. Right MCA M1 segment is patent. Right MCA bifurcation is suspected on series 9, image 106 with a thrombosed posterior M2 branch (series 9, image 106 there. However, other proximal right MCA branches appear patent and within normal limits. But there is a paucity of posterior right MCA M3/M4 branches. Venous sinuses: Early contrast timing, not well evaluated. Anatomic variants: Dominant left vertebral artery and fetal type PCA origins. Review of the MIP images confirms the above findings IMPRESSION: 1. 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 No hemorrhage or mass effect. 2. Evidence of thrombosed posterior Right MCA M2 branch (series 9, image 106) at the bifurcation with a paucity of Right  MCA M3/M4 branches. Right M1 patent without stenosis. 3. But mild for age underlying atherosclerosis in the head and neck. Some origin stenosis of the non dominant and  diminutive right vertebral artery, but no other significant atherosclerotic stenosis. 4. Small chronic appearing bilateral cerebellar infarcts. 5. Aortic Atherosclerosis (ICD10-I70.0). Study discussed by telephone with Dr. Marda Stalker on 08/26/2021 at 11:42 . Electronically Signed   By: Genevie Ann M.D.   On: 08/26/2021 11:49   CT Angio Neck W and/or Wo Contrast  Result Date: 08/26/2021 CLINICAL DATA:  81 year old male with left side neurologic deficit, left side numbness and neglect, left visual field cut. EXAM: CT HEAD WITHOUT CONTRAST CT ANGIOGRAPHY HEAD AND NECK TECHNIQUE: Multidetector noncontrast head CT, CTA imaging of the head and neck was performed using the standard protocol during bolus administration of intravenous contrast. Multiplanar CT image reconstructions and MIPs were obtained to evaluate the vascular anatomy. Carotid stenosis measurements (when applicable) are obtained utilizing NASCET criteria, using the distal internal carotid diameter as the denominator. RADIATION DOSE REDUCTION: This exam was performed according to the departmental dose-optimization program which includes automated exposure control, adjustment of the mA and/or kV according to patient size and/or use of iterative reconstruction technique. CONTRAST:  42mL OMNIPAQUE IOHEXOL 350 MG/ML SOLN COMPARISON:  Brain MRI 06/22/2009. Head CT 06/22/2009. CTA chest 08/18/2019. FINDINGS: CT HEAD Brain: Small right cerebellar infarct on series 4, image 9 is likely chronic given small size and conspicuity. Probable similar chronic left cerebellar infarct image 7. Patchy bilateral white matter hypodensity. Cytotoxic edema in the posterior right MCA territory most apparent on series 4, image 21. Right basal ganglia and insula appear spared. No other areas of cytotoxic edema  identified. ASPECTS 9 (abnormal M5 or M6). No acute intracranial hemorrhage identified. No intracranial mass effect or ventriculomegaly. Calvarium and skull base: No acute osseous abnormality identified. 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 Tympanic cavities and mastoids are well aerated. Orbits: Postoperative changes to both globes since 2010. No acute orbit or scalp soft tissue finding. CTA NECK Skeleton: Absent maxillary dentition. 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 No acute osseous abnormality identified. Upper chest: Negative. Other neck: Superficial dystrophic calcification 8 mm lateral to the right submandibular gland, significance doubtful. No acute neck soft tissue finding identified. Aortic arch: Calcified aortic atherosclerosis. 3 vessel arch configuration. Right carotid system: Minimal brachiocephalic and right CCA origin plaque without stenosis. Mild plaque at the right ICA origin and medial bulb without stenosis. Left carotid system: Mildly tortuous left CCA. Minimal plaque, no stenosis. Vertebral arteries: Minimal plaque in the proximal right subclavian artery. Non dominant, highly diminutive appearing proximal right vertebral artery with atherosclerosis at its origin resulting in mild to moderate stenosis, and some obscuration due to regional paravertebral venous contrast reflux. But the right V2 segment is patent. The vessel remains diminutive but patent to the skull base. Soft and calcified plaque in the proximal left subclavian artery without significant stenosis. Normal left vertebral artery origin. Dominant appearing left vertebral artery with tortuous V1 segment. Patent left vertebral to the skull base without stenosis. CTA HEAD Posterior circulation: Dominant left V4 segment. Normal left PICA origin. Diminutive right V4 but continues to the vertebrobasilar junction. Right AICA appears dominant. No  significant distal vertebral or basilar stenosis. Patent SCA origins. Fetal type bilateral PCA origins. Bilateral PCA branches are within normal limits. Anterior circulation: Both ICA siphons are patent. Moderate left siphon calcified plaque but only mild associated stenosis. Normal left ophthalmic and posterior communicating artery origin. Right siphon similar moderate calcified plaque with no significant stenosis. Normal right ophthalmic and posterior communicating artery origins. Patent carotid termini,  MCA and ACA origins. Normal anterior communicating artery. Bilateral ACA branches are within normal limits. Left MCA M1 segment and bifurcation are patent without stenosis. Left MCA branches are within normal limits. Right MCA M1 segment is patent. Right MCA bifurcation is suspected on series 9, image 106 with a thrombosed posterior M2 branch (series 9, image 106 there. However, other proximal right MCA branches appear patent and within normal limits. But there is a paucity of posterior right MCA M3/M4 branches. Venous sinuses: Early contrast timing, not well evaluated. Anatomic variants: Dominant left vertebral artery and fetal type PCA origins. Review of the MIP images confirms the above findings IMPRESSION: 1. 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 No hemorrhage or mass effect. 2. Evidence of thrombosed posterior Right MCA M2 branch (series 9, image 106) at the bifurcation with a paucity of Right MCA M3/M4 branches. Right M1 patent without stenosis. 3. But mild for age underlying atherosclerosis in the head and neck. Some origin stenosis of the non dominant and diminutive right vertebral artery, but no other significant atherosclerotic stenosis. 4. Small chronic appearing bilateral cerebellar infarcts. 5. Aortic Atherosclerosis (ICD10-I70.0). Study discussed by telephone with Dr. Marda Stalker on 08/26/2021 at 11:42 . Electronically Signed   By: Genevie Ann M.D.   On:  08/26/2021 11:49   MR BRAIN WO CONTRAST  Result Date: 08/26/2021 CLINICAL DATA:  Provided history: Neuro deficit, acute, stroke suspected. EXAM: MRI HEAD WITHOUT CONTRAST TECHNIQUE: Multiplanar, multiecho pulse sequences of the brain and surrounding structures were obtained without intravenous contrast. COMPARISON:  Noncontrast head CT and CT angiogram head/neck 08/26/2021. Brain MRI 06/22/2009. FINDINGS: Brain: Mild generalized cerebral and cerebellar atrophy. 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 Separate punctate acute infarct within the right parietal lobe (series 5, image 82). 8 mm acute/early subacute infarct within the left centrum semiovale (series 5, image 88). Additional subcentimeter acute/early subacute infarct within the left parietal lobe periventricular white matter (series 5, image 84). Background moderate multifocal T2 FLAIR hyperintense signal abnormality within the cerebral white matter, nonspecific but compatible chronic small vessel ischemic disease. Mild chronic small vessel ischemic changes are also present within the pons. There are small chronic infarcts within the bilateral cerebellar hemispheres which are new from the prior exam. Partially empty sella turcica. No evidence of an intracranial mass. No chronic intracranial blood products. No extra-axial fluid collection. No midline shift. Vascular: The intracranial arterial vasculature was better characterized on the CTA head/neck performed earlier today. Skull and upper cervical spine: No focal suspicious marrow lesion. Sinuses/Orbits: Visualized orbits show no acute finding. Bilateral lens replacements. Trace mucosal thickening within the bilateral frontal sinuses. Near complete opacification of the bilateral ethmoid sinuses secondary to the presence of mucosal thickening and fluid. Mild mucosal thickening  small volume frothy secretions within the left sphenoid sinus. Mild mucosal thickening within the right sphenoid sinus. Mild mucosal thickening within the bilateral maxillary sinuses. IMPRESSION: 1. 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 2. Separate punctate acute infarct within the right parietal lobe. 3. 8 mm acute/early subacute infarct within the left centrum semiovale. 4. Subcentimeter acute/early subacute infarct within the left parietal lobe periventricular white matter. 5. Involvement of multiple vascular territories raises the possibility of an embolic process. 6. Background moderate chronic small vessel ischemic changes within the cerebral white matter, and to a lesser  degree within the pons, progressed from the prior brain MRI of 06/22/2009. 7. 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 8. Mild generalized parenchymal atrophy. 9. Paranasal sinus disease, as described. Electronically Signed   By: Kellie Simmering D.O.   On: 08/26/2021 17:03     PHYSICAL EXAM Temp:  [97.5 F (36.4 C)-98.5 F (36.9 C)] 97.9 F (36.6 C) (02/05 0746) Pulse Rate:  [64-92] 81 (02/05 0746) Resp:  [16-21] 20 (02/05 0746) BP: (95-163)/(59-105) 124/81 (02/05 0746) SpO2:  [80 %-99 %] 99 % (02/05 0746) Weight:  [74.8 kg] 74.8 kg (02/04 1006)  General - Well nourished, well developed, elderly male in no apparent distress.  Cardiovascular - Regular rhythm and rate by palpation.  Mental Status -  Level of arousal and orientation to time, place, and person were intact. Language including expression, naming, repetition, comprehension was assessed and found largely intact. Patient with some delays that may be age related or by difficulty hearing Attention span and concentration were normal. Recent and remote memory were intact. Fund of  Knowledge was assessed and was intact.  Cranial Nerves II - XII - II - Visual field intact OU. III, IV, VI - Extraocular movements intact. V - Facial sensation intact bilaterally. VII - Patient with mild L facial droop. VIII - Hearing & vestibular largely intact bilaterally. X - Palate elevates symmetrically. XI - Chin turning & shoulder shrug intact bilaterally. XII - Tongue protrusion intact.  Motor Strength - The patients strength was normal in all extremities and pronator drift was absent.  Bulk was normal and fasciculations were absent.   Motor Tone - Muscle tone was assessed at the neck and appendages and was normal.  Sensory - Light touch, pain were assessed and R>L.    Coordination - The patient had normal movements in the hands and feet with no ataxia or dysmetria.  Tremor was absent.  Gait and Station - deferred.    ASSESSMENT/PLAN Lucas Fuller is a 81 y.o. male with history of HTN, prior stroke with residual L sided deficits, paroxysmal A fib, and CHF presenting with numbness of R hand and coordination difficulties with inability to dress himself.   Stroke:  right > left MCA infarcts likely secondary due to hx of PAF not on AC. Code Stroke CT head with acute to subacute R MCA infarct. ASPECTS 9 CTA head & neck Thrombosed post. R MCA M2; mild atherosclerosis in head and neck. Small chronic bilateral cerebellar infarcts. MRI:  R MCA infarct, punctate acute infarct in R parietal lobe. Acute/subacute infarct in L centrum semiovale and L parietal periventricular white matter. Mild generalized parenchymal atrophy 2D Echo pending LDL 71 HgbA1c 4.8 VTE prophylaxis - eliquis aspirin 81 mg daily prior to admission, now on Eliquis (apixaban) 5mg  bid. SW to work on affordability.  Therapy recommendations:  HH PT  Disposition:  home soon  Paroxysmal Atrial Fibrillation Was on eliquis before but off due to cost Continue Eliquis 5 mg BID SW to kindly assist with  affordability options  Hypertension Home meds:  Lisinopril 2.5 mg, Coreg 6.25 mg BID Stable Long-term BP goal normotensive  Hyperlipidemia Home meds:  N/A LDL 71, goal < 70 Continue Atorvastatin 20 mg  High intensity statin not indicated as advanced age and LDL near goal Continue statin at discharge  Other Stroke Risk Factors Advanced Age >/= 68  Hx stroke/TIA Family hx stroke (sister) Coronary artery disease Hx of Migraines Congestive heart failure  Other  Active Problems    Hospital day # 0  Rosezetta Schlatter, MD Stroke Neurology- Neuro Psych Resident 08/27/2021 7:57 AM  ATTENDING NOTE: I reviewed above note and agree with the assessment and plan. Pt was seen and examined.   81 year old male with history of PAF not on AC, CHF, hypertension, bladder cancer admitted for left hand decree sensation, mild left facial droop, not functioning well at home.  CT showed right MCA moderate-sized stroke, left CR small infarct.  CT head and neck right M2 occlusion.  LDL 71, A1c 4.8, creatinine 1.00.  EF 30-35%, same as in 12/2019.  On exam, patient awake alert, mild psychomotor slowing, however orientated x3.  No aphasia, follows some commands, able to name and repeat.  No visual field deficit or gaze palsy, left mild facial droop, tongue midline.  Bilateral upper and lower extremity equal strength, left upper and lower extremity decreased light touch sensation on exam although some noncooperation due to mild cognitive impairment.  Finger-to-nose bilaterally intact, gait not tested.  Etiology for patient embolic strokes likely due to PAF not on AC.  He was on Eliquis in the past but not able to continue due to cost.  Now, put him back on Eliquis 5 mg twice daily, so far tolerating well, social worker is working on the affordability.  Put on Lipitor 20.  PT/OT recommend home PT. Pt cardiomyopathy stable, recommend continue to follow up with Dr. Lovena Le on 09/12/21.  For detailed assessment and  plan, please refer to above as I have made changes wherever appropriate.   Neurology will sign off. Please call with questions. Pt will follow up with stroke clinic NP at [facility] in about 4 weeks. Thanks for the consult.  Rosalin Hawking, MD PhD Stroke Neurology 08/27/2021 12:28 PM   To contact Stroke Continuity provider, please refer to [email or web address] After hours, contact General Neurology

## 2021-08-27 NOTE — Evaluation (Signed)
Speech Language Pathology Evaluation Patient Details Name: Lucas Fuller MRN: [id number] DOB: 04/02/1941 Today's Date: 08/27/2021 Time: 6314-9702 SLP Time Calculation (min) (ACUTE ONLY): 21 min  Problem List:  Patient Active Problem List   Diagnosis Date Noted   Acute CVA (cerebrovascular accident) ([place]) 08/26/2021   Chronic combined systolic and diastolic CHF (congestive heart failure) ([place]) 08/26/2021   Essential hypertension 06/24/2018   Malnutrition of moderate degree 63/78/5885   Acute systolic CHF (congestive heart failure) ([place])    Cardiomyopathy ([place])    Hypothyroidism 12/05/2015   ETOH abuse 12/05/2015   Thrombocytopenia ([place]) 12/05/2015   Lumbar disc disease 12/05/2015   Paroxysmal atrial fibrillation (HCC)    Past Medical History:  Past Medical History:  Diagnosis Date   Acute diastolic congestive heart failure (HCC)    Acute systolic CHF (congestive heart failure) (HCC)    Atrial fibrillation, persistent (HCC)    Atrial flutter ([place]) 12/05/2015   Bladder cancer (HCC)    Cardiomyopathy (HCC)    CHF (congestive heart failure) ([place]) 0/27/7412   Chronic systolic CHF (congestive heart failure) (HCC)    Edema 12/05/2015   Elevated brain natriuretic peptide (BNP) level    Essential hypertension 06/24/2018   ETOH abuse 12/05/2015   History of blood transfusion    Hypothyroidism    Lumbar disc disease 12/05/2015   Malnutrition of moderate degree 12/07/2015   Migraine    "years ago" (05/17/2016)   Non-ischemic cardiomyopathy (HCC)    Paroxysmal atrial fibrillation ([place])    Pneumonia due to COVID-19 virus 08/18/2019   Protein calorie malnutrition ([place]) 12/05/2015   SOB (shortness of breath) 12/05/2015   Thrombocytopenia ([place]) 12/05/2015   Thyroid disease    Weight loss 12/05/2015   Past Surgical History:  Past Surgical History:  Procedure Laterality Date   CARDIAC CATHETERIZATION N/A 12/08/2015   Procedure: Left Heart Cath and Coronary Angiography;  Surgeon: Lorretta Harp, MD;  Location: [place] [facility];  Service: Cardiovascular;  Laterality: N/A;   CYSTOSCOPY WITH FULGERATION  2008   Archie Endo 11/23/2010   ELECTROPHYSIOLOGIC STUDY N/A 05/17/2016   Procedure: A-Flutter Ablation;  Surgeon: Evans Lance, MD;  Location: [place] [facility];  Service: Cardiovascular;  Laterality: N/A;   TONSILLECTOMY     TRANSURETHRAL RESECTION OF BLADDER  2008   /notes 11/23/2010   HPI:  Pt is an 81 y.o. male who presented 08/26/21 with L hand sensation changes. MRI brain: moderate-sized acute cortical/subcortical posterior right MCA territory infarct within the right parietal lobe, right temporoparietal junction and posterior right insula, separate punctate acute infarct within the right parietal lobe, acute/early subacute infarct within the left centrum  semiovale, and acute/early subacute infarct within the left  parietal lobe periventricular white matter. Pt outside of the window for TNK. PMH: PAF, CHF, HTN, hx of bladder cancer, hypothyroidism   Assessment / Plan / Recommendation Clinical Impression  Pt participated in speech/language/cognition evaluation. He reported that he has a tenth-grade education and lives with his roommate. Pt stated that he has some difficulty with "comprehension" at baseline, but he denied any acute changes in speech, language, or cognition. Motor speech and language skills were [facility] Mental Status Examination was completed to evaluate the pt's cognitive-linguistic skills. He achieved a score of 16/30 which is below the normal limits of 25 or more out of 30. He exhibited difficulty in the areas of attention, memory, complex problem solving, and executive function. Considering pt's level of independence at baseline, SLP questions  whether this is, indeed, his baseline. This was discussed with the pt, and he verbalized agreement with continued SLP services to target cognitive-linguistic function.    SLP Assessment  SLP  Recommendation/Assessment: Patient needs continued Speech [place] [facility] SLP Visit Diagnosis: Cognitive communication deficit (R41.841)    Recommendations for follow up therapy are one component of a multi-disciplinary discharge planning process, led by the attending physician.  Recommendations may be updated based on patient status, additional functional criteria and insurance authorization.    Follow Up Recommendations  Home health SLP    Assistance Recommended at Discharge  Intermittent Supervision/Assistance  Functional Status Assessment Patient has had a recent decline in their functional status and demonstrates the ability to make significant improvements in function in a reasonable and predictable amount of time.  Frequency and Duration min 2x/week  2 weeks      SLP Evaluation Cognition  Overall Cognitive Status: No family/caregiver present to determine baseline cognitive functioning Arousal/Alertness: Awake/alert Orientation Level: Oriented X4 Year: 2023 Month: February Day of Week: Correct Attention: Focused;Sustained Focused Attention: Impaired Focused Attention Impairment: Verbal complex Sustained Attention: Impaired Sustained Attention Impairment: Verbal complex Memory: Impaired Memory Impairment:  (Immediate: 5/5 with five repetitions; delayed: 4/5 with cue: 1/1) Awareness: Impaired Awareness Impairment: Emergent impairment Problem Solving: Impaired Problem Solving Impairment: Verbal complex (Money problems: 1/3) Executive Function: Sequencing;Organizing;Self Correcting Sequencing: Impaired Sequencing Impairment: Verbal complex (clock drawing: 0/4) Organizing: Impaired Organizing Impairment: Verbal complex (backward digit span: 1/2) Self Correcting: Appears intact       Comprehension  Auditory Comprehension Overall Auditory Comprehension: Appears within functional limits for tasks assessed Yes/No Questions: Within Functional Limits Commands:  Within Functional Limits Conversation: Complex    Expression Expression Primary Mode of Expression: Verbal Verbal Expression Initiation: No impairment Level of Generative/Spontaneous Verbalization: Conversation Repetition: No impairment Naming: No impairment Pragmatics: Impairment   Oral / Motor  Oral Motor/Sensory Function Overall Oral Motor/Sensory Function: Within functional limits Motor Speech Overall Motor Speech: Appears within functional limits for tasks assessed Respiration: Within functional limits Phonation: Normal Resonance: Within functional limits Articulation: Within functional limitis Intelligibility: Intelligible Motor Planning: Witnin functional limits Motor Speech Errors: Not applicable           Dennys Traughber I. Hardin Negus, [place], [place] Office number [phone] Pager [place] 08/27/2021, 3:08 PM

## 2021-08-27 NOTE — TOC CAGE-AID Note (Signed)
Transition of Care [facility]) - CAGE-AID Screening   Patient Details  Name: Lucas Fuller MRN: [id number] Date of Birth: [birth year]  [facility] CM/SW Contact:    Milinda Antis, [place] Phone Number: 08/27/2021, 11:54 AM   Clinical Narrative: CSW received consult for SA assessment and met with the patient at bedside.  The patient reported that he drinks  "half a pint" of alcohol over "2 days".  He declined resources and reported that he is able to stop drinking on his own as he "does not drink much".    CAGE-AID Screening:    Have You Ever Felt You Ought to Cut Down on Your Drinking or Drug Use?: No Have People Annoyed You By Critizing Your Drinking Or Drug Use?: Yes Have You Felt Bad Or Guilty About Your Drinking Or Drug Use?: No Have You Ever Had a Drink or Used Drugs First Thing In The Morning to Steady Your Nerves or to Get Rid of a Hangover?: No CAGE-AID Score: 1  Substance Abuse Education Offered: Yes

## 2021-08-29 DIAGNOSIS — I69354 Hemiplegia and hemiparesis following cerebral infarction affecting left non-dominant side: Secondary | ICD-10-CM | POA: Diagnosis not present

## 2021-08-29 DIAGNOSIS — E785 Hyperlipidemia, unspecified: Secondary | ICD-10-CM | POA: Diagnosis not present

## 2021-08-29 DIAGNOSIS — I110 Hypertensive heart disease with heart failure: Secondary | ICD-10-CM | POA: Diagnosis not present

## 2021-08-29 DIAGNOSIS — E440 Moderate protein-calorie malnutrition: Secondary | ICD-10-CM | POA: Diagnosis not present

## 2021-08-29 DIAGNOSIS — M5136 Other intervertebral disc degeneration, lumbar region: Secondary | ICD-10-CM | POA: Diagnosis not present

## 2021-08-29 DIAGNOSIS — G43909 Migraine, unspecified, not intractable, without status migrainosus: Secondary | ICD-10-CM | POA: Diagnosis not present

## 2021-08-29 DIAGNOSIS — I5042 Chronic combined systolic (congestive) and diastolic (congestive) heart failure: Secondary | ICD-10-CM | POA: Diagnosis not present

## 2021-08-29 DIAGNOSIS — I480 Paroxysmal atrial fibrillation: Secondary | ICD-10-CM | POA: Diagnosis not present

## 2021-08-29 DIAGNOSIS — I700 Atherosclerosis of aorta: Secondary | ICD-10-CM | POA: Diagnosis not present

## 2021-08-29 DIAGNOSIS — Z9181 History of falling: Secondary | ICD-10-CM | POA: Diagnosis not present

## 2021-08-29 DIAGNOSIS — I428 Other cardiomyopathies: Secondary | ICD-10-CM | POA: Diagnosis not present

## 2021-08-29 DIAGNOSIS — E039 Hypothyroidism, unspecified: Secondary | ICD-10-CM | POA: Diagnosis not present

## 2021-08-29 DIAGNOSIS — Z7901 Long term (current) use of anticoagulants: Secondary | ICD-10-CM | POA: Diagnosis not present

## 2021-08-30 DIAGNOSIS — I700 Atherosclerosis of aorta: Secondary | ICD-10-CM | POA: Diagnosis not present

## 2021-08-30 DIAGNOSIS — E039 Hypothyroidism, unspecified: Secondary | ICD-10-CM | POA: Diagnosis not present

## 2021-08-30 DIAGNOSIS — D696 Thrombocytopenia, unspecified: Secondary | ICD-10-CM | POA: Diagnosis not present

## 2021-08-30 DIAGNOSIS — G8194 Hemiplegia, unspecified affecting left nondominant side: Secondary | ICD-10-CM | POA: Diagnosis not present

## 2021-08-30 DIAGNOSIS — I6930 Unspecified sequelae of cerebral infarction: Secondary | ICD-10-CM | POA: Diagnosis not present

## 2021-08-30 DIAGNOSIS — I5022 Chronic systolic (congestive) heart failure: Secondary | ICD-10-CM | POA: Diagnosis not present

## 2021-08-30 DIAGNOSIS — I480 Paroxysmal atrial fibrillation: Secondary | ICD-10-CM | POA: Diagnosis not present

## 2021-09-01 ENCOUNTER — Encounter: Payer: Self-pay | Admitting: Neurology

## 2021-09-04 DIAGNOSIS — M5136 Other intervertebral disc degeneration, lumbar region: Secondary | ICD-10-CM | POA: Diagnosis not present

## 2021-09-04 DIAGNOSIS — G43909 Migraine, unspecified, not intractable, without status migrainosus: Secondary | ICD-10-CM | POA: Diagnosis not present

## 2021-09-04 DIAGNOSIS — I110 Hypertensive heart disease with heart failure: Secondary | ICD-10-CM | POA: Diagnosis not present

## 2021-09-04 DIAGNOSIS — I700 Atherosclerosis of aorta: Secondary | ICD-10-CM | POA: Diagnosis not present

## 2021-09-04 DIAGNOSIS — I428 Other cardiomyopathies: Secondary | ICD-10-CM | POA: Diagnosis not present

## 2021-09-04 DIAGNOSIS — Z7901 Long term (current) use of anticoagulants: Secondary | ICD-10-CM | POA: Diagnosis not present

## 2021-09-04 DIAGNOSIS — I5042 Chronic combined systolic (congestive) and diastolic (congestive) heart failure: Secondary | ICD-10-CM | POA: Diagnosis not present

## 2021-09-04 DIAGNOSIS — E039 Hypothyroidism, unspecified: Secondary | ICD-10-CM | POA: Diagnosis not present

## 2021-09-04 DIAGNOSIS — I480 Paroxysmal atrial fibrillation: Secondary | ICD-10-CM | POA: Diagnosis not present

## 2021-09-04 DIAGNOSIS — Z9181 History of falling: Secondary | ICD-10-CM | POA: Diagnosis not present

## 2021-09-04 DIAGNOSIS — I69354 Hemiplegia and hemiparesis following cerebral infarction affecting left non-dominant side: Secondary | ICD-10-CM | POA: Diagnosis not present

## 2021-09-04 DIAGNOSIS — E440 Moderate protein-calorie malnutrition: Secondary | ICD-10-CM | POA: Diagnosis not present

## 2021-09-04 DIAGNOSIS — E785 Hyperlipidemia, unspecified: Secondary | ICD-10-CM | POA: Diagnosis not present

## 2021-09-11 DIAGNOSIS — I428 Other cardiomyopathies: Secondary | ICD-10-CM | POA: Diagnosis not present

## 2021-09-11 DIAGNOSIS — I480 Paroxysmal atrial fibrillation: Secondary | ICD-10-CM | POA: Diagnosis not present

## 2021-09-11 DIAGNOSIS — E039 Hypothyroidism, unspecified: Secondary | ICD-10-CM | POA: Diagnosis not present

## 2021-09-11 DIAGNOSIS — M5136 Other intervertebral disc degeneration, lumbar region: Secondary | ICD-10-CM | POA: Diagnosis not present

## 2021-09-11 DIAGNOSIS — I5042 Chronic combined systolic (congestive) and diastolic (congestive) heart failure: Secondary | ICD-10-CM | POA: Diagnosis not present

## 2021-09-11 DIAGNOSIS — E440 Moderate protein-calorie malnutrition: Secondary | ICD-10-CM | POA: Diagnosis not present

## 2021-09-11 DIAGNOSIS — G43909 Migraine, unspecified, not intractable, without status migrainosus: Secondary | ICD-10-CM | POA: Diagnosis not present

## 2021-09-11 DIAGNOSIS — I69354 Hemiplegia and hemiparesis following cerebral infarction affecting left non-dominant side: Secondary | ICD-10-CM | POA: Diagnosis not present

## 2021-09-11 DIAGNOSIS — E785 Hyperlipidemia, unspecified: Secondary | ICD-10-CM | POA: Diagnosis not present

## 2021-09-11 DIAGNOSIS — Z9181 History of falling: Secondary | ICD-10-CM | POA: Diagnosis not present

## 2021-09-11 DIAGNOSIS — I110 Hypertensive heart disease with heart failure: Secondary | ICD-10-CM | POA: Diagnosis not present

## 2021-09-11 DIAGNOSIS — I700 Atherosclerosis of aorta: Secondary | ICD-10-CM | POA: Diagnosis not present

## 2021-09-11 DIAGNOSIS — Z7901 Long term (current) use of anticoagulants: Secondary | ICD-10-CM | POA: Diagnosis not present

## 2021-09-12 ENCOUNTER — Ambulatory Visit: Payer: Medicare Other | Admitting: Internal Medicine

## 2021-09-12 ENCOUNTER — Encounter: Payer: Self-pay | Admitting: Internal Medicine

## 2021-09-12 ENCOUNTER — Other Ambulatory Visit: Payer: Self-pay

## 2021-09-12 VITALS — BP 132/84 | HR 68 | Ht 67.0 in | Wt 160.0 lb

## 2021-09-12 DIAGNOSIS — I5042 Chronic combined systolic (congestive) and diastolic (congestive) heart failure: Secondary | ICD-10-CM

## 2021-09-12 DIAGNOSIS — I480 Paroxysmal atrial fibrillation: Secondary | ICD-10-CM | POA: Diagnosis not present

## 2021-09-12 NOTE — Progress Notes (Signed)
HPI Lucas Fuller returns today for ongoing evaluation and management of paroxysmal atrial fibrillation, nonischemic cardiomyopathy, chronic systolic heart failure.  The patient has a history of orthostatic syncope which occurred last year and has not recurred.  When I saw him he was on fairly good medical therapy.  Repeat echocardiography has demonstrated a reduction in his left ventricular systolic function to 16%. He had a stroke off of anti-coagulation.  Allergies  Allergen Reactions   Asa [Aspirin] Hives    Pt can tolerate 81 mg with no issues     Current Outpatient Medications  Medication Sig Dispense Refill   acetaminophen (TYLENOL) 500 MG tablet Take 1,000 mg by mouth every 6 (six) hours as needed for moderate pain or mild pain.     albuterol (VENTOLIN HFA) 108 (90 Base) MCG/ACT inhaler Inhale 2 puffs into the lungs every 4 (four) hours as needed for wheezing or shortness of breath. 18 g 0   apixaban (ELIQUIS) 5 MG TABS tablet Take 1 tablet (5 mg total) by mouth 2 (two) times daily. 60 tablet 0   atorvastatin (LIPITOR) 20 MG tablet Take 1 tablet (20 mg total) by mouth daily. 30 tablet 0   carvedilol (COREG) 3.125 MG tablet Take 1 tablet by mouth in the morning and at bedtime.     furosemide (LASIX) 40 MG tablet Take 1 tablet (40 mg total) by mouth daily. 30 tablet 11   levothyroxine (SYNTHROID, LEVOTHROID) 50 MCG tablet Take 50 mcg by mouth daily before breakfast.     lisinopril (ZESTRIL) 2.5 MG tablet Take 2.5 mg by mouth daily.     potassium chloride SA (KLOR-CON M20) 20 MEQ tablet Take 1 tablet (20 mEq total) by mouth daily. 30 tablet 6   No current facility-administered medications for this visit.     Past Medical History:  Diagnosis Date   Acute diastolic congestive heart failure (HCC)    Acute systolic CHF (congestive heart failure) (HCC)    Atrial fibrillation, persistent (HCC)    Atrial flutter ([place]) 12/05/2015   Bladder cancer (HCC)    Cardiomyopathy (HCC)     CHF (congestive heart failure) ([place]) 08/01/6043   Chronic systolic CHF (congestive heart failure) (HCC)    Edema 12/05/2015   Elevated brain natriuretic peptide (BNP) level    Essential hypertension 06/24/2018   ETOH abuse 12/05/2015   History of blood transfusion    Hypothyroidism    Lumbar disc disease 12/05/2015   Malnutrition of moderate degree 12/07/2015   Migraine    "years ago" (05/17/2016)   Non-ischemic cardiomyopathy (HCC)    Paroxysmal atrial fibrillation ([place])    Pneumonia due to COVID-19 virus 08/18/2019   Protein calorie malnutrition ([place]) 12/05/2015   SOB (shortness of breath) 12/05/2015   Thrombocytopenia ([place]) 12/05/2015   Thyroid disease    Weight loss 12/05/2015    ROS:   All systems reviewed and negative except as noted in the HPI.   Past Surgical History:  Procedure Laterality Date   CARDIAC CATHETERIZATION N/A 12/08/2015   Procedure: Left Heart Cath and Coronary Angiography;  Surgeon: Lorretta Harp, MD;  Location: [place] [facility];  Service: Cardiovascular;  Laterality: N/A;   CYSTOSCOPY WITH FULGERATION  2008   Archie Endo 11/23/2010   ELECTROPHYSIOLOGIC STUDY N/A 05/17/2016   Procedure: A-Flutter Ablation;  Surgeon: Evans Lance, MD;  Location: [place] [facility];  Service: Cardiovascular;  Laterality: N/A;   TONSILLECTOMY     TRANSURETHRAL RESECTION OF BLADDER  2008   /  notes 11/23/2010     Family History  Problem Relation Age of Onset   Heart attack Father 35   Kidney disease Sister    Hypertension Sister    Lung cancer Sister    CVA Sister    Diabetes Sister      Social History   Socioeconomic History   Marital status: Divorced    Spouse name: Not on file   Number of children: Not on file   Years of education: Not on file   Highest education level: Not on file  Occupational History   Not on file  Tobacco Use   Smoking status: Never   Smokeless tobacco: Never  Vaping Use   Vaping Use: Never used  Substance and Sexual Activity   Alcohol  use: Yes    Alcohol/week: 6.0 standard drinks    Types: 6 Cans of beer per week    Comment: 05/17/2016 "stopped drinking ~ 09/2015"   Drug use: No   Sexual activity: Not Currently  Other Topics Concern   Not on file  Social History Narrative   Not on file   Social Determinants of Health   Financial Resource Strain: Not on file  Food Insecurity: Not on file  Transportation Needs: Not on file  Physical Activity: Not on file  Stress: Not on file  Social Connections: Not on file  Intimate Partner Violence: Not on file     BP 132/84    Pulse 68    Ht 5\' 7"  (1.702 m)    Wt 160 lb (72.6 kg)    BMI 25.06 kg/m   Physical Exam:  Well appearing NAD HEENT: Unremarkable Neck:  No JVD, no thyromegally Lymphatics:  No adenopathy Back:  No CVA tenderness Lungs:  Clear with no wheezes HEART:  Regular rate rhythm, no murmurs, no rubs, no clicks Abd:  soft, positive bowel sounds, no organomegally, no rebound, no guarding Ext:  2 plus pulses, no edema, no cyanosis, no clubbing Skin:  No rashes no nodules Neuro:  CN II through XII intact, motor grossly intact   Assess/Plan:  1.  Chronic systolic heart failure -his symptoms are class II.  His EF is 35%.  He is on guideline directed medical therapy.  2.  Paroxysmal atrial fibrillation -he is maintaining sinus rhythm.  I have discussed the importance of systemic anti-coagulation and he is willing to proceed.  3.  Syncope -none since his last visit. 4. Stroke -he has had a nice recovery. His hand strength is back to normal  [employer]

## 2021-09-12 NOTE — Patient Instructions (Addendum)
Medication Instructions:  °Your physician recommends that you continue on your current medications as directed. Please refer to the Current Medication list given to you today. ° °Labwork: °None ordered. ° °Testing/Procedures: °None ordered. ° °Follow-Up: °Your physician wants you to follow-up in: 6 months with Gregg Taylor, MD  ° ° °Any Other Special Instructions Will Be Listed Below (If Applicable). ° °If you need a refill on your cardiac medications before your next appointment, please call your pharmacy.  ° ° ° ° °

## 2021-09-13 DIAGNOSIS — G43909 Migraine, unspecified, not intractable, without status migrainosus: Secondary | ICD-10-CM | POA: Diagnosis not present

## 2021-09-13 DIAGNOSIS — E039 Hypothyroidism, unspecified: Secondary | ICD-10-CM | POA: Diagnosis not present

## 2021-09-13 DIAGNOSIS — I700 Atherosclerosis of aorta: Secondary | ICD-10-CM | POA: Diagnosis not present

## 2021-09-13 DIAGNOSIS — I428 Other cardiomyopathies: Secondary | ICD-10-CM | POA: Diagnosis not present

## 2021-09-13 DIAGNOSIS — E785 Hyperlipidemia, unspecified: Secondary | ICD-10-CM | POA: Diagnosis not present

## 2021-09-13 DIAGNOSIS — I480 Paroxysmal atrial fibrillation: Secondary | ICD-10-CM | POA: Diagnosis not present

## 2021-09-13 DIAGNOSIS — M5136 Other intervertebral disc degeneration, lumbar region: Secondary | ICD-10-CM | POA: Diagnosis not present

## 2021-09-13 DIAGNOSIS — I110 Hypertensive heart disease with heart failure: Secondary | ICD-10-CM | POA: Diagnosis not present

## 2021-09-13 DIAGNOSIS — I69354 Hemiplegia and hemiparesis following cerebral infarction affecting left non-dominant side: Secondary | ICD-10-CM | POA: Diagnosis not present

## 2021-09-13 DIAGNOSIS — Z9181 History of falling: Secondary | ICD-10-CM | POA: Diagnosis not present

## 2021-09-13 DIAGNOSIS — Z7901 Long term (current) use of anticoagulants: Secondary | ICD-10-CM | POA: Diagnosis not present

## 2021-09-13 DIAGNOSIS — E440 Moderate protein-calorie malnutrition: Secondary | ICD-10-CM | POA: Diagnosis not present

## 2021-09-13 DIAGNOSIS — I5042 Chronic combined systolic (congestive) and diastolic (congestive) heart failure: Secondary | ICD-10-CM | POA: Diagnosis not present

## 2021-09-18 DIAGNOSIS — I428 Other cardiomyopathies: Secondary | ICD-10-CM | POA: Diagnosis not present

## 2021-09-18 DIAGNOSIS — I700 Atherosclerosis of aorta: Secondary | ICD-10-CM | POA: Diagnosis not present

## 2021-09-18 DIAGNOSIS — Z7901 Long term (current) use of anticoagulants: Secondary | ICD-10-CM | POA: Diagnosis not present

## 2021-09-18 DIAGNOSIS — M5136 Other intervertebral disc degeneration, lumbar region: Secondary | ICD-10-CM | POA: Diagnosis not present

## 2021-09-18 DIAGNOSIS — Z9181 History of falling: Secondary | ICD-10-CM | POA: Diagnosis not present

## 2021-09-18 DIAGNOSIS — I110 Hypertensive heart disease with heart failure: Secondary | ICD-10-CM | POA: Diagnosis not present

## 2021-09-18 DIAGNOSIS — E785 Hyperlipidemia, unspecified: Secondary | ICD-10-CM | POA: Diagnosis not present

## 2021-09-18 DIAGNOSIS — E440 Moderate protein-calorie malnutrition: Secondary | ICD-10-CM | POA: Diagnosis not present

## 2021-09-18 DIAGNOSIS — I5042 Chronic combined systolic (congestive) and diastolic (congestive) heart failure: Secondary | ICD-10-CM | POA: Diagnosis not present

## 2021-09-18 DIAGNOSIS — I480 Paroxysmal atrial fibrillation: Secondary | ICD-10-CM | POA: Diagnosis not present

## 2021-09-18 DIAGNOSIS — G43909 Migraine, unspecified, not intractable, without status migrainosus: Secondary | ICD-10-CM | POA: Diagnosis not present

## 2021-09-18 DIAGNOSIS — E039 Hypothyroidism, unspecified: Secondary | ICD-10-CM | POA: Diagnosis not present

## 2021-09-18 DIAGNOSIS — I69354 Hemiplegia and hemiparesis following cerebral infarction affecting left non-dominant side: Secondary | ICD-10-CM | POA: Diagnosis not present

## 2021-09-19 DIAGNOSIS — I428 Other cardiomyopathies: Secondary | ICD-10-CM | POA: Diagnosis not present

## 2021-09-19 DIAGNOSIS — I69354 Hemiplegia and hemiparesis following cerebral infarction affecting left non-dominant side: Secondary | ICD-10-CM | POA: Diagnosis not present

## 2021-09-19 DIAGNOSIS — I110 Hypertensive heart disease with heart failure: Secondary | ICD-10-CM | POA: Diagnosis not present

## 2021-09-19 DIAGNOSIS — I700 Atherosclerosis of aorta: Secondary | ICD-10-CM | POA: Diagnosis not present

## 2021-09-19 DIAGNOSIS — G43909 Migraine, unspecified, not intractable, without status migrainosus: Secondary | ICD-10-CM | POA: Diagnosis not present

## 2021-09-19 DIAGNOSIS — I480 Paroxysmal atrial fibrillation: Secondary | ICD-10-CM | POA: Diagnosis not present

## 2021-09-19 DIAGNOSIS — Z9181 History of falling: Secondary | ICD-10-CM | POA: Diagnosis not present

## 2021-09-19 DIAGNOSIS — E440 Moderate protein-calorie malnutrition: Secondary | ICD-10-CM | POA: Diagnosis not present

## 2021-09-19 DIAGNOSIS — E039 Hypothyroidism, unspecified: Secondary | ICD-10-CM | POA: Diagnosis not present

## 2021-09-19 DIAGNOSIS — Z7901 Long term (current) use of anticoagulants: Secondary | ICD-10-CM | POA: Diagnosis not present

## 2021-09-19 DIAGNOSIS — E785 Hyperlipidemia, unspecified: Secondary | ICD-10-CM | POA: Diagnosis not present

## 2021-09-19 DIAGNOSIS — I5042 Chronic combined systolic (congestive) and diastolic (congestive) heart failure: Secondary | ICD-10-CM | POA: Diagnosis not present

## 2021-09-19 DIAGNOSIS — M5136 Other intervertebral disc degeneration, lumbar region: Secondary | ICD-10-CM | POA: Diagnosis not present

## 2021-09-25 DIAGNOSIS — I700 Atherosclerosis of aorta: Secondary | ICD-10-CM | POA: Diagnosis not present

## 2021-09-25 DIAGNOSIS — Z7901 Long term (current) use of anticoagulants: Secondary | ICD-10-CM | POA: Diagnosis not present

## 2021-09-25 DIAGNOSIS — E039 Hypothyroidism, unspecified: Secondary | ICD-10-CM | POA: Diagnosis not present

## 2021-09-25 DIAGNOSIS — I480 Paroxysmal atrial fibrillation: Secondary | ICD-10-CM | POA: Diagnosis not present

## 2021-09-25 DIAGNOSIS — Z9181 History of falling: Secondary | ICD-10-CM | POA: Diagnosis not present

## 2021-09-25 DIAGNOSIS — E785 Hyperlipidemia, unspecified: Secondary | ICD-10-CM | POA: Diagnosis not present

## 2021-09-25 DIAGNOSIS — M5136 Other intervertebral disc degeneration, lumbar region: Secondary | ICD-10-CM | POA: Diagnosis not present

## 2021-09-25 DIAGNOSIS — I5042 Chronic combined systolic (congestive) and diastolic (congestive) heart failure: Secondary | ICD-10-CM | POA: Diagnosis not present

## 2021-09-25 DIAGNOSIS — I110 Hypertensive heart disease with heart failure: Secondary | ICD-10-CM | POA: Diagnosis not present

## 2021-09-25 DIAGNOSIS — G43909 Migraine, unspecified, not intractable, without status migrainosus: Secondary | ICD-10-CM | POA: Diagnosis not present

## 2021-09-25 DIAGNOSIS — E440 Moderate protein-calorie malnutrition: Secondary | ICD-10-CM | POA: Diagnosis not present

## 2021-09-25 DIAGNOSIS — I69354 Hemiplegia and hemiparesis following cerebral infarction affecting left non-dominant side: Secondary | ICD-10-CM | POA: Diagnosis not present

## 2021-09-25 DIAGNOSIS — I428 Other cardiomyopathies: Secondary | ICD-10-CM | POA: Diagnosis not present

## 2021-10-02 DIAGNOSIS — I69354 Hemiplegia and hemiparesis following cerebral infarction affecting left non-dominant side: Secondary | ICD-10-CM | POA: Diagnosis not present

## 2021-10-02 DIAGNOSIS — Z7901 Long term (current) use of anticoagulants: Secondary | ICD-10-CM | POA: Diagnosis not present

## 2021-10-02 DIAGNOSIS — G43909 Migraine, unspecified, not intractable, without status migrainosus: Secondary | ICD-10-CM | POA: Diagnosis not present

## 2021-10-02 DIAGNOSIS — I700 Atherosclerosis of aorta: Secondary | ICD-10-CM | POA: Diagnosis not present

## 2021-10-02 DIAGNOSIS — I110 Hypertensive heart disease with heart failure: Secondary | ICD-10-CM | POA: Diagnosis not present

## 2021-10-02 DIAGNOSIS — M5136 Other intervertebral disc degeneration, lumbar region: Secondary | ICD-10-CM | POA: Diagnosis not present

## 2021-10-02 DIAGNOSIS — I480 Paroxysmal atrial fibrillation: Secondary | ICD-10-CM | POA: Diagnosis not present

## 2021-10-02 DIAGNOSIS — I5042 Chronic combined systolic (congestive) and diastolic (congestive) heart failure: Secondary | ICD-10-CM | POA: Diagnosis not present

## 2021-10-02 DIAGNOSIS — E785 Hyperlipidemia, unspecified: Secondary | ICD-10-CM | POA: Diagnosis not present

## 2021-10-02 DIAGNOSIS — E440 Moderate protein-calorie malnutrition: Secondary | ICD-10-CM | POA: Diagnosis not present

## 2021-10-02 DIAGNOSIS — Z9181 History of falling: Secondary | ICD-10-CM | POA: Diagnosis not present

## 2021-10-02 DIAGNOSIS — E039 Hypothyroidism, unspecified: Secondary | ICD-10-CM | POA: Diagnosis not present

## 2021-10-02 DIAGNOSIS — I428 Other cardiomyopathies: Secondary | ICD-10-CM | POA: Diagnosis not present

## 2021-10-06 NOTE — Progress Notes (Signed)
? ?NEUROLOGY CONSULTATION NOTE ? ?Lucas Fuller ?MRN: [id number] ?DOB: 1940-12-09 ? ?Referring provider: Wenda Low, MD ?Primary care provider: Wenda Low, MD ? ?Reason for consult:  stroke ? ?Assessment/Plan:  ? ?Right greater than left MCA territory infarcts, secondary to paroxysmal atrial fibrillation off anticoagulation ?Paroxysmal atrial fibrillation ?Hypertension ?Hyperlipidemia ? ? ?1  Secondary stroke prevention as managed by cardiology/PCP: ? - Eliquis ? - It appears that he is no longer on atorvastatin.  Consider restarting.  LDL goal less than 70 ? - Normotensive blood pressure ? - Hgb A1c goal less than 7 ?2  Mediterranean diet ?3  Routine exercise ?4  Follow up 6 months. ? ? ?Subjective:  ?Lucas Fuller is an 81 year old right-handed male with HTN, paroxysmal atrial fib, CHF and residual left sided deficits due to prior stroke who presents for recent stroke.  History supplemented by hospital records. CT head, CTA head and neck and MRI brain personally reviewed. ? ?Admitted for stroke at [facility] on 08/26/2021, presenting with right hand numbness and incoordination.  He had trouble using his hands to dress himself.  CT head showed acute to subacute right MCA infarct.  Follow up MRI of brain showed right MCA infarct, punctate acute infarct in right parietal lobe, acute/subacute infarct in left centrum semiovale and left parietal periventricular white matter as well as chronic bilateral cerebellar infarcts.  CTA head and neck showed thrombosed posterior right MCA M2 segment, otherwise mild atherosclerosis in the head and neck.  2D echocardiogram showed LVEF 30-35%, grade II diastolic dysfunction, mild MR, mild-mod ASand no atrial level shunt.  LDL was 71.  Hgb A1c was 4.8.  He was on ASA prior to admission.  Has history of PAF but not taking Eliquis due to cost.  He was discharged on Eliquis and continued on atorvastatin '20mg'$ , lisinopril, Coreg.  He had some home PT which has since  finished.  He is now using a walker to assist ambulating.  He says he sprained his right ankle which hurts when he bears weight on it.  Has a podiatry appointment next week.  ?  ? ? ?PAST MEDICAL HISTORY: ?Past Medical History:  ?Diagnosis Date  ? Acute diastolic congestive heart failure ([place])   ? Acute systolic CHF (congestive heart failure) ([place])   ? Atrial fibrillation, persistent ([place])   ? Atrial flutter ([place]) 12/05/2015  ? Bladder cancer ([place])   ? Cardiomyopathy ([place])   ? CHF (congestive heart failure) ([place]) 12/05/2015  ? Chronic systolic CHF (congestive heart failure) ([place])   ? Edema 12/05/2015  ? Elevated brain natriuretic peptide (BNP) level   ? Essential hypertension 06/24/2018  ? ETOH abuse 12/05/2015  ? History of blood transfusion   ? Hypothyroidism   ? Lumbar disc disease 12/05/2015  ? Malnutrition of moderate degree 12/07/2015  ? Migraine   ? "years ago" (05/17/2016)  ? Non-ischemic cardiomyopathy ([place])   ? Paroxysmal atrial fibrillation (HCC)   ? Pneumonia due to COVID-19 virus 08/18/2019  ? Protein calorie malnutrition ([place]) 12/05/2015  ? SOB (shortness of breath) 12/05/2015  ? Thrombocytopenia ([place]) 12/05/2015  ? Thyroid disease   ? Weight loss 12/05/2015  ? ? ?PAST SURGICAL HISTORY: ?Past Surgical History:  ?Procedure Laterality Date  ? CARDIAC CATHETERIZATION N/A 12/08/2015  ? Procedure: Left Heart Cath and Coronary Angiography;  Surgeon: Lorretta Harp, MD;  Location: [place] [facility];  Service: Cardiovascular;  Laterality: N/A;  ? CYSTOSCOPY WITH FULGERATION  2008  ? /notes 11/23/2010  ?  ELECTROPHYSIOLOGIC STUDY N/A 05/17/2016  ? Procedure: A-Flutter Ablation;  Surgeon: Evans Lance, MD;  Location: [place] [facility];  Service: Cardiovascular;  Laterality: N/A;  ? TONSILLECTOMY    ? TRANSURETHRAL RESECTION OF BLADDER  2008  ? Archie Endo 11/23/2010  ? ? ?MEDICATIONS: ?Current Outpatient Medications on File Prior to Visit  ?Medication Sig Dispense Refill  ? acetaminophen (TYLENOL) 500 MG tablet Take 1,000 mg by  mouth every 6 (six) hours as needed for moderate pain or mild pain.    ? albuterol (VENTOLIN HFA) 108 (90 Base) MCG/ACT inhaler Inhale 2 puffs into the lungs every 4 (four) hours as needed for wheezing or shortness of breath. 18 g 0  ? apixaban (ELIQUIS) 5 MG TABS tablet Take 1 tablet (5 mg total) by mouth 2 (two) times daily. 60 tablet 0  ? atorvastatin (LIPITOR) 20 MG tablet Take 1 tablet (20 mg total) by mouth daily. 30 tablet 0  ? carvedilol (COREG) 3.125 MG tablet Take 1 tablet by mouth in the morning and at bedtime.    ? furosemide (LASIX) 40 MG tablet Take 1 tablet (40 mg total) by mouth daily. 30 tablet 11  ? levothyroxine (SYNTHROID, LEVOTHROID) 50 MCG tablet Take 50 mcg by mouth daily before breakfast.    ? lisinopril (ZESTRIL) 2.5 MG tablet Take 2.5 mg by mouth daily.    ? potassium chloride SA (KLOR-CON M20) 20 MEQ tablet Take 1 tablet (20 mEq total) by mouth daily. 30 tablet 6  ? ?No current facility-administered medications on file prior to visit.  ? ? ?ALLERGIES: ?Allergies  ?Allergen Reactions  ? Asa [Aspirin] Hives  ?  Pt can tolerate 81 mg with no issues  ? ? ?FAMILY HISTORY: ?Family History  ?Problem Relation Age of Onset  ? Heart attack Father 29  ? Kidney disease Sister   ? Hypertension Sister   ? Lung cancer Sister   ? CVA Sister   ? Diabetes Sister   ? ? ?Objective:  ?Blood pressure 121/78, pulse 66, height '5\' 7"'$  (1.702 m), weight 154 lb (69.9 kg), SpO2 97 %. ?General: No acute distress.  Patient appears well-groomed.   ?Head:  Normocephalic/atraumatic ?Eyes:  fundi examined but not visualized ?Heart: regular rate and rhythm ?Vascular: No carotid bruits. ?Neurological Exam: ?Mental status: alert and oriented to person, place, and time, recent and remote memory intact, fund of knowledge intact, attention and concentration intact, speech fluent and not dysarthric, language intact. ?Cranial nerves: ?CN I: not tested ?CN II: pupils equal, round and reactive to light, visual fields intact ?CN III,  IV, VI:  full range of motion, no nystagmus, no ptosis ?CN V: facial sensation intact. ?CN VII: upper and lower face symmetric ?CN VIII: hearing intact ?CN IX, X: gag intact, uvula midline ?CN XI: sternocleidomastoid and trapezius muscles intact ?CN XII: tongue midline ?Bulk & Tone: normal, no fasciculations. ?Motor:  muscle strength 5/5 throughout ?Sensation:  Temperature sensation reduced in right upper extremity, vibratory sensation intact. ?Deep Tendon Reflexes:  2+ throughout,  toes downgoing.   ?Finger to nose testing:  Without dysmetria.   ?Heel to shin:  Without dysmetria.   ?Gait:  Cautious, antalgic gait.  Uses walker.  Romberg negative. ? ? ? ?Thank you for allowing me to take part in the care of this patient. ? ?Metta Clines, DO ? ?CC: Wenda Low, MD ? ? ? ? ? ?

## 2021-10-09 ENCOUNTER — Other Ambulatory Visit: Payer: Self-pay

## 2021-10-09 ENCOUNTER — Ambulatory Visit: Payer: Medicare Other | Admitting: Neurology

## 2021-10-09 ENCOUNTER — Encounter: Payer: Self-pay | Admitting: Neurology

## 2021-10-09 VITALS — BP 121/78 | HR 66 | Ht 67.0 in | Wt 154.0 lb

## 2021-10-09 DIAGNOSIS — I480 Paroxysmal atrial fibrillation: Secondary | ICD-10-CM

## 2021-10-09 DIAGNOSIS — E440 Moderate protein-calorie malnutrition: Secondary | ICD-10-CM | POA: Diagnosis not present

## 2021-10-09 DIAGNOSIS — I69354 Hemiplegia and hemiparesis following cerebral infarction affecting left non-dominant side: Secondary | ICD-10-CM | POA: Diagnosis not present

## 2021-10-09 DIAGNOSIS — M5136 Other intervertebral disc degeneration, lumbar region: Secondary | ICD-10-CM | POA: Diagnosis not present

## 2021-10-09 DIAGNOSIS — G43909 Migraine, unspecified, not intractable, without status migrainosus: Secondary | ICD-10-CM | POA: Diagnosis not present

## 2021-10-09 DIAGNOSIS — E785 Hyperlipidemia, unspecified: Secondary | ICD-10-CM | POA: Diagnosis not present

## 2021-10-09 DIAGNOSIS — I428 Other cardiomyopathies: Secondary | ICD-10-CM | POA: Diagnosis not present

## 2021-10-09 DIAGNOSIS — I63413 Cerebral infarction due to embolism of bilateral middle cerebral arteries: Secondary | ICD-10-CM

## 2021-10-09 DIAGNOSIS — I5042 Chronic combined systolic (congestive) and diastolic (congestive) heart failure: Secondary | ICD-10-CM | POA: Diagnosis not present

## 2021-10-09 DIAGNOSIS — I110 Hypertensive heart disease with heart failure: Secondary | ICD-10-CM | POA: Diagnosis not present

## 2021-10-09 DIAGNOSIS — Z9181 History of falling: Secondary | ICD-10-CM | POA: Diagnosis not present

## 2021-10-09 DIAGNOSIS — E039 Hypothyroidism, unspecified: Secondary | ICD-10-CM | POA: Diagnosis not present

## 2021-10-09 DIAGNOSIS — I700 Atherosclerosis of aorta: Secondary | ICD-10-CM | POA: Diagnosis not present

## 2021-10-09 DIAGNOSIS — Z7901 Long term (current) use of anticoagulants: Secondary | ICD-10-CM | POA: Diagnosis not present

## 2021-10-09 DIAGNOSIS — I10 Essential (primary) hypertension: Secondary | ICD-10-CM | POA: Diagnosis not present

## 2021-10-09 NOTE — Patient Instructions (Signed)
Take the Eliquis as prescribed ?Take your blood pressure medications as prescribed ?I am going to recommend continuing the atorvastatin (cholesterol medication) ?Mediterranean diet (see below) ?Routine exercise ?Follow up 6 months ? ? ? ?Mediterranean Diet ?A Mediterranean diet refers to food and lifestyle choices that are based on the traditions of countries located on the [employer]. It focuses on eating more fruits, vegetables, whole grains, beans, nuts, seeds, and heart-healthy fats, and eating less dairy, meat, eggs, and processed foods with added sugar, salt, and fat. This way of eating has been shown to help prevent certain conditions and improve outcomes for people who have chronic diseases, like kidney disease and heart disease. ?What are tips for following this plan? ?Reading food labels ?Check the serving size of packaged foods. For foods such as rice and pasta, the serving size refers to the amount of cooked product, not dry. ?Check the total fat in packaged foods. Avoid foods that have saturated fat or trans fats. ?Check the ingredient list for added sugars, such as corn syrup. ?Shopping ? ?Buy a variety of foods that offer a balanced diet, including: ?Fresh fruits and vegetables (produce). ?Grains, beans, nuts, and seeds. Some of these may be available in unpackaged forms or large amounts (in bulk). ?Fresh seafood. ?Poultry and eggs. ?Low-fat dairy products. ?Buy whole ingredients instead of prepackaged foods. ?Buy fresh fruits and vegetables in-season from local farmers markets. ?Buy plain frozen fruits and vegetables. ?If you do not have access to quality fresh seafood, buy precooked frozen shrimp or canned fish, such as tuna, salmon, or sardines. ?Stock your pantry so you always have certain foods on hand, such as olive oil, canned tuna, canned tomatoes, rice, pasta, and beans. ?Cooking ?Cook foods with extra-virgin olive oil instead of using butter or other vegetable oils. ?Have meat as a  side dish, and have vegetables or grains as your main dish. This means having meat in small portions or adding small amounts of meat to foods like pasta or stew. ?Use beans or vegetables instead of meat in common dishes like chili or lasagna. ?Experiment with different cooking methods. Try roasting, broiling, steaming, and saut?ing vegetables. ?Add frozen vegetables to soups, stews, pasta, or rice. ?Add nuts or seeds for added healthy fats and plant protein at each meal. You can add these to yogurt, salads, or vegetable dishes. ?Marinate fish or vegetables using olive oil, lemon juice, garlic, and fresh herbs. ?Meal planning ?Plan to eat one vegetarian meal one day each week. Try to work up to two vegetarian meals, if possible. ?Eat seafood two or more times a week. ?Have healthy snacks readily available, such as: ?Vegetable sticks with hummus. ?[place] yogurt. ?Fruit and nut trail mix. ?Eat balanced meals throughout the week. This includes: ?Fruit: 2-3 servings a day. ?Vegetables: 4-5 servings a day. ?Low-fat dairy: 2 servings a day. ?Fish, poultry, or lean meat: 1 serving a day. ?Beans and legumes: 2 or more servings a week. ?Nuts and seeds: 1-2 servings a day. ?Whole grains: 6-8 servings a day. ?Extra-virgin olive oil: 3-4 servings a day. ?Limit red meat and sweets to only a few servings a month. ?Lifestyle ? ?Cook and eat meals together with your family, when possible. ?Drink enough fluid to keep your urine pale yellow. ?Be physically active every day. This includes: ?Aerobic exercise like running or swimming. ?Leisure activities like gardening, walking, or housework. ?Get 7-8 hours of sleep each night. ?If recommended by your health care provider, drink red wine in moderation. This means 1  glass a day for nonpregnant women and 2 glasses a day for men. A glass of wine equals 5 oz (150 mL). ?What foods should I eat? ?Fruits ?Apples. Apricots. Avocado. Berries. Bananas. Cherries. Dates. Figs. Grapes. Lemons. Melon.  Oranges. Peaches. Plums. Pomegranate. ?Vegetables ?Artichokes. Beets. Broccoli. Cabbage. Carrots. Eggplant. Green beans. Chard. Kale. Spinach. Onions. Leeks. Peas. Squash. Tomatoes. Peppers. Radishes. ?Grains ?Whole-grain pasta. Brown rice. Bulgur wheat. Polenta. Couscous. Whole-wheat bread. [place]. ?Meats and other proteins ?Beans. Almonds. Sunflower seeds. Pine nuts. Peanuts. [place]. Salmon. Scallops. Shrimp. [place]. Tilapia. Clams. Oysters. Eggs. Poultry without skin. ?Dairy ?Low-fat milk. Cheese. Greek yogurt. ?Fats and oils ?Extra-virgin olive oil. Avocado oil. Grapeseed oil. ?Beverages ?Water. Red wine. Herbal tea. ?Sweets and desserts ?Greek yogurt with honey. Baked apples. Poached pears. Trail mix. ?Seasonings and condiments ?Basil. Cilantro. Coriander. Cumin. Mint. Parsley. Sage. Rosemary. Tarragon. Garlic. Oregano. Thyme. Pepper. Balsamic vinegar. Tahini. Hummus. Tomato sauce. Olives. Mushrooms. ?The items listed above may not be a complete list of foods and beverages you can eat. Contact a dietitian for more information. ?What foods should I limit? ?This is a list of foods that should be eaten rarely or only on special occasions. ?Fruits ?Fruit canned in syrup. ?Vegetables ?Deep-fried potatoes (french fries). ?Grains ?Prepackaged pasta or rice dishes. Prepackaged cereal with added sugar. Prepackaged snacks with added sugar. ?Meats and other proteins ?Beef. Pork. Lamb. Poultry with skin. Hot dogs. Berniece Salines. ?Dairy ?Ice cream. Sour cream. Whole milk. ?Fats and oils ?Butter. Canola oil. Vegetable oil. Beef fat (tallow). Lard. ?Beverages ?Juice. Sugar-sweetened soft drinks. Beer. Liquor and spirits. ?Sweets and desserts ?Cookies. Cakes. Pies. Candy. ?Seasonings and condiments ?Mayonnaise. Pre-made sauces and marinades. ?The items listed above may not be a complete list of foods and beverages you should limit. Contact a dietitian for more information. ?Summary ?The Mediterranean diet includes both food and  lifestyle choices. ?Eat a variety of fresh fruits and vegetables, beans, nuts, seeds, and whole grains. ?Limit the amount of red meat and sweets that you eat. ?If recommended by your health care provider, drink red wine in moderation. This means 1 glass a day for nonpregnant women and 2 glasses a day for men. A glass of wine equals 5 oz (150 mL). ?This information is not intended to replace advice given to you by your health care provider. Make sure you discuss any questions you have with your health care provider. ?Document Revised: 08/14/2019 Document Reviewed: 06/11/2019 ?Elsevier Patient Education ? [place]. ? ?

## 2021-10-16 DIAGNOSIS — I428 Other cardiomyopathies: Secondary | ICD-10-CM | POA: Diagnosis not present

## 2021-10-16 DIAGNOSIS — Z9181 History of falling: Secondary | ICD-10-CM | POA: Diagnosis not present

## 2021-10-16 DIAGNOSIS — E039 Hypothyroidism, unspecified: Secondary | ICD-10-CM | POA: Diagnosis not present

## 2021-10-16 DIAGNOSIS — I69354 Hemiplegia and hemiparesis following cerebral infarction affecting left non-dominant side: Secondary | ICD-10-CM | POA: Diagnosis not present

## 2021-10-16 DIAGNOSIS — C672 Malignant neoplasm of lateral wall of bladder: Secondary | ICD-10-CM | POA: Diagnosis not present

## 2021-10-16 DIAGNOSIS — I700 Atherosclerosis of aorta: Secondary | ICD-10-CM | POA: Diagnosis not present

## 2021-10-16 DIAGNOSIS — Z7901 Long term (current) use of anticoagulants: Secondary | ICD-10-CM | POA: Diagnosis not present

## 2021-10-16 DIAGNOSIS — G43909 Migraine, unspecified, not intractable, without status migrainosus: Secondary | ICD-10-CM | POA: Diagnosis not present

## 2021-10-16 DIAGNOSIS — M5136 Other intervertebral disc degeneration, lumbar region: Secondary | ICD-10-CM | POA: Diagnosis not present

## 2021-10-16 DIAGNOSIS — E440 Moderate protein-calorie malnutrition: Secondary | ICD-10-CM | POA: Diagnosis not present

## 2021-10-16 DIAGNOSIS — E785 Hyperlipidemia, unspecified: Secondary | ICD-10-CM | POA: Diagnosis not present

## 2021-10-16 DIAGNOSIS — I110 Hypertensive heart disease with heart failure: Secondary | ICD-10-CM | POA: Diagnosis not present

## 2021-10-16 DIAGNOSIS — I480 Paroxysmal atrial fibrillation: Secondary | ICD-10-CM | POA: Diagnosis not present

## 2021-10-16 DIAGNOSIS — I5042 Chronic combined systolic (congestive) and diastolic (congestive) heart failure: Secondary | ICD-10-CM | POA: Diagnosis not present

## 2021-10-17 ENCOUNTER — Telehealth: Payer: Self-pay | Admitting: Internal Medicine

## 2021-10-17 NOTE — Telephone Encounter (Signed)
? ?  Pre-operative Risk Assessment  ?  ?Patient Name: Lucas Fuller  ?DOB: 12/06/1928 ?MRN: [id number]  ? ?  ? ?Request for Surgical Clearance   ? ?Procedure:  Transurethral resection of bladder tumor  ?Date of Surgery:  Clearance TBD                              ?   ?Surgeon:  Dr. Ellison Hughs ?Surgeon's Group or Practice Name:  [facility] ?Phone number:  [phone] ?Fax number:  [phone] ?  ?Type of Clearance Requested:   ?- Medical  ?- Pharmacy:  Hold Aspirin 5 days  ?  ?Type of Anesthesia:  General  ?  ?Additional requests/questions:   ? ?Signed, ?Johnna Acosta   ?10/17/2021, 10:21 AM   ?

## 2021-10-17 NOTE — Telephone Encounter (Signed)
Chart reviewed. Per our records and neuro records, patient is on Eliquis, will route to pharm for input then pt will need to be set up for tele call to clarify his anticoagulation as there has been some intermittent lapse in compliance in the past. Regarding ASA, this is likely something we will defer to neuro since he has normal coronaries by cath 2017. ?

## 2021-10-18 NOTE — Telephone Encounter (Signed)
Patient with diagnosis of atrial fibrillation on Eliquis for anticoagulation.   ? ?Procedure: transurethral resection of bladder tumor ?Date of procedure: TBD ? ? ?CHA2DS2-VASc Score = 6  ? This indicates a 9.7% annual risk of stroke. ?The patient's score is based upon: ?CHF History: 1 ?HTN History: 1 ?Diabetes History: 0 ?Stroke History: 2 ?Vascular Disease History: 0 ?Age Score: 2 ?Gender Score: 0 ? 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  ?No specific request for number of days to hold Eliquis.  Because of proximity to time of stroke, I would suggest Lovenox bridge, but will need to defer to Cardiologist for final determination.   ? ?CrCl 57 ?Platelet count Will need repeat CBC, last drawn did not have platelet count ? ? ? ?

## 2021-10-22 NOTE — Telephone Encounter (Signed)
I am ok with him stopping the eliquis for 2 days preop and restarting when [facility] with the surgical team. His stroke risk is still very low. GT ?

## 2021-10-23 DIAGNOSIS — M5136 Other intervertebral disc degeneration, lumbar region: Secondary | ICD-10-CM | POA: Diagnosis not present

## 2021-10-23 DIAGNOSIS — G43909 Migraine, unspecified, not intractable, without status migrainosus: Secondary | ICD-10-CM | POA: Diagnosis not present

## 2021-10-23 DIAGNOSIS — Z7901 Long term (current) use of anticoagulants: Secondary | ICD-10-CM | POA: Diagnosis not present

## 2021-10-23 DIAGNOSIS — E039 Hypothyroidism, unspecified: Secondary | ICD-10-CM | POA: Diagnosis not present

## 2021-10-23 DIAGNOSIS — I428 Other cardiomyopathies: Secondary | ICD-10-CM | POA: Diagnosis not present

## 2021-10-23 DIAGNOSIS — I700 Atherosclerosis of aorta: Secondary | ICD-10-CM | POA: Diagnosis not present

## 2021-10-23 DIAGNOSIS — E440 Moderate protein-calorie malnutrition: Secondary | ICD-10-CM | POA: Diagnosis not present

## 2021-10-23 DIAGNOSIS — E785 Hyperlipidemia, unspecified: Secondary | ICD-10-CM | POA: Diagnosis not present

## 2021-10-23 DIAGNOSIS — I5042 Chronic combined systolic (congestive) and diastolic (congestive) heart failure: Secondary | ICD-10-CM | POA: Diagnosis not present

## 2021-10-23 DIAGNOSIS — Z9181 History of falling: Secondary | ICD-10-CM | POA: Diagnosis not present

## 2021-10-23 DIAGNOSIS — I480 Paroxysmal atrial fibrillation: Secondary | ICD-10-CM | POA: Diagnosis not present

## 2021-10-23 DIAGNOSIS — I110 Hypertensive heart disease with heart failure: Secondary | ICD-10-CM | POA: Diagnosis not present

## 2021-10-23 DIAGNOSIS — I69354 Hemiplegia and hemiparesis following cerebral infarction affecting left non-dominant side: Secondary | ICD-10-CM | POA: Diagnosis not present

## 2021-10-25 ENCOUNTER — Telehealth: Payer: Self-pay | Admitting: *Deleted

## 2021-10-25 ENCOUNTER — Other Ambulatory Visit: Payer: Self-pay | Admitting: *Deleted

## 2021-10-25 MED ORDER — LISINOPRIL 2.5 MG PO TABS: 2.5000 mg | ORAL_TABLET | Freq: Every day | ORAL | 3 refills | Status: DC

## 2021-10-25 NOTE — Telephone Encounter (Signed)
? ?  Patient Name: Lucas Fuller  ?DOB: 01-05-1941 ?MRN: [id number] ? ?Primary Cardiologist: [name], MD ? ?Chart reviewed as part of pre-operative protocol coverage.  ? ?Per Dr. Lovena Le, okay to hold Eliquis for 2 days to procedure and restart as soon as possible postprocedure at the discretion of the surgeon. ? ?Preoperative team, please contact this patient and set up a phone call appointment for further cardiac evaluation.  Thank you for your help. ? ?Lenna Sciara, NP ?10/25/2021, 9:37 AM ? ? ?

## 2021-10-25 NOTE — Addendum Note (Signed)
Addended by: Michae Kava on: 10/25/2021 12:55 PM ? ? Modules accepted: Orders ? ?

## 2021-10-25 NOTE — Telephone Encounter (Signed)
Pt agreeable to plan of care for tele pre op appt. Med rec and consent are done. Pt states he has been out of his Lisinopril for about 3 days, refill has been sent in to [facility] per pt.  ? ?  ?Patient Consent for Virtual Visit  ? ? ?   ? ?Lucas Fuller has provided verbal consent on 10/25/2021 for a virtual visit (video or telephone). ? ? ?CONSENT FOR VIRTUAL VISIT FOR:  Lucas Fuller  ?By participating in this virtual visit I agree to the following: ? ?I hereby voluntarily request, consent and authorize [facility] and its employed or contracted physicians, physician assistants, nurse practitioners or other licensed health care professionals (the Practitioner), to provide me with telemedicine health care services (the ?Services") as deemed necessary by the treating Practitioner. I acknowledge and consent to receive the Services by the Practitioner via telemedicine. I understand that the telemedicine visit will involve communicating with the Practitioner through live audiovisual communication technology and the disclosure of certain medical information by electronic transmission. I acknowledge that I have been given the opportunity to request an in-person assessment or other available alternative prior to the telemedicine visit and am voluntarily participating in the telemedicine visit. ? ?I understand that I have the right to withhold or withdraw my consent to the use of telemedicine in the course of my care at any time, without affecting my right to future care or treatment, and that the Practitioner or I may terminate the telemedicine visit at any time. I understand that I have the right to inspect all information obtained and/or recorded in the course of the telemedicine visit and may receive copies of available information for a reasonable fee.  I understand that some of the potential risks of receiving the Services via telemedicine include:  ?Delay or interruption in medical evaluation due to  technological equipment failure or disruption; ?Information transmitted may not be sufficient (e.g. poor resolution of images) to allow for appropriate medical decision making by the Practitioner; and/or  ?In rare instances, security protocols could fail, causing a breach of personal health information. ? ?Furthermore, I acknowledge that it is my responsibility to provide information about my medical history, conditions and care that is complete and accurate to the best of my ability. I acknowledge that Practitioner's advice, recommendations, and/or decision may be based on factors not within their control, such as incomplete or inaccurate data provided by me or distortions of diagnostic images or specimens that may result from electronic transmissions. I understand that the practice of medicine is not an exact science and that Practitioner makes no warranties or guarantees regarding treatment outcomes. I acknowledge that a copy of this consent can be made available to me via my patient portal (Lucas Fuller), or I can request a printed copy by calling the office of Lucas Fuller.   ? ?I understand that my insurance will be billed for this visit.  ? ?I have read or had this consent read to me. ?I understand the contents of this consent, which adequately explains the benefits and risks of the Services being provided via telemedicine.  ?I have been provided ample opportunity to ask questions regarding this consent and the Services and have had my questions answered to my satisfaction. ?I give my informed consent for the services to be provided through the use of telemedicine in my medical care ? ? ? ?

## 2021-10-25 NOTE — Telephone Encounter (Signed)
Pt agreeable to plan of care for tele pre op appt. Med rec and consent are done. Pt states he has been out of his Lisinopril for about 3 days, [facility] has [facility] in [facility] per pt.  ?

## 2021-10-30 DIAGNOSIS — L84 Corns and callosities: Secondary | ICD-10-CM | POA: Diagnosis not present

## 2021-10-30 DIAGNOSIS — B351 Tinea unguium: Secondary | ICD-10-CM | POA: Diagnosis not present

## 2021-11-01 ENCOUNTER — Telehealth: Payer: Self-pay | Admitting: Internal Medicine

## 2021-11-01 NOTE — Telephone Encounter (Signed)
This has already been routed to preop callback pool ?

## 2021-11-01 NOTE — Telephone Encounter (Signed)
I s/w the pt's niece Lucas Fuller. Lucas Fuller was asking if she needed to be on the call 11/03/21 with the pt. I said that is up to her and the pt. She tells me she is not sure why the call is being done as the procedure has been postponed for 3 months from the time the pt had a stroke 08/26/21. I assured her that we have that all noted about the stroke 08/26/21. I stated if postponed from time of stroke 08/26/21, that would indicate that Dr. Lovena Neighbours is probably looking at doing procedure sometime in May or after. I assured her the tele visit will be to follow up on how is [name] doing and if anything changes. I assured Lucas Fuller that we will not clear the pt if we feel that he is not able to have the procedure. I assured her that our provider will make the best informed decision for the pt. Lucas Fuller, thanked me for the call back today.   ?

## 2021-11-01 NOTE — Telephone Encounter (Signed)
I s/w the pt's niece Alen Blew. Hassan Rowan was asking if she needed to be on the call 11/03/21 with the pt. I said that is up to her and the pt. She tells me she is not sure why the call is being done as the procedure has been postponed for 3 months from the time the pt had a stroke 08/26/21. I assured her that we have that all noted about the stroke 08/26/21. I stated if postponed from time of stroke 08/26/21, that would indicate that Dr. Lovena Neighbours is probably looking at doing procedure sometime in May or after. I assured her the tele visit will be to follow up on how is Mr. Bartko doing and if anything changes. I assured Hassan Rowan that we will not clear the pt if we feel that he is not able to have the procedure. I assured her that our provider will make the best informed decision for the pt. Hassan Rowan, thanked me for the call back today.   ?

## 2021-11-01 NOTE — Telephone Encounter (Signed)
Pt's niece called and wants to know if someone could give her a call to let her know if she needs to be present for pt's Telephone Visit on 11/03/21. She states that pt has a hard time understanding. Please advise ?

## 2021-11-03 ENCOUNTER — Ambulatory Visit (INDEPENDENT_AMBULATORY_CARE_PROVIDER_SITE_OTHER): Payer: Medicare Other | Admitting: Physician Assistant

## 2021-11-03 ENCOUNTER — Encounter: Payer: Self-pay | Admitting: Physician Assistant

## 2021-11-03 DIAGNOSIS — Z01810 Encounter for preprocedural cardiovascular examination: Secondary | ICD-10-CM

## 2021-11-03 NOTE — Progress Notes (Signed)
? ?Virtual Visit via Telephone Note  ? ?This visit type was conducted due to national recommendations for restrictions regarding the COVID-19 Pandemic (e.g. social distancing) in an effort to limit this patient's exposure and mitigate transmission in our community.  Due to his co-morbid illnesses, this patient is at least at moderate risk for complications without adequate follow up.  This format is felt to be most appropriate for this patient at this time.  The patient did not have access to video technology/had technical difficulties with video requiring transitioning to audio format only (telephone).  All issues noted in this document were discussed and addressed.  No physical exam could be performed with this format.  Please refer to the patient's chart for his  consent to telehealth for [facility]. ? ?Evaluation Performed:  Preoperative cardiovascular risk assessment ?_____________  ? ?Date:  11/03/2021  ? ?Patient ID:  Lucas Fuller, Lucas Fuller Mar 04, 1941, MRN [id number] ?Patient Location:  ?Home ?Provider location:   ?Office ? ?Primary Care Provider:  Wenda Low, MD ?Primary Cardiologist:  [name], MD ? ?Chief Complaint  ?  ?81 y.o. y/o male with a h/o chronic systolic heart failure with LVEF 35%, NICM, PAF and stroke 08/26/21 while not on [place], and orthostatic syncope, who is pending Transurethral resection of bladder tumor, and presents today for telephonic preoperative cardiovascular risk assessment. ? ?Past Medical History  ?  ?Past Medical History:  ?Diagnosis Date  ? Acute diastolic congestive heart failure ([place])   ? Acute systolic CHF (congestive heart failure) ([place])   ? Atrial fibrillation, persistent ([place])   ? Atrial flutter ([place]) 12/05/2015  ? Bladder cancer ([place])   ? Cardiomyopathy ([place])   ? CHF (congestive heart failure) ([place]) 12/05/2015  ? Chronic systolic CHF (congestive heart failure) ([place])   ? Edema 12/05/2015  ? Elevated brain natriuretic peptide (BNP) level   ? Essential hypertension  06/24/2018  ? ETOH abuse 12/05/2015  ? History of blood transfusion   ? Hypothyroidism   ? Lumbar disc disease 12/05/2015  ? Malnutrition of moderate degree 12/07/2015  ? Migraine   ? "years ago" (05/17/2016)  ? Non-ischemic cardiomyopathy ([place])   ? Paroxysmal atrial fibrillation ([facility])   ? Pneumonia due to COVID-19 virus 08/18/2019  ? Protein calorie malnutrition ([place]) 12/05/2015  ? SOB (shortness of breath) 12/05/2015  ? Thrombocytopenia ([place]) 12/05/2015  ? Thyroid disease   ? Weight loss 12/05/2015  ? ?Past Surgical History:  ?Procedure Laterality Date  ? CARDIAC CATHETERIZATION N/A 12/08/2015  ? Procedure: Left Heart Cath and Coronary Angiography;  Surgeon: Lorretta Harp, MD;  Location: [facility];  Service: Cardiovascular;  Laterality: N/A;  ? CYSTOSCOPY WITH FULGERATION  2008  ? /notes 11/23/2010  ? ELECTROPHYSIOLOGIC STUDY N/A 05/17/2016  ? Procedure: A-Flutter Ablation;  Surgeon: Evans Lance, MD;  Location: [facility];  Service: Cardiovascular;  Laterality: N/A;  ? TONSILLECTOMY    ? TRANSURETHRAL RESECTION OF BLADDER  2008  ? Archie Endo 11/23/2010  ? ? ?Allergies ? ?Allergies  ?Allergen Reactions  ? Asa [Aspirin] Hives  ?  Pt can tolerate 81 mg with no issues  ? ? ?History of Present Illness  ?  ?Lucas Fuller is a 81 y.o. male who presents via audio/video conferencing for a telehealth visit today.  Pt was last seen in cardiology clinic on 09/12/21 by Dr. Lovena Le.  At that time Lucas Fuller was doing well and had regained strength in his hand.  The patient is now pending  Transurethral resection of bladder tumor.  Since his last visit, he has done well. Heart catheterization 2017 showed no CAD. Recent echocardiogram 08/2021 showed persistent LVEF 30-35%.  ? ?During my phone call, Lucas Fuller was able to help with conversation. The patient describes some worsening dyspnea, especially when he is at work. He denies orthopnea and lower extremity edema.  ? ? ?Home Medications  ?  ?Prior to Admission medications    ?Medication Sig Start Date End Date Taking? Authorizing Provider  ?acetaminophen (TYLENOL) 500 MG tablet Take 1,000 mg by mouth every 6 (six) hours as needed for moderate pain or mild pain.    [provider]  ?albuterol (VENTOLIN HFA) 108 (90 Base) MCG/ACT inhaler Inhale 2 puffs into the lungs every 4 (four) hours as needed for wheezing or shortness of breath. 08/22/19   Hosie Poisson, MD  ?apixaban (ELIQUIS) 5 MG TABS tablet Take 1 tablet (5 mg total) by mouth 2 (two) times daily. 08/27/21   Patrecia Pour, MD  ?carvedilol (COREG) 3.125 MG tablet Take 1 tablet by mouth in the morning and at bedtime.    [provider]  ?furosemide (LASIX) 40 MG tablet Take 1 tablet (40 mg total) by mouth daily. 12/18/19   Baldwin Jamaica, PA-C  ?levothyroxine (SYNTHROID, LEVOTHROID) 50 MCG tablet Take 50 mcg by mouth daily before breakfast.    [provider]  ?lisinopril (ZESTRIL) 2.5 MG tablet Take 1 tablet (2.5 mg total) by mouth daily. 10/25/21   Evans Lance, MD  ?potassium chloride SA (KLOR-CON M20) 20 MEQ tablet Take 1 tablet (20 mEq total) by mouth daily. 12/18/19   Baldwin Jamaica, PA-C  ? ? ?Physical Exam  ?  ?Vital Signs:  Lucas Fuller does not have vital signs available for review today. ? ?Given telephonic nature of communication, physical exam is limited. ?AAOx3. NAD. Normal affect.  Speech and respirations are unlabored. ? ?Accessory Clinical Findings  ?  ?None ? ?Assessment & Plan  ?  ?1.  Preoperative Cardiovascular Risk Assessment: ?Risk factors for MACE include CHF and stroke. No CAD by heart cath 2017.  He has persistent chronic systolic heart failure and is recovering from stroke. He describes worsening dyspnea on exertion, and some SOB at rest, no orthopnea or lower extremity edema. Given his age and co-morbid conditions, I have recommended an in-office visit for final recommendations. ? ? ?2. Guidance for holding oral anticoagulation ?Per our clinical pharmacist: ?Patient with  diagnosis of atrial fibrillation on Eliquis for anticoagulation.   ?  ?Procedure: transurethral resection of bladder tumor ?Date of procedure: TBD ?  ?  ?CHA2DS2-VASc Score = 6  ? This indicates a 9.7% annual risk of stroke. ?The patient's score is based upon: ?CHF History: 1 ?HTN History: 1 ?Diabetes History: 0 ?Stroke History: 2 ?Vascular Disease History: 0 ?Age Score: 2 ?Gender Score: 0 ?  ?Patient had embolic stroke 02/21/49, was not on anticoagulation at the time.  ?No specific request for number of days to hold Eliquis.  Because of proximity to time of stroke, I would suggest Lovenox bridge, but will need to defer to Cardiologist for final determination.   ?  ?CrCl 57 ?Platelet count Will need repeat CBC, last drawn did not have platelet count ? ?Per Dr. Lovena Le: ?I am ok with him stopping the eliquis for 2 days preop and restarting when [facility] with the surgical team. His stroke risk is still very low. ? ? ?A copy of this note will be routed to requesting  Psychologist, sport and exercise. ? ?Time:   ?Today, I have spent 10 minutes with the patient with telehealth technology discussing medical history, symptoms, and management plan.   ? ? ?Ledora Bottcher, PA ? ?11/03/2021, 8:38 AM ?

## 2021-11-03 NOTE — Progress Notes (Signed)
Per pre op provider Doreene Adas, PAC the pt will require an IN OFFICE appt for pre op clearance. I will send message to Dr. Tanna Furry scheduler [place] to reach out to the pt with an appt for pre op clearance. ?

## 2021-11-07 NOTE — Progress Notes (Signed)
Message sent to EP scheduler [place] to reach out to the pt with an appt for pre op clearance as well as pt c/o worsening sob.  ?

## 2021-11-07 NOTE — Progress Notes (Unsigned)
? ?Cardiology Office Note ?Date:  11/07/2021  ?Patient ID:  Lucas Fuller, [place] Oct 30, 1940, MRN [id number] ?PCP:  Wenda Low, MD  ?Cardiologist:  Dr. Angelena Form (2017 last) ?EP: Dr. Lovena Le ? ? ?  ?Chief Complaint:  *** ? Pre-op, worse SOB ? ?History of Present Illness: ?Lucas Fuller is a 81 y.o. male with history of atrial flutter ablation, nonischemic cardiomyopathy with LV dysfunction EF previously 20 to 25%.  Normal coronary arteries on cath in 2017 ?saw Dr. Lovena Le 01/28/2017 at which time the patient stopped his Eliquis and was continued on low-dose aspirin.  Repeat 2D echo 02/14/2017 LVEF improved to 40 to 45% with mild diffuse hypokinesis and grade 1 DD ?Hypothyroidism, chronic CHF (combined) ?ETOH abuse ?Bladder ca ?stroke ? ?Hospitalized 08/18/2019 COVID pneumonia.  Found in rate controlled AFib felt 2/2 acute illness, started on heparin ?Discharged 08/21/19 with home health ?In regards to AFib, notes report that the family declined a/c, mentioning h/o falls ? ? ?TTE June 2021  ?LVEF 30-35% (from 40-45%), started on low dose losartan w h/o historically only being able to tolerate low dose ACE. ?24hr monitor noted ? 1. NSR with sinus brady and sinus tachycardia ?2. occaisional PAC's and rare PVC's ?3. NS atrial tachycardia.  ?4. Nocturnal bradycardia ? ? ?08/26/21: TTE LVEF 30-35% ? ?He comes in today to be seen for Dr. Lovena Le, last seen by him 09/12/21, he was s/p stroke and agreeable to a/c.  He was in SR, at that time with class II symptoms. ? ? ?Needs pre-op assessment for TURBT ?Dr. Lovena Le has cleared eliquis hold for 2 day pre-op ?Pre-op pool reached out to the patient for phone visit, the pt reported increased SOB and advised he should come in for further evaluation ? ?*** symptoms, new? Worse SOB ?*** volume ?*** surgery in May? ?*** bleeding, eliquis, dose, labs ?*** CM meds ? ? ?RCRI score is 2 >> 6.6% risk ?Normal coronaries by cath 2017 ?Chronic CHF ? ? ? ?Past Medical History:  ?Diagnosis Date  ?  Acute diastolic congestive heart failure ([place])   ? Acute systolic CHF (congestive heart failure) ([place])   ? Atrial fibrillation, persistent ([place])   ? Atrial flutter ([place]) 12/05/2015  ? Bladder cancer ([place])   ? Cardiomyopathy ([place])   ? CHF (congestive heart failure) ([place]) 12/05/2015  ? Chronic systolic CHF (congestive heart failure) ([place])   ? Edema 12/05/2015  ? Elevated brain natriuretic peptide (BNP) level   ? Essential hypertension 06/24/2018  ? ETOH abuse 12/05/2015  ? History of blood transfusion   ? Hypothyroidism   ? Lumbar disc disease 12/05/2015  ? Malnutrition of moderate degree 12/07/2015  ? Migraine   ? "years ago" (05/17/2016)  ? Non-ischemic cardiomyopathy ([place])   ? Paroxysmal atrial fibrillation ([facility])   ? Pneumonia due to COVID-19 virus 08/18/2019  ? Protein calorie malnutrition ([place]) 12/05/2015  ? SOB (shortness of breath) 12/05/2015  ? Thrombocytopenia ([place]) 12/05/2015  ? Thyroid disease   ? Weight loss 12/05/2015  ? ? ?Past Surgical History:  ?Procedure Laterality Date  ? CARDIAC CATHETERIZATION N/A 12/08/2015  ? Procedure: Left Heart Cath and Coronary Angiography;  Surgeon: Lorretta Harp, MD;  Location: [facility];  Service: Cardiovascular;  Laterality: N/A;  ? CYSTOSCOPY WITH FULGERATION  2008  ? /notes 11/23/2010  ? ELECTROPHYSIOLOGIC STUDY N/A 05/17/2016  ? Procedure: A-Flutter Ablation;  Surgeon: Evans Lance, MD;  Location: [facility];  Service: Cardiovascular;  Laterality: N/A;  ? TONSILLECTOMY    ?  TRANSURETHRAL RESECTION OF BLADDER  2008  ? Archie Endo 11/23/2010  ? ? ?Current Outpatient Medications  ?Medication Sig Dispense Refill  ? acetaminophen (TYLENOL) 500 MG tablet Take 1,000 mg by mouth every 6 (six) hours as needed for moderate pain or mild pain.    ? albuterol (VENTOLIN HFA) 108 (90 Base) MCG/ACT inhaler Inhale 2 puffs into the lungs every 4 (four) hours as needed for wheezing or shortness of breath. 18 g 0  ? apixaban (ELIQUIS) 5 MG TABS tablet Take 1 tablet (5 mg total) by mouth 2  (two) times daily. 60 tablet 0  ? carvedilol (COREG) 3.125 MG tablet Take 1 tablet by mouth in the morning and at bedtime.    ? furosemide (LASIX) 40 MG tablet Take 1 tablet (40 mg total) by mouth daily. 30 tablet 11  ? levothyroxine (SYNTHROID, LEVOTHROID) 50 MCG tablet Take 50 mcg by mouth daily before breakfast.    ? lisinopril (ZESTRIL) 2.5 MG tablet Take 1 tablet (2.5 mg total) by mouth daily. 90 tablet 3  ? potassium chloride SA (KLOR-CON M20) 20 MEQ tablet Take 1 tablet (20 mEq total) by mouth daily. 30 tablet 6  ? ?No current facility-administered medications for this visit.  ? ? ?Allergies:   Asa [aspirin]  ? ?Social History:  The patient  reports that he has never smoked. He has never used smokeless tobacco. He reports current alcohol use of about 6.0 standard drinks per week. He reports that he does not use drugs.  ? ?Family History:  The patient's family history includes CVA in his sister; Diabetes in his sister; Heart attack (age of onset: 61) in his father; Hypertension in his sister; Kidney disease in his sister; Lung cancer in his sister. ? ?ROS:  Please see the history of present illness.    ?All other systems are reviewed and otherwise negative.  ? ?PHYSICAL EXAM:  ?VS:  There were no vitals taken for this visit. BMI: There is no height or weight on file to calculate BMI. ?Well nourished, well developed, in no acute distress  ?HEENT: normocephalic, atraumatic  ?Neck: no JVD, carotid bruits or masses ?Cardiac:  RRR; no significant murmurs, no rubs, or gallops ?Lungs:  CTA b/l, no wheezing, rhonchi or rales  ?Abd: soft, nontender ?MS: no deformity or atrophy ?Ext: mild edema to just above the ankle, he has been at work today on his feet most of the day as well,  No erythema, wounds or weeping ?Skin: warm and dry, no rash ?Neuro:  No gross deficits appreciated ?Psych: euthymic mood, full affect ? ? ?EKG:  done today and reviewed by myself ?*** ? ?08/27/21: TTE ? 1. EF similar to echo done 01/07/20 . Left  ventricular ejection fraction,  ?by estimation, is 30 to 35%. The left ventricle has moderately decreased  ?function. The left ventricle demonstrates global hypokinesis. The left  ?ventricular internal cavity size was  ? moderately dilated. There is mild left ventricular hypertrophy. Left  ?ventricular diastolic parameters are consistent with Grade II diastolic  ?dysfunction (pseudonormalization). Elevated left ventricular end-diastolic  ?pressure.  ? 2. Right ventricular systolic function is normal. The right ventricular  ?size is normal. There is mildly elevated pulmonary artery systolic  ?pressure.  ? 3. Left atrial size was moderately dilated.  ? 4. Right atrial size was mildly dilated.  ? 5. The mitral valve is abnormal. Mild mitral valve regurgitation. No  ?evidence of mitral stenosis.  ? 6. Calcified non coronary cusp. The aortic valve is tricuspid.  There is  ?moderate calcification of the aortic valve. There is moderate thickening  ?of the aortic valve. Aortic valve regurgitation is trivial. 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  ? 7. The inferior vena cava is normal in size with greater than 50%  ?respiratory variability, suggesting right atrial pressure of 3 mmHg.  ? ?01/13/2020: 24hr Monitor ?1. NSR with sinus brady and sinus tachycardia ?2. occaisional PAC's and rare PVC's ?3. NS atrial tachycardia.  ?4. Nocturnal bradycardia (minimum HR 45) ? ? ? ?01/07/2020: TTE ?IMPRESSIONS  ?1. Left ventricular ejection fraction, by estimation, is 30 to 35%. The  ?left ventricle has moderately decreased function. The left ventricle  ?demonstrates global hypokinesis. Left ventricular diastolic parameters are  ?consistent with Grade II diastolic  ?dysfunction (pseudonormalization). Elevated left atrial pressure. The  ?average left ventricular global longitudinal strain is -10.5 %. The global  ?longitudinal strain is normal.  ? 2. Right ventricular systolic function is mildly reduced. The right  ?ventricular size is  moderately enlarged. There is normal pulmonary artery  ?systolic pressure. The estimated right ventricular systolic pressure is  ?09.8 mmHg.  ? 3. Left atrial size was moderately dilated.  ? 4. Right atr

## 2021-11-08 ENCOUNTER — Inpatient Hospital Stay (HOSPITAL_COMMUNITY)
Admission: EM | Admit: 2021-11-08 | Discharge: 2021-11-10 | DRG: 291 | Disposition: A | Discharge status: Home / Self Care | Payer: Medicare Other | Source: Ambulatory Visit | Attending: Internal Medicine | Admitting: Internal Medicine

## 2021-11-08 ENCOUNTER — Other Ambulatory Visit: Payer: Self-pay

## 2021-11-08 ENCOUNTER — Emergency Department (HOSPITAL_COMMUNITY): Payer: Medicare Other

## 2021-11-08 ENCOUNTER — Encounter (HOSPITAL_COMMUNITY): Payer: Self-pay | Admitting: Emergency Medicine

## 2021-11-08 DIAGNOSIS — N179 Acute kidney failure, unspecified: Secondary | ICD-10-CM | POA: Diagnosis present

## 2021-11-08 DIAGNOSIS — Z8701 Personal history of pneumonia (recurrent): Secondary | ICD-10-CM

## 2021-11-08 DIAGNOSIS — I110 Hypertensive heart disease with heart failure: Principal | ICD-10-CM | POA: Diagnosis present

## 2021-11-08 DIAGNOSIS — Z8673 Personal history of transient ischemic attack (TIA), and cerebral infarction without residual deficits: Secondary | ICD-10-CM | POA: Diagnosis not present

## 2021-11-08 DIAGNOSIS — I5043 Acute on chronic combined systolic (congestive) and diastolic (congestive) heart failure: Secondary | ICD-10-CM | POA: Diagnosis not present

## 2021-11-08 DIAGNOSIS — Z886 Allergy status to analgesic agent status: Secondary | ICD-10-CM

## 2021-11-08 DIAGNOSIS — R0603 Acute respiratory distress: Secondary | ICD-10-CM | POA: Diagnosis not present

## 2021-11-08 DIAGNOSIS — E785 Hyperlipidemia, unspecified: Secondary | ICD-10-CM | POA: Diagnosis present

## 2021-11-08 DIAGNOSIS — Z801 Family history of malignant neoplasm of trachea, bronchus and lung: Secondary | ICD-10-CM

## 2021-11-08 DIAGNOSIS — I509 Heart failure, unspecified: Secondary | ICD-10-CM | POA: Diagnosis not present

## 2021-11-08 DIAGNOSIS — Z79899 Other long term (current) drug therapy: Secondary | ICD-10-CM

## 2021-11-08 DIAGNOSIS — Z79890 Hormone replacement therapy (postmenopausal): Secondary | ICD-10-CM

## 2021-11-08 DIAGNOSIS — Z8551 Personal history of malignant neoplasm of bladder: Secondary | ICD-10-CM

## 2021-11-08 DIAGNOSIS — E038 Other specified hypothyroidism: Secondary | ICD-10-CM

## 2021-11-08 DIAGNOSIS — Z833 Family history of diabetes mellitus: Secondary | ICD-10-CM | POA: Diagnosis not present

## 2021-11-08 DIAGNOSIS — Z841 Family history of disorders of kidney and ureter: Secondary | ICD-10-CM

## 2021-11-08 DIAGNOSIS — Z823 Family history of stroke: Secondary | ICD-10-CM | POA: Diagnosis not present

## 2021-11-08 DIAGNOSIS — Z8616 Personal history of COVID-19: Secondary | ICD-10-CM | POA: Diagnosis not present

## 2021-11-08 DIAGNOSIS — Z7901 Long term (current) use of anticoagulants: Secondary | ICD-10-CM

## 2021-11-08 DIAGNOSIS — I480 Paroxysmal atrial fibrillation: Secondary | ICD-10-CM | POA: Diagnosis not present

## 2021-11-08 DIAGNOSIS — Z8249 Family history of ischemic heart disease and other diseases of the circulatory system: Secondary | ICD-10-CM | POA: Diagnosis not present

## 2021-11-08 DIAGNOSIS — E039 Hypothyroidism, unspecified: Secondary | ICD-10-CM | POA: Diagnosis present

## 2021-11-08 DIAGNOSIS — I5021 Acute systolic (congestive) heart failure: Secondary | ICD-10-CM | POA: Diagnosis not present

## 2021-11-08 DIAGNOSIS — E7800 Pure hypercholesterolemia, unspecified: Secondary | ICD-10-CM | POA: Insufficient documentation

## 2021-11-08 DIAGNOSIS — Z743 Need for continuous supervision: Secondary | ICD-10-CM | POA: Diagnosis not present

## 2021-11-08 DIAGNOSIS — I10 Essential (primary) hypertension: Secondary | ICD-10-CM

## 2021-11-08 DIAGNOSIS — I517 Cardiomegaly: Secondary | ICD-10-CM | POA: Diagnosis not present

## 2021-11-08 DIAGNOSIS — R609 Edema, unspecified: Secondary | ICD-10-CM | POA: Diagnosis not present

## 2021-11-08 DIAGNOSIS — R0602 Shortness of breath: Secondary | ICD-10-CM | POA: Diagnosis not present

## 2021-11-08 LAB — BASIC METABOLIC PANEL
Anion gap: 9 (ref 5–15)
BUN: 16 mg/dL (ref 8–23)
CO2: 26 mmol/L (ref 22–32)
Calcium: 8.1 mg/dL — ABNORMAL LOW (ref 8.9–10.3)
Chloride: 104 mmol/L (ref 98–111)
Creatinine, Ser: 1.33 mg/dL — ABNORMAL HIGH (ref 0.61–1.24)
GFR, Estimated: 54 mL/min — ABNORMAL LOW (ref >60)
Glucose, Bld: 97 mg/dL (ref 70–99)
Potassium: 3.8 mmol/L (ref 3.5–5.1)
Sodium: 139 mmol/L (ref 135–145)

## 2021-11-08 LAB — CBC
HCT: 39.6 % (ref 39.0–52.0)
Hemoglobin: 13.4 g/dL (ref 13.0–17.0)
MCH: 33.7 pg (ref 26.0–34.0)
MCHC: 33.8 g/dL (ref 30.0–36.0)
MCV: 99.5 fL (ref 80.0–100.0)
Platelets: UNDETERMINED 10*3/uL (ref 150–400)
RBC: 3.98 MIL/uL — ABNORMAL LOW (ref 4.22–5.81)
RDW: 17.7 % — ABNORMAL HIGH (ref 11.5–15.5)
WBC: 6.7 10*3/uL (ref 4.0–10.5)
nRBC: 0 % (ref 0.0–0.2)

## 2021-11-08 LAB — BRAIN NATRIURETIC PEPTIDE: B Natriuretic Peptide: 1158.1 pg/mL — ABNORMAL HIGH (ref 0.0–100.0)

## 2021-11-08 LAB — D-DIMER, QUANTITATIVE: D-Dimer, Quant: 2.57 ug/mL-FEU — ABNORMAL HIGH (ref 0.00–0.50)

## 2021-11-08 MED ORDER — ATORVASTATIN CALCIUM 10 MG PO TABS
20.0000 mg | ORAL_TABLET | Freq: Every day | ORAL | Status: DC
  Administered 2021-11-09 – 2021-11-10 (×2): 20 mg via ORAL
  Filled 2021-11-08 (×2): qty 2

## 2021-11-08 MED ORDER — IOHEXOL 350 MG/ML SOLN
80.0000 mL | Freq: Once | INTRAVENOUS | Status: AC | PRN
  Administered 2021-11-08: 80 mL via INTRAVENOUS

## 2021-11-08 MED ORDER — FUROSEMIDE 10 MG/ML IJ SOLN
40.0000 mg | Freq: Two times a day (BID) | INTRAMUSCULAR | Status: DC
  Administered 2021-11-09 – 2021-11-10 (×3): 40 mg via INTRAVENOUS
  Filled 2021-11-08 (×4): qty 4

## 2021-11-08 MED ORDER — ACETAMINOPHEN 650 MG RE SUPP
650.0000 mg | Freq: Four times a day (QID) | RECTAL | Status: DC | PRN
Start: 2021-11-08 — End: 2021-11-10

## 2021-11-08 MED ORDER — APIXABAN 5 MG PO TABS
5.0000 mg | ORAL_TABLET | Freq: Two times a day (BID) | ORAL | Status: DC
  Administered 2021-11-08 – 2021-11-10 (×4): 5 mg via ORAL
  Filled 2021-11-08 (×4): qty 1

## 2021-11-08 MED ORDER — POLYETHYLENE GLYCOL 3350 17 G PO PACK: 17.0000 g | PACK | Freq: Every day | ORAL | Status: DC | PRN

## 2021-11-08 MED ORDER — CARVEDILOL 3.125 MG PO TABS
3.1250 mg | ORAL_TABLET | Freq: Two times a day (BID) | ORAL | Status: DC
Start: 2021-11-09 — End: 2021-11-10
  Administered 2021-11-09 – 2021-11-10 (×4): 3.125 mg via ORAL
  Filled 2021-11-08 (×4): qty 1

## 2021-11-08 MED ORDER — ACETAMINOPHEN 325 MG PO TABS: 650.0000 mg | ORAL_TABLET | Freq: Four times a day (QID) | ORAL | Status: DC | PRN

## 2021-11-08 MED ORDER — LEVOTHYROXINE SODIUM 50 MCG PO TABS
50.0000 ug | ORAL_TABLET | Freq: Every day | ORAL | Status: DC
  Administered 2021-11-09 – 2021-11-10 (×2): 50 ug via ORAL
  Filled 2021-11-08: qty 2
  Filled 2021-11-08: qty 1

## 2021-11-08 MED ORDER — FUROSEMIDE 10 MG/ML IJ SOLN
40.0000 mg | Freq: Once | INTRAMUSCULAR | Status: AC
  Administered 2021-11-08: 40 mg via INTRAVENOUS
  Filled 2021-11-08: qty 4

## 2021-11-08 MED ORDER — LISINOPRIL 5 MG PO TABS
2.5000 mg | ORAL_TABLET | Freq: Every day | ORAL | Status: DC
Start: 2021-11-09 — End: 2021-11-10
  Administered 2021-11-09 – 2021-11-10 (×2): 2.5 mg via ORAL
  Filled 2021-11-08 (×3): qty 1

## 2021-11-08 MED ORDER — SODIUM CHLORIDE 0.9% FLUSH
3.0000 mL | Freq: Two times a day (BID) | INTRAVENOUS | Status: DC
  Administered 2021-11-08 – 2021-11-10 (×4): 3 mL via INTRAVENOUS

## 2021-11-08 NOTE — ED Provider Notes (Signed)
?[facility] ?Provider Note ? ? ?CSN: [id number] ?Arrival date & time: 11/08/21  1626 ? ?  ? ?History ? ?Chief Complaint  ?Patient presents with  ?? Shortness of Breath  ? ? ?Lucas Fuller is a 81 y.o. male. ? ? ?Shortness of Breath ?Associated symptoms: no fever   ? ?Patient has a history of thyroid disease, nonischemic cardiomyopathy, A-fib, CHF, bladder cancer, atrial flutter, stroke, essential hypertension.  Patient went to his primary care doctor's office.  He has been having some issues with shortness of breath as well as leg swelling.  His doctor noted to have significant dyspnea with just minimal exertion.  He felt the patient was likely in CHF exacerbation and felt he needed to come to the hospital for further evaluation and treatment.  Patient denies any chest pain.  No fevers or chills. ? ?Home Medications ?Prior to Admission medications   ?Medication Sig Start Date End Date Taking? Authorizing Provider  ?acetaminophen (TYLENOL) 500 MG tablet Take 1,000 mg by mouth every 6 (six) hours as needed for moderate pain or mild pain.    [provider]  ?albuterol (VENTOLIN HFA) 108 (90 Base) MCG/ACT inhaler Inhale 2 puffs into the lungs every 4 (four) hours as needed for wheezing or shortness of breath. 08/22/19   Hosie Poisson, MD  ?apixaban (ELIQUIS) 5 MG TABS tablet Take 1 tablet (5 mg total) by mouth 2 (two) times daily. 08/27/21   Patrecia Pour, MD  ?carvedilol (COREG) 3.125 MG tablet Take 1 tablet by mouth in the morning and at bedtime.    [provider]  ?furosemide (LASIX) 40 MG tablet Take 1 tablet (40 mg total) by mouth daily. 12/18/19   Baldwin Jamaica, PA-C  ?levothyroxine (SYNTHROID, LEVOTHROID) 50 MCG tablet Take 50 mcg by mouth daily before breakfast.    [provider]  ?lisinopril (ZESTRIL) 2.5 MG tablet Take 1 tablet (2.5 mg total) by mouth daily. 10/25/21   Evans Lance, MD  ?potassium chloride SA (KLOR-CON M20) 20 MEQ tablet  Take 1 tablet (20 mEq total) by mouth daily. 12/18/19   Baldwin Jamaica, PA-C  ?   ? ?Allergies    ?Asa [aspirin]   ? ?Review of Systems   ?Review of Systems  ?Constitutional:  Negative for fever.  ?Respiratory:  Positive for shortness of breath.   ? ?Physical Exam ?Updated Vital Signs ?BP (!) 144/119   Pulse 84   Temp 98.2 ?F (36.8 ?C) (Oral)   Resp (!) 21   Ht 1.676 m ('5\' 6"'$ )   Wt 79 kg   SpO2 100%   BMI 28.12 kg/m?  ?Physical Exam ?Vitals and nursing note reviewed.  ?Constitutional:   ?   Appearance: He is well-developed. He is not ill-appearing or diaphoretic.  ?HENT:  ?   Head: Normocephalic and atraumatic.  ?   Right Ear: External ear normal.  ?   Left Ear: External ear normal.  ?Eyes:  ?   General: No scleral icterus.    ?   Right eye: No discharge.     ?   Left eye: No discharge.  ?   Conjunctiva/sclera: Conjunctivae normal.  ?Neck:  ?   Trachea: No tracheal deviation.  ?Cardiovascular:  ?   Rate and Rhythm: Normal rate and regular rhythm.  ?Pulmonary:  ?   Effort: Pulmonary effort is normal. No respiratory distress.  ?   Breath sounds: Normal breath sounds. No stridor. No wheezing or rales.  ?Abdominal:  ?  General: Bowel sounds are normal. There is no distension.  ?   Palpations: Abdomen is soft.  ?   Tenderness: There is no abdominal tenderness. There is no guarding or rebound.  ?Musculoskeletal:     ?   General: No tenderness or deformity.  ?   Cervical back: Neck supple.  ?   Right lower leg: Edema present.  ?   Left lower leg: Edema present.  ?Skin: ?   General: Skin is warm and dry.  ?   Findings: No rash.  ?Neurological:  ?   General: No focal deficit present.  ?   Mental Status: He is alert.  ?   Cranial Nerves: No cranial nerve deficit (no facial droop, extraocular movements intact, no slurred speech).  ?   Sensory: No sensory deficit.  ?   Motor: No abnormal muscle tone or seizure activity.  ?   Coordination: Coordination normal.  ?Psychiatric:     ?   Mood and Affect: Mood normal.   ? ? ?ED Results / Procedures / Treatments   ?Labs ?(all labs ordered are listed, but only abnormal results are displayed) ?Labs Reviewed  ?BASIC METABOLIC PANEL - Abnormal; Notable for the following components:  ?    Result Value  ? Creatinine, Ser 1.33 (*)   ? Calcium 8.1 (*)   ? GFR, Estimated 54 (*)   ? All other components within normal limits  ?CBC - Abnormal; Notable for the following components:  ? RBC 3.98 (*)   ? RDW 17.7 (*)   ? All other components within normal limits  ?BRAIN NATRIURETIC PEPTIDE - Abnormal; Notable for the following components:  ? B Natriuretic Peptide 1,158.1 (*)   ? All other components within normal limits  ?D-DIMER, QUANTITATIVE - Abnormal; Notable for the following components:  ? D-Dimer, Quant 2.57 (*)   ? All other components within normal limits  ? ? ?EKG ?None ? ?Radiology ?DG Chest 2 View ? ?Result Date: 11/08/2021 ?CLINICAL DATA:  Shortness of breath EXAM: CHEST - 2 VIEW COMPARISON:  September 13, 2020 FINDINGS: Mild cardiomegaly. Thoracic aorta is mildly ectatic. Both lungs are clear. The visualized skeletal structures are unremarkable. IMPRESSION: No active cardiopulmonary disease. Electronically Signed   By: Frazier Richards M.D.   On: 11/08/2021 17:35  ? ?CT Angio Chest PE W and/or Wo Contrast ? ?Result Date: 11/08/2021 ?CLINICAL DATA:  Omni'350mg'$ /90m Patient had a TIA a few months ago. He was seeing his primary care provider today when they said he was in moderate respiratory distress and sent him here. History of CHF. Also history of paroxysmal A-fib.^81mOMNIPAQUE IOHEXOL 350 MG/ML SOLNPulmonary embolism (PE) suspected, high prob EXAM: CT ANGIOGRAPHY CHEST WITH CONTRAST TECHNIQUE: Multidetector CT imaging of the chest was performed using the standard protocol during bolus administration of intravenous contrast. Multiplanar CT image reconstructions and MIPs were obtained to evaluate the vascular anatomy. RADIATION DOSE REDUCTION: This exam was performed according to the  departmental dose-optimization program which includes automated exposure control, adjustment of the mA and/or kV according to patient size and/or use of iterative reconstruction technique. CONTRAST:  8029mMNIPAQUE IOHEXOL 350 MG/ML SOLN COMPARISON:  None. FINDINGS: Cardiovascular: No filling defects within the pulmonary arteries to suggest acute pulmonary embolism. Reflux of contrast into the hepatic veins. Mediastinum/Nodes: No axillary or supraclavicular adenopathy. No mediastinal or hilar adenopathy. No pericardial fluid. Esophagus normal. Lungs/Pleura: Diffuse ground-glass densities in the lungs. Small effusions. Upper Abdomen: Limited view of the liver, kidneys, pancreas are unremarkable. Normal adrenal  glands. Musculoskeletal: No aggressive osseous lesion. Review of the MIP images confirms the above findings. IMPRESSION: 1. No evidence acute pulmonary embolism. 2. Diffuse ground-glass opacities suggest mild pulmonary edema. 3. Reflux of contrast into the hepatic veins can indicate RIGHT heart failure. Electronically Signed   By: Suzy Bouchard M.D.   On: 11/08/2021 21:08   ? ?Procedures ?Procedures  ? ? ?Medications Ordered in ED ?Medications  ?furosemide (LASIX) injection 40 mg (40 mg Intravenous Given 11/08/21 1901)  ?iohexol (OMNIPAQUE) 350 MG/ML injection 80 mL (80 mLs Intravenous Contrast Given 11/08/21 2059)  ? ? ?ED Course/ Medical Decision Making/ A&P ?Clinical Course as of 11/08/21 2144  ?Wed Nov 08, 2021  ?1837 DG Chest 2 View ?CXR without acute findings [JK]  ?1837 CBC(!) ?nl [JK]  ?1837 Brain natriuretic peptide(!) ?Elevated bnp [JK]  ?0981 Basic metabolic panel(!) ?Cr elevated compared to last [JK]  ?1938 D-dimer, quantitative(!) ?D-dimer elevated 2.57.  We will proceed with CT [JK]  ?2124 CT Angio Chest PE W and/or Wo Contrast ?CT scan does not show evidence of PE but does suggest congestive heart failure [JK]  ?2143 Discussed with hospitalist, Dr. Marland Kitchen regarding admission [JK]  ?  ?Clinical  Course User Index ?[JK] Dorie Rank, MD  ? ?                        ?Medical Decision Making ?Amount and/or Complexity of Data Reviewed ?External Data Reviewed: notes. ?   Details: From primary care doctor's office visit today

## 2021-11-08 NOTE — H&P (Addendum)
?History and Physical  ? ?Lucas Fuller PTW:[id number] DOB: 1940/12/12 DOA: 11/08/2021 ? ?PCP: Wenda Low, MD  ? ?Patient coming from: PCP office ? ?Chief Complaint: Shortness of breath ? ?HPI: Lucas Fuller is a 81 y.o. male with medical history significant of hypothyroidism, degenerative disc disease, atrial fibrillation, CHF, hyperlipidemia, hypertension, CVA, ethanol use, bladder cancer who presents with worsening shortness of breath. ? ?Patient has noted some ongoing shortness of breath worse with exertion and lower extremity edema for the past couple of months but SOB has been worsening more significantly in the last week.  Saw his PCP today and they noted that he had significant dyspnea especially with minimal exertion and sent him to the ED for further evaluation. ? ?He denies fevers, chills, chest pain, abdominal pain, constipation, diarrhea, nausea, vomiting. ? ?ED Course: Vital signs in the ED significant for respiratory rate in the teens to 20s and blood pressure in the 700F to 749S systolic.  Lab work-up included CMP with creatinine 1.33 from baseline of 1, calcium 8.1.  CBC with platelets noted to be clumped.  BNP elevated to 1158.  D-dimer elevated to 2.57.  Chest x-ray showed no acute abnormality.  CTA was negative for PE but did show diffuse ground glass opacities concerning for pulmonary edema and reflux of contrast into the hepatic veins suggestive of right heart failure.  Patient received a dose of Lasix in the ED. ? ?Review of Systems: As per HPI otherwise all other systems reviewed and are negative. ? ?Past Medical History:  ?Diagnosis Date  ? Acute diastolic congestive heart failure ([place])   ? Acute systolic CHF (congestive heart failure) ([place])   ? Atrial fibrillation, persistent ([place])   ? Atrial flutter ([place]) 12/05/2015  ? Bladder cancer ([place])   ? Cardiomyopathy ([place])   ? CHF (congestive heart failure) ([place]) 12/05/2015  ? Chronic systolic CHF (congestive heart failure) ([place])   ? Edema  12/05/2015  ? Elevated brain natriuretic peptide (BNP) level   ? Essential hypertension 06/24/2018  ? ETOH abuse 12/05/2015  ? History of blood transfusion   ? Hypothyroidism   ? Lumbar disc disease 12/05/2015  ? Malnutrition of moderate degree 12/07/2015  ? Migraine   ? "years ago" (05/17/2016)  ? Non-ischemic cardiomyopathy ([place])   ? Paroxysmal atrial fibrillation (H[facility])   ? Pneumonia due to COVID-19 virus 08/18/2019  ? Protein calorie malnutrition ([place]) 12/05/2015  ? SOB (shortness of breath) 12/05/2015  ? Thrombocytopenia ([place]) 12/05/2015  ? Thyroid disease   ? Weight loss 12/05/2015  ? ? ?Past Surgical History:  ?Procedure Laterality Date  ? CARDIAC CATHETERIZATION N/A 12/08/2015  ? Procedure: Left Heart Cath and Coronary Angiography;  Surgeon: Lorretta Harp, MD;  Location: [facility];  Service: Cardiovascular;  Laterality: N/A;  ? CYSTOSCOPY WITH FULGERATION  2008  ? /notes 11/23/2010  ? ELECTROPHYSIOLOGIC STUDY N/A 05/17/2016  ? Procedure: A-Flutter Ablation;  Surgeon: Evans Lance, MD;  Location: [facility];  Service: Cardiovascular;  Laterality: N/A;  ? TONSILLECTOMY    ? TRANSURETHRAL RESECTION OF BLADDER  2008  ? Archie Endo 11/23/2010  ? ? ?Social History ? reports that he has never smoked. He has never used smokeless tobacco. He reports current alcohol use of about 6.0 standard drinks per week. He reports that he does not use drugs. ? ?Allergies  ?Allergen Reactions  ? Asa [Aspirin] Hives  ?  Pt can tolerate 81 mg with no issues  ? ? ?Family History  ?Problem  Relation Age of Onset  ? Heart attack Father 24  ? Kidney disease Sister   ? Hypertension Sister   ? Lung cancer Sister   ? CVA Sister   ? Diabetes Sister   ?Reviewed on admission ? ?Prior to Admission medications   ?Medication Sig Start Date End Date Taking? Authorizing Provider  ?acetaminophen (TYLENOL) 500 MG tablet Take 1,000 mg by mouth every 6 (six) hours as needed for moderate pain or mild pain.    [provider]  ?albuterol  (VENTOLIN HFA) 108 (90 Base) MCG/ACT inhaler Inhale 2 puffs into the lungs every 4 (four) hours as needed for wheezing or shortness of breath. 08/22/19   Hosie Poisson, MD  ?apixaban (ELIQUIS) 5 MG TABS tablet Take 1 tablet (5 mg total) by mouth 2 (two) times daily. 08/27/21   Patrecia Pour, MD  ?carvedilol (COREG) 3.125 MG tablet Take 1 tablet by mouth in the morning and at bedtime.    [provider]  ?furosemide (LASIX) 40 MG tablet Take 1 tablet (40 mg total) by mouth daily. 12/18/19   Baldwin Jamaica, PA-C  ?levothyroxine (SYNTHROID, LEVOTHROID) 50 MCG tablet Take 50 mcg by mouth daily before breakfast.    [provider]  ?lisinopril (ZESTRIL) 2.5 MG tablet Take 1 tablet (2.5 mg total) by mouth daily. 10/25/21   Evans Lance, MD  ?potassium chloride SA (KLOR-CON M20) 20 MEQ tablet Take 1 tablet (20 mEq total) by mouth daily. 12/18/19   Baldwin Jamaica, PA-C  ? ? ?Physical Exam: ?Vitals:  ? 11/08/21 1900 11/08/21 1930 11/08/21 2015 11/08/21 2130  ?BP: (!) 140/122 (!) 144/119 (!) 148/118 127/73  ?Pulse:  84 80 94  ?Resp: (!) 24 (!) 21 20 (!) 25  ?Temp:      ?TempSrc:      ?SpO2:  100% 100% 100%  ?Weight:      ?Height:      ? ? ?Physical Exam ?Constitutional:   ?   General: He is not in acute distress. ?   Appearance: Normal appearance.  ?HENT:  ?   Head: Normocephalic and atraumatic.  ?   Mouth/Throat:  ?   Mouth: Mucous membranes are moist.  ?   Pharynx: Oropharynx is clear.  ?Eyes:  ?   Extraocular Movements: Extraocular movements intact.  ?   Pupils: Pupils are equal, round, and reactive to light.  ?Cardiovascular:  ?   Rate and Rhythm: Normal rate. Rhythm irregular.  ?   Pulses: Normal pulses.  ?   Heart sounds: Normal heart sounds.  ?Pulmonary:  ?   Effort: Pulmonary effort is normal. No respiratory distress.  ?   Breath sounds: Rales (Trace) present.  ?Abdominal:  ?   General: Bowel sounds are normal. There is no distension.  ?   Palpations: Abdomen is soft.  ?   Tenderness: There is no  abdominal tenderness.  ?Musculoskeletal:     ?   General: No swelling or deformity.  ?   Right lower leg: Edema present.  ?   Left lower leg: Edema present.  ?Skin: ?   General: Skin is warm and dry.  ?Neurological:  ?   General: No focal deficit present.  ?   Mental Status: Mental status is at baseline.  ? ?Labs on Admission: I have personally reviewed following labs and imaging studies ? ?CBC: ?Recent Labs  ?Lab 11/08/21 ?1647  ?WBC 6.7  ?HGB 13.4  ?HCT 39.6  ?MCV 99.5  ?PLT PLATELET CLUMPS NOTED ON  SMEAR, UNABLE TO ESTIMATE  ? ? ?Basic Metabolic Panel: ?Recent Labs  ?Lab 11/08/21 ?1647  ?NA 139  ?K 3.8  ?CL 104  ?CO2 26  ?GLUCOSE 97  ?BUN 16  ?CREATININE 1.33*  ?CALCIUM 8.1*  ? ? ?GFR: ?Estimated Creatinine Clearance: 43.1 mL/min (A) (by C-G formula based on SCr of 1.33 mg/dL (H)). ? ?Liver Function Tests: ?No results for input(s): AST, ALT, ALKPHOS, BILITOT, PROT, ALBUMIN in the last 168 hours. ? ?Urine analysis: ?   ?Component Value Date/Time  ? [place] YELLOW 08/26/2021 1047  ? APPEARANCEUR CLEAR 08/26/2021 1047  ? LABSPEC 1.010 08/26/2021 1047  ? PHURINE 5.5 08/26/2021 1047  ? GLUCOSEU NEGATIVE 08/26/2021 1047  ? HGBUR NEGATIVE 08/26/2021 1047  ? [place] NEGATIVE 08/26/2021 1047  ? KETONESUR 40 (A) 08/26/2021 1047  ? PROTEINUR NEGATIVE 08/26/2021 1047  ? UROBILINOGEN 0.2 06/22/2009 0723  ? NITRITE NEGATIVE 08/26/2021 1047  ? LEUKOCYTESUR NEGATIVE 08/26/2021 1047  ? ? ?Radiological Exams on Admission: ?DG Chest 2 View ? ?Result Date: 11/08/2021 ?CLINICAL DATA:  Shortness of breath EXAM: CHEST - 2 VIEW COMPARISON:  September 13, 2020 FINDINGS: Mild cardiomegaly. Thoracic aorta is mildly ectatic. Both lungs are clear. The visualized skeletal structures are unremarkable. IMPRESSION: No active cardiopulmonary disease. Electronically Signed   By: Frazier Richards M.D.   On: 11/08/2021 17:35  ? ?CT Angio Chest PE W and/or Wo Contrast ? ?Result Date: 11/08/2021 ?CLINICAL DATA:  Omni'350mg'$ /26m Patient had a TIA a few  months ago. He was seeing his primary care provider today when they said he was in moderate respiratory distress and sent him here. History of CHF. Also history of paroxysmal A-fib.^822mOMNIPAQUE IOHEXOL 350 MG/

## 2021-11-08 NOTE — ED Triage Notes (Signed)
Per [facility] pt coming from doctors office. Patient having exertional shortness of breath and some edema- was diagnosed with acute CHF today.  ?

## 2021-11-08 NOTE — Progress Notes (Signed)
Pt has appt 11/09/21 with Tommye Standard, PAC  ?

## 2021-11-08 NOTE — ED Provider Triage Note (Signed)
Emergency Medicine Provider Triage Evaluation Note ? ?Lucas Fuller , a 81 y.o. male  was evaluated in triage.  Patient had a TIA a few months ago.  He was seeing his primary care provider today when they said he was in moderate respiratory distress and sent him here.  History of CHF.  Also history of paroxysmal A-fib. ? ?Review of Systems  ?Positive: Shortness of breath ?Negative: Chest pain, leg edema, dizziness ? ?Physical Exam  ?BP (!) 129/94 (BP Location: Left Arm)   Pulse 81   Temp 98.2 ?F (36.8 ?C) (Oral)   Resp 14   Ht '5\' 6"'$  (1.676 m)   Wt 79 kg   SpO2 97%   BMI 28.12 kg/m?  ?Gen:   Awake, no distress   ?Resp:  Normal effort  ?MSK:   Moves extremities without difficulty  ?Other:  Regular rate, lung sounds clear.  No peripheral edema ? ?Medical Decision Making  ?Medically screening exam initiated at 4:38 PM.  Appropriate orders placed.  Lucas Fuller was informed that the remainder of the evaluation will be completed by another provider, this initial triage assessment does not replace that evaluation, and the importance of remaining in the ED until their evaluation is complete. ? ? ?  ?Rhae Hammock, PA-C ?11/08/21 1639 ? ?

## 2021-11-08 NOTE — ED Notes (Signed)
Pt care taken, pt has a urinal, changed his bed ?

## 2021-11-09 ENCOUNTER — Ambulatory Visit: Payer: Medicare Other | Admitting: Physician Assistant

## 2021-11-09 ENCOUNTER — Observation Stay (HOSPITAL_BASED_OUTPATIENT_CLINIC_OR_DEPARTMENT_OTHER): Payer: Medicare Other

## 2021-11-09 DIAGNOSIS — Z8673 Personal history of transient ischemic attack (TIA), and cerebral infarction without residual deficits: Secondary | ICD-10-CM | POA: Diagnosis not present

## 2021-11-09 DIAGNOSIS — I5021 Acute systolic (congestive) heart failure: Secondary | ICD-10-CM | POA: Diagnosis not present

## 2021-11-09 DIAGNOSIS — I509 Heart failure, unspecified: Secondary | ICD-10-CM | POA: Diagnosis not present

## 2021-11-09 DIAGNOSIS — I10 Essential (primary) hypertension: Secondary | ICD-10-CM | POA: Diagnosis not present

## 2021-11-09 DIAGNOSIS — N179 Acute kidney failure, unspecified: Secondary | ICD-10-CM | POA: Diagnosis not present

## 2021-11-09 LAB — MAGNESIUM
Magnesium: 1.5 mg/dL — ABNORMAL LOW (ref 1.7–2.4)
Magnesium: 1.6 mg/dL — ABNORMAL LOW (ref 1.7–2.4)

## 2021-11-09 LAB — CBC
HCT: 40.1 % (ref 39.0–52.0)
Hemoglobin: 13.2 g/dL (ref 13.0–17.0)
MCH: 32.5 pg (ref 26.0–34.0)
MCHC: 32.9 g/dL (ref 30.0–36.0)
MCV: 98.8 fL (ref 80.0–100.0)
Platelets: 92 10*3/uL — ABNORMAL LOW (ref 150–400)
RBC: 4.06 MIL/uL — ABNORMAL LOW (ref 4.22–5.81)
RDW: 17.8 % — ABNORMAL HIGH (ref 11.5–15.5)
WBC: 5.4 10*3/uL (ref 4.0–10.5)
nRBC: 0 % (ref 0.0–0.2)

## 2021-11-09 LAB — COMPREHENSIVE METABOLIC PANEL
ALT: 20 U/L (ref 0–44)
AST: 26 U/L (ref 15–41)
Albumin: 3.2 g/dL — ABNORMAL LOW (ref 3.5–5.0)
Alkaline Phosphatase: 73 U/L (ref 38–126)
Anion gap: 11 (ref 5–15)
BUN: 16 mg/dL (ref 8–23)
CO2: 22 mmol/L (ref 22–32)
Calcium: 8.1 mg/dL — ABNORMAL LOW (ref 8.9–10.3)
Chloride: 103 mmol/L (ref 98–111)
Creatinine, Ser: 1.23 mg/dL (ref 0.61–1.24)
GFR, Estimated: 59 mL/min — ABNORMAL LOW (ref >60)
Glucose, Bld: 79 mg/dL (ref 70–99)
Potassium: 3.5 mmol/L (ref 3.5–5.1)
Sodium: 136 mmol/L (ref 135–145)
Total Bilirubin: 2.1 mg/dL — ABNORMAL HIGH (ref 0.3–1.2)
Total Protein: 6.4 g/dL — ABNORMAL LOW (ref 6.5–8.1)

## 2021-11-09 LAB — ECHOCARDIOGRAM COMPLETE
AR max vel: 1.62 cm2
AV Area VTI: 1.35 cm2
AV Area mean vel: 1.42 cm2
AV Mean grad: 6 mmHg
AV Peak grad: 8.6 mmHg
Ao pk vel: 1.47 m/s
Area-P 1/2: 3.93 cm2
Calc EF: 31.1 %
Height: 66 in
S' Lateral: 4.4 cm
Single Plane A2C EF: 33.6 %
Single Plane A4C EF: 33.7 %
Weight: 2787.2 oz

## 2021-11-09 MED ORDER — PERFLUTREN LIPID MICROSPHERE
1.0000 mL | INTRAVENOUS | Status: AC | PRN
  Administered 2021-11-09: 3 mL via INTRAVENOUS
  Filled 2021-11-09: qty 10

## 2021-11-09 NOTE — ED Notes (Addendum)
Pt taken off bedside commode. Pt had 1 BM  ?

## 2021-11-09 NOTE — Progress Notes (Signed)
Heart Failure Navigator Progress Note  Following this hospitalization to assess for HV TOC readiness.   Echo pending?  Tywon Niday, BSN, RN Heart Failure Nurse Navigator Secure Chat Only  

## 2021-11-09 NOTE — ED Notes (Signed)
Pt set up for breakfast, is pleasant, eating, a/ox4, on room air  ?

## 2021-11-09 NOTE — Progress Notes (Signed)
?  Echocardiogram ?2D Echocardiogram has been performed. ? ?Lucas Fuller ?11/09/2021, 10:58 AM ?

## 2021-11-09 NOTE — Plan of Care (Signed)
  Problem: Education: Goal: Ability to verbalize understanding of medication therapies will improve Outcome: Progressing   Problem: Activity: Goal: Capacity to carry out activities will improve Outcome: Progressing   

## 2021-11-09 NOTE — Care Management Obs Status (Signed)
MEDICARE OBSERVATION STATUS NOTIFICATION ? ? ?Patient Details  ?Name: Lucas Fuller ?MRN: [id number] ?Date of Birth: 10/21/1940 ? ? ?Medicare Observation Status Notification Given:  Yes ? ? ? ?Zenon Mayo, RN ?11/09/2021, 4:27 PM ?

## 2021-11-09 NOTE — ED Notes (Signed)
Patient transported to Ultrasound 

## 2021-11-09 NOTE — Progress Notes (Signed)
?PROGRESS NOTE ? ? ? ?Lucas Fuller  XQJ:[id number] DOB: 10-Feb-1941 DOA: 11/08/2021 ?PCP: Wenda Low, MD  ?Outpatient Specialists:  ? ? ? ?Brief Narrative:  ?Patient is an 81 year old male with past medical history significant for hypothyroidism, degenerative disc disease, atrial fibrillation, CHF, hyperlipidemia, hypertension, CVA, alcohol abuse, bladder cancer.  Patient was admitted with shortness of breath, lower extremity edema and dyspnea on exertion.  On presentation to the ED, Vital signs revealed respiratory rate in the teens to 20s and blood pressure in the 448J to 856D systolic.  Lab work-up included CMP with creatinine 1.33 from baseline of 1, calcium 8.1.  CBC with platelets noted to be clumped.  BNP elevated to 1158.  D-dimer elevated to 2.57.  Chest x-ray showed no acute abnormality.  CTA was negative for PE but did show diffuse ground glass opacities concerning for pulmonary edema and reflux of contrast into the hepatic veins suggestive of right heart failure.  Patient received a dose of Lasix in the ED. ? ?11/09/2021: Patient seen.  Patient is a very poor historian.  Patient remains volume overloaded.  We will have a low threshold to consult the cardiology team. ? ? ?Assessment & Plan: ?  ?Principal Problem: ?  CHF exacerbation ([place]) ?Active Problems: ?  Hypothyroidism ?  Paroxysmal atrial fibrillation ([facility]) ?  Essential hypertension ?  History of CVA (cerebrovascular accident) ?  AKI (acute kidney injury) ([place]) ? ? ?CHF exacerbation ?> Patient presenting with shortness of breath and lower extremity edema.  This has been gradually worsening.  Noted to have significant dyspnea even with minimal exertion at PCP visit was sent to the ED for further evaluation. ?> Continues to saturate well at rest but does have significant dyspnea on exertion as above.  BNP noted to be elevated to 1158.  CT with evidence of pulmonary edema and reflux of contrast into the hepatic veins suggestive of right heart failure  which would be new for him. ?> Given his significant shortness of breath will admit for IV diuresis.  We will repeat echocardiogram even though he had 1-2 months ago given suggestion of possible new right heart failure and need for clearance for upcoming procedure. ?- Monitor on telemetry ?- Strict I's and O's, daily weights ?- Echocardiogram ?- Fluid restricted diet ?- Trend renal function and electrolytes ?- Check magnesium ?- Lasix 40 mg IV twice daily ?- Continue home carvedilol, lisinopril ?11/09/2021: Continue IV Lasix.  Patient remains volume overloaded.  Patient is a poor historian. ?  ?AKI ?> Creatinine elevated 1.33 from baseline of around 1.  In the setting of CHF exacerbation as above. ?- Monitor response to diuresis ?- Avoid nephrotoxic agents when possible ?- Trend renal function and electrolytes ?11/09/2021: Continue to monitor closely. ?  ?Atrial fibrillation ?> History of atrial flutter status post ablation and now paroxysmal A-fib. ?- Continue home carvedilol and Eliquis ? ?Hypothyroidism ?- Continue home Synthroid ? ?Hypertension ?- Continue home lisinopril ? ?History of CVA ?HLD ?> Presented with stroke earlier this year.  Possibly secondary to paroxysmal A-fib. ?- Continue home anticoagulation and atorvastatin ?  ?History of bladder cancer ?> Has what sounds like surveillance procedure planned.  Was due have visit with cardiology for clearance tomorrow. ?- Noted ?  ? ? ?DVT prophylaxis: Eliquis ?Code Status: Full code ?Family Communication:  ?Disposition Plan:  ? ? ?Consultants:  ?None ? ?Procedures:  ?Echocardiogram revealed: ?1. Left ventricular ejection fraction, by estimation, is 30 to 35%. The  ?left ventricle has moderately decreased  function. The left ventricle  ?demonstrates global hypokinesis. There is moderate concentric left  ?ventricular hypertrophy. Left ventricular  ?diastolic parameters are indeterminate.  ? 2. Right ventricular systolic function is moderately reduced. The right   ?ventricular size is normal. There is mildly elevated pulmonary artery  ?systolic pressure. The estimated right ventricular systolic pressure is  ?64.3 mmHg.  ? 3. Left atrial size was mildly dilated.  ? 4. The mitral valve is degenerative. Mild mitral valve regurgitation. No  ?evidence of mitral stenosis.  ? 5. Tricuspid valve regurgitation is moderate. There are multiple jets.  ? 6. The aortic valve is calcified. There is moderate calcification of the  ?aortic valve. There is moderate thickening of the aortic valve. Aortic  ?valve regurgitation is not visualized. 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  ? 7. The inferior vena cava is dilated in size with <50% respiratory  ?variability, suggesting right atrial pressure of 15 mmHg.  ? ?Comparison(s): RV function and Trcisupid regurgitation worse from prior  ?study (08/27/21).  ? ?Antimicrobials:  ?None ? ? ?Subjective: ?Patient is a poor historian. ? ?Objective: ?Vitals:  ? 11/09/21 1340 11/09/21 1400 11/09/21 1401 11/09/21 1636  ?BP: 115/83  110/66 (!) 120/95  ?Pulse: 68  93 70  ?Resp: '15  20 20  '$ ?Temp: 98 ?F (36.7 ?C)  97.7 ?F (36.5 ?C) 97.8 ?F (36.6 ?C)  ?TempSrc: Oral  Oral Oral  ?SpO2: 92%  95% 95%  ?Weight:  75.2 kg    ?Height:  '5\' 7"'$  (1.702 m)    ? ? ?Intake/Output Summary (Last 24 hours) at 11/09/2021 1656 ?Last data filed at 11/09/2021 1600 ?Gross per 24 hour  ?Intake --  ?Output 2175 ml  ?Net -2175 ml  ? ?Filed Weights  ? 11/08/21 1631 11/09/21 1400  ?Weight: 79 kg 75.2 kg  ? ? ?Examination: ? ?General exam: Appears calm and comfortable  ?Respiratory system: Clear to auscultation.  ?Cardiovascular system: S1 & S2, systolic murmur.   ?Gastrointestinal system: Abdomen is soft and nontender. No organomegaly or masses felt. Normal bowel sounds heard. ?Central nervous system: Alert and oriented. No focal neurological deficits. ?Extremities: Bilateral lower extremity edema. ? ?Data Reviewed: I have personally reviewed following labs and imaging  studies ? ?CBC: ?Recent Labs  ?Lab 11/08/21 ?1647 11/09/21 ?0409  ?WBC 6.7 5.4  ?HGB 13.4 13.2  ?HCT 39.6 40.1  ?MCV 99.5 98.8  ?PLT PLATELET CLUMPS NOTED ON SMEAR, UNABLE TO ESTIMATE 92*  ? ?Basic Metabolic Panel: ?Recent Labs  ?Lab 11/08/21 ?1647 11/09/21 ?0409  ?NA 139 136  ?K 3.8 3.5  ?CL 104 103  ?CO2 26 22  ?GLUCOSE 97 79  ?BUN 16 16  ?CREATININE 1.33* 1.23  ?CALCIUM 8.1* 8.1*  ?MG  --  1.5*  ? ?GFR: ?Estimated Creatinine Clearance: 44 mL/min (by C-G formula based on SCr of 1.23 mg/dL). ?Liver Function Tests: ?Recent Labs  ?Lab 11/09/21 ?0409  ?AST 26  ?ALT 20  ?ALKPHOS 73  ?BILITOT 2.1*  ?PROT 6.4*  ?ALBUMIN 3.2*  ? ?No results for input(s): LIPASE, AMYLASE in the last 168 hours. ?No results for input(s): AMMONIA in the last 168 hours. ?Coagulation Profile: ?No results for input(s): INR, PROTIME in the last 168 hours. ?Cardiac Enzymes: ?No results for input(s): CKTOTAL, CKMB, CKMBINDEX, TROPONINI in the last 168 hours. ?BNP (last 3 results) ?No results for input(s): PROBNP in the last 8760 hours. ?HbA1C: ?No results for input(s): HGBA1C in the last 72 hours. ?CBG: ?No results for input(s): GLUCAP in the last 168  hours. ?Lipid Profile: ?No results for input(s): CHOL, HDL, LDLCALC, TRIG, CHOLHDL, LDLDIRECT in the last 72 hours. ?Thyroid Function Tests: ?No results for input(s): TSH, T4TOTAL, FREET4, T3FREE, THYROIDAB in the last 72 hours. ?Anemia Panel: ?No results for input(s): VITAMINB12, FOLATE, FERRITIN, TIBC, IRON, RETICCTPCT in the last 72 hours. ?Urine analysis: ?   ?Component Value Date/Time  ? [place] YELLOW 08/26/2021 1047  ? APPEARANCEUR CLEAR 08/26/2021 1047  ? LABSPEC 1.010 08/26/2021 1047  ? PHURINE 5.5 08/26/2021 1047  ? GLUCOSEU NEGATIVE 08/26/2021 1047  ? HGBUR NEGATIVE 08/26/2021 1047  ? [place] NEGATIVE 08/26/2021 1047  ? KETONESUR 40 (A) 08/26/2021 1047  ? PROTEINUR NEGATIVE 08/26/2021 1047  ? UROBILINOGEN 0.2 06/22/2009 0723  ? NITRITE NEGATIVE 08/26/2021 1047  ? LEUKOCYTESUR  NEGATIVE 08/26/2021 1047  ? ?Sepsis Labs: ?'@LABRCNTIP'$ (procalcitonin:4,lacticidven:4) ? ?)No results found for this or any previous visit (from the past 240 hour(s)).  ? ? ? ? ? ?Radiology Studies: ?DG Chest 2 View ? ?Result Dat

## 2021-11-09 NOTE — Progress Notes (Signed)
Pt arrived to the unit oriented room and unit. Condom cath in place placed on tele.  ?

## 2021-11-09 NOTE — ED Notes (Signed)
Mrs. Kollman was updated by this RN ?

## 2021-11-09 NOTE — ED Notes (Signed)
Pt given phone and this RN dialed Ms. Smith per pt request.  ?

## 2021-11-10 ENCOUNTER — Encounter (HOSPITAL_COMMUNITY): Payer: Self-pay | Admitting: Internal Medicine

## 2021-11-10 ENCOUNTER — Other Ambulatory Visit (HOSPITAL_COMMUNITY): Payer: Self-pay

## 2021-11-10 DIAGNOSIS — I480 Paroxysmal atrial fibrillation: Secondary | ICD-10-CM | POA: Diagnosis present

## 2021-11-10 DIAGNOSIS — Z79899 Other long term (current) drug therapy: Secondary | ICD-10-CM | POA: Diagnosis not present

## 2021-11-10 DIAGNOSIS — E038 Other specified hypothyroidism: Secondary | ICD-10-CM | POA: Diagnosis not present

## 2021-11-10 DIAGNOSIS — I509 Heart failure, unspecified: Secondary | ICD-10-CM | POA: Diagnosis present

## 2021-11-10 DIAGNOSIS — Z8249 Family history of ischemic heart disease and other diseases of the circulatory system: Secondary | ICD-10-CM | POA: Diagnosis not present

## 2021-11-10 DIAGNOSIS — Z7901 Long term (current) use of anticoagulants: Secondary | ICD-10-CM | POA: Diagnosis not present

## 2021-11-10 DIAGNOSIS — Z79890 Hormone replacement therapy (postmenopausal): Secondary | ICD-10-CM | POA: Diagnosis not present

## 2021-11-10 DIAGNOSIS — Z841 Family history of disorders of kidney and ureter: Secondary | ICD-10-CM | POA: Diagnosis not present

## 2021-11-10 DIAGNOSIS — Z8701 Personal history of pneumonia (recurrent): Secondary | ICD-10-CM | POA: Diagnosis not present

## 2021-11-10 DIAGNOSIS — Z801 Family history of malignant neoplasm of trachea, bronchus and lung: Secondary | ICD-10-CM | POA: Diagnosis not present

## 2021-11-10 DIAGNOSIS — Z833 Family history of diabetes mellitus: Secondary | ICD-10-CM | POA: Diagnosis not present

## 2021-11-10 DIAGNOSIS — E785 Hyperlipidemia, unspecified: Secondary | ICD-10-CM | POA: Diagnosis present

## 2021-11-10 DIAGNOSIS — Z8551 Personal history of malignant neoplasm of bladder: Secondary | ICD-10-CM | POA: Diagnosis not present

## 2021-11-10 DIAGNOSIS — I110 Hypertensive heart disease with heart failure: Secondary | ICD-10-CM | POA: Diagnosis present

## 2021-11-10 DIAGNOSIS — Z8673 Personal history of transient ischemic attack (TIA), and cerebral infarction without residual deficits: Secondary | ICD-10-CM | POA: Diagnosis not present

## 2021-11-10 DIAGNOSIS — Z8616 Personal history of COVID-19: Secondary | ICD-10-CM | POA: Diagnosis not present

## 2021-11-10 DIAGNOSIS — N179 Acute kidney failure, unspecified: Secondary | ICD-10-CM | POA: Diagnosis present

## 2021-11-10 DIAGNOSIS — Z886 Allergy status to analgesic agent status: Secondary | ICD-10-CM | POA: Diagnosis not present

## 2021-11-10 DIAGNOSIS — I5043 Acute on chronic combined systolic (congestive) and diastolic (congestive) heart failure: Secondary | ICD-10-CM | POA: Diagnosis present

## 2021-11-10 DIAGNOSIS — E039 Hypothyroidism, unspecified: Secondary | ICD-10-CM | POA: Diagnosis present

## 2021-11-10 DIAGNOSIS — I10 Essential (primary) hypertension: Secondary | ICD-10-CM | POA: Diagnosis not present

## 2021-11-10 DIAGNOSIS — Z823 Family history of stroke: Secondary | ICD-10-CM | POA: Diagnosis not present

## 2021-11-10 LAB — RENAL FUNCTION PANEL
Albumin: 3 g/dL — ABNORMAL LOW (ref 3.5–5.0)
Anion gap: 11 (ref 5–15)
BUN: 14 mg/dL (ref 8–23)
CO2: 26 mmol/L (ref 22–32)
Calcium: 8.1 mg/dL — ABNORMAL LOW (ref 8.9–10.3)
Chloride: 103 mmol/L (ref 98–111)
Creatinine, Ser: 1.22 mg/dL (ref 0.61–1.24)
GFR, Estimated: 60 mL/min — ABNORMAL LOW (ref >60)
Glucose, Bld: 108 mg/dL — ABNORMAL HIGH (ref 70–99)
Phosphorus: 3.7 mg/dL (ref 2.5–4.6)
Potassium: 3.8 mmol/L (ref 3.5–5.1)
Sodium: 140 mmol/L (ref 135–145)

## 2021-11-10 MED ORDER — SPIRONOLACTONE 25 MG PO TABS
12.5000 mg | ORAL_TABLET | Freq: Every day | ORAL | 1 refills | Status: DC
  Filled 2021-11-10: qty 30, 60d supply, fill #0

## 2021-11-10 MED ORDER — EMPAGLIFLOZIN 10 MG PO TABS
10.0000 mg | ORAL_TABLET | Freq: Every day | ORAL | 1 refills | Status: AC
  Filled 2021-11-10: qty 14, 14d supply, fill #0

## 2021-11-10 NOTE — Progress Notes (Signed)
Went over discharge instructions with Linsey Hirota, niece and POA. Made Hassan Rowan aware of medications changes and follow up appointments. PIV removed by NT and tolerated well. Tele monitor removed and CCMD made aware of discharge. TOC meds at bedside. Transportation en route. ?

## 2021-11-10 NOTE — Progress Notes (Signed)
Heart Failure Stewardship Pharmacist Progress Note ? ? ?PCP: Wenda Low, MD ?PCP-Cardiologist: [name], MD  ? ? ?HPI:  ?81 yo M with PMH of CHF, hypothyroidism, degenerative disc disease, afib, HLD, HTN, CVA, alcohol use, and bladder cancer. He presented to the ED on 4/19 following [place] visit. He was complaining of worsening dyspnea and some LE edema. CXR without active cardiopulmonary disease. CTA negative for PE and suggestive of mild pulmonary edema. An ECHO was done on 4/20 and LVEF was 30-35% (stable from prior in 08/2021 and 12/2019; was 20-25% in 11/2015 with clean coronaries on LHC).  ? ?Current HF Medications: ?Diuretic: furosemide 40 mg IV BID ?Beta Blocker: carvedilol 3.125 mg BID ?ACE/ARB/ARNI: lisinopril 2.5 mg daily ? ?Prior to admission HF Medications: ?Diuretic: furosemide 40 mg daily ?Beta blocker: carvedilol 3.125 mg BID ?ACE/ARB/ARNI: lisinopril 2.5 mg daily ? ?Pertinent Lab Values: ?Serum creatinine 1.22, BUN 14, Potassium 3.8, Sodium 140, BNP 1158.1, Magnesium 1.6, A1c 4.8  ? ?Vital Signs: ?Weight: 163 lbs (admission weight: 163 lbs) ?Blood pressure: 110/70s  ?Heart rate: 60s  ?I/O: -3.1L yesterday; net -2.6L ? ?Medication Assistance / Insurance Benefits Check: ?Does the patient have prescription insurance?  Yes ?Type of insurance plan: [employer] Medicare ? ?Does the patient qualify for medication assistance through manufacturers or grants?   Pending ?Eligible grants and/or patient assistance programs: pending ?Medication assistance applications in progress: none  ?Medication assistance applications approved: none ?Approved medication assistance renewals will be completed by: pending ? ?Outpatient Pharmacy:  ?[facility] ?Is the patient willing to use [facility] at discharge? Yes ?Is the patient willing to transition their outpatient pharmacy to utilize a [facility] outpatient pharmacy?   Pending ?  ? ?Assessment: ?1. Acute on chronic systolic CHF (EF  66-29%), due to NICM. NYHA class III symptoms. ?- Continue furosemide 40 mg IV BID ?- Continue carvedilol 3.125 mg BID ?- Continue lisinopril 2.5 mg daily - consider optimizing to [facility] as able. WIll need 36h washout or transition to ARB x 1 day prior to starting Entresto.  ?- Consider starting spironolactone and SGLT2i prior to discharge ? ?Plan: ?1) Medication changes recommended at this time: ?- Start spironolactone 12.5 mg daily ? ?2) Patient assistance: ?- Entresto $47 ?- Farxiga $47 ?- Jardiance $47 ? ?3)  Education  ?- To be completed prior to discharge ? ?Kerby Nora, PharmD, BCPS ?Heart Failure Stewardship Pharmacist ?Phone [phone] ? ? ?

## 2021-11-10 NOTE — Discharge Summary (Signed)
?Physician Discharge Summary ?  ?Patient: Lucas Fuller MRN: [id number] DOB: 03-Jul-1941  ?Admit date:     11/08/2021  ?Discharge date: 11/10/21  ?Discharge Physician: Bonnell Public  ? ?PCP: Wenda Low, MD  ? ?Recommendations at discharge:  ? ?Follow-up with primary care provider and cardiology within 1 week of discharge. ?Low-salt diet. ?Continue to monitor weight closely. ? ?Discharge Diagnoses: ?Principal Problem: ?  CHF exacerbation ([place]) ?Active Problems: ?  Hypothyroidism ?  Paroxysmal atrial fibrillation (HCC) ?  Essential hypertension ?  History of CVA (cerebrovascular accident) ?  AKI (acute kidney injury) ([place]) ? ?Resolved Problems: ?  * No resolved hospital problems. * ? ?Hospital Course: ?Patient is an 81 year old male with past medical history significant for hypothyroidism, degenerative disc disease, atrial fibrillation, CHF, hyperlipidemia, hypertension, CVA, alcohol abuse, bladder cancer.  Patient was admitted with shortness of breath, lower extremity edema and dyspnea on exertion.  On presentation to the ED, Vital signs revealed respiratory rate in the teens to 20s and blood pressure in the 762G to 315V systolic.  Lab work-up included CMP with creatinine 1.33 from baseline of 1, calcium 8.1.  CBC with platelets noted to be clumped.  BNP elevated to 1158.  D-dimer elevated to 2.57.  Chest x-ray showed no acute abnormality.  CTA was negative for PE but did show diffuse ground glass opacities concerning for pulmonary edema and reflux of contrast into the hepatic veins suggestive of right heart failure.  Patient received a dose of Lasix in the ED. ? ?11/09/2021: Patient seen.  Patient is a very poor historian.  Patient remains volume overloaded.  We will have a low threshold to consult the cardiology team ? ?Assessment and Plan: ?CHF exacerbation ?> Patient presenting with shortness of breath and lower extremity edema.  This has been gradually worsening.  Noted to have significant dyspnea even  with minimal exertion at PCP visit was sent to the ED for further evaluation. ?> Continues to saturate well at rest but does have significant dyspnea on exertion as above.  BNP noted to be elevated to 1158.  CT with evidence of pulmonary edema and reflux of contrast into the hepatic veins suggestive of right heart failure which would be new for him. ?> Given his significant shortness of breath will admit for IV diuresis.  We will repeat echocardiogram even though he had 1-2 months ago given suggestion of possible new right heart failure and need for clearance for upcoming procedure. ?- Monitor on telemetry ?- Strict I's and O's, daily weights ?- Echocardiogram ?- Fluid restricted diet ?- Trend renal function and electrolytes ?- Check magnesium ?- Lasix 40 mg IV twice daily ?- Continue home carvedilol, lisinopril ?11/09/2021: Continue IV Lasix.  Patient remains volume overloaded.  Patient is a poor historian. ?  ?AKI ?> Creatinine elevated 1.33 from baseline of around 1.  In the setting of CHF exacerbation as above. ?- Monitor response to diuresis ?- Avoid nephrotoxic agents when possible ?- Trend renal function and electrolytes ?11/09/2021: Continue to monitor closely. ?  ?Atrial fibrillation ?> History of atrial flutter status post ablation and now paroxysmal A-fib. ?- Continue home carvedilol and Eliquis ? ?Hypothyroidism ?- Continue home Synthroid ? ?Hypertension ?- Continue home lisinopril ? ?History of CVA ?HLD ?> Presented with stroke earlier this year.  Possibly secondary to paroxysmal A-fib. ?- Continue home anticoagulation and atorvastatin ?  ?History of bladder cancer ?> Has what sounds like surveillance procedure planned.  Was due have visit with cardiology for clearance tomorrow. ?-  Noted ?  ?  ?Consultants: None ?Procedures performed: None ?Disposition: Home ?Diet recommendation:  ?Discharge Diet Orders (From admission, onward)  ? ?  Start     Ordered  ? 11/10/21 0000  Diet - low sodium heart healthy        ? 11/10/21 1515  ? ?  ?  ? ?  ? ?Cardiac diet ?DISCHARGE MEDICATION: ?Allergies as of 11/10/2021   ? ?   Reactions  ? Asa [aspirin] Hives  ? Pt can tolerate 81 mg with no issues  ? ?  ? ?  ?Medication List  ?  ? ?STOP taking these medications   ? ?potassium chloride SA 20 MEQ tablet ?Commonly known as: [place]-Con M20 ?  ? ?  ? ?TAKE these medications   ? ?acetaminophen 500 MG tablet ?Commonly known as: TYLENOL ?Take 1,000 mg by mouth every 6 (six) hours as needed for moderate pain or mild pain. ?  ?albuterol 108 (90 Base) MCG/ACT inhaler ?Commonly known as: VENTOLIN HFA ?Inhale 2 puffs into the lungs every 4 (four) hours as needed for wheezing or shortness of breath. ?  ?apixaban 5 MG Tabs tablet ?Commonly known as: ELIQUIS ?Take 1 tablet (5 mg total) by mouth 2 (two) times daily. ?  ?atorvastatin 20 MG tablet ?Commonly known as: LIPITOR ?Take 20 mg by mouth daily. ?  ?carvedilol 3.125 MG tablet ?Commonly known as: COREG ?Take 1 tablet by mouth in the morning and at bedtime. ?  ?empagliflozin 10 MG Tabs tablet ?Commonly known as: Jardiance ?Take 1 tablet (10 mg total) by mouth daily before breakfast. ?  ?furosemide 40 MG tablet ?Commonly known as: LASIX ?Take 1 tablet (40 mg total) by mouth daily. ?  ?levothyroxine 50 MCG tablet ?Commonly known as: SYNTHROID ?Take 50 mcg by mouth daily before breakfast. ?  ?lisinopril 2.5 MG tablet ?Commonly known as: ZESTRIL ?Take 1 tablet (2.5 mg total) by mouth daily. ?  ?spironolactone 25 MG tablet ?Commonly known as: Aldactone ?Take 0.5 tablets (12.5 mg total) by mouth daily. ?  ? ?  ? ? Follow-up Information   ? ? [facility]. Go in 10 day(s).   ?Specialty: Cardiology ?Why: Hospital foloow up ?PLEASE bring list of all medications ?FREE valet parking, Entrance C, off of [address]. ?Contact information: ?[address] ?[phone] mc ?[place] ?[phone] ? ?  ?  ? ?  ?  ? ?  ? ?Discharge  Exam: ?Filed Weights  ? 11/08/21 1631 11/09/21 1400 11/09/21 2346  ?Weight: 79 kg 75.2 kg 74.1 kg  ? ? ? ?Condition at discharge: stable ? ?The results of significant diagnostics from this hospitalization (including imaging, microbiology, ancillary and laboratory) are listed below for reference.  ? ?Imaging Studies: ?DG Chest 2 View ? ?Result Date: 11/08/2021 ?CLINICAL DATA:  Shortness of breath EXAM: CHEST - 2 VIEW COMPARISON:  September 13, 2020 FINDINGS: Mild cardiomegaly. Thoracic aorta is mildly ectatic. Both lungs are clear. The visualized skeletal structures are unremarkable. IMPRESSION: No active cardiopulmonary disease. Electronically Signed   By: Frazier Richards M.D.   On: 11/08/2021 17:35  ? ?CT Angio Chest PE W and/or Wo Contrast ? ?Result Date: 11/08/2021 ?CLINICAL DATA:  Omni'350mg'$ /70m Patient had a TIA a few months ago. He was seeing his primary care provider today when they said he was in moderate respiratory distress and sent him here. History of CHF. Also history of paroxysmal A-fib.^843mOMNIPAQUE IOHEXOL 350 MG/ML SOLNPulmonary embolism (PE) suspected, high  prob EXAM: CT ANGIOGRAPHY CHEST WITH CONTRAST TECHNIQUE: Multidetector CT imaging of the chest was performed using the standard protocol during bolus administration of intravenous contrast. Multiplanar CT image reconstructions and MIPs were obtained to evaluate the vascular anatomy. RADIATION DOSE REDUCTION: This exam was performed according to the departmental dose-optimization program which includes automated exposure control, adjustment of the mA and/or kV according to patient size and/or use of iterative reconstruction technique. CONTRAST:  63m OMNIPAQUE IOHEXOL 350 MG/ML SOLN COMPARISON:  None. FINDINGS: Cardiovascular: No filling defects within the pulmonary arteries to suggest acute pulmonary embolism. Reflux of contrast into the hepatic veins. Mediastinum/Nodes: No axillary or supraclavicular adenopathy. No mediastinal or hilar adenopathy.  No pericardial fluid. Esophagus normal. Lungs/Pleura: Diffuse ground-glass densities in the lungs. Small effusions. Upper Abdomen: Limited view of the liver, kidneys, pancreas are unremarkable. Normal adrenal gla

## 2021-11-10 NOTE — Progress Notes (Signed)
Heart Failure Nurse Navigator Progress Note ? ?PCP: Wenda Low, MD ?PCP-Cardiologist: Dr. Lovena Le ?Admission Diagnosis: Acute on chronic congestive Heart Failure ?Admitted from: Doctors office ? ?Presentation:   ?Lucas Fuller presented with shortness of breath and leg swelling from his primary care doctor after it has been going on for a couple of months. BNP 1158, CTA was negative for a PE, concern for pulmonary edema. Patient was given IV lasix in ER. Echo 30-35% with plan to admit and diuresis, lab work, and diet restrictions.  ? ?Patient was educated with the use of the Heart failure book the importance of medication compliance, daily weights, diet/ fluid restrictions, the signs and symptoms of heart failure, when to call his doctor or go to the ER. Patient participated in conversation,. Patient stated thru the [place] interview that he doesn't always take all his medication, he lives with a roommate, cooks for himself, tries to stay away from salt, will drink at least 1-2 ginger ales per day, and doesn't always weight himself. His niece is the main person who drives him to his appointments and to do errands. He is scheduled for a HF TOC follow up appointment on 11/21/21 @ 11 am, and states that his niece will bring him.  ? ?ECHO/ LVEF: 30-35% ? ?Clinical Course: ? ?Past Medical History:  ?Diagnosis Date  ? Acute diastolic congestive heart failure ([place])   ? Acute systolic CHF (congestive heart failure) ([place])   ? Atrial fibrillation, persistent ([place])   ? Atrial flutter ([place]) 12/05/2015  ? Bladder cancer ([place])   ? Cardiomyopathy ([place])   ? CHF (congestive heart failure) ([place]) 12/05/2015  ? Chronic systolic CHF (congestive heart failure) ([place])   ? Edema 12/05/2015  ? Elevated brain natriuretic peptide (BNP) level   ? Essential hypertension 06/24/2018  ? ETOH abuse 12/05/2015  ? History of blood transfusion   ? Hypothyroidism   ? Lumbar disc disease 12/05/2015  ? Malnutrition of moderate degree 12/07/2015  ? Migraine   ?  "years ago" (05/17/2016)  ? Non-ischemic cardiomyopathy ([place])   ? Paroxysmal atrial fibrillation (HCC)   ? Pneumonia due to COVID-19 virus 08/18/2019  ? Protein calorie malnutrition ([place]) 12/05/2015  ? SOB (shortness of breath) 12/05/2015  ? Thrombocytopenia ([place]) 12/05/2015  ? Thyroid disease   ? Weight loss 12/05/2015  ?  ? ?Social History  ? ?Socioeconomic History  ? Marital status: Divorced  ?  Spouse name: Not on file  ? Number of children: 1  ? Years of education: Not on file  ? Highest education level: 10th grade  ?Occupational History  ? Occupation: Retired  ?  Comment: gets SS check every month  ?Tobacco Use  ? Smoking status: Never  ? Smokeless tobacco: Never  ?Vaping Use  ? Vaping Use: Never used  ?Substance and Sexual Activity  ? Alcohol use: Not Currently  ?  Alcohol/week: 6.0 standard drinks  ?  Types: 6 Cans of beer per week  ?  Comment: 05/17/2016 "stopped drinking ~ 09/2015"  ? Drug use: Not on file  ? Sexual activity: Not Currently  ?Other Topics Concern  ? Not on file  ?Social History Narrative  ? Not on file  ? ?Social Determinants of Health  ? ?Financial Resource Strain: Not on file  ?Food Insecurity: No Food Insecurity  ? Worried About Charity fundraiser in the Last Year: Never true  ? Ran Out of Food in the Last Year: Never true  ?Transportation Needs: No Transportation Needs  ?  Lack of Transportation (Medical): No  ? Lack of Transportation (Non-Medical): No  ?Physical Activity: Not on file  ?Stress: Not on file  ?Social Connections: Not on file  ? ?Education Assessment and Provision: ? ?Detailed education and instructions provided on heart failure disease management including the following: ? ?Signs and symptoms of Heart Failure ?When to call the physician ?Importance of daily weights ?Low sodium diet ?Fluid restriction ?Medication management ?Anticipated future follow-up appointments ? ?Patient education given on each of the above topics.  Patient acknowledges understanding via teach back method  and acceptance of all instructions. ? ?Education Materials:  "Living Better With Heart Failure" Booklet, HF zone tool, & Daily Weight Tracker Tool. ? ?Patient has scale at home: yes ?Patient has pill box at home: yes   ? ?High Risk Criteria for Readmission and/or Poor Patient Outcomes: ?Heart failure hospital admissions (last 6 months): 1  ?No Show rate: 11 % ?Difficult social situation: No ?Demonstrates medication adherence: No,  ?Primary Language: English ?Literacy level: Reading, writing, comprehension ? ?Barriers of Care:   ?Medication compliance   ?diet/fluid restrictions ? ?Considerations/Referrals:  ? ?Referral made to Heart Failure Pharmacist Stewardship: yes ?Referral made to Heart Failure CSW/NCM TOC: no ?Referral made to Heart & Vascular TOC clinic: yes, appointment 11/21/21 @ 11 am.  ? ?Items for Follow-up on DC/TOC: ?Optimize medications ?Medication compliance ?Diet/fluid restrictions ? ? ?Earnestine Leys, BSN, RN ?Heart Failure Nurse Navigator ?Secure Chat Only   ?

## 2021-11-20 NOTE — Progress Notes (Signed)
? ? ?HEART & VASCULAR TRANSITION OF CARE CONSULT NOTE  ? ? ? ?Referring Physician: Dr Marthenia Rolling ?Primary Care: Dr Lysle Rubens ?Primary Cardiologist: Dr Lovena Le  ? ?HPI: ?Referred to clinic by Dr Marthenia Rolling for heart failure consultation.  ? ?Lucas Fuller is a 81 year old with a h/o bladder cancer, stroke, PAF, hypothyroidism, and HFrEF. EF has been down for dating back to 2017.  ? ?EKG 08/28/21 SR  ? ?Admitted 11/25/2128 with embolic stroke. He had been on aspirin. Placed on eliquis. Discharged on 08/27/21.  ? ?On 11/03/21 had preop clearance by Angie Duke PA-C. Per Dr Lovena Le ok to stop eliquis x 2 days preop.  ? ?Admitted 11/08/2021 with A/C HFrEF. In A fib on admit. BNP 1158. Diuresed with IV lasix and transitioned to lasix 40 mg daily .  ? ?He presents Overall feeling fine. SOB with exertion but does ok when he takes his time. Denies PND/Orthopnea. Appetite ok. Eats [place] every other day. No fever or chills. He has not been weighing at home. Weight at home  pounds. Taking all medications. Lives with a roommate. He is able to drive but his nieces drive him to medical appointments.  ? ?Cardiac Testing  ?Echo Ef 30-35% RV mildly reduced.  ?Echo 12/2021 EF 30-35%  ?Echo 2021 EF 30-35%  ?Echo 2017 EFf 30-35%.  ? ?Review of Systems: [y] = yes, '[ ]'$  = no  ? ?General: Weight gain '[ ]'$ ; Weight loss '[ ]'$ ; Anorexia '[ ]'$ ; Fatigue [Y ]; Fever '[ ]'$ ; Chills '[ ]'$ ; Weakness '[ ]'$   ?Cardiac: Chest pain/pressure '[ ]'$ ; Resting SOB '[ ]'$ ; Exertional SOB '[ ]'$ ; Orthopnea '[ ]'$ ; Pedal Edema '[ ]'$ ; Palpitations '[ ]'$ ; Syncope '[ ]'$ ; Presyncope '[ ]'$ ; Paroxysmal nocturnal dyspnea'[ ]'$   ?Pulmonary: Cough '[ ]'$ ; Wheezing'[ ]'$ ; Hemoptysis'[ ]'$ ; Sputum '[ ]'$ ; Snoring '[ ]'$   ?GI: Vomiting'[ ]'$ ; Dysphagia'[ ]'$ ; Melena'[ ]'$ ; Hematochezia '[ ]'$ ; Heartburn'[ ]'$ ; Abdominal pain '[ ]'$ ; Constipation '[ ]'$ ; Diarrhea '[ ]'$ ; BRBPR '[ ]'$   ?GU: Hematuria'[ ]'$ ; Dysuria '[ ]'$ ; Nocturia'[ ]'$   ?Vascular: Pain in legs with walking '[ ]'$ ; Pain in feet with lying flat '[ ]'$ ; Non-healing sores '[ ]'$ ; Stroke [ Y]; TIA '[ ]'$ ; Slurred speech '[ ]'$ ;   ?Neuro: Headaches'[ ]'$ ; Vertigo'[ ]'$ ; Seizures'[ ]'$ ; Paresthesias'[ ]'$ ;Blurred vision '[ ]'$ ; Diplopia '[ ]'$ ; Vision changes '[ ]'$   ?Ortho/Skin: Arthritis '[ ]'$ ; Joint pain [ Y]; Muscle pain '[ ]'$ ; Joint swelling '[ ]'$ ; Back Pain [ Y]; Rash '[ ]'$   ?Psych: Depression'[ ]'$ ; Anxiety'[ ]'$   ?Heme: Bleeding problems '[ ]'$ ; Clotting disorders '[ ]'$ ; Anemia '[ ]'$   ?Endocrine: Diabetes '[ ]'$ ; Thyroid dysfunction[ Y] ? ? ?Past Medical History:  ?Diagnosis Date  ? Acute diastolic congestive heart failure ([place])   ? Acute systolic CHF (congestive heart failure) ([place])   ? Atrial fibrillation, persistent ([place])   ? Atrial flutter ([place]) 12/05/2015  ? Bladder cancer ([place])   ? Cardiomyopathy ([place])   ? CHF (congestive heart failure) ([place]) 12/05/2015  ? Chronic systolic CHF (congestive heart failure) ([place])   ? Edema 12/05/2015  ? Elevated brain natriuretic peptide (BNP) level   ? Essential hypertension 06/24/2018  ? ETOH abuse 12/05/2015  ? History of blood transfusion   ? Hypothyroidism   ? Lumbar disc disease 12/05/2015  ? Malnutrition of moderate degree 12/07/2015  ? Migraine   ? "years ago" (05/17/2016)  ? Non-ischemic cardiomyopathy ([place])   ? Paroxysmal atrial fibrillation (HCC)   ? Pneumonia due to COVID-19 virus 08/18/2019  ?  Protein calorie malnutrition ([place]) 12/05/2015  ? SOB (shortness of breath) 12/05/2015  ? Thrombocytopenia ([place]) 12/05/2015  ? Thyroid disease   ? Weight loss 12/05/2015  ? ? ?Current Outpatient Medications  ?Medication Sig Dispense Refill  ? acetaminophen (TYLENOL) 500 MG tablet Take 1,000 mg by mouth every 6 (six) hours as needed for moderate pain or mild pain.    ? albuterol (VENTOLIN HFA) 108 (90 Base) MCG/ACT inhaler Inhale 2 puffs into the lungs every 4 (four) hours as needed for wheezing or shortness of breath. 18 g 0  ? apixaban (ELIQUIS) 5 MG TABS tablet Take 1 tablet (5 mg total) by mouth 2 (two) times daily. 60 tablet 0  ? atorvastatin (LIPITOR) 20 MG tablet Take 20 mg by mouth daily.    ? carvedilol (COREG) 3.125 MG tablet Take 1 tablet by  mouth in the morning and at bedtime.    ? empagliflozin (JARDIANCE) 10 MG TABS tablet Take 1 tablet (10 mg total) by mouth daily before breakfast. 30 tablet 1  ? furosemide (LASIX) 40 MG tablet Take 1 tablet (40 mg total) by mouth daily. 30 tablet 11  ? levothyroxine (SYNTHROID, LEVOTHROID) 50 MCG tablet Take 50 mcg by mouth daily before breakfast.    ? lisinopril (ZESTRIL) 2.5 MG tablet Take 1 tablet (2.5 mg total) by mouth daily. 90 tablet 3  ? spironolactone (ALDACTONE) 25 MG tablet Take 0.5 tablets (12.5 mg total) by mouth daily. 30 tablet 1  ? ?No current facility-administered medications for this encounter.  ? ? ?Allergies  ?Allergen Reactions  ? Asa [Aspirin] Hives  ?  Pt can tolerate 81 mg with no issues  ? ? ?  ?Social History  ? ?Socioeconomic History  ? Marital status: Divorced  ?  Spouse name: Not on file  ? Number of children: 1  ? Years of education: Not on file  ? Highest education level: 10th grade  ?Occupational History  ? Occupation: Retired  ?  Comment: gets SS check every month  ?Tobacco Use  ? Smoking status: Never  ? Smokeless tobacco: Never  ?Vaping Use  ? Vaping Use: Never used  ?Substance and Sexual Activity  ? Alcohol use: Not Currently  ?  Alcohol/week: 6.0 standard drinks  ?  Types: 6 Cans of beer per week  ?  Comment: 05/17/2016 "stopped drinking ~ 09/2015"  ? Drug use: Not on file  ? Sexual activity: Not Currently  ?Other Topics Concern  ? Not on file  ?Social History Narrative  ? Not on file  ? ?Social Determinants of Health  ? ?Financial Resource Strain: Not on file  ?Food Insecurity: No Food Insecurity  ? Worried About Charity fundraiser in the Last Year: Never true  ? Ran Out of Food in the Last Year: Never true  ?Transportation Needs: No Transportation Needs  ? Lack of Transportation (Medical): No  ? Lack of Transportation (Non-Medical): No  ?Physical Activity: Not on file  ?Stress: Not on file  ?Social Connections: Not on file  ?Intimate Partner Violence: Not on file  ? ? ?   ?Family History  ?Problem Relation Age of Onset  ? Heart attack Father 25  ? Kidney disease Sister   ? Hypertension Sister   ? Lung cancer Sister   ? CVA Sister   ? Diabetes Sister   ? ? ?Vitals:  ? 11/21/21 1108  ?BP: 135/80  ?Pulse: 79  ?SpO2: 95%  ?Weight: 74.8 kg  ? ?Wt Readings from Last 3 Encounters:  ?  11/21/21 74.8 kg  ?11/09/21 74.1 kg  ?10/09/21 69.9 kg  ? ? ?PHYSICAL EXAM: ?General:  Walked in the clinic. No respiratory difficulty ?HEENT: normal ?Neck: supple. no JVD. Carotids 2+ bilat; no bruits. No lymphadenopathy or thryomegaly appreciated. ?Cor: PMI nondisplaced. Irregular rate & rhythm. No rubs, gallops or murmurs. ?Lungs: clear ?Abdomen: soft, nontender, nondistended. No hepatosplenomegaly. No bruits or masses. Good bowel sounds. ?Extremities: no cyanosis, clubbing, rash, edema ?Neuro: alert & oriented x 3, cranial nerves grossly intact. moves all 4 extremities w/o difficulty. Affect pleasant. ? ?ECG: A fib 80 bpm  ? ? ?ASSESSMENT & PLAN: ?1. Chronic HFrEF, NICM ? EF has been 30-35% dating back to 2017.  ?NYHA II-III ?GDMT  ?-Diuretic-Volume status stable. Continue lasix 40 mg daily  ?-BB-Continue low dose coreg.  ?-Ace/ARB/ARNI- On low dose lisinopril. Looking back he has some lower SBP. Could consider entresto down the road.  ?-MRA- Continue spironolactone 25 mg daily  ?-SGLT2i- Continue jardiance daily  ?- Discussed low salt food choices. Suspect diet has played a role with his volume status.  ? ?2. PAF  ?EKG 08/26/21-->SR   ?-In A fib today.  Rate contorlled.  ?- Continue low dose carvedilol .  ?-On eliquis 5 mg twice a day.  ?- Will send message to Vick Frees PA-C.  Not sure about timing of cardioversion given the need to bladder surgery. He would need to be off eliquis for few days prior surgery.  ? ?3. CVA, Cardio Embolic ?10/2021. No deficits.  ?On eliquis and statin.  ? ? ?Referred to HFSW (PCP, Medications, Transportation, ETOH Abuse, Drug Abuse, Insurance, Financial ):  No ?Refer to  Pharmacy:  No ?Refer to Home Health: No ?Refer to Advanced Heart Failure Clinic: no  ?Refer to General Cardiology: Established with Dr Lovena Le  ? ?Follow up as needed.  ? ?Lucas Mochizuki NP-C  ?2:10 PM ? ? ?

## 2021-11-21 ENCOUNTER — Ambulatory Visit (HOSPITAL_COMMUNITY)
Admit: 2021-11-21 | Discharge: 2021-11-21 | Disposition: A | Discharge status: Home / Self Care | Payer: Medicare Other | Source: Ambulatory Visit | Attending: Adult Health | Admitting: Adult Health

## 2021-11-21 ENCOUNTER — Telehealth (HOSPITAL_COMMUNITY): Payer: Self-pay | Admitting: *Deleted

## 2021-11-21 VITALS — BP 135/80 | HR 79 | Wt 165.0 lb

## 2021-11-21 DIAGNOSIS — I4819 Other persistent atrial fibrillation: Secondary | ICD-10-CM | POA: Diagnosis not present

## 2021-11-21 DIAGNOSIS — R0602 Shortness of breath: Secondary | ICD-10-CM | POA: Insufficient documentation

## 2021-11-21 DIAGNOSIS — I428 Other cardiomyopathies: Secondary | ICD-10-CM | POA: Insufficient documentation

## 2021-11-21 DIAGNOSIS — Z7901 Long term (current) use of anticoagulants: Secondary | ICD-10-CM | POA: Insufficient documentation

## 2021-11-21 DIAGNOSIS — Z8673 Personal history of transient ischemic attack (TIA), and cerebral infarction without residual deficits: Secondary | ICD-10-CM | POA: Diagnosis not present

## 2021-11-21 DIAGNOSIS — Z8616 Personal history of COVID-19: Secondary | ICD-10-CM | POA: Insufficient documentation

## 2021-11-21 DIAGNOSIS — I480 Paroxysmal atrial fibrillation: Secondary | ICD-10-CM | POA: Diagnosis not present

## 2021-11-21 DIAGNOSIS — I5042 Chronic combined systolic (congestive) and diastolic (congestive) heart failure: Secondary | ICD-10-CM | POA: Diagnosis not present

## 2021-11-21 DIAGNOSIS — Z79899 Other long term (current) drug therapy: Secondary | ICD-10-CM | POA: Diagnosis not present

## 2021-11-21 DIAGNOSIS — I110 Hypertensive heart disease with heart failure: Secondary | ICD-10-CM | POA: Insufficient documentation

## 2021-11-21 DIAGNOSIS — E039 Hypothyroidism, unspecified: Secondary | ICD-10-CM | POA: Insufficient documentation

## 2021-11-21 LAB — BASIC METABOLIC PANEL
Anion gap: 7 (ref 5–15)
BUN: 19 mg/dL (ref 8–23)
CO2: 25 mmol/L (ref 22–32)
Calcium: 9 mg/dL (ref 8.9–10.3)
Chloride: 108 mmol/L (ref 98–111)
Creatinine, Ser: 1.02 mg/dL (ref 0.61–1.24)
GFR, Estimated: >60 mL/min (ref >60)
Glucose, Bld: 96 mg/dL (ref 70–99)
Potassium: 4.6 mmol/L (ref 3.5–5.1)
Sodium: 140 mmol/L (ref 135–145)

## 2021-11-21 MED ORDER — ATORVASTATIN CALCIUM 20 MG PO TABS: 20.0000 mg | ORAL_TABLET | Freq: Every day | ORAL | 0 refills | Status: DC

## 2021-11-21 NOTE — Telephone Encounter (Signed)
Heart Failure Nurse Navigator Progress Note  ? ?Spoke with patient and confirmed his 11 am appointment on 11/21/2021. We went over the address and who he would be seeing today and what to bring with him .  ? ? ?Earnestine Leys, BSN, RN ?Heart Failure Nurse Navigator ?Secure Chat Only  ?

## 2021-11-21 NOTE — Patient Instructions (Signed)
Thank you for allowing [place] to provider your heart failure care after your recent hospitalization. Please follow-up with Dr. Lovena Le  ? ?If you have any questions, issues, or concerns before your next appointment please call our office at [phone], opt. 2 and leave a message for the triage nurse. ?  ? ?

## 2021-11-23 DIAGNOSIS — I5022 Chronic systolic (congestive) heart failure: Secondary | ICD-10-CM | POA: Diagnosis not present

## 2021-11-23 DIAGNOSIS — I5023 Acute on chronic systolic (congestive) heart failure: Secondary | ICD-10-CM | POA: Diagnosis not present

## 2021-11-23 DIAGNOSIS — N179 Acute kidney failure, unspecified: Secondary | ICD-10-CM | POA: Diagnosis not present

## 2022-01-03 ENCOUNTER — Encounter: Payer: Self-pay | Admitting: Internal Medicine

## 2022-01-03 ENCOUNTER — Ambulatory Visit: Payer: Medicare Other | Admitting: Internal Medicine

## 2022-01-03 VITALS — BP 120/80 | HR 59 | Ht 67.5 in | Wt 163.0 lb

## 2022-01-03 DIAGNOSIS — I480 Paroxysmal atrial fibrillation: Secondary | ICD-10-CM | POA: Diagnosis not present

## 2022-01-03 DIAGNOSIS — I5042 Chronic combined systolic (congestive) and diastolic (congestive) heart failure: Secondary | ICD-10-CM

## 2022-01-03 DIAGNOSIS — Z8673 Personal history of transient ischemic attack (TIA), and cerebral infarction without residual deficits: Secondary | ICD-10-CM | POA: Diagnosis not present

## 2022-01-03 NOTE — Progress Notes (Signed)
HPI Lucas Fuller returns today for ongoing evaluation and management of paroxysmal atrial fibrillation, nonischemic cardiomyopathy, chronic systolic heart failure.  The patient has a history of orthostatic syncope which occurred last year and has not recurred.  When I saw him he was on fairly good medical therapy.  Repeat echocardiography has demonstrated a reduction in his left ventricular systolic function to 46%. He had a stroke off of anti-coagulation. he has been found to have bladder CA. He is pending bladder surgery.  Allergies  Allergen Reactions   Asa [Aspirin] Hives    Pt can tolerate 81 mg with no issues     Current Outpatient Medications  Medication Sig Dispense Refill   acetaminophen (TYLENOL) 500 MG tablet Take 1,000 mg by mouth every 6 (six) hours as needed for moderate pain or mild pain.     albuterol (VENTOLIN HFA) 108 (90 Base) MCG/ACT inhaler Inhale 2 puffs into the lungs every 4 (four) hours as needed for wheezing or shortness of breath. 18 g 0   apixaban (ELIQUIS) 5 MG TABS tablet Take 1 tablet (5 mg total) by mouth 2 (two) times daily. 60 tablet 0   atorvastatin (LIPITOR) 20 MG tablet Take 1 tablet (20 mg total) by mouth daily. 30 tablet 0   carvedilol (COREG) 3.125 MG tablet Take 1 tablet by mouth in the morning and at bedtime.     empagliflozin (JARDIANCE) 10 MG TABS tablet Take 1 tablet (10 mg total) by mouth daily before breakfast. 30 tablet 1   furosemide (LASIX) 40 MG tablet Take 1 tablet (40 mg total) by mouth daily. 30 tablet 11   levothyroxine (SYNTHROID, LEVOTHROID) 50 MCG tablet Take 50 mcg by mouth daily before breakfast.     lisinopril (ZESTRIL) 2.5 MG tablet Take 1 tablet (2.5 mg total) by mouth daily. 90 tablet 3   potassium chloride (KLOR-CON) 20 MEQ packet Take 20 mEq by mouth daily.     spironolactone (ALDACTONE) 25 MG tablet Take 0.5 tablets (12.5 mg total) by mouth daily. (Patient taking differently: Take 25 mg by mouth daily.) 30 tablet 1   No  current facility-administered medications for this visit.     Past Medical History:  Diagnosis Date   Acute diastolic congestive heart failure (HCC)    Acute systolic CHF (congestive heart failure) (HCC)    Atrial fibrillation, persistent (HCC)    Atrial flutter ([place]) 12/05/2015   Bladder cancer (HCC)    Cardiomyopathy (HCC)    CHF (congestive heart failure) ([place]) 5/68/1275   Chronic systolic CHF (congestive heart failure) (HCC)    Edema 12/05/2015   Elevated brain natriuretic peptide (BNP) level    Essential hypertension 06/24/2018   ETOH abuse 12/05/2015   History of blood transfusion    Hypothyroidism    Lumbar disc disease 12/05/2015   Malnutrition of moderate degree 12/07/2015   Migraine    "years ago" (05/17/2016)   Non-ischemic cardiomyopathy (HCC)    Paroxysmal atrial fibrillation ([place])    Pneumonia due to COVID-19 virus 08/18/2019   Protein calorie malnutrition ([place]) 12/05/2015   SOB (shortness of breath) 12/05/2015   Thrombocytopenia ([place]) 12/05/2015   Thyroid disease    Weight loss 12/05/2015    ROS:   All systems reviewed and negative except as noted in the HPI.   Past Surgical History:  Procedure Laterality Date   CARDIAC CATHETERIZATION N/A 12/08/2015   Procedure: Left Heart Cath and Coronary Angiography;  Surgeon: Lorretta Harp, MD;  Location: [facility];  Service: Cardiovascular;  Laterality: N/A;   CYSTOSCOPY WITH FULGERATION  2008   Archie Endo 11/23/2010   ELECTROPHYSIOLOGIC STUDY N/A 05/17/2016   Procedure: A-Flutter Ablation;  Surgeon: Evans Lance, MD;  Location: [facility];  Service: Cardiovascular;  Laterality: N/A;   TONSILLECTOMY     TRANSURETHRAL RESECTION OF BLADDER  2008   Archie Endo 11/23/2010     Family History  Problem Relation Age of Onset   Heart attack Father 43   Kidney disease Sister    Hypertension Sister    Lung cancer Sister    CVA Sister    Diabetes Sister      Social History   Socioeconomic History   Marital  status: Divorced    Spouse name: Not on file   Number of children: 1   Years of education: Not on file   Highest education level: 10th grade  Occupational History   Occupation: Retired    Comment: gets Financial planner every month  Tobacco Use   Smoking status: Never   Smokeless tobacco: Never  Vaping Use   Vaping Use: Never used  Substance and Sexual Activity   Alcohol use: Not Currently    Alcohol/week: 6.0 standard drinks of alcohol    Types: 6 Cans of beer per week    Comment: 05/17/2016 "stopped drinking ~ 09/2015"   Drug use: Not on file   Sexual activity: Not Currently  Other Topics Concern   Not on file  Social History Narrative   Not on file   Social Determinants of Health   Financial Resource Strain: Not on file  Food Insecurity: No Food Insecurity (11/10/2021)   Hunger Vital Sign    Worried About Running Out of Food in the Last Year: Never true    Ran Out of Food in the Last Year: Never true  Transportation Needs: No Transportation Needs (11/10/2021)   PRAPARE - Hydrologist (Medical): No    Lack of Transportation (Non-Medical): No  Physical Activity: Not on file  Stress: Not on file  Social Connections: Not on file  Intimate Partner Violence: Not on file     BP 120/80   Pulse (!) 59   Ht 5' 7.5" (1.715 m)   Wt 163 lb (73.9 kg)   SpO2 95%   BMI 25.15 kg/m   Physical Exam:  Well appearing NAD HEENT: Unremarkable Neck:  No JVD, no thyromegally Lymphatics:  No adenopathy Back:  No CVA tenderness Lungs:  Clear with no wheezes HEART:  Regular rate rhythm, no murmurs, no rubs, no clicks Abd:  soft, positive bowel sounds, no organomegally, no rebound, no guarding Ext:  2 plus pulses, no edema, no cyanosis, no clubbing Skin:  No rashes no nodules Neuro:  CN II through XII intact, motor grossly intact  EKG - atrial fib with a controlled VR  DEVICE  Normal device function.  See PaceArt for details.   Assess/Plan:  1.  Chronic  systolic heart failure -his symptoms are class II.  His EF is 35%.  He is on guideline directed medical therapy.  2.  Paroxysmal atrial fibrillation - he now has persistent atrial fib.  3.  Syncope -none since his last visit. 4. Stroke -he has had a nice recovery. His hand strength is back to normal 5. Preoperative eval - he is an acceptable risk for pending urological surgery. He may come off of his systemic anti-coagulation for 2 or 3 days and restart systemic anti-coagulation  when the bleeding risk is acceptable.  Royston Sinner Child [place],MD

## 2022-01-03 NOTE — Patient Instructions (Signed)
Medication Instructions:  ?Your physician recommends that you continue on your current medications as directed. Please refer to the Current Medication list given to you today. ? ?Labwork: ?None ordered. ? ?Testing/Procedures: ?None ordered. ? ?Follow-Up: ? ?Your physician wants you to follow-up in: one year with Gregg Taylor, MD or one of the following Advanced Practice Providers on your designated Care Team:   ?Renee Ursuy, PA-C ?Michael "Andy" Tillery, PA-C ? ? ?Any Other Special Instructions Will Be Listed Below (If Applicable). ? ?If you need a refill on your cardiac medications before your next appointment, please call your pharmacy.  ? ?Important Information About Sugar ? ? ? ? ? ? ? ?

## 2022-01-26 ENCOUNTER — Other Ambulatory Visit: Payer: Self-pay | Admitting: Internal Medicine

## 2022-03-13 ENCOUNTER — Ambulatory Visit: Payer: Medicare Other | Admitting: Internal Medicine

## 2022-04-09 NOTE — Progress Notes (Deleted)
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  Consider restarting.  LDL goal less than 70             - Normotensive blood pressure             - Hgb A1c goal less than 7 2  Mediterranean diet 3  Routine exercise 4  Follow up 6 months.     Subjective:  Lucas Fuller is an 81 year old right-handed male with HTN, paroxysmal atrial fib, CHF and residual left sided deficits due to prior stroke who follows up for stroke.  UPDATE: Current medications:  Eliquis, ***  ***   HISTORY: Admitted for stroke at [name] on 08/26/2021, presenting with right hand numbness and incoordination.  He had trouble using his hands to dress himself.  CT head showed acute to subacute right MCA infarct.  Follow up MRI of brain showed right MCA infarct, punctate acute infarct in right parietal lobe, acute/subacute infarct in left centrum semiovale and left parietal periventricular white matter as well as chronic bilateral cerebellar infarcts.  CTA head and neck showed thrombosed posterior right MCA M2 segment, otherwise mild atherosclerosis in the head and neck.  2D echocardiogram showed LVEF 30-35%, grade II diastolic dysfunction, mild MR, mild-mod ASand no atrial level shunt.  LDL was 71.  Hgb A1c was 4.8.  He was on ASA prior to admission.  Has history of PAF but not taking Eliquis due to cost.  He was discharged on Eliquis and continued on atorvastatin '20mg'$ , lisinopril, Coreg.  He had some home PT which has since finished.  He is now using a walker to assist ambulating.  He says he sprained his right ankle which hurts when he bears weight on it.   Has a podiatry appointment next week.   PAST MEDICAL HISTORY: Past Medical History:  Diagnosis Date   Acute diastolic congestive heart failure (HCC)    Acute systolic CHF (congestive heart failure) (HCC)    Atrial fibrillation, persistent (HCC)    Atrial flutter ([place]) 12/05/2015   Bladder cancer (HCC)    Cardiomyopathy (HCC)    CHF (congestive heart failure) ([place]) 2/97/9892   Chronic systolic CHF (congestive heart failure) (HCC)    Edema 12/05/2015   Elevated brain natriuretic peptide (BNP) level    Essential hypertension 06/24/2018   ETOH abuse 12/05/2015   History of blood transfusion    Hypothyroidism    Lumbar disc disease 12/05/2015   Malnutrition of moderate degree 12/07/2015   Migraine    "years ago" (05/17/2016)   Non-ischemic cardiomyopathy (HCC)    Paroxysmal atrial fibrillation ([place])    Pneumonia due to COVID-19 virus 08/18/2019   Protein calorie malnutrition ([place]) 12/05/2015   SOB (shortness of breath) 12/05/2015   Thrombocytopenia ([place]) 12/05/2015   Thyroid disease    Weight loss 12/05/2015    MEDICATIONS: Current Outpatient Medications on File Prior to Visit  Medication Sig Dispense Refill   acetaminophen (TYLENOL) 500 MG tablet Take 1,000 mg by mouth every 6 (six) hours as needed for moderate  pain or mild pain.     albuterol (VENTOLIN HFA) 108 (90 Base) MCG/ACT inhaler Inhale 2 puffs into the lungs every 4 (four) hours as needed for wheezing or shortness of breath. 18 g 0   apixaban (ELIQUIS) 5 MG TABS tablet Take 1 tablet (5 mg total) by mouth 2 (two) times daily. 60 tablet 0   atorvastatin (LIPITOR) 20 MG tablet Take 1 tablet (20 mg total) by mouth daily. 30 tablet 0   carvedilol (COREG) 3.125 MG tablet Take 1 tablet by mouth in the morning and at bedtime.     furosemide (LASIX) 40 MG tablet Take 1 tablet (40 mg total) by mouth daily. 30 tablet 11   levothyroxine (SYNTHROID, LEVOTHROID) 50 MCG tablet Take 50 mcg by mouth daily before breakfast.     lisinopril  (ZESTRIL) 2.5 MG tablet TAKE 1 TABLET BY MOUTH EVERY DAY 90 tablet 3   potassium chloride (KLOR-CON) 20 MEQ packet Take 20 mEq by mouth daily.     spironolactone (ALDACTONE) 25 MG tablet Take 0.5 tablets (12.5 mg total) by mouth daily. (Patient taking differently: Take 25 mg by mouth daily.) 30 tablet 1   No current facility-administered medications on file prior to visit.    ALLERGIES: Allergies  Allergen Reactions   Asa [Aspirin] Hives    Pt can tolerate 81 mg with no issues    FAMILY HISTORY: Family History  Problem Relation Age of Onset   Heart attack Father 54   Kidney disease Sister    Hypertension Sister    Lung cancer Sister    CVA Sister    Diabetes Sister       Objective:  *** General: No acute distress.  Patient appears ***-groomed.   Head:  Normocephalic/atraumatic Eyes:  Fundi examined but not visualized Neck: supple, no paraspinal tenderness, full range of motion Heart:  Regular rate and rhythm Lungs:  Clear to auscultation bilaterally Back: No paraspinal tenderness Neurological Exam: alert and oriented to person, place, and time.  Speech fluent and not dysarthric, language intact.  CN II-XII intact. Bulk and tone normal, muscle strength 5/5 throughout.  Sensation to light touch intact.  Deep tendon reflexes 2+ throughout, toes downgoing.  Finger to nose testing intact.  Gait normal, Romberg negative.   Metta Clines, DO  CC: ***

## 2022-04-11 ENCOUNTER — Encounter: Payer: Self-pay | Admitting: Neurology

## 2022-04-11 ENCOUNTER — Ambulatory Visit: Payer: Medicare Other | Admitting: Neurology

## 2022-04-11 DIAGNOSIS — Z029 Encounter for administrative examinations, unspecified: Secondary | ICD-10-CM

## 2022-04-23 ENCOUNTER — Telehealth: Payer: Self-pay | Admitting: *Deleted

## 2022-04-23 NOTE — Telephone Encounter (Signed)
   Pre-operative Risk Assessment    Patient Name: Lucas Fuller  DOB: [birth year] MRN: [id number]      Request for Surgical Clearance    Procedure:  CYSTO W/BLADDER TUMOR FLUGURATION   Date of Surgery:  Clearance 05/28/22                                 Surgeon:  DR. Lina Sar Surgeon's Group or Practice Name:  [facility] Phone number:  [phone] Fax number:  [phone]   Type of Clearance Requested:   - Pharmacy:  Hold Apixaban (Eliquis) X'S 3 DAYS   Type of Anesthesia:  Not Indicated   Additional requests/questions:    [name]   04/23/2022, 1:35 PM

## 2022-04-26 NOTE — Telephone Encounter (Signed)
Patient with diagnosis of atrial fibrillation on Eliquis for anticoagulation.    Procedure: Cysto w/bladder tumor fluguration Date of procedure: 05/28/22   CHA2DS2-VASc Score = 6   This indicates a 9.7% annual risk of stroke. The patient's score is based upon: CHF History: 1 HTN History: 1 Diabetes History: 0 Stroke History: 2 Vascular Disease History: 0 Age Score: 2 Gender Score: 0   CVA - embolic 07/28/08 secondary to AF w/o anticoagulation  CrCl 58 Platelet count 92  Urology requesting 3 days hold.  Because of CVA (embolic, not on anticoagulation), will defer to primary cardiologist if enoxaparin bridging is required.   **This guidance is not considered finalized until pre-operative APP has relayed final recommendations.**

## 2022-05-06 NOTE — Telephone Encounter (Signed)
Ok to stop eliquis 3 days prior to procedure (9 half lives) and restart when bleeding risk is acceptable. GT

## 2022-05-07 NOTE — Telephone Encounter (Signed)
   Name: Lucas Fuller  DOB: [birth year]  MRN: [id number]  Primary Cardiologist: [name], MD   Preoperative team, please contact this patient and set up a phone call appointment for further preoperative risk assessment. Please obtain consent and complete medication review. Thank you for your help.  I confirm that guidance regarding antiplatelet and oral anticoagulation therapy has been completed and, if necessary, noted below.  Per Dr. Lovena Le, patient may hold Eliquis for 3 days prior to procedure.  Please resume Eliquis as soon as possible postprocedure, at the discretion of the surgeon.   Lenna Sciara, NP 05/07/2022, 12:03 PM [place]

## 2022-05-07 NOTE — Telephone Encounter (Signed)
Left pt message to call back for tele pre op appt

## 2022-05-08 ENCOUNTER — Telehealth: Payer: Self-pay | Admitting: *Deleted

## 2022-05-08 NOTE — Telephone Encounter (Signed)
Left message x 2 for pt to cal back for tele pre op appt

## 2022-05-08 NOTE — Telephone Encounter (Signed)
DPR-pt's niece Hassan Rowan called and is agreeable to plan of care for the pt for tele pre op appt. 05/21/22. Hassan Rowan did ask if we could set up for 12:30 as she works for [employer] and she will have to go to the pt's house on her lunch break for the call top be done. Hassan Rowan states medications are still the same. Consent is done.

## 2022-05-08 NOTE — Telephone Encounter (Signed)
DPR-pt's niece Hassan Rowan called and is agreeable to plan of care for the pt for tele pre op appt. 05/21/22. Hassan Rowan did ask if we could set up for 12:30 as she works for [employer] and she will have to go to the pt's house on her lunch break for the call top be done. Hassan Rowan states medications are still the same. Consent is done.      Patient Consent for Virtual Visit        Lucas Fuller has provided verbal consent on 05/08/2022 for a virtual visit (video or telephone).   CONSENT FOR VIRTUAL VISIT FOR:  Lucas Fuller  By participating in this virtual visit I agree to the following:  I hereby voluntarily request, consent and authorize [facility] and its employed or contracted physicians, physician assistants, nurse practitioners or other licensed health care professionals (the Practitioner), to provide me with telemedicine health care services (the "Services") as deemed necessary by the treating Practitioner. I acknowledge and consent to receive the Services by the Practitioner via telemedicine. I understand that the telemedicine visit will involve communicating with the Practitioner through live audiovisual communication technology and the disclosure of certain medical information by electronic transmission. I acknowledge that I have been given the opportunity to request an in-person assessment or other available alternative prior to the telemedicine visit and am voluntarily participating in the telemedicine visit.  I understand that I have the right to withhold or withdraw my consent to the use of telemedicine in the course of my care at any time, without affecting my right to future care or treatment, and that the Practitioner or I may terminate the telemedicine visit at any time. I understand that I have the right to inspect all information obtained and/or recorded in the course of the telemedicine visit and may receive copies of available information for a reasonable fee.  I  understand that some of the potential risks of receiving the Services via telemedicine include:  Delay or interruption in medical evaluation due to technological equipment failure or disruption; Information transmitted may not be sufficient (e.g. poor resolution of images) to allow for appropriate medical decision making by the Practitioner; and/or  In rare instances, security protocols could fail, causing a breach of personal health information.  Furthermore, I acknowledge that it is my responsibility to provide information about my medical history, conditions and care that is complete and accurate to the best of my ability. I acknowledge that Practitioner's advice, recommendations, and/or decision may be based on factors not within their control, such as incomplete or inaccurate data provided by me or distortions of diagnostic images or specimens that may result from electronic transmissions. I understand that the practice of medicine is not an exact science and that Practitioner makes no warranties or guarantees regarding treatment outcomes. I acknowledge that a copy of this consent can be made available to me via my patient portal ([facility]), or I can request a printed copy by calling the office of [facility].    I understand that my insurance will be billed for this visit.   I have read or had this consent read to me. I understand the contents of this consent, which adequately explains the benefits and risks of the Services being provided via telemedicine.  I have been provided ample opportunity to ask questions regarding this consent and the Services and have had my questions answered to my satisfaction. I give my informed consent for the services to be  provided through the use of telemedicine in my medical care

## 2022-05-16 IMAGING — CT CT HEAD W/O CM
3 series · 12 of 33 positions shown, 14 images · non-contrast
Comparison: Brain MRI 06/22/2009. Head CT 06/22/2009. CTA chest
08/18/2019.

CLINICAL DATA: 80-year-old male with left side neurologic deficit,
left side numbness and neglect, left visual field cut.

EXAM:
CT HEAD WITHOUT CONTRAST
CT ANGIOGRAPHY HEAD AND NECK
TECHNIQUE: Multidetector noncontrast head CT, CTA imaging of the head and neck
was performed using the standard protocol during bolus
administration of intravenous contrast. Multiplanar CT image
reconstructions and MIPs were obtained to evaluate the vascular
anatomy. Carotid stenosis measurements (when applicable) are
obtained utilizing NASCET criteria, using the distal internal
carotid diameter as the denominator.

[Series 4: coronal · axial · 0.44mm/px · z∈[-70,+40]mm · 4 of 32 slices shown, 5 images (1 of 2)]
[im 5/32  soft-tissue]
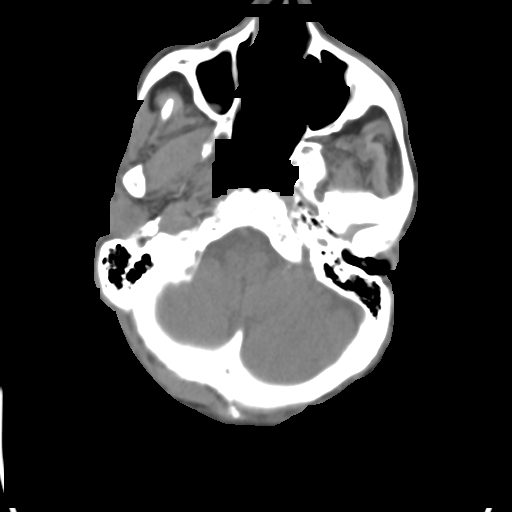
[im 5/32  bone]
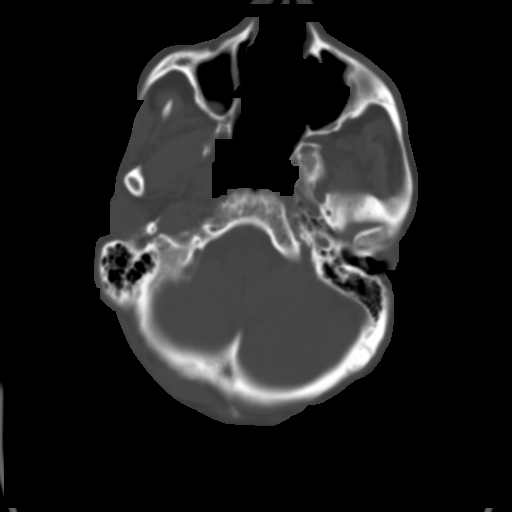
[im 12/32  bone]
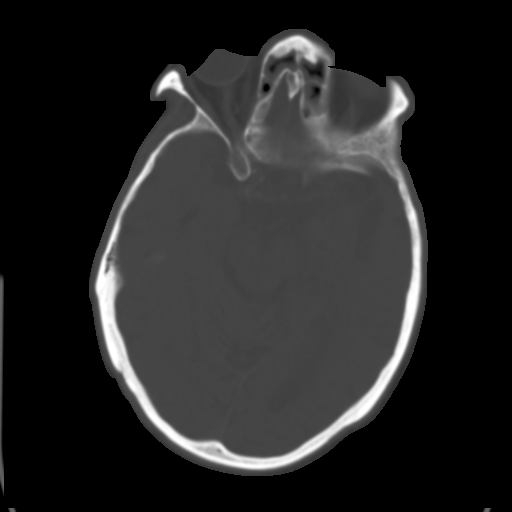
[im 20/32  bone]
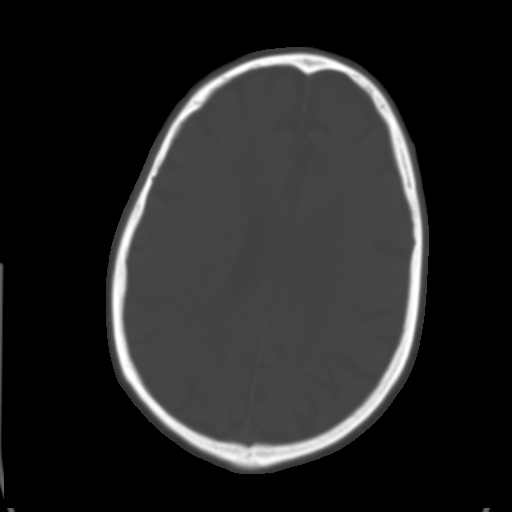
[im 27/32  bone]
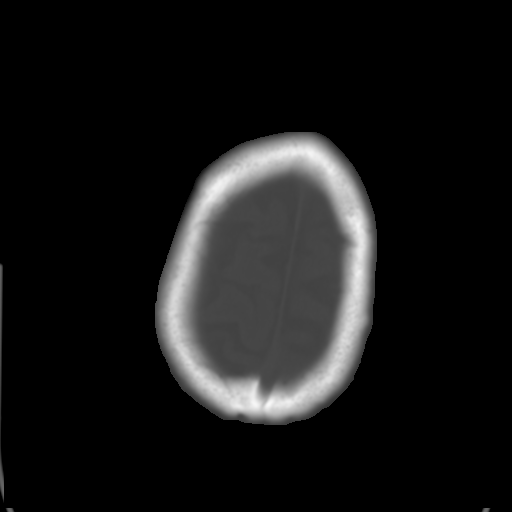

[Series 5: coronal · coronal · 0.32mm/px · 3 of 43 slices shown (2 of 2)]
[im 12/43  bone]
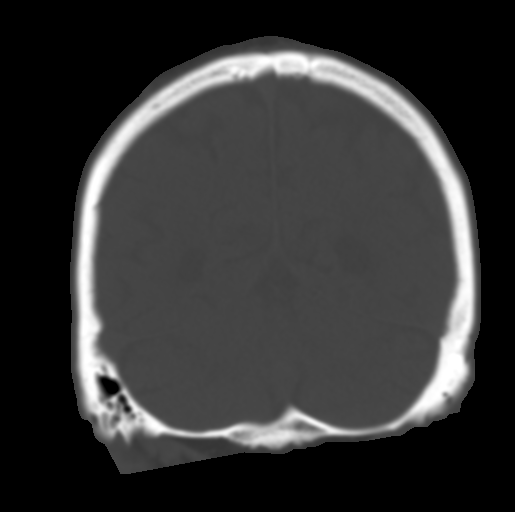
[im 18/43  bone]
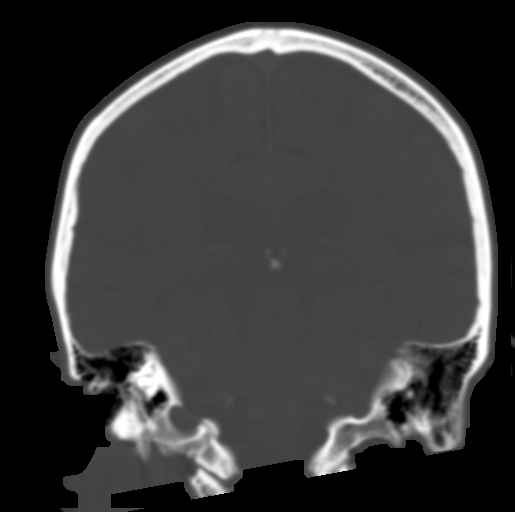
[im 25/43  bone]
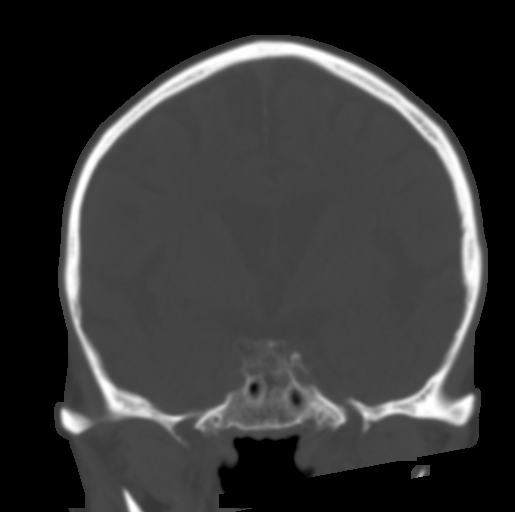

[Series 6: sagittal · sagittal · 0.31mm/px · 5 of 33 slices shown, 6 images]
[im 11/33  bone]
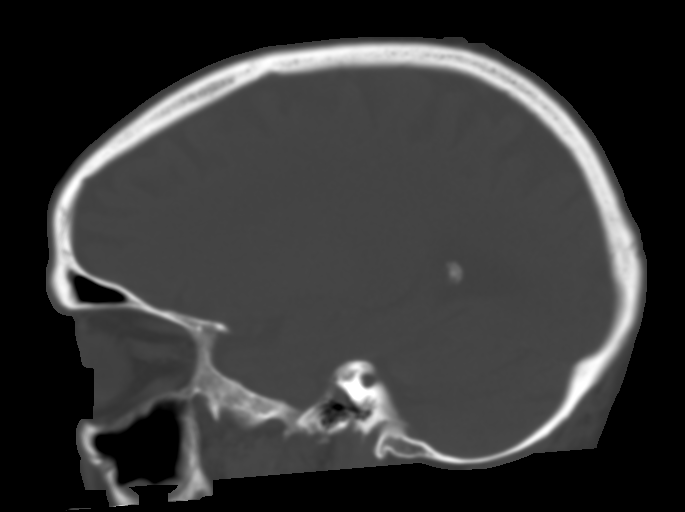
[im 14/33  bone]
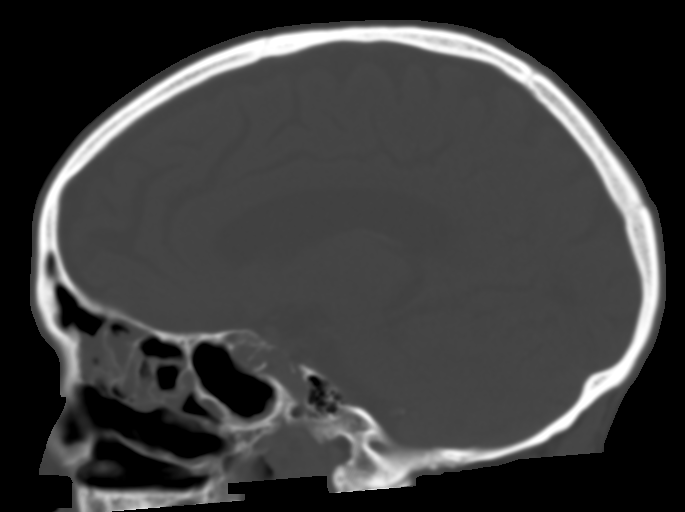
[im 17/33  soft-tissue]
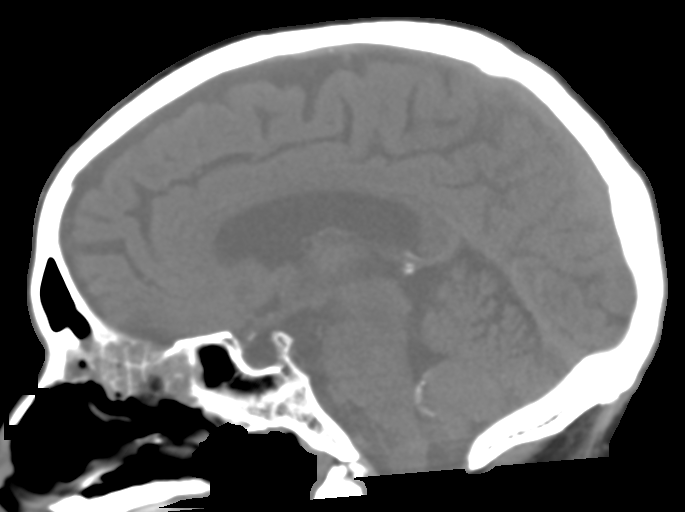
[im 17/33  bone]
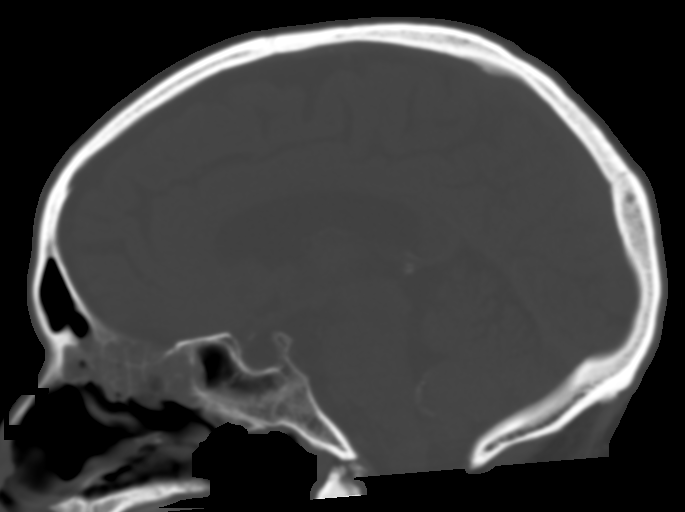
[im 19/33  bone]
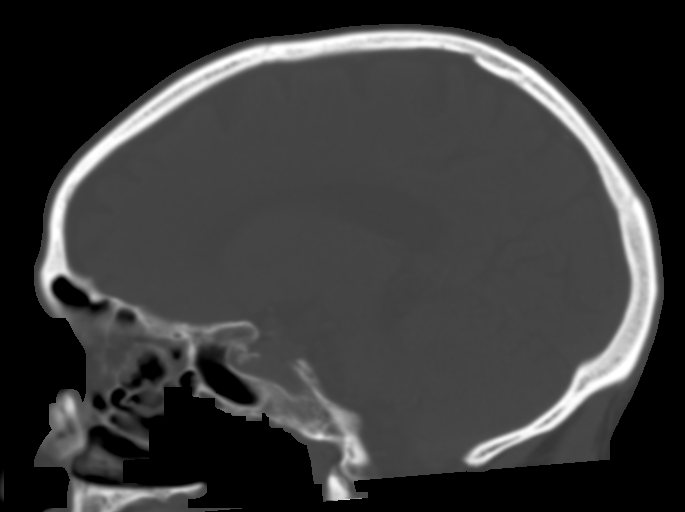
[im 22/33  bone]
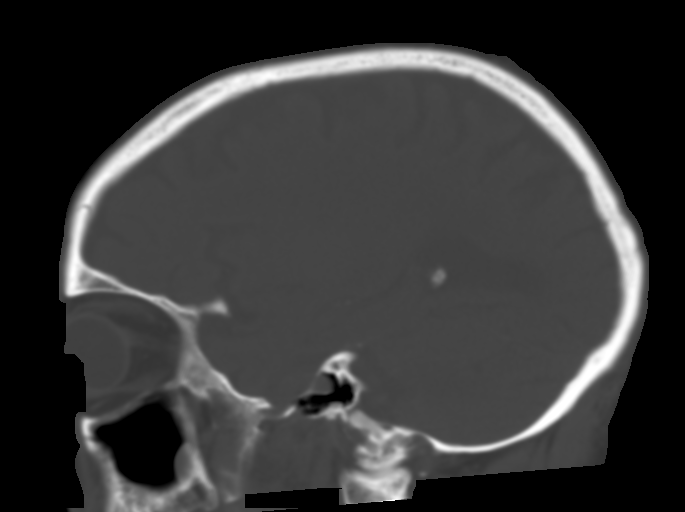

[12 of 33 positions shown; findings below may reference images not displayed]

RADIATION DOSE REDUCTION: This exam was performed according to the
departmental dose-optimization program which includes automated
exposure control, adjustment of the mA and/or kV according to
patient size and/or use of iterative reconstruction technique.

CONTRAST:  75mL OMNIPAQUE IOHEXOL 350 MG/ML SOLN
FINDINGS: CT HEAD

Brain: Small right cerebellar infarct on series 4, image 9 is likely
chronic given small size and conspicuity. Probable similar chronic
left cerebellar infarct image 7. Patchy bilateral white matter
hypodensity.

Cytotoxic edema in the posterior right MCA territory most apparent
on series 4, image 21. Right basal ganglia and insula appear spared.

No other areas of cytotoxic edema identified. ASPECTS 9 (abnormal M5
or M6).

No acute intracranial hemorrhage identified. No intracranial mass
effect or ventriculomegaly.

Calvarium and skull base: No acute osseous abnormality identified.

Paranasal sinuses: Chronic paranasal sinus disease stable to mildly
improved since 0878. Tympanic cavities and mastoids are well
aerated.

Orbits: Postoperative changes to both globes since 0878. No acute
orbit or scalp soft tissue finding.

CTA NECK

Skeleton: Absent maxillary dentition. Advanced chronic cervical
spine degeneration with multilevel cervical ankylosis. No acute
osseous abnormality identified.

Upper chest: Negative.

Other neck: Superficial dystrophic calcification 8 mm lateral to the
right submandibular gland, significance doubtful. No acute neck soft
tissue finding identified.

Aortic arch: Calcified aortic atherosclerosis. 3 vessel arch
configuration.

Right carotid system: Minimal brachiocephalic and right CCA origin
plaque without stenosis. Mild plaque at the right ICA origin and
medial bulb without stenosis.

Left carotid system: Mildly tortuous left CCA. Minimal plaque, no
stenosis.

Vertebral arteries:
Minimal plaque in the proximal right subclavian artery. Non
dominant, highly diminutive appearing proximal right vertebral
artery with atherosclerosis at its origin resulting in mild to
moderate stenosis, and some obscuration due to regional
paravertebral venous contrast reflux. But the right V2 segment is
patent. The vessel remains diminutive but patent to the skull base.

Soft and calcified plaque in the proximal left subclavian artery
without significant stenosis. Normal left vertebral artery origin.
Dominant appearing left vertebral artery with tortuous V1 segment.
Patent left vertebral to the skull base without stenosis.

CTA HEAD

Posterior circulation: Dominant left V4 segment. Normal left PICA
origin. Diminutive right V4 but continues to the vertebrobasilar
junction. Right AICA appears dominant. No significant distal
vertebral or basilar stenosis. Patent SCA origins. Fetal type
bilateral PCA origins. Bilateral PCA branches are within normal
limits.

Anterior circulation: Both ICA siphons are patent. Moderate left
siphon calcified plaque but only mild associated stenosis. Normal
left ophthalmic and posterior communicating artery origin. Right
siphon similar moderate calcified plaque with no significant
stenosis. Normal right ophthalmic and posterior communicating artery
origins.

Patent carotid termini, MCA and ACA origins. Normal anterior
communicating artery. Bilateral ACA branches are within normal
limits. Left MCA M1 segment and bifurcation are patent without
stenosis. Left MCA branches are within normal limits.

Right MCA M1 segment is patent. Right MCA bifurcation is suspected
on series 9, image 106 with a thrombosed posterior M2 branch (series
9, image 106 there. However, other proximal right MCA branches
appear patent and within normal limits. But there is a paucity of
posterior right MCA M3/M4 branches.

Venous sinuses: Early contrast timing, not well evaluated.

Anatomic variants: Dominant left vertebral artery and fetal type PCA
origins.

Review of the MIP images confirms the above findings
IMPRESSION: 1. Positive for acute to subacute posterior Right MCA territory
infarct, ASPECTS 9. No hemorrhage or mass effect.
2. Evidence of thrombosed posterior Right MCA M2 branch (series 9,
image 106) at the bifurcation with a paucity of Right MCA M3/M4
branches. Right M1 patent without stenosis.
3. But mild for age underlying atherosclerosis in the head and neck.
Some origin stenosis of the non dominant and diminutive right
vertebral artery, but no other significant atherosclerotic stenosis.
4. Small chronic appearing bilateral cerebellar infarcts.
5. Aortic Atherosclerosis ([id number]).

Study discussed by telephone with Dr. NOMASIBULELE MOATSHE on

## 2022-05-16 IMAGING — MR MR HEAD W/O CM
12 of 13 series · 44 of 48 positions shown · non-contrast
Comparison: Noncontrast head CT and CT angiogram head/neck
08/26/2021. Brain MRI 06/22/2009.

CLINICAL DATA: Provided history: Neuro deficit, acute, stroke
suspected.

EXAM:
MRI HEAD WITHOUT CONTRAST
TECHNIQUE: Multiplanar, multiecho pulse sequences of the brain and surrounding
structures were obtained without intravenous contrast.

[Series 5: DWI · axial · 3.0mm · 0.88mm/px · z∈[-86,+66]mm · 8 of 104 slices shown (1 of 4)]
[im 1/104]
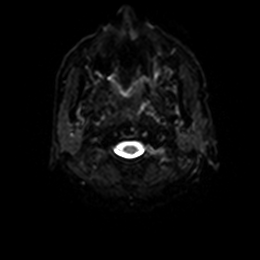
[im 15/104]
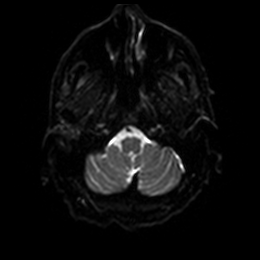
[im 30/104]
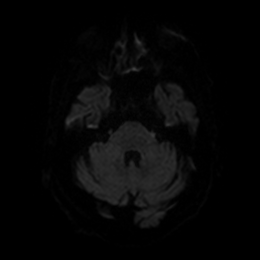
[im 45/104]
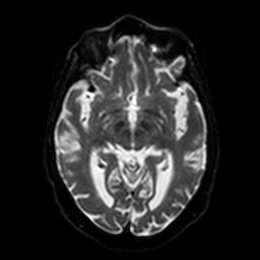
[im 59/104]
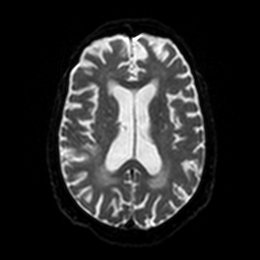
[im 74/104]
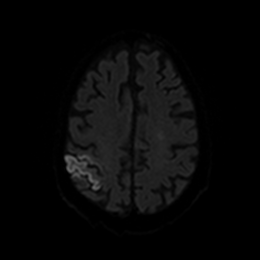
[im 89/104]
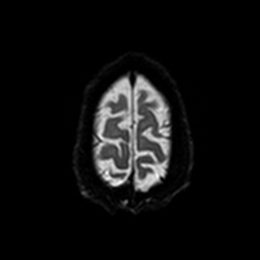
[im 104/104]
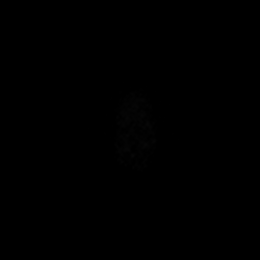

[Series 6: DWI · axial · 3.0mm · 0.88mm/px · z∈[-86,+66]mm · 4 of 52 slices shown (2 of 4)]
[im 1/52]
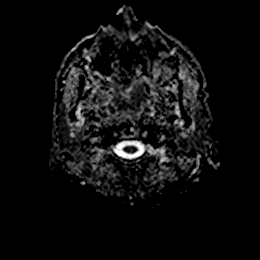
[im 18/52]
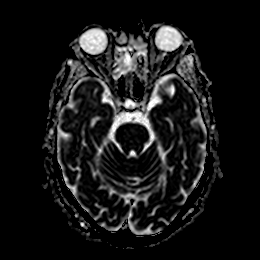
[im 35/52]
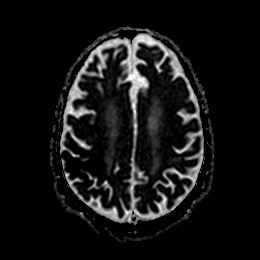
[im 52/52]
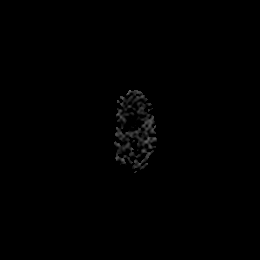

[Series 7: DWI · coronal · 4.0mm · 0.88mm/px · 6 of 78 slices shown (3 of 4)]
[im 1/78]
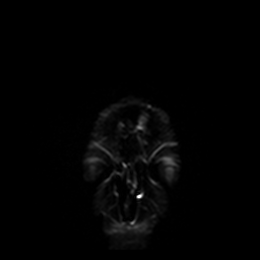
[im 16/78]
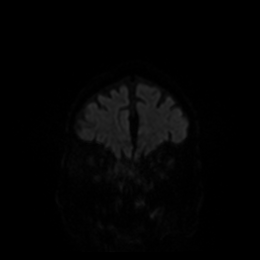
[im 31/78]
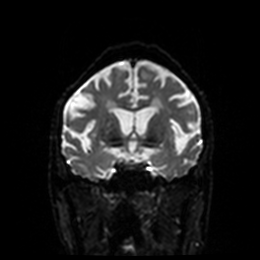
[im 47/78]
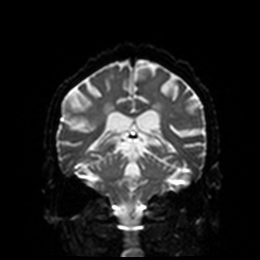
[im 62/78]
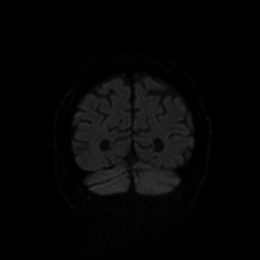
[im 78/78]
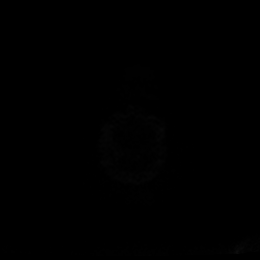

[Series 8: DWI · coronal · 4.0mm · 0.88mm/px · 3 of 39 slices shown (4 of 4)]
[im 1/39]
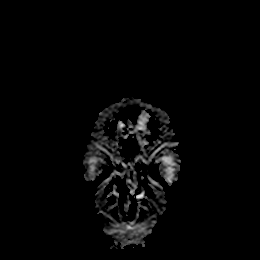
[im 20/39]
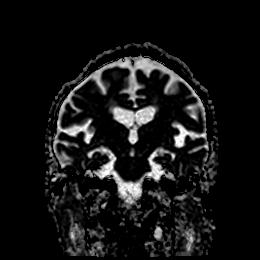
[im 39/39]
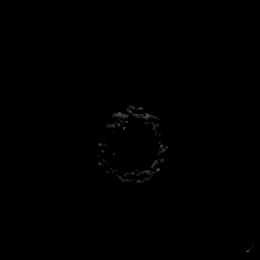

[Series 9: T1 · sagittal · 5.0mm · 0.75mm/px · 2 of 25 slices shown]
[im 1/25]
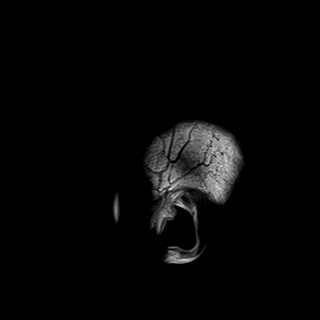
[im 25/25]
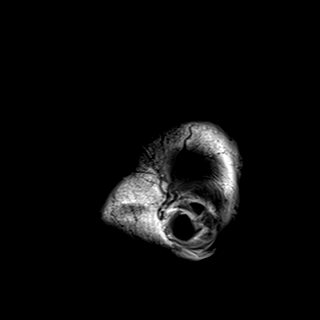

[Series 10: T2 · axial · 5.0mm · 0.72mm/px · z∈[-85,+64]mm · 2 of 26 slices shown (1 of 2)]
[im 1/26]
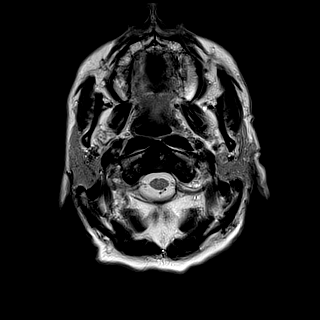
[im 26/26]
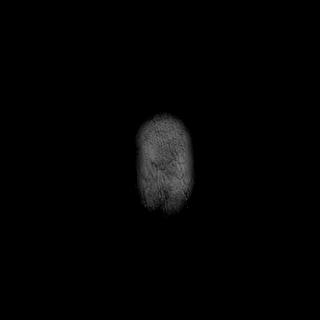

[Series 11: FLAIR · axial · 5.0mm · 0.45mm/px · z∈[-86,+64]mm · 2 of 26 slices shown]
[im 1/26]
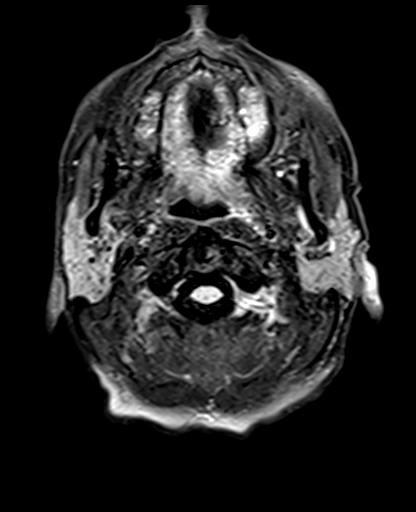
[im 26/26]
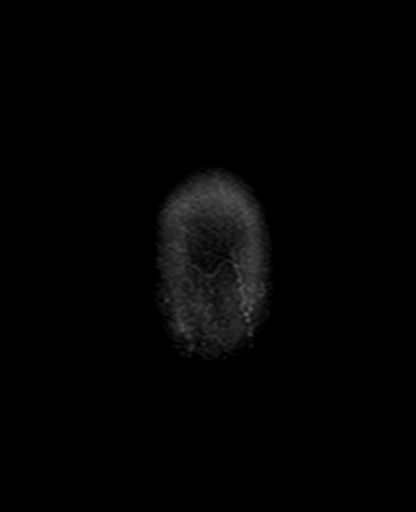

[Series 12: mag_images · axial · 3.0mm · 0.90mm/px · z∈[-87,+65]mm · 4 of 52 slices shown]
[im 1/52]
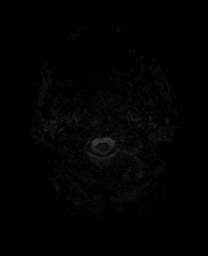
[im 18/52]
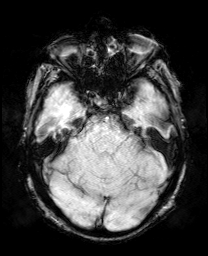
[im 35/52]
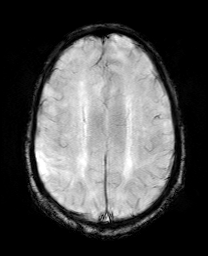
[im 52/52]
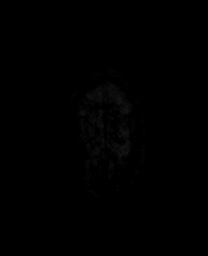

[Series 13: pha_images · axial · 3.0mm · 0.90mm/px · z∈[-87,+65]mm · 4 of 52 slices shown]
[im 1/52]
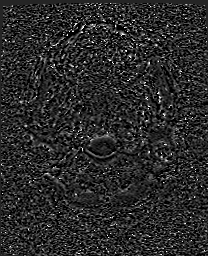
[im 18/52]
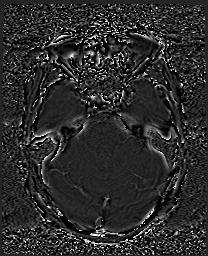
[im 35/52]
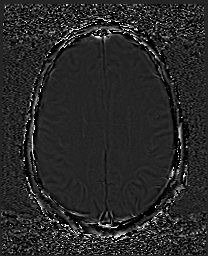
[im 52/52]
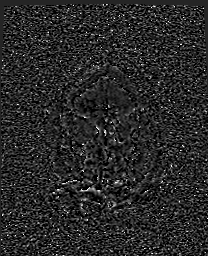

[Series 14: swi_images · axial · 3.0mm · 0.90mm/px · z∈[-87,+65]mm · 4 of 52 slices shown]
[im 1/52]
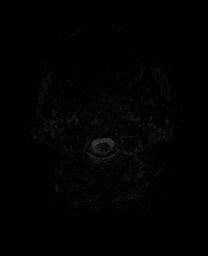
[im 18/52]
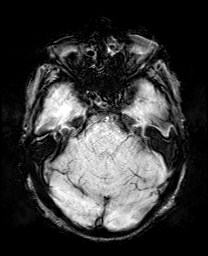
[im 35/52]
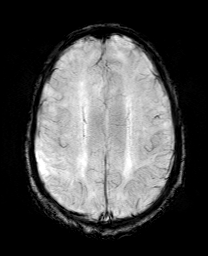
[im 52/52]
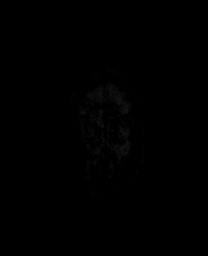

[Series 15: mip_images(sw) · axial · 24.0mm · 0.90mm/px · z∈[-77,+55]mm · 3 of 45 slices shown]
[im 1/45]
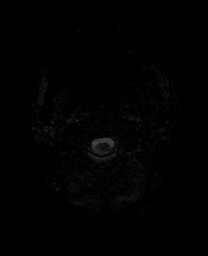
[im 23/45]
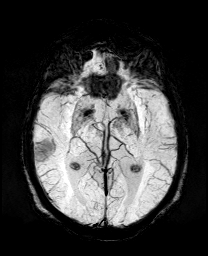
[im 45/45]
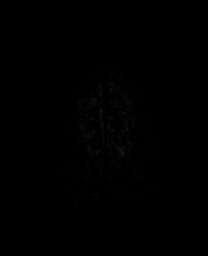

[Series 17: T2 · coronal · 5.0mm · 0.34mm/px · 2 of 32 slices shown (2 of 2)]
[im 1/32]
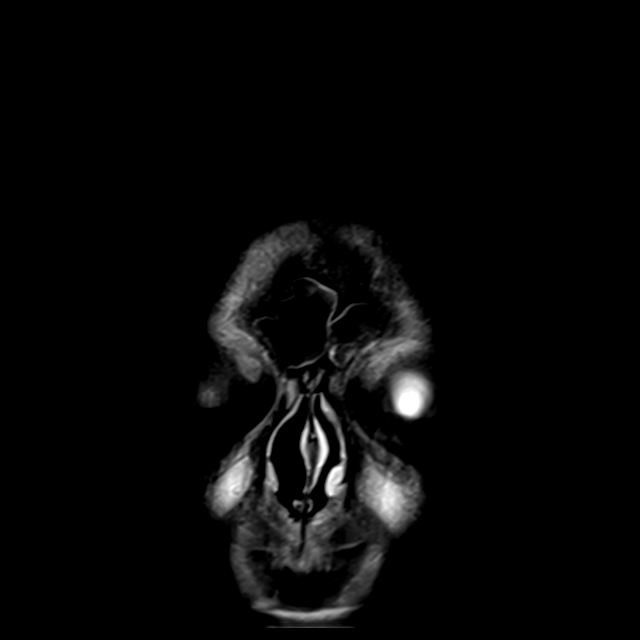
[im 32/32]
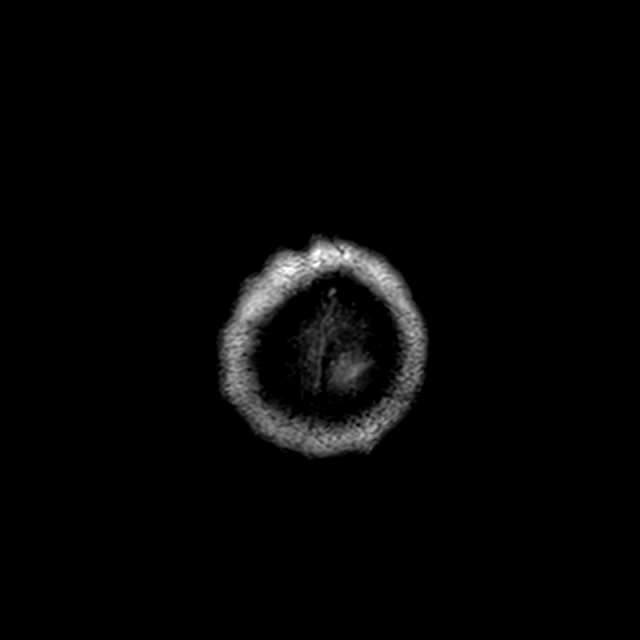

[44 of 48 positions shown; findings below may reference images not displayed]

FINDINGS: Brain:

Mild generalized cerebral and cerebellar atrophy.

Moderate-sized acute cortical/subcortical posterior right MCA
territory infarct within the right parietal lobe, right
temporoparietal junction and posterior right insula.

Separate punctate acute infarct within the right parietal lobe
(series 5, image 82).

8 mm acute/early subacute infarct within the left centrum semiovale
(series 5, image 88). Additional subcentimeter acute/early subacute
infarct within the left parietal lobe periventricular white matter
(series 5, image 84). Background moderate multifocal T2 FLAIR
hyperintense signal abnormality within the cerebral white matter,
nonspecific but compatible chronic small vessel ischemic disease.
Mild chronic small vessel ischemic changes are also present within
the pons.

There are small chronic infarcts within the bilateral cerebellar
hemispheres which are new from the prior exam.

Partially empty sella turcica.

No evidence of an intracranial mass.

No chronic intracranial blood products.

No extra-axial fluid collection.

No midline shift.

Vascular: The intracranial arterial vasculature was better
characterized on the CTA head/neck performed earlier today.

Skull and upper cervical spine: No focal suspicious marrow lesion.

Sinuses/Orbits: Visualized orbits show no acute finding. Bilateral
lens replacements. Trace mucosal thickening within the bilateral
frontal sinuses. Near complete opacification of the bilateral
ethmoid sinuses secondary to the presence of mucosal thickening and
fluid. Mild mucosal thickening small volume frothy secretions within
the left sphenoid sinus. Mild mucosal thickening within the right
sphenoid sinus. Mild mucosal thickening within the bilateral
maxillary sinuses.
IMPRESSION: 1. Moderate-sized acute cortical/subcortical posterior right MCA
territory infarct within the right parietal lobe, right
temporoparietal junction and posterior right insula.
2. Separate punctate acute infarct within the right parietal lobe.
3. 8 mm acute/early subacute infarct within the left centrum
semiovale.
4. Subcentimeter acute/early subacute infarct within the left
parietal lobe periventricular white matter.
5. Involvement of multiple vascular territories raises the
possibility of an embolic process.
6. Background moderate chronic small vessel ischemic changes within
the cerebral white matter, and to a lesser degree within the pons,
progressed from the prior brain MRI of 06/22/2009.
[DATE]. Small chronic infarcts within the bilateral cerebellar
hemispheres, new from the prior MRI.
8. Mild generalized parenchymal atrophy.
9. Paranasal sinus disease, as described.

## 2022-05-21 ENCOUNTER — Encounter: Payer: Self-pay | Admitting: Nurse Practitioner

## 2022-05-21 ENCOUNTER — Ambulatory Visit: Payer: Medicare Other | Attending: Cardiovascular Disease | Admitting: Nurse Practitioner

## 2022-05-21 DIAGNOSIS — Z01810 Encounter for preprocedural cardiovascular examination: Secondary | ICD-10-CM

## 2022-05-21 NOTE — Progress Notes (Signed)
Virtual Visit via Telephone Note   Because of Lucas Fuller's co-morbid illnesses, he is at least at moderate risk for complications without adequate follow up.  This format is felt to be most appropriate for this patient at this time.  The patient did not have access to video technology/had technical difficulties with video requiring transitioning to audio format only (telephone).  All issues noted in this document were discussed and addressed.  No physical exam could be performed with this format.  Please refer to the patient's chart for his consent to telehealth for [facility].  Evaluation Performed:  Preoperative cardiovascular risk assessment _____________   Date:  05/21/2022   Patient ID:  Lucas Fuller, DOB 08/03/1940, MRN [id number] Patient Location:  Home Provider location:   Office  Primary Care Provider:  Wenda Low, MD Primary Cardiologist:  [name], MD  Chief Complaint / Patient Profile   81 y.o. 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  Past Medical History    Past Medical History:  Diagnosis Date   Acute diastolic congestive heart failure (HCC)    Acute systolic CHF (congestive heart failure) (HCC)    Atrial fibrillation, persistent (HCC)    Atrial flutter ([place]) 12/05/2015   Bladder cancer (HCC)    Cardiomyopathy (HCC)    CHF (congestive heart failure) ([place]) 4/69/6295   Chronic systolic CHF (congestive heart failure) (HCC)    Edema 12/05/2015   Elevated brain natriuretic peptide (BNP) level    Essential hypertension 06/24/2018   ETOH abuse 12/05/2015   History of blood transfusion    Hypothyroidism    Lumbar disc disease 12/05/2015   Malnutrition of moderate degree 12/07/2015    Migraine    "years ago" (05/17/2016)   Non-ischemic cardiomyopathy (HCC)    Paroxysmal atrial fibrillation (HCC)    Pneumonia due to COVID-19 virus 08/18/2019   Protein calorie malnutrition ([place]) 12/05/2015   SOB (shortness of breath) 12/05/2015   Thrombocytopenia ([place]) 12/05/2015   Thyroid disease    Weight loss 12/05/2015   Past Surgical History:  Procedure Laterality Date   CARDIAC CATHETERIZATION N/A 12/08/2015   Procedure: Left Heart Cath and Coronary Angiography;  Surgeon: Lorretta Harp, MD;  Location: [facility];  Service: Cardiovascular;  Laterality: N/A;   CYSTOSCOPY WITH FULGERATION  2008   Archie Endo 11/23/2010   ELECTROPHYSIOLOGIC STUDY N/A 05/17/2016   Procedure: A-Flutter Ablation;  Surgeon: Evans Lance, MD;  Location: [facility];  Service: Cardiovascular;  Laterality: N/A;   TONSILLECTOMY     TRANSURETHRAL RESECTION OF BLADDER  2008   Archie Endo 11/23/2010    Allergies  Allergies  Allergen Reactions   Asa [Aspirin] Hives    Pt can tolerate 81 mg with no issues    History of Present Illness     HFrEF (LVEF 30-35% 10/2021) NICM Hx of stroke (08/26/21 while not on [place]) PAF Hypothyroidism History of bladder cancer Orthostatic syncope   He presents today via audio/video conferencing for a telehealth visit today. He was last seen in the cardiology clinic on 01/03/81 by Dr. Lovena Le. He had an episode of orthostatic syncope at 81 years and did not report any recurrent episodes of syncope at last visit.  It was noted that he had a stroke in February  of this year while he was not on his oral anticoagulation.  He had a nice recovery after his stroke, his hand strength had returned back to normal.  He was pending urological surgery and Dr. Lovena Le noted he was at an acceptable risk.  Dr. Lovena Le stated at that time he may come off his systemic anticoagulation for 2 to 3 days and restart when the bleeding risk is acceptable.  He was seen in the heart failure clinic on Nov 21, 2021 as he was admitted on November 08, 2021 with acute on chronic heart failure exacerbation, BNP 1158.  Was diuresed with IV Lasix and transition to Lasix 40 mg daily.  At that office visit with the heart failure clinic, he reported feeling fine, did note some shortness of breath with exertion but stated did okay when he took his time.  Denied any orthopnea or PND, stated he was able to drive but niece drove him to medical appointments.  Today he presents for telephonic visit for preoperative cardiovascular risk assessment for upcoming cystoscopy with bladder tumor fluguration.  This procedure will be performed by Dr. Ellison Hughs of [facility].  This group is requesting to hold Eliquis for 3 days prior to procedure.  Date of surgery will be May 28, 2022. Today the HPI was obtained from patient's niece Lucas Fuller) who is listed on patient's DPR.  Since last cardiology visit, she said overall he is doing very well.  No chief complaints or concerns.  She denies that he has had any recent chest pain, shortness of breath, palpitations, syncope, presyncope, dizziness, bleeding, orthopnea, PND, swelling, or claudication.  Niece is calling from work so she is not nearby the patient at the time.  Denies any questions or concerns today.    Home Medications    Prior to Admission medications   Medication Sig Start Date End Date Taking? Authorizing Provider  acetaminophen (TYLENOL) 500 MG tablet Take 1,000 mg by mouth every 6 (six) hours as needed for moderate pain or mild pain.    [provider]  albuterol (VENTOLIN HFA) 108 (90 Base) MCG/ACT inhaler Inhale 2 puffs into the lungs every 4 (four) hours as needed for wheezing or shortness of breath. 08/22/19   Hosie Poisson, MD  apixaban (ELIQUIS) 5 MG TABS tablet Take 1 tablet (5 mg total) by mouth 2 (two) times daily. 08/27/21   Patrecia Pour, MD  atorvastatin (LIPITOR) 20 MG tablet Take 1 tablet (20 mg total) by mouth daily. 11/21/21    Clegg, Amy D, NP  carvedilol (COREG) 3.125 MG tablet Take 1 tablet by mouth in the morning and at bedtime.    [provider]  furosemide (LASIX) 40 MG tablet Take 1 tablet (40 mg total) by mouth daily. 12/18/19   Baldwin Jamaica, PA-C  levothyroxine (SYNTHROID, LEVOTHROID) 50 MCG tablet Take 50 mcg by mouth daily before breakfast.    [provider]  lisinopril (ZESTRIL) 2.5 MG tablet TAKE 1 TABLET BY MOUTH EVERY DAY 01/29/22   Evans Lance, MD  potassium chloride (KLOR-CON) 20 MEQ packet Take 20 mEq by mouth daily. 12/04/21   [provider]  spironolactone (ALDACTONE) 25 MG tablet Take 0.5 tablets (12.5 mg total) by mouth daily. Patient taking differently: Take 25 mg by mouth daily. 11/10/21 05/08/22  Bonnell Public, MD    Physical Exam    Vital Signs:  JAHKING LESSER does not have vital signs available for review today.  Given telephonic nature of  communication, unable to perform physical exam.  Accessory Clinical Findings    None  Assessment & Plan    1.  Preoperative Cardiovascular Risk Assessment:  The patient's niece was advised that if he develops new symptoms prior to surgery to contact our office to arrange for a follow-up visit, and she verbalized understanding.  Mr. Dubray perioperative risk of a major cardiac event is 11% according to the Revised Cardiac Risk Index (RCRI).  Therefore, he is at high risk for perioperative complications.   His functional capacity is good at 5.81 METs according to the [employer] Activity Status Index (DASI). Recommendations: The patient requires an echocardiogram before a disposition can be made regarding surgical risk.     Once echo comes back stable or WNL, patient is an acceptable risk to proceed with surgery.           Antiplatelet and/or Anticoagulation Recommendations: Patient is not on any antiplatelet due to being on Eliquis. Eliquis (Apixaban) can be held for 3 days prior to surgery, according to Dr.  Tanna Furry recommendations. Please resume post op when felt to be safe.   This patient does not require SBE prophylaxis. A copy of this note will be routed to requesting surgeon.   Once echo comes back stable or WNL, patient is an acceptable risk to proceed with surgery from a cardiac standpoint.  [name] was also advised for neurology to weigh in on holding Eliquis.  [name] verbalized understanding regarding this information.  Will route note to requesting surgeon once Echo results are stable or WNL.  Time:   Today, I have spent 10 minutes with the patient's niece Lucas Fuller) with telehealth technology discussing medical history, symptoms, and management plan.     Finis Bud, NP  05/21/2022, 11:35 AM

## 2022-05-21 NOTE — Telephone Encounter (Signed)
Pt has echo 05/24/22

## 2022-05-21 NOTE — Addendum Note (Signed)
Addended by: Michae Kava on: 05/21/2022 01:43 PM   Modules accepted: Orders

## 2022-05-21 NOTE — Progress Notes (Deleted)
{Choose 1 Note Type (Video or Telephone):[phone]}    Date:  05/21/2022   ID:  RANALD Fuller, DOB [birth year], MRN [id number] The patient was identified using 2 identifiers.  {Patient Location:[phone]::"Home"} {Provider [phone]::"Home Office"}   PCP:  Wenda Low, [place] Prov[facility] Cardiologist:  [name], MD { Click to update primary MD,subspecialty MD or APP then REFRESH:1}    Evaluation Performed:  {Choose Visit Type:[phone]::"Follow-Up Visit"}  Chief Complaint:  ***  History of Present Illness:    Lucas Fuller is a 81 y.o. male with ***  HFrEF (LVEF 30-35% 10/2021) NICM Hx of stroke (08/26/21 while not on [place]) PAF Hypothyroidism History of bladder cancer Orthostatic syncope   He presents today via audio/video conferencing for a telehealth visit today. He was last seen in the cardiology clinic on 01/03/22 by Dr. Lovena Le. He had an episode of orthostatic syncope last year and it not reported any recurrent episodes of syncope.  It was noted that he had a stroke in February of this year while he was not on his oral anticoagulation.  He had a nice recovery after his stroke, his hand strength had returned back to normal.  He was pending urological surgery and Dr. Lovena Le noted he was at an acceptable risk.  Dr. Lovena Le stated at that time he may come off his systemic anticoagulation for 2 to 3 days and restart when the bleeding risk is acceptable.  He was seen in the heart failure clinic on Nov 21, 2021 as he was admitted on November 08, 2021 with acute on chronic heart failure exacerbation, BNP 1158.  Was diuresed with IV Lasix and transition to Lasix 40 mg daily.  At that office visit with the heart failure clinic, he reported feeling fine, did note some shortness of breath with exertion but stated did okay when he took his time.  Denied any orthopnea or PND, stated he was able to drive but niece drove him to medical appointments.  Today he  presents for telehealth visit for preoperative cardiovascular risk assessment for upcoming cystoscopy with bladder tumor fluguration.  This procedure will be performed by Dr. Lina Sar of [facility].  This group is requesting to hold Eliquis for 3 days prior to procedure.  Date of surgery will be May 28, 2022.    Today he states***    Rcri Dasi     Past Medical History:  Diagnosis Date   Acute diastolic congestive heart failure (HCC)    Acute systolic CHF (congestive heart failure) (HCC)    Atrial fibrillation, persistent (HCC)    Atrial flutter ([place]) 12/05/2015   Bladder cancer (HCC)    Cardiomyopathy (HCC)    CHF (congestive heart failure) ([place]) 03/16/538   Chronic systolic CHF (congestive heart failure) (HCC)    Edema 12/05/2015   Elevated brain natriuretic peptide (BNP) level    Essential hypertension 06/24/2018   ETOH abuse 12/05/2015   History of blood transfusion    Hypothyroidism    Lumbar disc disease 12/05/2015   Malnutrition of moderate degree 12/07/2015   Migraine    "years ago" (05/17/2016)   Non-ischemic cardiomyopathy ([place])    Paroxysmal atrial fibrillation ([place])    Pneumonia due to COVID-19 virus 08/18/2019   Protein calorie malnutrition ([place]) 12/05/2015   SOB (shortness of breath) 12/05/2015   Thrombocytopenia ([place]) 12/05/2015   Thyroid disease    Weight loss 12/05/2015   Past Surgical History:  Procedure Laterality Date   CARDIAC CATHETERIZATION N/A 12/08/2015  Procedure: Left Heart Cath and Coronary Angiography;  Surgeon: Lorretta Harp, MD;  Location: [place] [facility];  Service: Cardiovascular;  Laterality: N/A;   CYSTOSCOPY WITH FULGERATION  2008   Archie Endo 11/23/2010   ELECTROPHYSIOLOGIC STUDY N/A 05/17/2016   Procedure: A-Flutter Ablation;  Surgeon: Evans Lance, MD;  Location: [place] [facility];  Service: Cardiovascular;  Laterality: N/A;   TONSILLECTOMY     TRANSURETHRAL RESECTION OF BLADDER  2008   Archie Endo 11/23/2010     No outpatient  medications have been marked as taking for the 05/21/22 encounter (Appointment) with [facility] PRE OP CLEARANCE APP.     Allergies:   Asa [aspirin]   Social History   Tobacco Use   Smoking status: Never   Smokeless tobacco: Never  Vaping Use   Vaping Use: Never used  Substance Use Topics   Alcohol use: Not Currently    Alcohol/week: 6.0 standard drinks of alcohol    Types: 6 Cans of beer per week    Comment: 05/17/2016 "stopped drinking ~ 09/2015"     Family Hx: The patient's family history includes CVA in his sister; Diabetes in his sister; Heart attack (age of onset: 66) in his father; Hypertension in his sister; Kidney disease in his sister; Lung cancer in his sister.  ROS:   Please see the history of present illness.    *** All other systems reviewed and are negative.   Prior CV studies:   The following studies were reviewed today:  ***  Labs/Other Tests and Data Reviewed:    EKG:  {EKG/Telemetry Strips Reviewed:[phone]}  Recent Labs: 08/26/2021: TSH 2.762 11/08/2021: B Natriuretic Peptide 1,158.1 11/09/2021: ALT 20; Hemoglobin 13.2; Magnesium 1.6; Platelets 92 11/21/2021: BUN 19; Creatinine, Ser 1.02; Potassium 4.6; Sodium 140   Recent Lipid Panel Lab Results  Component Value Date/Time   CHOL 160 08/26/2021 10:47 AM   TRIG 71 08/26/2021 10:47 AM   HDL 75 08/26/2021 10:47 AM   CHOLHDL 2.1 08/26/2021 10:47 AM   LDLCALC 71 08/26/2021 10:47 AM    Wt Readings from Last 3 Encounters:  01/03/22 163 lb (73.9 kg)  11/21/21 165 lb (74.8 kg)  11/09/21 163 lb 6.4 oz (74.1 kg)     Risk Assessment/Calculations:   {Does this patient have ATRIAL FIBRILLATION?:[id number]}      Objective:    Vital Signs:  There were no vitals taken for this visit.   {[facility] Virtual Exam (Optional):[phone]::"VITAL SIGNS:  reviewed"}  ASSESSMENT & PLAN:    ***      {Are you ordering a CV Procedure (e.g. stress test, cath, DCCV, TEE, etc)?   Press F2        :[phone]}      Time:   Today, I have spent *** minutes with the patient with telehealth technology discussing the above problems.     Medication Adjustments/Labs and Tests Ordered: Current medicines are reviewed at length with the patient today.  Concerns regarding medicines are outlined above.   Tests Ordered: No orders of the defined types were placed in this encounter.   Medication Changes: No orders of the defined types were placed in this encounter.   Follow Up:  {F/U Format:[phone]} {follow up:15908}  Signed, Finis Bud, NP  05/21/2022 8:16 AM    [facility]

## 2022-05-21 NOTE — Telephone Encounter (Signed)
See message from Melene Plan for echo:  I called the HM# and NVM and I tried the cell number and had to leave a message of the appt.

## 2022-05-23 ENCOUNTER — Telehealth: Payer: Self-pay | Admitting: Internal Medicine

## 2022-05-23 NOTE — Telephone Encounter (Signed)
I called Patti with at [facility] and informed her of the  hold time for Eliquis for the pt x 3 days. I will re-fax notes to [facility] to fax # [phone].

## 2022-05-23 NOTE — Telephone Encounter (Signed)
Follow Up:      Precious Bard is calling back to find out patient holding his Eliquis. His procedure is Monday(05-28-22). Please fax to [phone].

## 2022-05-23 NOTE — Telephone Encounter (Signed)
I s/w the pt and informed him per the pre op provider that he does not need to have the repeat echo tomorrow. Pt thanked me for the call and the help. I will cancel the echo.

## 2022-05-24 ENCOUNTER — Other Ambulatory Visit (HOSPITAL_COMMUNITY): Payer: Medicare Other

## 2022-05-24 ENCOUNTER — Telehealth: Payer: Self-pay | Admitting: Nurse Practitioner

## 2022-05-24 NOTE — Telephone Encounter (Signed)
Spoke with patients niece Hassan Rowan) and made her aware that patient does not need echo. She was thankful for the call

## 2022-05-24 NOTE — Telephone Encounter (Signed)
Follow Up:     [name] is calling back to see why Echo was cancelled and what is the correct date of his procedure?

## 2022-06-08 DIAGNOSIS — C679 Malignant neoplasm of bladder, unspecified: Secondary | ICD-10-CM | POA: Diagnosis not present

## 2022-06-18 ENCOUNTER — Emergency Department (HOSPITAL_COMMUNITY)
Admission: EM | Admit: 2022-06-18 | Discharge: 2022-06-19 | Discharge status: Against Medical Advice | Payer: Medicare Other | Attending: Emergency Medicine | Admitting: Emergency Medicine

## 2022-06-18 ENCOUNTER — Other Ambulatory Visit: Payer: Self-pay

## 2022-06-18 ENCOUNTER — Emergency Department (HOSPITAL_COMMUNITY): Payer: Medicare Other

## 2022-06-18 DIAGNOSIS — R0689 Other abnormalities of breathing: Secondary | ICD-10-CM | POA: Diagnosis not present

## 2022-06-18 DIAGNOSIS — R519 Headache, unspecified: Secondary | ICD-10-CM | POA: Insufficient documentation

## 2022-06-18 DIAGNOSIS — Z5321 Procedure and treatment not carried out due to patient leaving prior to being seen by health care provider: Secondary | ICD-10-CM | POA: Insufficient documentation

## 2022-06-18 DIAGNOSIS — F10129 Alcohol abuse with intoxication, unspecified: Secondary | ICD-10-CM | POA: Diagnosis not present

## 2022-06-18 DIAGNOSIS — R6889 Other general symptoms and signs: Secondary | ICD-10-CM | POA: Diagnosis not present

## 2022-06-18 DIAGNOSIS — G4489 Other headache syndrome: Secondary | ICD-10-CM | POA: Diagnosis not present

## 2022-06-18 DIAGNOSIS — T699XXA Effect of reduced temperature, unspecified, initial encounter: Secondary | ICD-10-CM | POA: Diagnosis not present

## 2022-06-18 DIAGNOSIS — Z743 Need for continuous supervision: Secondary | ICD-10-CM | POA: Diagnosis not present

## 2022-06-18 LAB — CBC WITH DIFFERENTIAL/PLATELET
Abs Immature Granulocytes: 0.01 K/uL (ref 0.00–0.07)
Basophils Absolute: 0 K/uL (ref 0.0–0.1)
Basophils Relative: 1 %
Eosinophils Absolute: 0.2 K/uL (ref 0.0–0.5)
Eosinophils Relative: 4 %
HCT: 38.9 % — ABNORMAL LOW (ref 39.0–52.0)
Hemoglobin: 13.2 g/dL (ref 13.0–17.0)
Immature Granulocytes: 0 %
Lymphocytes Relative: 25 %
Lymphs Abs: 1.4 K/uL (ref 0.7–4.0)
MCH: 33.8 pg (ref 26.0–34.0)
MCHC: 33.9 g/dL (ref 30.0–36.0)
MCV: 99.5 fL (ref 80.0–100.0)
Monocytes Absolute: 0.8 K/uL (ref 0.1–1.0)
Monocytes Relative: 15 %
Neutro Abs: 3 K/uL (ref 1.7–7.7)
Neutrophils Relative %: 55 %
Platelets: 116 K/uL — ABNORMAL LOW (ref 150–400)
RBC: 3.91 MIL/uL — ABNORMAL LOW (ref 4.22–5.81)
RDW: 14.7 % (ref 11.5–15.5)
WBC: 5.5 K/uL (ref 4.0–10.5)
nRBC: 0 % (ref 0.0–0.2)

## 2022-06-18 LAB — BASIC METABOLIC PANEL WITH GFR
Anion gap: 10 (ref 5–15)
BUN: 17 mg/dL (ref 8–23)
CO2: 21 mmol/L — ABNORMAL LOW (ref 22–32)
Calcium: 8.6 mg/dL — ABNORMAL LOW (ref 8.9–10.3)
Chloride: 106 mmol/L (ref 98–111)
Creatinine, Ser: 0.98 mg/dL (ref 0.61–1.24)
GFR, Estimated: >60 mL/min
Glucose, Bld: 101 mg/dL — ABNORMAL HIGH (ref 70–99)
Potassium: 3.9 mmol/L (ref 3.5–5.1)
Sodium: 137 mmol/L (ref 135–145)

## 2022-06-18 LAB — ETHANOL: Alcohol, Ethyl (B): <10 mg/dL (ref <10)

## 2022-06-18 MED ORDER — ACETAMINOPHEN 325 MG PO TABS
650.0000 mg | ORAL_TABLET | Freq: Once | ORAL | Status: AC
  Administered 2022-06-18: 650 mg via ORAL
  Filled 2022-06-18: qty 2

## 2022-06-18 NOTE — ED Triage Notes (Signed)
BIB EMS from home.  Complaints of headache.  Has been drinking heiniken x 4 days. No deficits. Niece went over today and found him intoxicated and complaint of headache. Called EMS

## 2022-06-18 NOTE — ED Notes (Signed)
Niece Hassan Rowan [phone] would like an update asap

## 2022-06-18 NOTE — ED Provider Triage Note (Signed)
Emergency Medicine Provider Triage Evaluation Note  Lucas Fuller , a 81 y.o. male  was evaluated in triage.  Pt complains of has been drinking heavily over the past 4 days.  History of alcoholism and history of stroke.  Complaining of bad headache.  Niece found him today drinking in his recliner and called EMS no obvious neurodeficits.  Review of Systems  Positive: HA Negative: fever  Physical Exam  There were no vitals taken for this visit. Gen:   Awake, no distress   Resp:  Normal effort  MSK:   Moves extremities without difficulty  Other:  No facial droop  Medical Decision Making  Medically screening exam initiated at 6:20 PM.  Appropriate orders placed.  Lucas Fuller was informed that the remainder of the evaluation will be completed by another provider, this initial triage assessment does not replace that evaluation, and the importance of remaining in the ED until their evaluation is complete.     Margarita Mail, PA-C 06/18/22 Vernelle Emerald

## 2022-06-18 NOTE — ED Notes (Signed)
Pt called for triage, no answer

## 2022-06-19 NOTE — ED Notes (Signed)
Pt name called 3x for updated vitals, no response.

## 2022-06-20 DIAGNOSIS — I10 Essential (primary) hypertension: Secondary | ICD-10-CM | POA: Diagnosis not present

## 2022-06-20 DIAGNOSIS — E039 Hypothyroidism, unspecified: Secondary | ICD-10-CM | POA: Diagnosis not present

## 2022-06-20 DIAGNOSIS — E7800 Pure hypercholesterolemia, unspecified: Secondary | ICD-10-CM | POA: Diagnosis not present

## 2022-06-20 DIAGNOSIS — I5022 Chronic systolic (congestive) heart failure: Secondary | ICD-10-CM | POA: Diagnosis not present

## 2022-06-24 ENCOUNTER — Encounter (HOSPITAL_COMMUNITY): Payer: Self-pay

## 2022-06-24 ENCOUNTER — Emergency Department (HOSPITAL_COMMUNITY)
Admission: EM | Admit: 2022-06-24 | Discharge: 2022-06-25 | Discharge status: Against Medical Advice | Payer: Medicare Other | Attending: Emergency Medicine | Admitting: Emergency Medicine

## 2022-06-24 ENCOUNTER — Emergency Department (HOSPITAL_COMMUNITY): Payer: Medicare Other

## 2022-06-24 DIAGNOSIS — R0602 Shortness of breath: Secondary | ICD-10-CM | POA: Diagnosis not present

## 2022-06-24 DIAGNOSIS — R0689 Other abnormalities of breathing: Secondary | ICD-10-CM | POA: Diagnosis not present

## 2022-06-24 DIAGNOSIS — Z743 Need for continuous supervision: Secondary | ICD-10-CM | POA: Diagnosis not present

## 2022-06-24 DIAGNOSIS — Z5321 Procedure and treatment not carried out due to patient leaving prior to being seen by health care provider: Secondary | ICD-10-CM | POA: Insufficient documentation

## 2022-06-24 DIAGNOSIS — R600 Localized edema: Secondary | ICD-10-CM | POA: Insufficient documentation

## 2022-06-24 DIAGNOSIS — T699XXA Effect of reduced temperature, unspecified, initial encounter: Secondary | ICD-10-CM | POA: Diagnosis not present

## 2022-06-24 DIAGNOSIS — R6889 Other general symptoms and signs: Secondary | ICD-10-CM | POA: Diagnosis not present

## 2022-06-24 LAB — CBC
HCT: 45 % (ref 39.0–52.0)
Hemoglobin: 15.5 g/dL (ref 13.0–17.0)
MCH: 34.4 pg — ABNORMAL HIGH (ref 26.0–34.0)
MCHC: 34.4 g/dL (ref 30.0–36.0)
MCV: 100 fL (ref 80.0–100.0)
Platelets: 124 10*3/uL — ABNORMAL LOW (ref 150–400)
RBC: 4.5 MIL/uL (ref 4.22–5.81)
RDW: 14.8 % (ref 11.5–15.5)
WBC: 6.5 10*3/uL (ref 4.0–10.5)
nRBC: 0 % (ref 0.0–0.2)

## 2022-06-24 LAB — COMPREHENSIVE METABOLIC PANEL
ALT: 12 U/L (ref 0–44)
AST: 33 U/L (ref 15–41)
Albumin: 3.8 g/dL (ref 3.5–5.0)
Alkaline Phosphatase: 81 U/L (ref 38–126)
Anion gap: 11 (ref 5–15)
BUN: 9 mg/dL (ref 8–23)
CO2: 25 mmol/L (ref 22–32)
Calcium: 8.6 mg/dL — ABNORMAL LOW (ref 8.9–10.3)
Chloride: 100 mmol/L (ref 98–111)
Creatinine, Ser: 1.16 mg/dL (ref 0.61–1.24)
GFR, Estimated: >60 mL/min (ref >60)
Glucose, Bld: 93 mg/dL (ref 70–99)
Potassium: 3.7 mmol/L (ref 3.5–5.1)
Sodium: 136 mmol/L (ref 135–145)
Total Bilirubin: 1.3 mg/dL — ABNORMAL HIGH (ref 0.3–1.2)
Total Protein: 8 g/dL (ref 6.5–8.1)

## 2022-06-24 LAB — BRAIN NATRIURETIC PEPTIDE: B Natriuretic Peptide: 14.1 pg/mL (ref 0.0–100.0)

## 2022-06-24 LAB — TROPONIN I (HIGH SENSITIVITY): Troponin I (High Sensitivity): 71 ng/L — ABNORMAL HIGH (ref <18)

## 2022-06-24 NOTE — ED Provider Triage Note (Signed)
Emergency Medicine Provider Triage Evaluation Note  THEODIS KINSEL , a 81 y.o. male  was evaluated in triage.  Pt complains of shortness of breath, worse with exertion over the past several days.  He admits to not taking his medications, including blood thinner and Lasix, as he should.  Denies cough.  He has had some mild lower extremity swelling.  No chest pain.  No fevers.  Review of Systems  Positive: Shortness of breath, leg edema Negative: Fever  Physical Exam  BP 139/84 (BP Location: Right Arm)   Pulse 82   Temp 97.6 F (36.4 C)   Resp 18   SpO2 98%  Gen:   Awake, no distress   Resp:  Normal effort  MSK:   Moves extremities without difficulty  Other:  Trace pedal and ankle edema  Medical Decision Making  Medically screening exam initiated at 9:18 PM.  Appropriate orders placed.  VAIL VUNCANNON was informed that the remainder of the evaluation will be completed by another provider, this initial triage assessment does not replace that evaluation, and the importance of remaining in the ED until their evaluation is complete.     Carlisle Cater, PA-C 06/24/22 2119

## 2022-06-24 NOTE — ED Triage Notes (Signed)
Pt comes from home via [facility] for SOB that has been going on for the past several days

## 2022-06-25 NOTE — ED Notes (Signed)
Patient stated he was leaving that he didn't want to wait any longer. He stated he will go see his doctor in morning.

## 2022-06-26 DIAGNOSIS — I5022 Chronic systolic (congestive) heart failure: Secondary | ICD-10-CM | POA: Diagnosis not present

## 2022-06-26 DIAGNOSIS — R0602 Shortness of breath: Secondary | ICD-10-CM | POA: Diagnosis not present

## 2022-06-26 DIAGNOSIS — I480 Paroxysmal atrial fibrillation: Secondary | ICD-10-CM | POA: Diagnosis not present

## 2022-08-01 DIAGNOSIS — I5022 Chronic systolic (congestive) heart failure: Secondary | ICD-10-CM | POA: Diagnosis not present

## 2022-08-01 DIAGNOSIS — D696 Thrombocytopenia, unspecified: Secondary | ICD-10-CM | POA: Diagnosis not present

## 2022-08-01 DIAGNOSIS — I480 Paroxysmal atrial fibrillation: Secondary | ICD-10-CM | POA: Diagnosis not present

## 2022-08-01 DIAGNOSIS — G8194 Hemiplegia, unspecified affecting left nondominant side: Secondary | ICD-10-CM | POA: Diagnosis not present

## 2022-08-01 DIAGNOSIS — Z8551 Personal history of malignant neoplasm of bladder: Secondary | ICD-10-CM | POA: Diagnosis not present

## 2022-08-01 DIAGNOSIS — I6930 Unspecified sequelae of cerebral infarction: Secondary | ICD-10-CM | POA: Diagnosis not present

## 2022-08-01 DIAGNOSIS — D6869 Other thrombophilia: Secondary | ICD-10-CM | POA: Diagnosis not present

## 2022-08-01 DIAGNOSIS — I10 Essential (primary) hypertension: Secondary | ICD-10-CM | POA: Diagnosis not present

## 2022-08-01 DIAGNOSIS — I700 Atherosclerosis of aorta: Secondary | ICD-10-CM | POA: Diagnosis not present

## 2022-08-30 DIAGNOSIS — Z8551 Personal history of malignant neoplasm of bladder: Secondary | ICD-10-CM | POA: Diagnosis not present

## 2022-08-30 DIAGNOSIS — I5022 Chronic systolic (congestive) heart failure: Secondary | ICD-10-CM | POA: Diagnosis not present

## 2022-10-12 DIAGNOSIS — E039 Hypothyroidism, unspecified: Secondary | ICD-10-CM | POA: Diagnosis not present

## 2022-10-12 DIAGNOSIS — I5022 Chronic systolic (congestive) heart failure: Secondary | ICD-10-CM | POA: Diagnosis not present

## 2022-10-12 DIAGNOSIS — E7800 Pure hypercholesterolemia, unspecified: Secondary | ICD-10-CM | POA: Diagnosis not present

## 2022-10-12 DIAGNOSIS — I10 Essential (primary) hypertension: Secondary | ICD-10-CM | POA: Diagnosis not present

## 2022-10-12 DIAGNOSIS — I480 Paroxysmal atrial fibrillation: Secondary | ICD-10-CM | POA: Diagnosis not present

## 2022-10-13 DIAGNOSIS — E785 Hyperlipidemia, unspecified: Secondary | ICD-10-CM | POA: Diagnosis not present

## 2022-10-13 DIAGNOSIS — Z9181 History of falling: Secondary | ICD-10-CM | POA: Diagnosis not present

## 2022-10-13 DIAGNOSIS — E039 Hypothyroidism, unspecified: Secondary | ICD-10-CM | POA: Diagnosis not present

## 2022-10-13 DIAGNOSIS — I5022 Chronic systolic (congestive) heart failure: Secondary | ICD-10-CM | POA: Diagnosis not present

## 2022-10-13 DIAGNOSIS — Z8551 Personal history of malignant neoplasm of bladder: Secondary | ICD-10-CM | POA: Diagnosis not present

## 2022-10-13 DIAGNOSIS — I69354 Hemiplegia and hemiparesis following cerebral infarction affecting left non-dominant side: Secondary | ICD-10-CM | POA: Diagnosis not present

## 2022-10-13 DIAGNOSIS — I700 Atherosclerosis of aorta: Secondary | ICD-10-CM | POA: Diagnosis not present

## 2022-10-13 DIAGNOSIS — D6869 Other thrombophilia: Secondary | ICD-10-CM | POA: Diagnosis not present

## 2022-10-13 DIAGNOSIS — I4892 Unspecified atrial flutter: Secondary | ICD-10-CM | POA: Diagnosis not present

## 2022-10-13 DIAGNOSIS — Z8616 Personal history of COVID-19: Secondary | ICD-10-CM | POA: Diagnosis not present

## 2022-10-13 DIAGNOSIS — Z7984 Long term (current) use of oral hypoglycemic drugs: Secondary | ICD-10-CM | POA: Diagnosis not present

## 2022-10-13 DIAGNOSIS — I110 Hypertensive heart disease with heart failure: Secondary | ICD-10-CM | POA: Diagnosis not present

## 2022-10-13 DIAGNOSIS — D696 Thrombocytopenia, unspecified: Secondary | ICD-10-CM | POA: Diagnosis not present

## 2022-10-13 DIAGNOSIS — I480 Paroxysmal atrial fibrillation: Secondary | ICD-10-CM | POA: Diagnosis not present

## 2022-10-13 DIAGNOSIS — I255 Ischemic cardiomyopathy: Secondary | ICD-10-CM | POA: Diagnosis not present

## 2022-10-13 DIAGNOSIS — Z7901 Long term (current) use of anticoagulants: Secondary | ICD-10-CM | POA: Diagnosis not present

## 2022-10-14 DIAGNOSIS — I700 Atherosclerosis of aorta: Secondary | ICD-10-CM | POA: Diagnosis not present

## 2022-10-14 DIAGNOSIS — Z7901 Long term (current) use of anticoagulants: Secondary | ICD-10-CM | POA: Diagnosis not present

## 2022-10-14 DIAGNOSIS — E039 Hypothyroidism, unspecified: Secondary | ICD-10-CM | POA: Diagnosis not present

## 2022-10-14 DIAGNOSIS — E785 Hyperlipidemia, unspecified: Secondary | ICD-10-CM | POA: Diagnosis not present

## 2022-10-14 DIAGNOSIS — Z8616 Personal history of COVID-19: Secondary | ICD-10-CM | POA: Diagnosis not present

## 2022-10-14 DIAGNOSIS — I480 Paroxysmal atrial fibrillation: Secondary | ICD-10-CM | POA: Diagnosis not present

## 2022-10-14 DIAGNOSIS — I110 Hypertensive heart disease with heart failure: Secondary | ICD-10-CM | POA: Diagnosis not present

## 2022-10-14 DIAGNOSIS — D696 Thrombocytopenia, unspecified: Secondary | ICD-10-CM | POA: Diagnosis not present

## 2022-10-14 DIAGNOSIS — Z9181 History of falling: Secondary | ICD-10-CM | POA: Diagnosis not present

## 2022-10-14 DIAGNOSIS — I69354 Hemiplegia and hemiparesis following cerebral infarction affecting left non-dominant side: Secondary | ICD-10-CM | POA: Diagnosis not present

## 2022-10-14 DIAGNOSIS — I255 Ischemic cardiomyopathy: Secondary | ICD-10-CM | POA: Diagnosis not present

## 2022-10-14 DIAGNOSIS — Z8551 Personal history of malignant neoplasm of bladder: Secondary | ICD-10-CM | POA: Diagnosis not present

## 2022-10-14 DIAGNOSIS — I4892 Unspecified atrial flutter: Secondary | ICD-10-CM | POA: Diagnosis not present

## 2022-10-14 DIAGNOSIS — Z7984 Long term (current) use of oral hypoglycemic drugs: Secondary | ICD-10-CM | POA: Diagnosis not present

## 2022-10-14 DIAGNOSIS — D6869 Other thrombophilia: Secondary | ICD-10-CM | POA: Diagnosis not present

## 2022-10-14 DIAGNOSIS — I5022 Chronic systolic (congestive) heart failure: Secondary | ICD-10-CM | POA: Diagnosis not present

## 2022-10-15 DIAGNOSIS — I700 Atherosclerosis of aorta: Secondary | ICD-10-CM | POA: Diagnosis not present

## 2022-10-15 DIAGNOSIS — D696 Thrombocytopenia, unspecified: Secondary | ICD-10-CM | POA: Diagnosis not present

## 2022-10-15 DIAGNOSIS — Z7901 Long term (current) use of anticoagulants: Secondary | ICD-10-CM | POA: Diagnosis not present

## 2022-10-15 DIAGNOSIS — I69354 Hemiplegia and hemiparesis following cerebral infarction affecting left non-dominant side: Secondary | ICD-10-CM | POA: Diagnosis not present

## 2022-10-15 DIAGNOSIS — Z8616 Personal history of COVID-19: Secondary | ICD-10-CM | POA: Diagnosis not present

## 2022-10-15 DIAGNOSIS — I5022 Chronic systolic (congestive) heart failure: Secondary | ICD-10-CM | POA: Diagnosis not present

## 2022-10-15 DIAGNOSIS — I480 Paroxysmal atrial fibrillation: Secondary | ICD-10-CM | POA: Diagnosis not present

## 2022-10-15 DIAGNOSIS — I255 Ischemic cardiomyopathy: Secondary | ICD-10-CM | POA: Diagnosis not present

## 2022-10-15 DIAGNOSIS — E785 Hyperlipidemia, unspecified: Secondary | ICD-10-CM | POA: Diagnosis not present

## 2022-10-15 DIAGNOSIS — Z7984 Long term (current) use of oral hypoglycemic drugs: Secondary | ICD-10-CM | POA: Diagnosis not present

## 2022-10-15 DIAGNOSIS — E039 Hypothyroidism, unspecified: Secondary | ICD-10-CM | POA: Diagnosis not present

## 2022-10-15 DIAGNOSIS — Z9181 History of falling: Secondary | ICD-10-CM | POA: Diagnosis not present

## 2022-10-15 DIAGNOSIS — I110 Hypertensive heart disease with heart failure: Secondary | ICD-10-CM | POA: Diagnosis not present

## 2022-10-15 DIAGNOSIS — D6869 Other thrombophilia: Secondary | ICD-10-CM | POA: Diagnosis not present

## 2022-10-15 DIAGNOSIS — I4892 Unspecified atrial flutter: Secondary | ICD-10-CM | POA: Diagnosis not present

## 2022-10-15 DIAGNOSIS — Z8551 Personal history of malignant neoplasm of bladder: Secondary | ICD-10-CM | POA: Diagnosis not present

## 2022-10-17 DIAGNOSIS — I69354 Hemiplegia and hemiparesis following cerebral infarction affecting left non-dominant side: Secondary | ICD-10-CM | POA: Diagnosis not present

## 2022-10-17 DIAGNOSIS — I5022 Chronic systolic (congestive) heart failure: Secondary | ICD-10-CM | POA: Diagnosis not present

## 2022-10-17 DIAGNOSIS — Z8551 Personal history of malignant neoplasm of bladder: Secondary | ICD-10-CM | POA: Diagnosis not present

## 2022-10-17 DIAGNOSIS — Z8616 Personal history of COVID-19: Secondary | ICD-10-CM | POA: Diagnosis not present

## 2022-10-17 DIAGNOSIS — I480 Paroxysmal atrial fibrillation: Secondary | ICD-10-CM | POA: Diagnosis not present

## 2022-10-17 DIAGNOSIS — D6869 Other thrombophilia: Secondary | ICD-10-CM | POA: Diagnosis not present

## 2022-10-17 DIAGNOSIS — E785 Hyperlipidemia, unspecified: Secondary | ICD-10-CM | POA: Diagnosis not present

## 2022-10-17 DIAGNOSIS — I110 Hypertensive heart disease with heart failure: Secondary | ICD-10-CM | POA: Diagnosis not present

## 2022-10-17 DIAGNOSIS — D696 Thrombocytopenia, unspecified: Secondary | ICD-10-CM | POA: Diagnosis not present

## 2022-10-17 DIAGNOSIS — Z9181 History of falling: Secondary | ICD-10-CM | POA: Diagnosis not present

## 2022-10-17 DIAGNOSIS — Z7984 Long term (current) use of oral hypoglycemic drugs: Secondary | ICD-10-CM | POA: Diagnosis not present

## 2022-10-17 DIAGNOSIS — I255 Ischemic cardiomyopathy: Secondary | ICD-10-CM | POA: Diagnosis not present

## 2022-10-17 DIAGNOSIS — I4892 Unspecified atrial flutter: Secondary | ICD-10-CM | POA: Diagnosis not present

## 2022-10-17 DIAGNOSIS — Z7901 Long term (current) use of anticoagulants: Secondary | ICD-10-CM | POA: Diagnosis not present

## 2022-10-17 DIAGNOSIS — I700 Atherosclerosis of aorta: Secondary | ICD-10-CM | POA: Diagnosis not present

## 2022-10-17 DIAGNOSIS — E039 Hypothyroidism, unspecified: Secondary | ICD-10-CM | POA: Diagnosis not present

## 2022-10-18 DIAGNOSIS — D6869 Other thrombophilia: Secondary | ICD-10-CM | POA: Diagnosis not present

## 2022-10-18 DIAGNOSIS — D696 Thrombocytopenia, unspecified: Secondary | ICD-10-CM | POA: Diagnosis not present

## 2022-10-18 DIAGNOSIS — Z7984 Long term (current) use of oral hypoglycemic drugs: Secondary | ICD-10-CM | POA: Diagnosis not present

## 2022-10-18 DIAGNOSIS — Z8616 Personal history of COVID-19: Secondary | ICD-10-CM | POA: Diagnosis not present

## 2022-10-18 DIAGNOSIS — I255 Ischemic cardiomyopathy: Secondary | ICD-10-CM | POA: Diagnosis not present

## 2022-10-18 DIAGNOSIS — I110 Hypertensive heart disease with heart failure: Secondary | ICD-10-CM | POA: Diagnosis not present

## 2022-10-18 DIAGNOSIS — I480 Paroxysmal atrial fibrillation: Secondary | ICD-10-CM | POA: Diagnosis not present

## 2022-10-18 DIAGNOSIS — I5022 Chronic systolic (congestive) heart failure: Secondary | ICD-10-CM | POA: Diagnosis not present

## 2022-10-18 DIAGNOSIS — I4892 Unspecified atrial flutter: Secondary | ICD-10-CM | POA: Diagnosis not present

## 2022-10-18 DIAGNOSIS — I69354 Hemiplegia and hemiparesis following cerebral infarction affecting left non-dominant side: Secondary | ICD-10-CM | POA: Diagnosis not present

## 2022-10-18 DIAGNOSIS — E785 Hyperlipidemia, unspecified: Secondary | ICD-10-CM | POA: Diagnosis not present

## 2022-10-18 DIAGNOSIS — E039 Hypothyroidism, unspecified: Secondary | ICD-10-CM | POA: Diagnosis not present

## 2022-10-18 DIAGNOSIS — Z8551 Personal history of malignant neoplasm of bladder: Secondary | ICD-10-CM | POA: Diagnosis not present

## 2022-10-18 DIAGNOSIS — Z9181 History of falling: Secondary | ICD-10-CM | POA: Diagnosis not present

## 2022-10-18 DIAGNOSIS — Z7901 Long term (current) use of anticoagulants: Secondary | ICD-10-CM | POA: Diagnosis not present

## 2022-10-18 DIAGNOSIS — I700 Atherosclerosis of aorta: Secondary | ICD-10-CM | POA: Diagnosis not present

## 2022-10-23 DIAGNOSIS — I4892 Unspecified atrial flutter: Secondary | ICD-10-CM | POA: Diagnosis not present

## 2022-10-23 DIAGNOSIS — I69354 Hemiplegia and hemiparesis following cerebral infarction affecting left non-dominant side: Secondary | ICD-10-CM | POA: Diagnosis not present

## 2022-10-23 DIAGNOSIS — I5022 Chronic systolic (congestive) heart failure: Secondary | ICD-10-CM | POA: Diagnosis not present

## 2022-10-23 DIAGNOSIS — I480 Paroxysmal atrial fibrillation: Secondary | ICD-10-CM | POA: Diagnosis not present

## 2022-10-23 DIAGNOSIS — Z7984 Long term (current) use of oral hypoglycemic drugs: Secondary | ICD-10-CM | POA: Diagnosis not present

## 2022-10-23 DIAGNOSIS — D6869 Other thrombophilia: Secondary | ICD-10-CM | POA: Diagnosis not present

## 2022-10-23 DIAGNOSIS — E039 Hypothyroidism, unspecified: Secondary | ICD-10-CM | POA: Diagnosis not present

## 2022-10-23 DIAGNOSIS — D696 Thrombocytopenia, unspecified: Secondary | ICD-10-CM | POA: Diagnosis not present

## 2022-10-23 DIAGNOSIS — Z8551 Personal history of malignant neoplasm of bladder: Secondary | ICD-10-CM | POA: Diagnosis not present

## 2022-10-23 DIAGNOSIS — Z9181 History of falling: Secondary | ICD-10-CM | POA: Diagnosis not present

## 2022-10-23 DIAGNOSIS — Z8616 Personal history of COVID-19: Secondary | ICD-10-CM | POA: Diagnosis not present

## 2022-10-23 DIAGNOSIS — I700 Atherosclerosis of aorta: Secondary | ICD-10-CM | POA: Diagnosis not present

## 2022-10-23 DIAGNOSIS — I255 Ischemic cardiomyopathy: Secondary | ICD-10-CM | POA: Diagnosis not present

## 2022-10-23 DIAGNOSIS — I110 Hypertensive heart disease with heart failure: Secondary | ICD-10-CM | POA: Diagnosis not present

## 2022-10-23 DIAGNOSIS — Z7901 Long term (current) use of anticoagulants: Secondary | ICD-10-CM | POA: Diagnosis not present

## 2022-10-23 DIAGNOSIS — E785 Hyperlipidemia, unspecified: Secondary | ICD-10-CM | POA: Diagnosis not present

## 2022-10-24 DIAGNOSIS — I480 Paroxysmal atrial fibrillation: Secondary | ICD-10-CM | POA: Diagnosis not present

## 2022-10-24 DIAGNOSIS — Z8551 Personal history of malignant neoplasm of bladder: Secondary | ICD-10-CM | POA: Diagnosis not present

## 2022-10-24 DIAGNOSIS — Z7901 Long term (current) use of anticoagulants: Secondary | ICD-10-CM | POA: Diagnosis not present

## 2022-10-24 DIAGNOSIS — I4892 Unspecified atrial flutter: Secondary | ICD-10-CM | POA: Diagnosis not present

## 2022-10-24 DIAGNOSIS — E039 Hypothyroidism, unspecified: Secondary | ICD-10-CM | POA: Diagnosis not present

## 2022-10-24 DIAGNOSIS — Z8616 Personal history of COVID-19: Secondary | ICD-10-CM | POA: Diagnosis not present

## 2022-10-24 DIAGNOSIS — I110 Hypertensive heart disease with heart failure: Secondary | ICD-10-CM | POA: Diagnosis not present

## 2022-10-24 DIAGNOSIS — E785 Hyperlipidemia, unspecified: Secondary | ICD-10-CM | POA: Diagnosis not present

## 2022-10-24 DIAGNOSIS — D6869 Other thrombophilia: Secondary | ICD-10-CM | POA: Diagnosis not present

## 2022-10-24 DIAGNOSIS — I5022 Chronic systolic (congestive) heart failure: Secondary | ICD-10-CM | POA: Diagnosis not present

## 2022-10-24 DIAGNOSIS — I700 Atherosclerosis of aorta: Secondary | ICD-10-CM | POA: Diagnosis not present

## 2022-10-24 DIAGNOSIS — I69354 Hemiplegia and hemiparesis following cerebral infarction affecting left non-dominant side: Secondary | ICD-10-CM | POA: Diagnosis not present

## 2022-10-24 DIAGNOSIS — Z9181 History of falling: Secondary | ICD-10-CM | POA: Diagnosis not present

## 2022-10-24 DIAGNOSIS — I255 Ischemic cardiomyopathy: Secondary | ICD-10-CM | POA: Diagnosis not present

## 2022-10-24 DIAGNOSIS — Z7984 Long term (current) use of oral hypoglycemic drugs: Secondary | ICD-10-CM | POA: Diagnosis not present

## 2022-10-24 DIAGNOSIS — D696 Thrombocytopenia, unspecified: Secondary | ICD-10-CM | POA: Diagnosis not present

## 2022-10-25 DIAGNOSIS — Z8616 Personal history of COVID-19: Secondary | ICD-10-CM | POA: Diagnosis not present

## 2022-10-25 DIAGNOSIS — Z7984 Long term (current) use of oral hypoglycemic drugs: Secondary | ICD-10-CM | POA: Diagnosis not present

## 2022-10-25 DIAGNOSIS — Z7901 Long term (current) use of anticoagulants: Secondary | ICD-10-CM | POA: Diagnosis not present

## 2022-10-25 DIAGNOSIS — I4892 Unspecified atrial flutter: Secondary | ICD-10-CM | POA: Diagnosis not present

## 2022-10-25 DIAGNOSIS — E039 Hypothyroidism, unspecified: Secondary | ICD-10-CM | POA: Diagnosis not present

## 2022-10-25 DIAGNOSIS — I255 Ischemic cardiomyopathy: Secondary | ICD-10-CM | POA: Diagnosis not present

## 2022-10-25 DIAGNOSIS — I480 Paroxysmal atrial fibrillation: Secondary | ICD-10-CM | POA: Diagnosis not present

## 2022-10-25 DIAGNOSIS — I110 Hypertensive heart disease with heart failure: Secondary | ICD-10-CM | POA: Diagnosis not present

## 2022-10-25 DIAGNOSIS — D6869 Other thrombophilia: Secondary | ICD-10-CM | POA: Diagnosis not present

## 2022-10-25 DIAGNOSIS — Z8551 Personal history of malignant neoplasm of bladder: Secondary | ICD-10-CM | POA: Diagnosis not present

## 2022-10-25 DIAGNOSIS — I69354 Hemiplegia and hemiparesis following cerebral infarction affecting left non-dominant side: Secondary | ICD-10-CM | POA: Diagnosis not present

## 2022-10-25 DIAGNOSIS — D696 Thrombocytopenia, unspecified: Secondary | ICD-10-CM | POA: Diagnosis not present

## 2022-10-25 DIAGNOSIS — Z9181 History of falling: Secondary | ICD-10-CM | POA: Diagnosis not present

## 2022-10-25 DIAGNOSIS — I700 Atherosclerosis of aorta: Secondary | ICD-10-CM | POA: Diagnosis not present

## 2022-10-25 DIAGNOSIS — I5022 Chronic systolic (congestive) heart failure: Secondary | ICD-10-CM | POA: Diagnosis not present

## 2022-10-25 DIAGNOSIS — E785 Hyperlipidemia, unspecified: Secondary | ICD-10-CM | POA: Diagnosis not present

## 2022-10-29 DIAGNOSIS — Z9181 History of falling: Secondary | ICD-10-CM | POA: Diagnosis not present

## 2022-10-29 DIAGNOSIS — I4892 Unspecified atrial flutter: Secondary | ICD-10-CM | POA: Diagnosis not present

## 2022-10-29 DIAGNOSIS — I5022 Chronic systolic (congestive) heart failure: Secondary | ICD-10-CM | POA: Diagnosis not present

## 2022-10-29 DIAGNOSIS — E039 Hypothyroidism, unspecified: Secondary | ICD-10-CM | POA: Diagnosis not present

## 2022-10-29 DIAGNOSIS — I255 Ischemic cardiomyopathy: Secondary | ICD-10-CM | POA: Diagnosis not present

## 2022-10-29 DIAGNOSIS — I480 Paroxysmal atrial fibrillation: Secondary | ICD-10-CM | POA: Diagnosis not present

## 2022-10-29 DIAGNOSIS — I69354 Hemiplegia and hemiparesis following cerebral infarction affecting left non-dominant side: Secondary | ICD-10-CM | POA: Diagnosis not present

## 2022-10-29 DIAGNOSIS — Z8551 Personal history of malignant neoplasm of bladder: Secondary | ICD-10-CM | POA: Diagnosis not present

## 2022-10-29 DIAGNOSIS — Z7901 Long term (current) use of anticoagulants: Secondary | ICD-10-CM | POA: Diagnosis not present

## 2022-10-29 DIAGNOSIS — Z8616 Personal history of COVID-19: Secondary | ICD-10-CM | POA: Diagnosis not present

## 2022-10-29 DIAGNOSIS — I700 Atherosclerosis of aorta: Secondary | ICD-10-CM | POA: Diagnosis not present

## 2022-10-29 DIAGNOSIS — Z7984 Long term (current) use of oral hypoglycemic drugs: Secondary | ICD-10-CM | POA: Diagnosis not present

## 2022-10-29 DIAGNOSIS — E785 Hyperlipidemia, unspecified: Secondary | ICD-10-CM | POA: Diagnosis not present

## 2022-10-29 DIAGNOSIS — D6869 Other thrombophilia: Secondary | ICD-10-CM | POA: Diagnosis not present

## 2022-10-29 DIAGNOSIS — D696 Thrombocytopenia, unspecified: Secondary | ICD-10-CM | POA: Diagnosis not present

## 2022-10-29 DIAGNOSIS — I110 Hypertensive heart disease with heart failure: Secondary | ICD-10-CM | POA: Diagnosis not present

## 2022-10-30 DIAGNOSIS — I110 Hypertensive heart disease with heart failure: Secondary | ICD-10-CM | POA: Diagnosis not present

## 2022-10-30 DIAGNOSIS — Z8551 Personal history of malignant neoplasm of bladder: Secondary | ICD-10-CM | POA: Diagnosis not present

## 2022-10-30 DIAGNOSIS — E039 Hypothyroidism, unspecified: Secondary | ICD-10-CM | POA: Diagnosis not present

## 2022-10-30 DIAGNOSIS — Z7901 Long term (current) use of anticoagulants: Secondary | ICD-10-CM | POA: Diagnosis not present

## 2022-10-30 DIAGNOSIS — Z7984 Long term (current) use of oral hypoglycemic drugs: Secondary | ICD-10-CM | POA: Diagnosis not present

## 2022-10-30 DIAGNOSIS — E785 Hyperlipidemia, unspecified: Secondary | ICD-10-CM | POA: Diagnosis not present

## 2022-10-30 DIAGNOSIS — I69354 Hemiplegia and hemiparesis following cerebral infarction affecting left non-dominant side: Secondary | ICD-10-CM | POA: Diagnosis not present

## 2022-10-30 DIAGNOSIS — I480 Paroxysmal atrial fibrillation: Secondary | ICD-10-CM | POA: Diagnosis not present

## 2022-10-30 DIAGNOSIS — I700 Atherosclerosis of aorta: Secondary | ICD-10-CM | POA: Diagnosis not present

## 2022-10-30 DIAGNOSIS — D696 Thrombocytopenia, unspecified: Secondary | ICD-10-CM | POA: Diagnosis not present

## 2022-10-30 DIAGNOSIS — Z8616 Personal history of COVID-19: Secondary | ICD-10-CM | POA: Diagnosis not present

## 2022-10-30 DIAGNOSIS — I255 Ischemic cardiomyopathy: Secondary | ICD-10-CM | POA: Diagnosis not present

## 2022-10-30 DIAGNOSIS — I5022 Chronic systolic (congestive) heart failure: Secondary | ICD-10-CM | POA: Diagnosis not present

## 2022-10-30 DIAGNOSIS — I4892 Unspecified atrial flutter: Secondary | ICD-10-CM | POA: Diagnosis not present

## 2022-10-30 DIAGNOSIS — Z9181 History of falling: Secondary | ICD-10-CM | POA: Diagnosis not present

## 2022-10-30 DIAGNOSIS — D6869 Other thrombophilia: Secondary | ICD-10-CM | POA: Diagnosis not present

## 2022-11-01 DIAGNOSIS — E785 Hyperlipidemia, unspecified: Secondary | ICD-10-CM | POA: Diagnosis not present

## 2022-11-01 DIAGNOSIS — Z8616 Personal history of COVID-19: Secondary | ICD-10-CM | POA: Diagnosis not present

## 2022-11-01 DIAGNOSIS — I255 Ischemic cardiomyopathy: Secondary | ICD-10-CM | POA: Diagnosis not present

## 2022-11-01 DIAGNOSIS — E039 Hypothyroidism, unspecified: Secondary | ICD-10-CM | POA: Diagnosis not present

## 2022-11-01 DIAGNOSIS — I700 Atherosclerosis of aorta: Secondary | ICD-10-CM | POA: Diagnosis not present

## 2022-11-01 DIAGNOSIS — I480 Paroxysmal atrial fibrillation: Secondary | ICD-10-CM | POA: Diagnosis not present

## 2022-11-01 DIAGNOSIS — Z7901 Long term (current) use of anticoagulants: Secondary | ICD-10-CM | POA: Diagnosis not present

## 2022-11-01 DIAGNOSIS — D6869 Other thrombophilia: Secondary | ICD-10-CM | POA: Diagnosis not present

## 2022-11-01 DIAGNOSIS — I4892 Unspecified atrial flutter: Secondary | ICD-10-CM | POA: Diagnosis not present

## 2022-11-01 DIAGNOSIS — D696 Thrombocytopenia, unspecified: Secondary | ICD-10-CM | POA: Diagnosis not present

## 2022-11-01 DIAGNOSIS — Z7984 Long term (current) use of oral hypoglycemic drugs: Secondary | ICD-10-CM | POA: Diagnosis not present

## 2022-11-01 DIAGNOSIS — Z8551 Personal history of malignant neoplasm of bladder: Secondary | ICD-10-CM | POA: Diagnosis not present

## 2022-11-01 DIAGNOSIS — I5022 Chronic systolic (congestive) heart failure: Secondary | ICD-10-CM | POA: Diagnosis not present

## 2022-11-01 DIAGNOSIS — I69354 Hemiplegia and hemiparesis following cerebral infarction affecting left non-dominant side: Secondary | ICD-10-CM | POA: Diagnosis not present

## 2022-11-01 DIAGNOSIS — I110 Hypertensive heart disease with heart failure: Secondary | ICD-10-CM | POA: Diagnosis not present

## 2022-11-01 DIAGNOSIS — Z9181 History of falling: Secondary | ICD-10-CM | POA: Diagnosis not present

## 2022-11-02 DIAGNOSIS — I4892 Unspecified atrial flutter: Secondary | ICD-10-CM | POA: Diagnosis not present

## 2022-11-02 DIAGNOSIS — I5022 Chronic systolic (congestive) heart failure: Secondary | ICD-10-CM | POA: Diagnosis not present

## 2022-11-02 DIAGNOSIS — Z9181 History of falling: Secondary | ICD-10-CM | POA: Diagnosis not present

## 2022-11-02 DIAGNOSIS — I255 Ischemic cardiomyopathy: Secondary | ICD-10-CM | POA: Diagnosis not present

## 2022-11-02 DIAGNOSIS — D6869 Other thrombophilia: Secondary | ICD-10-CM | POA: Diagnosis not present

## 2022-11-02 DIAGNOSIS — D696 Thrombocytopenia, unspecified: Secondary | ICD-10-CM | POA: Diagnosis not present

## 2022-11-02 DIAGNOSIS — I700 Atherosclerosis of aorta: Secondary | ICD-10-CM | POA: Diagnosis not present

## 2022-11-02 DIAGNOSIS — Z8616 Personal history of COVID-19: Secondary | ICD-10-CM | POA: Diagnosis not present

## 2022-11-02 DIAGNOSIS — I110 Hypertensive heart disease with heart failure: Secondary | ICD-10-CM | POA: Diagnosis not present

## 2022-11-02 DIAGNOSIS — I69354 Hemiplegia and hemiparesis following cerebral infarction affecting left non-dominant side: Secondary | ICD-10-CM | POA: Diagnosis not present

## 2022-11-02 DIAGNOSIS — E785 Hyperlipidemia, unspecified: Secondary | ICD-10-CM | POA: Diagnosis not present

## 2022-11-02 DIAGNOSIS — E039 Hypothyroidism, unspecified: Secondary | ICD-10-CM | POA: Diagnosis not present

## 2022-11-02 DIAGNOSIS — Z7901 Long term (current) use of anticoagulants: Secondary | ICD-10-CM | POA: Diagnosis not present

## 2022-11-02 DIAGNOSIS — Z8551 Personal history of malignant neoplasm of bladder: Secondary | ICD-10-CM | POA: Diagnosis not present

## 2022-11-02 DIAGNOSIS — Z7984 Long term (current) use of oral hypoglycemic drugs: Secondary | ICD-10-CM | POA: Diagnosis not present

## 2022-11-02 DIAGNOSIS — I480 Paroxysmal atrial fibrillation: Secondary | ICD-10-CM | POA: Diagnosis not present

## 2022-11-05 DIAGNOSIS — Z7901 Long term (current) use of anticoagulants: Secondary | ICD-10-CM | POA: Diagnosis not present

## 2022-11-05 DIAGNOSIS — I110 Hypertensive heart disease with heart failure: Secondary | ICD-10-CM | POA: Diagnosis not present

## 2022-11-05 DIAGNOSIS — I5022 Chronic systolic (congestive) heart failure: Secondary | ICD-10-CM | POA: Diagnosis not present

## 2022-11-05 DIAGNOSIS — E785 Hyperlipidemia, unspecified: Secondary | ICD-10-CM | POA: Diagnosis not present

## 2022-11-05 DIAGNOSIS — I700 Atherosclerosis of aorta: Secondary | ICD-10-CM | POA: Diagnosis not present

## 2022-11-05 DIAGNOSIS — D696 Thrombocytopenia, unspecified: Secondary | ICD-10-CM | POA: Diagnosis not present

## 2022-11-05 DIAGNOSIS — D6869 Other thrombophilia: Secondary | ICD-10-CM | POA: Diagnosis not present

## 2022-11-05 DIAGNOSIS — I255 Ischemic cardiomyopathy: Secondary | ICD-10-CM | POA: Diagnosis not present

## 2022-11-05 DIAGNOSIS — E039 Hypothyroidism, unspecified: Secondary | ICD-10-CM | POA: Diagnosis not present

## 2022-11-05 DIAGNOSIS — Z8616 Personal history of COVID-19: Secondary | ICD-10-CM | POA: Diagnosis not present

## 2022-11-05 DIAGNOSIS — I480 Paroxysmal atrial fibrillation: Secondary | ICD-10-CM | POA: Diagnosis not present

## 2022-11-05 DIAGNOSIS — Z7984 Long term (current) use of oral hypoglycemic drugs: Secondary | ICD-10-CM | POA: Diagnosis not present

## 2022-11-05 DIAGNOSIS — I4892 Unspecified atrial flutter: Secondary | ICD-10-CM | POA: Diagnosis not present

## 2022-11-05 DIAGNOSIS — I69354 Hemiplegia and hemiparesis following cerebral infarction affecting left non-dominant side: Secondary | ICD-10-CM | POA: Diagnosis not present

## 2022-11-05 DIAGNOSIS — Z9181 History of falling: Secondary | ICD-10-CM | POA: Diagnosis not present

## 2022-11-05 DIAGNOSIS — Z8551 Personal history of malignant neoplasm of bladder: Secondary | ICD-10-CM | POA: Diagnosis not present

## 2022-11-06 DIAGNOSIS — D6869 Other thrombophilia: Secondary | ICD-10-CM | POA: Diagnosis not present

## 2022-11-06 DIAGNOSIS — D696 Thrombocytopenia, unspecified: Secondary | ICD-10-CM | POA: Diagnosis not present

## 2022-11-06 DIAGNOSIS — I4892 Unspecified atrial flutter: Secondary | ICD-10-CM | POA: Diagnosis not present

## 2022-11-06 DIAGNOSIS — E785 Hyperlipidemia, unspecified: Secondary | ICD-10-CM | POA: Diagnosis not present

## 2022-11-06 DIAGNOSIS — Z7984 Long term (current) use of oral hypoglycemic drugs: Secondary | ICD-10-CM | POA: Diagnosis not present

## 2022-11-06 DIAGNOSIS — I5022 Chronic systolic (congestive) heart failure: Secondary | ICD-10-CM | POA: Diagnosis not present

## 2022-11-06 DIAGNOSIS — Z9181 History of falling: Secondary | ICD-10-CM | POA: Diagnosis not present

## 2022-11-06 DIAGNOSIS — Z8551 Personal history of malignant neoplasm of bladder: Secondary | ICD-10-CM | POA: Diagnosis not present

## 2022-11-06 DIAGNOSIS — Z8616 Personal history of COVID-19: Secondary | ICD-10-CM | POA: Diagnosis not present

## 2022-11-06 DIAGNOSIS — I480 Paroxysmal atrial fibrillation: Secondary | ICD-10-CM | POA: Diagnosis not present

## 2022-11-06 DIAGNOSIS — I110 Hypertensive heart disease with heart failure: Secondary | ICD-10-CM | POA: Diagnosis not present

## 2022-11-06 DIAGNOSIS — I255 Ischemic cardiomyopathy: Secondary | ICD-10-CM | POA: Diagnosis not present

## 2022-11-06 DIAGNOSIS — I700 Atherosclerosis of aorta: Secondary | ICD-10-CM | POA: Diagnosis not present

## 2022-11-06 DIAGNOSIS — E039 Hypothyroidism, unspecified: Secondary | ICD-10-CM | POA: Diagnosis not present

## 2022-11-06 DIAGNOSIS — Z7901 Long term (current) use of anticoagulants: Secondary | ICD-10-CM | POA: Diagnosis not present

## 2022-11-06 DIAGNOSIS — I69354 Hemiplegia and hemiparesis following cerebral infarction affecting left non-dominant side: Secondary | ICD-10-CM | POA: Diagnosis not present

## 2022-11-13 ENCOUNTER — Other Ambulatory Visit: Payer: Self-pay

## 2022-11-13 ENCOUNTER — Emergency Department (HOSPITAL_COMMUNITY)
Admission: EM | Admit: 2022-11-13 | Discharge: 2022-11-14 | Disposition: A | Discharge status: Home / Self Care | Payer: Medicare Other | Attending: Emergency Medicine | Admitting: Emergency Medicine

## 2022-11-13 ENCOUNTER — Emergency Department (HOSPITAL_COMMUNITY): Payer: Medicare Other

## 2022-11-13 ENCOUNTER — Encounter (HOSPITAL_COMMUNITY): Payer: Self-pay

## 2022-11-13 DIAGNOSIS — I499 Cardiac arrhythmia, unspecified: Secondary | ICD-10-CM | POA: Diagnosis not present

## 2022-11-13 DIAGNOSIS — E860 Dehydration: Secondary | ICD-10-CM

## 2022-11-13 DIAGNOSIS — E039 Hypothyroidism, unspecified: Secondary | ICD-10-CM | POA: Diagnosis not present

## 2022-11-13 DIAGNOSIS — Z9181 History of falling: Secondary | ICD-10-CM | POA: Diagnosis not present

## 2022-11-13 DIAGNOSIS — D696 Thrombocytopenia, unspecified: Secondary | ICD-10-CM | POA: Diagnosis not present

## 2022-11-13 DIAGNOSIS — Z7901 Long term (current) use of anticoagulants: Secondary | ICD-10-CM | POA: Insufficient documentation

## 2022-11-13 DIAGNOSIS — Z8616 Personal history of COVID-19: Secondary | ICD-10-CM | POA: Diagnosis not present

## 2022-11-13 DIAGNOSIS — I255 Ischemic cardiomyopathy: Secondary | ICD-10-CM | POA: Diagnosis not present

## 2022-11-13 DIAGNOSIS — Z743 Need for continuous supervision: Secondary | ICD-10-CM | POA: Diagnosis not present

## 2022-11-13 DIAGNOSIS — I69354 Hemiplegia and hemiparesis following cerebral infarction affecting left non-dominant side: Secondary | ICD-10-CM | POA: Diagnosis not present

## 2022-11-13 DIAGNOSIS — I5022 Chronic systolic (congestive) heart failure: Secondary | ICD-10-CM | POA: Diagnosis not present

## 2022-11-13 DIAGNOSIS — R0902 Hypoxemia: Secondary | ICD-10-CM | POA: Diagnosis not present

## 2022-11-13 DIAGNOSIS — I480 Paroxysmal atrial fibrillation: Secondary | ICD-10-CM | POA: Diagnosis not present

## 2022-11-13 DIAGNOSIS — I700 Atherosclerosis of aorta: Secondary | ICD-10-CM | POA: Diagnosis not present

## 2022-11-13 DIAGNOSIS — R0602 Shortness of breath: Secondary | ICD-10-CM | POA: Diagnosis not present

## 2022-11-13 DIAGNOSIS — Z7984 Long term (current) use of oral hypoglycemic drugs: Secondary | ICD-10-CM | POA: Diagnosis not present

## 2022-11-13 DIAGNOSIS — I110 Hypertensive heart disease with heart failure: Secondary | ICD-10-CM | POA: Diagnosis not present

## 2022-11-13 DIAGNOSIS — D6869 Other thrombophilia: Secondary | ICD-10-CM | POA: Diagnosis not present

## 2022-11-13 DIAGNOSIS — I959 Hypotension, unspecified: Secondary | ICD-10-CM | POA: Diagnosis not present

## 2022-11-13 DIAGNOSIS — Z8551 Personal history of malignant neoplasm of bladder: Secondary | ICD-10-CM | POA: Diagnosis not present

## 2022-11-13 DIAGNOSIS — I4892 Unspecified atrial flutter: Secondary | ICD-10-CM | POA: Diagnosis not present

## 2022-11-13 DIAGNOSIS — R6889 Other general symptoms and signs: Secondary | ICD-10-CM | POA: Diagnosis not present

## 2022-11-13 DIAGNOSIS — E785 Hyperlipidemia, unspecified: Secondary | ICD-10-CM | POA: Diagnosis not present

## 2022-11-13 LAB — TYPE AND SCREEN
ABO/RH(D): AB POS
Antibody Screen: NEGATIVE

## 2022-11-13 LAB — LACTIC ACID, PLASMA
Lactic Acid, Venous: 2.1 mmol/L (ref 0.5–1.9)
Lactic Acid, Venous: 1.9 mmol/L (ref 0.5–1.9)

## 2022-11-13 LAB — COMPREHENSIVE METABOLIC PANEL
ALT: 18 U/L (ref 0–44)
AST: 31 U/L (ref 15–41)
Albumin: 3.5 g/dL (ref 3.5–5.0)
Alkaline Phosphatase: 42 U/L (ref 38–126)
Anion gap: 10 (ref 5–15)
BUN: 32 mg/dL — ABNORMAL HIGH (ref 8–23)
CO2: 25 mmol/L (ref 22–32)
Calcium: 8.1 mg/dL — ABNORMAL LOW (ref 8.9–10.3)
Chloride: 99 mmol/L (ref 98–111)
Creatinine, Ser: 1.58 mg/dL — ABNORMAL HIGH (ref 0.61–1.24)
GFR, Estimated: 43 mL/min — ABNORMAL LOW (ref >60)
Glucose, Bld: 103 mg/dL — ABNORMAL HIGH (ref 70–99)
Potassium: 3.9 mmol/L (ref 3.5–5.1)
Sodium: 134 mmol/L — ABNORMAL LOW (ref 135–145)
Total Bilirubin: 0.9 mg/dL (ref 0.3–1.2)
Total Protein: 7.3 g/dL (ref 6.5–8.1)

## 2022-11-13 LAB — URINALYSIS, ROUTINE W REFLEX MICROSCOPIC
Bacteria, UA: NONE SEEN
Bilirubin Urine: NEGATIVE
Glucose, UA: >=500 mg/dL — AB
Ketones, ur: NEGATIVE mg/dL
Leukocytes,Ua: NEGATIVE
Nitrite: NEGATIVE
Protein, ur: NEGATIVE mg/dL
Specific Gravity, Urine: 1.008 (ref 1.005–1.030)
pH: 5 (ref 5.0–8.0)

## 2022-11-13 LAB — CBC WITH DIFFERENTIAL/PLATELET
Abs Immature Granulocytes: 0.02 10*3/uL (ref 0.00–0.07)
Basophils Absolute: 0.1 10*3/uL (ref 0.0–0.1)
Basophils Relative: 1 %
Eosinophils Absolute: 0.3 10*3/uL (ref 0.0–0.5)
Eosinophils Relative: 4 %
HCT: 40.5 % (ref 39.0–52.0)
Hemoglobin: 14 g/dL (ref 13.0–17.0)
Immature Granulocytes: 0 %
Lymphocytes Relative: 26 %
Lymphs Abs: 1.7 10*3/uL (ref 0.7–4.0)
MCH: 35.4 pg — ABNORMAL HIGH (ref 26.0–34.0)
MCHC: 34.6 g/dL (ref 30.0–36.0)
MCV: 102.5 fL — ABNORMAL HIGH (ref 80.0–100.0)
Monocytes Absolute: 1.1 10*3/uL — ABNORMAL HIGH (ref 0.1–1.0)
Monocytes Relative: 16 %
Neutro Abs: 3.3 10*3/uL (ref 1.7–7.7)
Neutrophils Relative %: 53 %
Platelets: 131 10*3/uL — ABNORMAL LOW (ref 150–400)
RBC: 3.95 MIL/uL — ABNORMAL LOW (ref 4.22–5.81)
RDW: 14.2 % (ref 11.5–15.5)
WBC: 6.4 10*3/uL (ref 4.0–10.5)
nRBC: 0 % (ref 0.0–0.2)

## 2022-11-13 MED ORDER — LACTATED RINGERS IV BOLUS
500.0000 mL | Freq: Once | INTRAVENOUS | Status: AC
  Administered 2022-11-13: 500 mL via INTRAVENOUS

## 2022-11-13 MED ORDER — LACTATED RINGERS IV SOLN: INTRAVENOUS | Status: DC

## 2022-11-13 NOTE — ED Triage Notes (Signed)
[facility] reports pt coming from home for low blood pressure, home health nurse on scene advised that it was low and pt was less talkative than normal. EMS pressure 80/60. Pt denies any chest pain, sob or pain.

## 2022-11-13 NOTE — ED Provider Notes (Signed)
[place] EMERGENCY DEPARTMENT AT [facility] Provider Note   CSN: [id number] Arrival date & time: 11/13/22  1450     History  Chief Complaint  Patient presents with   Hypotension    Lucas Fuller is a 82 y.o. male.  82 year old male presents from home due to low blood pressure.  Home health nurse checked his blood pressure was found to be decreased.  He denies any recent illnesses.  No recent changes to his medications.  EMS was called and blood pressure was 80/60.  Given IV fluids and transported here.       Home Medications Prior to Admission medications   Medication Sig Start Date End Date Taking? Authorizing Provider  acetaminophen (TYLENOL) 500 MG tablet Take 1,000 mg by mouth every 6 (six) hours as needed for moderate pain or mild pain.    [provider]  albuterol (VENTOLIN HFA) 108 (90 Base) MCG/ACT inhaler Inhale 2 puffs into the lungs every 4 (four) hours as needed for wheezing or shortness of breath. 08/22/19   Kathlen Mody, MD  apixaban (ELIQUIS) 5 MG TABS tablet Take 1 tablet (5 mg total) by mouth 2 (two) times daily. 08/27/21   Tyrone Nine, MD  atorvastatin (LIPITOR) 20 MG tablet Take 1 tablet (20 mg total) by mouth daily. 11/21/21   Clegg, Amy D, NP  carvedilol (COREG) 3.125 MG tablet Take 1 tablet by mouth in the morning and at bedtime.    [provider]  furosemide (LASIX) 40 MG tablet Take 1 tablet (40 mg total) by mouth daily. 12/18/19   Sheilah Pigeon, PA-C  levothyroxine (SYNTHROID, LEVOTHROID) 50 MCG tablet Take 50 mcg by mouth daily before breakfast.    [provider]  lisinopril (ZESTRIL) 2.5 MG tablet TAKE 1 TABLET BY MOUTH EVERY DAY 01/29/22   Marinus Maw, MD  potassium chloride (KLOR-CON) 20 MEQ packet Take 20 mEq by mouth daily. 12/04/21   [provider]  spironolactone (ALDACTONE) 25 MG tablet Take 0.5 tablets (12.5 mg total) by mouth daily. Patient taking differently: Take 25 mg by mouth daily.  11/10/21 05/08/22  Barnetta Chapel, MD      Allergies    Asa [aspirin]    Review of Systems   Review of Systems  All other systems reviewed and are negative.   Physical Exam Updated Vital Signs BP 112/64 (BP Location: Left Arm)   Pulse (!) 54   Temp 97.7 F (36.5 C) (Oral)   Resp 18   SpO2 100%  Physical Exam Vitals and nursing note reviewed.  Constitutional:      General: He is not in acute distress.    Appearance: Normal appearance. He is well-developed. He is not toxic-appearing.  HENT:     Head: Normocephalic and atraumatic.  Eyes:     General: Lids are normal.     Conjunctiva/sclera: Conjunctivae normal.     Pupils: Pupils are equal, round, and reactive to light.  Neck:     Thyroid: No thyroid mass.     Trachea: No tracheal deviation.  Cardiovascular:     Rate and Rhythm: Normal rate and regular rhythm.     Heart sounds: Normal heart sounds. No murmur heard.    No gallop.  Pulmonary:     Effort: Pulmonary effort is normal. No respiratory distress.     Breath sounds: Normal breath sounds. No stridor. No decreased breath sounds, wheezing, rhonchi or rales.  Abdominal:     General: There is  no distension.     Palpations: Abdomen is soft.     Tenderness: There is no abdominal tenderness. There is no rebound.  Musculoskeletal:        General: No tenderness. Normal range of motion.     Cervical back: Normal range of motion and neck supple.  Skin:    General: Skin is warm and dry.     Findings: No abrasion or rash.  Neurological:     Mental Status: He is alert and oriented to person, place, and time.     GCS: GCS eye subscore is 4. GCS verbal subscore is 5. GCS motor subscore is 6.     Cranial Nerves: No cranial nerve deficit.     Sensory: No sensory deficit.     Motor: Atrophy present.  Psychiatric:        Attention and Perception: Attention normal.        Mood and Affect: Affect is flat.     ED Results / Procedures / Treatments   Labs (all labs  ordered are listed, but only abnormal results are displayed) Labs Reviewed  CBC WITH DIFFERENTIAL/PLATELET  COMPREHENSIVE METABOLIC PANEL  URINALYSIS, ROUTINE W REFLEX MICROSCOPIC  TYPE AND SCREEN    EKG EKG Interpretation  Date/Time:  Tuesday November 13 2022 15:08:09 EDT Ventricular Rate:  112 PR Interval:    QRS Duration: 145 QT Interval:  329 QTC Calculation: 573 R Axis:   260 Text Interpretation: Extreme tachycardia with wide complex, no further rhythm analysis attempted Confirmed by Lorre Nick ([id number]) on 11/13/2022 4:46:10 PM  Radiology No results found.  Procedures Procedures    Medications Ordered in ED Medications  lactated ringers infusion ( Intravenous New Bag/Given 11/13/22 1513)    ED Course/ Medical Decision Making/ A&P                             Medical Decision Making Amount and/or Complexity of Data Reviewed Labs: ordered. Radiology: ordered. ECG/medicine tests: ordered.  Risk Prescription drug management.  X-ray without acute findings.  Patient is EKG per interpretation shows mild sinus tach. Patient hypotensive prior to arrival here.  Blood pressure soft here.  Given IV fluids with good response.  Labs significant for increase in his creatinine.  Likely dehydration.  First lactate mildly elevated 2 point 1 repeat is decreased down to 1.9.  Patient is asymptomatic at this time.  Blood pressure is now stable.  Will discharge home        Final Clinical Impression(s) / ED Diagnoses Final diagnoses:  None    Rx / DC Orders ED Discharge Orders     None         Lorre Nick, MD 11/13/22 2120

## 2022-11-13 NOTE — ED Notes (Signed)
Called pt's niece, pt states that niece has housekey to his residence as he lives alone. Pt's Niece Steward Drone states that she will be unable to pick him up until [employer], notified charge RN.

## 2022-11-14 MED ORDER — APIXABAN 5 MG PO TABS
5.0000 mg | ORAL_TABLET | Freq: Two times a day (BID) | ORAL | Status: DC
  Administered 2022-11-14: 5 mg via ORAL
  Filled 2022-11-14: qty 1

## 2022-11-14 NOTE — ED Notes (Signed)
Pt provided perineal care, new brief applied, repositioned to comfort by NT and EMT-P

## 2022-11-14 NOTE — ED Notes (Signed)
Patient Alert and oriented to baseline. Stable and ambulatory to baseline. Patient verbalized understanding of the discharge instructions.  Patient belongings were taken by the patient.   

## 2022-11-16 DIAGNOSIS — Z8616 Personal history of COVID-19: Secondary | ICD-10-CM | POA: Diagnosis not present

## 2022-11-16 DIAGNOSIS — Z8551 Personal history of malignant neoplasm of bladder: Secondary | ICD-10-CM | POA: Diagnosis not present

## 2022-11-16 DIAGNOSIS — I255 Ischemic cardiomyopathy: Secondary | ICD-10-CM | POA: Diagnosis not present

## 2022-11-16 DIAGNOSIS — E785 Hyperlipidemia, unspecified: Secondary | ICD-10-CM | POA: Diagnosis not present

## 2022-11-16 DIAGNOSIS — Z9181 History of falling: Secondary | ICD-10-CM | POA: Diagnosis not present

## 2022-11-16 DIAGNOSIS — I4892 Unspecified atrial flutter: Secondary | ICD-10-CM | POA: Diagnosis not present

## 2022-11-16 DIAGNOSIS — Z7984 Long term (current) use of oral hypoglycemic drugs: Secondary | ICD-10-CM | POA: Diagnosis not present

## 2022-11-16 DIAGNOSIS — D696 Thrombocytopenia, unspecified: Secondary | ICD-10-CM | POA: Diagnosis not present

## 2022-11-16 DIAGNOSIS — I5022 Chronic systolic (congestive) heart failure: Secondary | ICD-10-CM | POA: Diagnosis not present

## 2022-11-16 DIAGNOSIS — I700 Atherosclerosis of aorta: Secondary | ICD-10-CM | POA: Diagnosis not present

## 2022-11-16 DIAGNOSIS — I110 Hypertensive heart disease with heart failure: Secondary | ICD-10-CM | POA: Diagnosis not present

## 2022-11-16 DIAGNOSIS — E039 Hypothyroidism, unspecified: Secondary | ICD-10-CM | POA: Diagnosis not present

## 2022-11-16 DIAGNOSIS — Z7901 Long term (current) use of anticoagulants: Secondary | ICD-10-CM | POA: Diagnosis not present

## 2022-11-16 DIAGNOSIS — I69354 Hemiplegia and hemiparesis following cerebral infarction affecting left non-dominant side: Secondary | ICD-10-CM | POA: Diagnosis not present

## 2022-11-16 DIAGNOSIS — I480 Paroxysmal atrial fibrillation: Secondary | ICD-10-CM | POA: Diagnosis not present

## 2022-11-16 DIAGNOSIS — D6869 Other thrombophilia: Secondary | ICD-10-CM | POA: Diagnosis not present

## 2022-11-20 DIAGNOSIS — I5022 Chronic systolic (congestive) heart failure: Secondary | ICD-10-CM | POA: Diagnosis not present

## 2022-11-20 DIAGNOSIS — I4892 Unspecified atrial flutter: Secondary | ICD-10-CM | POA: Diagnosis not present

## 2022-11-20 DIAGNOSIS — I69354 Hemiplegia and hemiparesis following cerebral infarction affecting left non-dominant side: Secondary | ICD-10-CM | POA: Diagnosis not present

## 2022-11-20 DIAGNOSIS — E039 Hypothyroidism, unspecified: Secondary | ICD-10-CM | POA: Diagnosis not present

## 2022-11-20 DIAGNOSIS — Z9181 History of falling: Secondary | ICD-10-CM | POA: Diagnosis not present

## 2022-11-20 DIAGNOSIS — D6869 Other thrombophilia: Secondary | ICD-10-CM | POA: Diagnosis not present

## 2022-11-20 DIAGNOSIS — I110 Hypertensive heart disease with heart failure: Secondary | ICD-10-CM | POA: Diagnosis not present

## 2022-11-20 DIAGNOSIS — I255 Ischemic cardiomyopathy: Secondary | ICD-10-CM | POA: Diagnosis not present

## 2022-11-20 DIAGNOSIS — Z7984 Long term (current) use of oral hypoglycemic drugs: Secondary | ICD-10-CM | POA: Diagnosis not present

## 2022-11-20 DIAGNOSIS — D696 Thrombocytopenia, unspecified: Secondary | ICD-10-CM | POA: Diagnosis not present

## 2022-11-20 DIAGNOSIS — Z8551 Personal history of malignant neoplasm of bladder: Secondary | ICD-10-CM | POA: Diagnosis not present

## 2022-11-20 DIAGNOSIS — Z8616 Personal history of COVID-19: Secondary | ICD-10-CM | POA: Diagnosis not present

## 2022-11-20 DIAGNOSIS — I700 Atherosclerosis of aorta: Secondary | ICD-10-CM | POA: Diagnosis not present

## 2022-11-20 DIAGNOSIS — I480 Paroxysmal atrial fibrillation: Secondary | ICD-10-CM | POA: Diagnosis not present

## 2022-11-20 DIAGNOSIS — Z7901 Long term (current) use of anticoagulants: Secondary | ICD-10-CM | POA: Diagnosis not present

## 2022-11-20 DIAGNOSIS — E785 Hyperlipidemia, unspecified: Secondary | ICD-10-CM | POA: Diagnosis not present

## 2022-11-21 DIAGNOSIS — Z9181 History of falling: Secondary | ICD-10-CM | POA: Diagnosis not present

## 2022-11-21 DIAGNOSIS — Z8616 Personal history of COVID-19: Secondary | ICD-10-CM | POA: Diagnosis not present

## 2022-11-21 DIAGNOSIS — E039 Hypothyroidism, unspecified: Secondary | ICD-10-CM | POA: Diagnosis not present

## 2022-11-21 DIAGNOSIS — I4892 Unspecified atrial flutter: Secondary | ICD-10-CM | POA: Diagnosis not present

## 2022-11-21 DIAGNOSIS — I480 Paroxysmal atrial fibrillation: Secondary | ICD-10-CM | POA: Diagnosis not present

## 2022-11-21 DIAGNOSIS — Z8551 Personal history of malignant neoplasm of bladder: Secondary | ICD-10-CM | POA: Diagnosis not present

## 2022-11-21 DIAGNOSIS — Z7901 Long term (current) use of anticoagulants: Secondary | ICD-10-CM | POA: Diagnosis not present

## 2022-11-21 DIAGNOSIS — D696 Thrombocytopenia, unspecified: Secondary | ICD-10-CM | POA: Diagnosis not present

## 2022-11-21 DIAGNOSIS — I69354 Hemiplegia and hemiparesis following cerebral infarction affecting left non-dominant side: Secondary | ICD-10-CM | POA: Diagnosis not present

## 2022-11-21 DIAGNOSIS — I110 Hypertensive heart disease with heart failure: Secondary | ICD-10-CM | POA: Diagnosis not present

## 2022-11-21 DIAGNOSIS — Z7984 Long term (current) use of oral hypoglycemic drugs: Secondary | ICD-10-CM | POA: Diagnosis not present

## 2022-11-21 DIAGNOSIS — I700 Atherosclerosis of aorta: Secondary | ICD-10-CM | POA: Diagnosis not present

## 2022-11-21 DIAGNOSIS — D6869 Other thrombophilia: Secondary | ICD-10-CM | POA: Diagnosis not present

## 2022-11-21 DIAGNOSIS — I5022 Chronic systolic (congestive) heart failure: Secondary | ICD-10-CM | POA: Diagnosis not present

## 2022-11-21 DIAGNOSIS — I255 Ischemic cardiomyopathy: Secondary | ICD-10-CM | POA: Diagnosis not present

## 2022-11-21 DIAGNOSIS — E785 Hyperlipidemia, unspecified: Secondary | ICD-10-CM | POA: Diagnosis not present

## 2022-11-22 DIAGNOSIS — Z8616 Personal history of COVID-19: Secondary | ICD-10-CM | POA: Diagnosis not present

## 2022-11-22 DIAGNOSIS — I5022 Chronic systolic (congestive) heart failure: Secondary | ICD-10-CM | POA: Diagnosis not present

## 2022-11-22 DIAGNOSIS — I255 Ischemic cardiomyopathy: Secondary | ICD-10-CM | POA: Diagnosis not present

## 2022-11-22 DIAGNOSIS — I700 Atherosclerosis of aorta: Secondary | ICD-10-CM | POA: Diagnosis not present

## 2022-11-22 DIAGNOSIS — D6869 Other thrombophilia: Secondary | ICD-10-CM | POA: Diagnosis not present

## 2022-11-22 DIAGNOSIS — I4892 Unspecified atrial flutter: Secondary | ICD-10-CM | POA: Diagnosis not present

## 2022-11-22 DIAGNOSIS — E039 Hypothyroidism, unspecified: Secondary | ICD-10-CM | POA: Diagnosis not present

## 2022-11-22 DIAGNOSIS — Z7984 Long term (current) use of oral hypoglycemic drugs: Secondary | ICD-10-CM | POA: Diagnosis not present

## 2022-11-22 DIAGNOSIS — Z8551 Personal history of malignant neoplasm of bladder: Secondary | ICD-10-CM | POA: Diagnosis not present

## 2022-11-22 DIAGNOSIS — D696 Thrombocytopenia, unspecified: Secondary | ICD-10-CM | POA: Diagnosis not present

## 2022-11-22 DIAGNOSIS — I480 Paroxysmal atrial fibrillation: Secondary | ICD-10-CM | POA: Diagnosis not present

## 2022-11-22 DIAGNOSIS — I110 Hypertensive heart disease with heart failure: Secondary | ICD-10-CM | POA: Diagnosis not present

## 2022-11-22 DIAGNOSIS — I69354 Hemiplegia and hemiparesis following cerebral infarction affecting left non-dominant side: Secondary | ICD-10-CM | POA: Diagnosis not present

## 2022-11-22 DIAGNOSIS — Z7901 Long term (current) use of anticoagulants: Secondary | ICD-10-CM | POA: Diagnosis not present

## 2022-11-22 DIAGNOSIS — E785 Hyperlipidemia, unspecified: Secondary | ICD-10-CM | POA: Diagnosis not present

## 2022-11-22 DIAGNOSIS — Z9181 History of falling: Secondary | ICD-10-CM | POA: Diagnosis not present

## 2022-11-23 DIAGNOSIS — I480 Paroxysmal atrial fibrillation: Secondary | ICD-10-CM | POA: Diagnosis not present

## 2022-11-23 DIAGNOSIS — Z9181 History of falling: Secondary | ICD-10-CM | POA: Diagnosis not present

## 2022-11-23 DIAGNOSIS — Z7901 Long term (current) use of anticoagulants: Secondary | ICD-10-CM | POA: Diagnosis not present

## 2022-11-23 DIAGNOSIS — Z7984 Long term (current) use of oral hypoglycemic drugs: Secondary | ICD-10-CM | POA: Diagnosis not present

## 2022-11-23 DIAGNOSIS — I69354 Hemiplegia and hemiparesis following cerebral infarction affecting left non-dominant side: Secondary | ICD-10-CM | POA: Diagnosis not present

## 2022-11-23 DIAGNOSIS — D696 Thrombocytopenia, unspecified: Secondary | ICD-10-CM | POA: Diagnosis not present

## 2022-11-23 DIAGNOSIS — Z8616 Personal history of COVID-19: Secondary | ICD-10-CM | POA: Diagnosis not present

## 2022-11-23 DIAGNOSIS — I4892 Unspecified atrial flutter: Secondary | ICD-10-CM | POA: Diagnosis not present

## 2022-11-23 DIAGNOSIS — I700 Atherosclerosis of aorta: Secondary | ICD-10-CM | POA: Diagnosis not present

## 2022-11-23 DIAGNOSIS — I255 Ischemic cardiomyopathy: Secondary | ICD-10-CM | POA: Diagnosis not present

## 2022-11-23 DIAGNOSIS — E039 Hypothyroidism, unspecified: Secondary | ICD-10-CM | POA: Diagnosis not present

## 2022-11-23 DIAGNOSIS — I110 Hypertensive heart disease with heart failure: Secondary | ICD-10-CM | POA: Diagnosis not present

## 2022-11-23 DIAGNOSIS — I5022 Chronic systolic (congestive) heart failure: Secondary | ICD-10-CM | POA: Diagnosis not present

## 2022-11-23 DIAGNOSIS — Z8551 Personal history of malignant neoplasm of bladder: Secondary | ICD-10-CM | POA: Diagnosis not present

## 2022-11-23 DIAGNOSIS — D6869 Other thrombophilia: Secondary | ICD-10-CM | POA: Diagnosis not present

## 2022-11-23 DIAGNOSIS — E785 Hyperlipidemia, unspecified: Secondary | ICD-10-CM | POA: Diagnosis not present

## 2022-11-26 DIAGNOSIS — I480 Paroxysmal atrial fibrillation: Secondary | ICD-10-CM | POA: Diagnosis not present

## 2022-11-26 DIAGNOSIS — N390 Urinary tract infection, site not specified: Secondary | ICD-10-CM | POA: Diagnosis not present

## 2022-11-26 DIAGNOSIS — E039 Hypothyroidism, unspecified: Secondary | ICD-10-CM | POA: Diagnosis not present

## 2022-11-26 DIAGNOSIS — Z7901 Long term (current) use of anticoagulants: Secondary | ICD-10-CM | POA: Diagnosis not present

## 2022-11-26 DIAGNOSIS — D6869 Other thrombophilia: Secondary | ICD-10-CM | POA: Diagnosis not present

## 2022-11-26 DIAGNOSIS — Z9181 History of falling: Secondary | ICD-10-CM | POA: Diagnosis not present

## 2022-11-26 DIAGNOSIS — E785 Hyperlipidemia, unspecified: Secondary | ICD-10-CM | POA: Diagnosis not present

## 2022-11-26 DIAGNOSIS — I700 Atherosclerosis of aorta: Secondary | ICD-10-CM | POA: Diagnosis not present

## 2022-11-26 DIAGNOSIS — I255 Ischemic cardiomyopathy: Secondary | ICD-10-CM | POA: Diagnosis not present

## 2022-11-26 DIAGNOSIS — I110 Hypertensive heart disease with heart failure: Secondary | ICD-10-CM | POA: Diagnosis not present

## 2022-11-26 DIAGNOSIS — Z8616 Personal history of COVID-19: Secondary | ICD-10-CM | POA: Diagnosis not present

## 2022-11-26 DIAGNOSIS — I69354 Hemiplegia and hemiparesis following cerebral infarction affecting left non-dominant side: Secondary | ICD-10-CM | POA: Diagnosis not present

## 2022-11-26 DIAGNOSIS — I5022 Chronic systolic (congestive) heart failure: Secondary | ICD-10-CM | POA: Diagnosis not present

## 2022-11-26 DIAGNOSIS — D696 Thrombocytopenia, unspecified: Secondary | ICD-10-CM | POA: Diagnosis not present

## 2022-11-26 DIAGNOSIS — Z8551 Personal history of malignant neoplasm of bladder: Secondary | ICD-10-CM | POA: Diagnosis not present

## 2022-11-26 DIAGNOSIS — I4892 Unspecified atrial flutter: Secondary | ICD-10-CM | POA: Diagnosis not present

## 2022-11-26 DIAGNOSIS — Z7984 Long term (current) use of oral hypoglycemic drugs: Secondary | ICD-10-CM | POA: Diagnosis not present

## 2022-11-27 ENCOUNTER — Emergency Department (HOSPITAL_COMMUNITY)
Admission: EM | Admit: 2022-11-27 | Discharge: 2022-11-27 | Disposition: A | Discharge status: Home / Self Care | Payer: Medicare Other | Attending: Emergency Medicine | Admitting: Emergency Medicine

## 2022-11-27 ENCOUNTER — Encounter (HOSPITAL_COMMUNITY): Payer: Self-pay

## 2022-11-27 ENCOUNTER — Emergency Department (HOSPITAL_COMMUNITY): Payer: Medicare Other

## 2022-11-27 DIAGNOSIS — Z9181 History of falling: Secondary | ICD-10-CM | POA: Diagnosis not present

## 2022-11-27 DIAGNOSIS — K6289 Other specified diseases of anus and rectum: Secondary | ICD-10-CM

## 2022-11-27 DIAGNOSIS — K5900 Constipation, unspecified: Secondary | ICD-10-CM | POA: Insufficient documentation

## 2022-11-27 DIAGNOSIS — D696 Thrombocytopenia, unspecified: Secondary | ICD-10-CM | POA: Diagnosis not present

## 2022-11-27 DIAGNOSIS — Z743 Need for continuous supervision: Secondary | ICD-10-CM | POA: Diagnosis not present

## 2022-11-27 DIAGNOSIS — R58 Hemorrhage, not elsewhere classified: Secondary | ICD-10-CM | POA: Diagnosis not present

## 2022-11-27 DIAGNOSIS — I5022 Chronic systolic (congestive) heart failure: Secondary | ICD-10-CM | POA: Diagnosis not present

## 2022-11-27 DIAGNOSIS — Z7901 Long term (current) use of anticoagulants: Secondary | ICD-10-CM | POA: Insufficient documentation

## 2022-11-27 DIAGNOSIS — I69354 Hemiplegia and hemiparesis following cerebral infarction affecting left non-dominant side: Secondary | ICD-10-CM | POA: Diagnosis not present

## 2022-11-27 DIAGNOSIS — I110 Hypertensive heart disease with heart failure: Secondary | ICD-10-CM | POA: Diagnosis not present

## 2022-11-27 DIAGNOSIS — Z7984 Long term (current) use of oral hypoglycemic drugs: Secondary | ICD-10-CM | POA: Diagnosis not present

## 2022-11-27 DIAGNOSIS — I4892 Unspecified atrial flutter: Secondary | ICD-10-CM | POA: Diagnosis not present

## 2022-11-27 DIAGNOSIS — Z8616 Personal history of COVID-19: Secondary | ICD-10-CM | POA: Diagnosis not present

## 2022-11-27 DIAGNOSIS — D6869 Other thrombophilia: Secondary | ICD-10-CM | POA: Diagnosis not present

## 2022-11-27 DIAGNOSIS — Z8551 Personal history of malignant neoplasm of bladder: Secondary | ICD-10-CM | POA: Diagnosis not present

## 2022-11-27 DIAGNOSIS — I700 Atherosclerosis of aorta: Secondary | ICD-10-CM | POA: Diagnosis not present

## 2022-11-27 DIAGNOSIS — R109 Unspecified abdominal pain: Secondary | ICD-10-CM | POA: Diagnosis not present

## 2022-11-27 DIAGNOSIS — I480 Paroxysmal atrial fibrillation: Secondary | ICD-10-CM | POA: Diagnosis not present

## 2022-11-27 DIAGNOSIS — E039 Hypothyroidism, unspecified: Secondary | ICD-10-CM | POA: Diagnosis not present

## 2022-11-27 DIAGNOSIS — I255 Ischemic cardiomyopathy: Secondary | ICD-10-CM | POA: Diagnosis not present

## 2022-11-27 DIAGNOSIS — E785 Hyperlipidemia, unspecified: Secondary | ICD-10-CM | POA: Diagnosis not present

## 2022-11-27 LAB — BASIC METABOLIC PANEL
Anion gap: 13 (ref 5–15)
BUN: 20 mg/dL (ref 8–23)
CO2: 26 mmol/L (ref 22–32)
Calcium: 9.3 mg/dL (ref 8.9–10.3)
Chloride: 95 mmol/L — ABNORMAL LOW (ref 98–111)
Creatinine, Ser: 1.47 mg/dL — ABNORMAL HIGH (ref 0.61–1.24)
GFR, Estimated: 47 mL/min — ABNORMAL LOW (ref >60)
Glucose, Bld: 102 mg/dL — ABNORMAL HIGH (ref 70–99)
Potassium: 4 mmol/L (ref 3.5–5.1)
Sodium: 134 mmol/L — ABNORMAL LOW (ref 135–145)

## 2022-11-27 LAB — CBC WITH DIFFERENTIAL/PLATELET
Abs Immature Granulocytes: 0 10*3/uL (ref 0.00–0.07)
Basophils Absolute: 0.1 10*3/uL (ref 0.0–0.1)
Basophils Relative: 1 %
Eosinophils Absolute: 0.1 10*3/uL (ref 0.0–0.5)
Eosinophils Relative: 1 %
HCT: 40.9 % (ref 39.0–52.0)
Hemoglobin: 14.3 g/dL (ref 13.0–17.0)
Immature Granulocytes: 0 %
Lymphocytes Relative: 18 %
Lymphs Abs: 1.2 10*3/uL (ref 0.7–4.0)
MCH: 35 pg — ABNORMAL HIGH (ref 26.0–34.0)
MCHC: 35 g/dL (ref 30.0–36.0)
MCV: 100 fL (ref 80.0–100.0)
Monocytes Absolute: 1.1 10*3/uL — ABNORMAL HIGH (ref 0.1–1.0)
Monocytes Relative: 16 %
Neutro Abs: 4.2 10*3/uL (ref 1.7–7.7)
Neutrophils Relative %: 64 %
Platelets: 112 10*3/uL — ABNORMAL LOW (ref 150–400)
RBC: 4.09 MIL/uL — ABNORMAL LOW (ref 4.22–5.81)
RDW: 13.8 % (ref 11.5–15.5)
WBC: 6.5 10*3/uL (ref 4.0–10.5)
nRBC: 0 % (ref 0.0–0.2)

## 2022-11-27 LAB — URINALYSIS, ROUTINE W REFLEX MICROSCOPIC
Bilirubin Urine: NEGATIVE
Glucose, UA: >=500 mg/dL — AB
Hgb urine dipstick: NEGATIVE
Ketones, ur: NEGATIVE mg/dL
Leukocytes,Ua: NEGATIVE
Nitrite: NEGATIVE
Protein, ur: NEGATIVE mg/dL
Specific Gravity, Urine: 1.02 (ref 1.005–1.030)
pH: 6 (ref 5.0–8.0)

## 2022-11-27 LAB — URINALYSIS, MICROSCOPIC (REFLEX)

## 2022-11-27 MED ORDER — FLEET ENEMA 7-19 GM/118ML RE ENEM
1.0000 | ENEMA | Freq: Once | RECTAL | Status: AC
  Administered 2022-11-27: 1 via RECTAL
  Filled 2022-11-27: qty 1

## 2022-11-27 NOTE — ED Notes (Signed)
Pt had a large Bm with small amount of blood.

## 2022-11-27 NOTE — ED Provider Notes (Signed)
[place] EMERGENCY DEPARTMENT AT [facility] Provider Note   CSN: [id number] Arrival date & time: 11/27/22  4098     History {Add pertinent medical, surgical, social history, OB history to HPI:1} Chief Complaint  Patient presents with   Rectal Pain    Lucas Fuller is a 82 y.o. male.  He is a fairly poor historian.  Complaining of rectal pain that started yesterday.  He apparently called 911 last night and then declined transport and then called again today.  There was some reported blood in his brief.  He said he has been constipated.  He denies any abdominal pain fever nausea vomiting.  He has tried nothing for his symptoms.  He is on Eliquis.  The history is provided by the patient.  Rectal Bleeding Quality:  Bright red Amount:  Scant Chronicity:  New Context: constipation   Relieved by:  None tried Worsened by:  Nothing Ineffective treatments:  None tried Associated symptoms: no abdominal pain, no dizziness, no fever, no hematemesis, no light-headedness and no vomiting   Risk factors: anticoagulant use        Home Medications Prior to Admission medications   Medication Sig Start Date End Date Taking? Authorizing Provider  acetaminophen (TYLENOL) 500 MG tablet Take 1,000 mg by mouth every 6 (six) hours as needed for moderate pain or mild pain.    [provider]  albuterol (VENTOLIN HFA) 108 (90 Base) MCG/ACT inhaler Inhale 2 puffs into the lungs every 4 (four) hours as needed for wheezing or shortness of breath. 08/22/19   Kathlen Mody, MD  apixaban (ELIQUIS) 5 MG TABS tablet Take 1 tablet (5 mg total) by mouth 2 (two) times daily. 08/27/21   Tyrone Nine, MD  atorvastatin (LIPITOR) 20 MG tablet Take 1 tablet (20 mg total) by mouth daily. 11/21/21   Clegg, Amy D, NP  carvedilol (COREG) 3.125 MG tablet Take 1 tablet by mouth in the morning and at bedtime.    [provider]  furosemide (LASIX) 40 MG tablet Take 1 tablet (40 mg total) by mouth  daily. 12/18/19   Sheilah Pigeon, PA-C  levothyroxine (SYNTHROID, LEVOTHROID) 50 MCG tablet Take 50 mcg by mouth daily before breakfast.    [provider]  lisinopril (ZESTRIL) 2.5 MG tablet TAKE 1 TABLET BY MOUTH EVERY DAY 01/29/22   Marinus Maw, MD  potassium chloride (KLOR-CON) 20 MEQ packet Take 20 mEq by mouth daily. 12/04/21   [provider]  spironolactone (ALDACTONE) 25 MG tablet Take 0.5 tablets (12.5 mg total) by mouth daily. Patient taking differently: Take 25 mg by mouth daily. 11/10/21 05/08/22  Barnetta Chapel, MD      Allergies    Asa [aspirin]    Review of Systems   Review of Systems  Constitutional:  Negative for fever.  Respiratory:  Negative for shortness of breath.   Cardiovascular:  Negative for chest pain.  Gastrointestinal:  Positive for constipation, hematochezia and rectal pain. Negative for abdominal pain, hematemesis and vomiting.  Neurological:  Negative for dizziness and light-headedness.    Physical Exam Updated Vital Signs BP 111/81 (BP Location: Right Arm)   Pulse 72   Temp (!) 97.5 F (36.4 C) (Oral)   Resp 20   SpO2 98%  Physical Exam Vitals and nursing note reviewed.  Constitutional:      General: He is not in acute distress.    Appearance: Normal appearance. He is well-developed.  HENT:     Head:  Normocephalic and atraumatic.  Eyes:     Conjunctiva/sclera: Conjunctivae normal.  Cardiovascular:     Rate and Rhythm: Normal rate and regular rhythm.     Heart sounds: No murmur heard. Pulmonary:     Effort: Pulmonary effort is normal. No respiratory distress.     Breath sounds: Normal breath sounds.  Abdominal:     Palpations: Abdomen is soft.     Tenderness: There is no abdominal tenderness. There is no guarding or rebound.  Genitourinary:    Comments: Rectal exam with nurse Megan as chaperone.  Normal tone no masses.  Moderate amount of medium consistency stool in vault.  No gross blood.  No external  hemorrhoids. Musculoskeletal:        General: No swelling.     Cervical back: Neck supple.  Skin:    General: Skin is warm and dry.     Capillary Refill: Capillary refill takes less than 2 seconds.  Neurological:     General: No focal deficit present.     Mental Status: He is alert.     ED Results / Procedures / Treatments   Labs (all labs ordered are listed, but only abnormal results are displayed) Labs Reviewed  BASIC METABOLIC PANEL  CBC WITH DIFFERENTIAL/PLATELET  URINALYSIS, ROUTINE W REFLEX MICROSCOPIC    EKG None  Radiology No results found.  Procedures Procedures  {Document cardiac monitor, telemetry assessment procedure when appropriate:1}  Medications Ordered in ED Medications  sodium phosphate (FLEET) 7-19 GM/118ML enema 1 enema (has no administration in time range)    ED Course/ Medical Decision Making/ A&P   {   Click here for ABCD2, HEART and other calculatorsREFRESH Note before signing :1}                          Medical Decision Making Amount and/or Complexity of Data Reviewed Labs: ordered. Radiology: ordered.  Risk OTC drugs.   This patient complains of ***; this involves an extensive number of treatment Options and is a complaint that carries with it a high risk of complications and morbidity. The differential includes ***  I ordered, reviewed and interpreted labs, which included *** I ordered medication *** and reviewed PMP when indicated. I ordered imaging studies which included *** and I independently    visualized and interpreted imaging which showed *** Additional history obtained from *** Previous records obtained and reviewed *** I consulted *** and discussed lab and imaging findings and discussed disposition.  Cardiac monitoring reviewed, *** Social determinants considered, *** Critical Interventions: ***  After the interventions stated above, I reevaluated the patient and found *** Admission and further testing  considered, ***   {Document critical care time when appropriate:1} {Document review of labs and clinical decision tools ie heart score, Chads2Vasc2 etc:1}  {Document your independent review of radiology images, and any outside records:1} {Document your discussion with family members, caretakers, and with consultants:1} {Document social determinants of health affecting pt's care:1} {Document your decision making why or why not admission, treatments were needed:1} Final Clinical Impression(s) / ED Diagnoses Final diagnoses:  None    Rx / DC Orders ED Discharge Orders     None

## 2022-11-27 NOTE — ED Notes (Signed)
Depends changed. Bright red blood noted.

## 2022-11-27 NOTE — ED Notes (Signed)
Pt had one small soft ball shaped stool with enema.

## 2022-11-27 NOTE — ED Triage Notes (Signed)
Pt BIBA from home. Pt c/o pain with BM. Denies N/V. Endorses some constipation.  Small amount of blood on sheet noted.

## 2022-11-27 NOTE — Discharge Instructions (Signed)
You were seen in the emergency department for rectal pain and bleeding.  You had signs of constipation and were given an enema with improvement in your symptoms.  You may still have some ongoing bleeding until your symptoms improve.  Please drink plenty of fluids.  Follow-up with your doctor.  Return to the emergency department if any worsening or concerning symptoms

## 2022-11-27 NOTE — ED Notes (Signed)
Patient transported to X-ray 

## 2022-11-29 DIAGNOSIS — I959 Hypotension, unspecified: Secondary | ICD-10-CM | POA: Diagnosis not present

## 2022-11-29 DIAGNOSIS — Z0000 Encounter for general adult medical examination without abnormal findings: Secondary | ICD-10-CM | POA: Diagnosis not present

## 2022-11-29 DIAGNOSIS — K5900 Constipation, unspecified: Secondary | ICD-10-CM | POA: Diagnosis not present

## 2022-11-30 DIAGNOSIS — I69354 Hemiplegia and hemiparesis following cerebral infarction affecting left non-dominant side: Secondary | ICD-10-CM | POA: Diagnosis not present

## 2022-11-30 DIAGNOSIS — D696 Thrombocytopenia, unspecified: Secondary | ICD-10-CM | POA: Diagnosis not present

## 2022-11-30 DIAGNOSIS — I4892 Unspecified atrial flutter: Secondary | ICD-10-CM | POA: Diagnosis not present

## 2022-11-30 DIAGNOSIS — Z7984 Long term (current) use of oral hypoglycemic drugs: Secondary | ICD-10-CM | POA: Diagnosis not present

## 2022-11-30 DIAGNOSIS — I255 Ischemic cardiomyopathy: Secondary | ICD-10-CM | POA: Diagnosis not present

## 2022-11-30 DIAGNOSIS — E785 Hyperlipidemia, unspecified: Secondary | ICD-10-CM | POA: Diagnosis not present

## 2022-11-30 DIAGNOSIS — Z8616 Personal history of COVID-19: Secondary | ICD-10-CM | POA: Diagnosis not present

## 2022-11-30 DIAGNOSIS — D6869 Other thrombophilia: Secondary | ICD-10-CM | POA: Diagnosis not present

## 2022-11-30 DIAGNOSIS — I700 Atherosclerosis of aorta: Secondary | ICD-10-CM | POA: Diagnosis not present

## 2022-11-30 DIAGNOSIS — I480 Paroxysmal atrial fibrillation: Secondary | ICD-10-CM | POA: Diagnosis not present

## 2022-11-30 DIAGNOSIS — E039 Hypothyroidism, unspecified: Secondary | ICD-10-CM | POA: Diagnosis not present

## 2022-11-30 DIAGNOSIS — Z8551 Personal history of malignant neoplasm of bladder: Secondary | ICD-10-CM | POA: Diagnosis not present

## 2022-11-30 DIAGNOSIS — I110 Hypertensive heart disease with heart failure: Secondary | ICD-10-CM | POA: Diagnosis not present

## 2022-11-30 DIAGNOSIS — Z7901 Long term (current) use of anticoagulants: Secondary | ICD-10-CM | POA: Diagnosis not present

## 2022-11-30 DIAGNOSIS — I5022 Chronic systolic (congestive) heart failure: Secondary | ICD-10-CM | POA: Diagnosis not present

## 2022-11-30 DIAGNOSIS — Z9181 History of falling: Secondary | ICD-10-CM | POA: Diagnosis not present

## 2022-12-05 DIAGNOSIS — E785 Hyperlipidemia, unspecified: Secondary | ICD-10-CM | POA: Diagnosis not present

## 2022-12-05 DIAGNOSIS — Z7901 Long term (current) use of anticoagulants: Secondary | ICD-10-CM | POA: Diagnosis not present

## 2022-12-05 DIAGNOSIS — I5022 Chronic systolic (congestive) heart failure: Secondary | ICD-10-CM | POA: Diagnosis not present

## 2022-12-05 DIAGNOSIS — D6869 Other thrombophilia: Secondary | ICD-10-CM | POA: Diagnosis not present

## 2022-12-05 DIAGNOSIS — I480 Paroxysmal atrial fibrillation: Secondary | ICD-10-CM | POA: Diagnosis not present

## 2022-12-05 DIAGNOSIS — Z8551 Personal history of malignant neoplasm of bladder: Secondary | ICD-10-CM | POA: Diagnosis not present

## 2022-12-05 DIAGNOSIS — I700 Atherosclerosis of aorta: Secondary | ICD-10-CM | POA: Diagnosis not present

## 2022-12-05 DIAGNOSIS — E039 Hypothyroidism, unspecified: Secondary | ICD-10-CM | POA: Diagnosis not present

## 2022-12-05 DIAGNOSIS — Z7984 Long term (current) use of oral hypoglycemic drugs: Secondary | ICD-10-CM | POA: Diagnosis not present

## 2022-12-05 DIAGNOSIS — I69354 Hemiplegia and hemiparesis following cerebral infarction affecting left non-dominant side: Secondary | ICD-10-CM | POA: Diagnosis not present

## 2022-12-05 DIAGNOSIS — I110 Hypertensive heart disease with heart failure: Secondary | ICD-10-CM | POA: Diagnosis not present

## 2022-12-05 DIAGNOSIS — I4892 Unspecified atrial flutter: Secondary | ICD-10-CM | POA: Diagnosis not present

## 2022-12-05 DIAGNOSIS — Z8616 Personal history of COVID-19: Secondary | ICD-10-CM | POA: Diagnosis not present

## 2022-12-05 DIAGNOSIS — I255 Ischemic cardiomyopathy: Secondary | ICD-10-CM | POA: Diagnosis not present

## 2022-12-05 DIAGNOSIS — D696 Thrombocytopenia, unspecified: Secondary | ICD-10-CM | POA: Diagnosis not present

## 2022-12-05 DIAGNOSIS — Z9181 History of falling: Secondary | ICD-10-CM | POA: Diagnosis not present

## 2022-12-11 DIAGNOSIS — I69354 Hemiplegia and hemiparesis following cerebral infarction affecting left non-dominant side: Secondary | ICD-10-CM | POA: Diagnosis not present

## 2022-12-11 DIAGNOSIS — D696 Thrombocytopenia, unspecified: Secondary | ICD-10-CM | POA: Diagnosis not present

## 2022-12-11 DIAGNOSIS — E785 Hyperlipidemia, unspecified: Secondary | ICD-10-CM | POA: Diagnosis not present

## 2022-12-11 DIAGNOSIS — I5022 Chronic systolic (congestive) heart failure: Secondary | ICD-10-CM | POA: Diagnosis not present

## 2022-12-11 DIAGNOSIS — Z9181 History of falling: Secondary | ICD-10-CM | POA: Diagnosis not present

## 2022-12-11 DIAGNOSIS — Z8551 Personal history of malignant neoplasm of bladder: Secondary | ICD-10-CM | POA: Diagnosis not present

## 2022-12-11 DIAGNOSIS — Z7984 Long term (current) use of oral hypoglycemic drugs: Secondary | ICD-10-CM | POA: Diagnosis not present

## 2022-12-11 DIAGNOSIS — D6869 Other thrombophilia: Secondary | ICD-10-CM | POA: Diagnosis not present

## 2022-12-11 DIAGNOSIS — I480 Paroxysmal atrial fibrillation: Secondary | ICD-10-CM | POA: Diagnosis not present

## 2022-12-11 DIAGNOSIS — E039 Hypothyroidism, unspecified: Secondary | ICD-10-CM | POA: Diagnosis not present

## 2022-12-11 DIAGNOSIS — I4892 Unspecified atrial flutter: Secondary | ICD-10-CM | POA: Diagnosis not present

## 2022-12-11 DIAGNOSIS — I700 Atherosclerosis of aorta: Secondary | ICD-10-CM | POA: Diagnosis not present

## 2022-12-11 DIAGNOSIS — I255 Ischemic cardiomyopathy: Secondary | ICD-10-CM | POA: Diagnosis not present

## 2022-12-11 DIAGNOSIS — Z8616 Personal history of COVID-19: Secondary | ICD-10-CM | POA: Diagnosis not present

## 2022-12-11 DIAGNOSIS — I110 Hypertensive heart disease with heart failure: Secondary | ICD-10-CM | POA: Diagnosis not present

## 2022-12-11 DIAGNOSIS — Z7901 Long term (current) use of anticoagulants: Secondary | ICD-10-CM | POA: Diagnosis not present

## 2022-12-13 DIAGNOSIS — I10 Essential (primary) hypertension: Secondary | ICD-10-CM | POA: Diagnosis not present

## 2022-12-13 DIAGNOSIS — I110 Hypertensive heart disease with heart failure: Secondary | ICD-10-CM | POA: Diagnosis not present

## 2022-12-13 DIAGNOSIS — I5020 Unspecified systolic (congestive) heart failure: Secondary | ICD-10-CM | POA: Diagnosis not present

## 2023-04-04 DIAGNOSIS — D696 Thrombocytopenia, unspecified: Secondary | ICD-10-CM | POA: Diagnosis not present

## 2023-04-04 DIAGNOSIS — I4892 Unspecified atrial flutter: Secondary | ICD-10-CM | POA: Diagnosis not present

## 2023-04-04 DIAGNOSIS — Z8551 Personal history of malignant neoplasm of bladder: Secondary | ICD-10-CM | POA: Diagnosis not present

## 2023-04-04 DIAGNOSIS — Z0000 Encounter for general adult medical examination without abnormal findings: Secondary | ICD-10-CM | POA: Diagnosis not present

## 2023-04-04 DIAGNOSIS — I5022 Chronic systolic (congestive) heart failure: Secondary | ICD-10-CM | POA: Diagnosis not present

## 2023-04-04 DIAGNOSIS — Z23 Encounter for immunization: Secondary | ICD-10-CM | POA: Diagnosis not present

## 2023-04-04 DIAGNOSIS — I10 Essential (primary) hypertension: Secondary | ICD-10-CM | POA: Diagnosis not present

## 2023-04-04 DIAGNOSIS — E039 Hypothyroidism, unspecified: Secondary | ICD-10-CM | POA: Diagnosis not present

## 2023-04-04 DIAGNOSIS — I700 Atherosclerosis of aorta: Secondary | ICD-10-CM | POA: Diagnosis not present

## 2023-04-04 DIAGNOSIS — I429 Cardiomyopathy, unspecified: Secondary | ICD-10-CM | POA: Diagnosis not present

## 2023-04-04 DIAGNOSIS — E7800 Pure hypercholesterolemia, unspecified: Secondary | ICD-10-CM | POA: Diagnosis not present

## 2023-07-30 ENCOUNTER — Emergency Department (HOSPITAL_COMMUNITY): Payer: Medicare Other

## 2023-07-30 ENCOUNTER — Inpatient Hospital Stay (HOSPITAL_COMMUNITY)
Admission: EM | Admit: 2023-07-30 | Discharge: 2023-08-02 | DRG: 444 | Disposition: A | Discharge status: Skilled Nursing Facility | Payer: Medicare Other | Attending: Internal Medicine | Admitting: Internal Medicine

## 2023-07-30 ENCOUNTER — Encounter (HOSPITAL_COMMUNITY): Payer: Self-pay | Admitting: Emergency Medicine

## 2023-07-30 ENCOUNTER — Other Ambulatory Visit: Payer: Self-pay

## 2023-07-30 DIAGNOSIS — I428 Other cardiomyopathies: Secondary | ICD-10-CM | POA: Diagnosis not present

## 2023-07-30 DIAGNOSIS — I4892 Unspecified atrial flutter: Secondary | ICD-10-CM | POA: Diagnosis not present

## 2023-07-30 DIAGNOSIS — Z7901 Long term (current) use of anticoagulants: Secondary | ICD-10-CM

## 2023-07-30 DIAGNOSIS — Z79899 Other long term (current) drug therapy: Secondary | ICD-10-CM | POA: Diagnosis not present

## 2023-07-30 DIAGNOSIS — R7989 Other specified abnormal findings of blood chemistry: Secondary | ICD-10-CM | POA: Diagnosis not present

## 2023-07-30 DIAGNOSIS — Z8616 Personal history of COVID-19: Secondary | ICD-10-CM

## 2023-07-30 DIAGNOSIS — L89322 Pressure ulcer of left buttock, stage 2: Secondary | ICD-10-CM | POA: Diagnosis not present

## 2023-07-30 DIAGNOSIS — E039 Hypothyroidism, unspecified: Secondary | ICD-10-CM | POA: Diagnosis present

## 2023-07-30 DIAGNOSIS — K8020 Calculus of gallbladder without cholecystitis without obstruction: Secondary | ICD-10-CM | POA: Diagnosis not present

## 2023-07-30 DIAGNOSIS — R918 Other nonspecific abnormal finding of lung field: Secondary | ICD-10-CM | POA: Diagnosis not present

## 2023-07-30 DIAGNOSIS — F0390 Unspecified dementia without behavioral disturbance: Secondary | ICD-10-CM | POA: Diagnosis present

## 2023-07-30 DIAGNOSIS — I5082 Biventricular heart failure: Secondary | ICD-10-CM | POA: Diagnosis not present

## 2023-07-30 DIAGNOSIS — Y92009 Unspecified place in unspecified non-institutional (private) residence as the place of occurrence of the external cause: Secondary | ICD-10-CM | POA: Diagnosis not present

## 2023-07-30 DIAGNOSIS — K810 Acute cholecystitis: Secondary | ICD-10-CM | POA: Diagnosis not present

## 2023-07-30 DIAGNOSIS — E785 Hyperlipidemia, unspecified: Secondary | ICD-10-CM | POA: Diagnosis not present

## 2023-07-30 DIAGNOSIS — Z8249 Family history of ischemic heart disease and other diseases of the circulatory system: Secondary | ICD-10-CM

## 2023-07-30 DIAGNOSIS — L89152 Pressure ulcer of sacral region, stage 2: Secondary | ICD-10-CM | POA: Diagnosis not present

## 2023-07-30 DIAGNOSIS — R7401 Elevation of levels of liver transaminase levels: Secondary | ICD-10-CM | POA: Diagnosis not present

## 2023-07-30 DIAGNOSIS — I5021 Acute systolic (congestive) heart failure: Secondary | ICD-10-CM | POA: Diagnosis not present

## 2023-07-30 DIAGNOSIS — G9341 Metabolic encephalopathy: Secondary | ICD-10-CM | POA: Diagnosis present

## 2023-07-30 DIAGNOSIS — I4891 Unspecified atrial fibrillation: Secondary | ICD-10-CM | POA: Diagnosis not present

## 2023-07-30 DIAGNOSIS — Y92008 Other place in unspecified non-institutional (private) residence as the place of occurrence of the external cause: Secondary | ICD-10-CM

## 2023-07-30 DIAGNOSIS — Z8551 Personal history of malignant neoplasm of bladder: Secondary | ICD-10-CM | POA: Diagnosis not present

## 2023-07-30 DIAGNOSIS — I110 Hypertensive heart disease with heart failure: Secondary | ICD-10-CM | POA: Diagnosis not present

## 2023-07-30 DIAGNOSIS — Z8701 Personal history of pneumonia (recurrent): Secondary | ICD-10-CM

## 2023-07-30 DIAGNOSIS — R17 Unspecified jaundice: Secondary | ICD-10-CM | POA: Diagnosis not present

## 2023-07-30 DIAGNOSIS — Z8673 Personal history of transient ischemic attack (TIA), and cerebral infarction without residual deficits: Secondary | ICD-10-CM

## 2023-07-30 DIAGNOSIS — K819 Cholecystitis, unspecified: Secondary | ICD-10-CM

## 2023-07-30 DIAGNOSIS — I517 Cardiomegaly: Secondary | ICD-10-CM | POA: Diagnosis not present

## 2023-07-30 DIAGNOSIS — E872 Acidosis, unspecified: Secondary | ICD-10-CM | POA: Diagnosis present

## 2023-07-30 DIAGNOSIS — I10 Essential (primary) hypertension: Secondary | ICD-10-CM | POA: Diagnosis present

## 2023-07-30 DIAGNOSIS — I4819 Other persistent atrial fibrillation: Secondary | ICD-10-CM | POA: Diagnosis not present

## 2023-07-30 DIAGNOSIS — W19XXXA Unspecified fall, initial encounter: Secondary | ICD-10-CM | POA: Diagnosis not present

## 2023-07-30 DIAGNOSIS — I480 Paroxysmal atrial fibrillation: Secondary | ICD-10-CM | POA: Diagnosis present

## 2023-07-30 DIAGNOSIS — Z886 Allergy status to analgesic agent status: Secondary | ICD-10-CM

## 2023-07-30 DIAGNOSIS — R932 Abnormal findings on diagnostic imaging of liver and biliary tract: Secondary | ICD-10-CM | POA: Diagnosis not present

## 2023-07-30 DIAGNOSIS — I5042 Chronic combined systolic (congestive) and diastolic (congestive) heart failure: Secondary | ICD-10-CM | POA: Diagnosis not present

## 2023-07-30 DIAGNOSIS — R55 Syncope and collapse: Secondary | ICD-10-CM | POA: Diagnosis not present

## 2023-07-30 DIAGNOSIS — R000 Tachycardia, unspecified: Secondary | ICD-10-CM | POA: Diagnosis present

## 2023-07-30 DIAGNOSIS — M6282 Rhabdomyolysis: Secondary | ICD-10-CM | POA: Diagnosis not present

## 2023-07-30 DIAGNOSIS — I499 Cardiac arrhythmia, unspecified: Secondary | ICD-10-CM | POA: Diagnosis not present

## 2023-07-30 DIAGNOSIS — I5040 Unspecified combined systolic (congestive) and diastolic (congestive) heart failure: Secondary | ICD-10-CM | POA: Diagnosis present

## 2023-07-30 DIAGNOSIS — R109 Unspecified abdominal pain: Secondary | ICD-10-CM | POA: Diagnosis not present

## 2023-07-30 DIAGNOSIS — Z833 Family history of diabetes mellitus: Secondary | ICD-10-CM

## 2023-07-30 DIAGNOSIS — J189 Pneumonia, unspecified organism: Secondary | ICD-10-CM | POA: Diagnosis present

## 2023-07-30 DIAGNOSIS — E038 Other specified hypothyroidism: Secondary | ICD-10-CM | POA: Diagnosis not present

## 2023-07-30 DIAGNOSIS — K838 Other specified diseases of biliary tract: Secondary | ICD-10-CM | POA: Diagnosis not present

## 2023-07-30 DIAGNOSIS — I6782 Cerebral ischemia: Secondary | ICD-10-CM | POA: Diagnosis not present

## 2023-07-30 DIAGNOSIS — Z79890 Hormone replacement therapy (postmenopausal): Secondary | ICD-10-CM

## 2023-07-30 DIAGNOSIS — R531 Weakness: Secondary | ICD-10-CM | POA: Diagnosis not present

## 2023-07-30 DIAGNOSIS — R9431 Abnormal electrocardiogram [ECG] [EKG]: Secondary | ICD-10-CM | POA: Diagnosis not present

## 2023-07-30 DIAGNOSIS — R188 Other ascites: Secondary | ICD-10-CM | POA: Diagnosis not present

## 2023-07-30 DIAGNOSIS — S3991XA Unspecified injury of abdomen, initial encounter: Secondary | ICD-10-CM | POA: Diagnosis not present

## 2023-07-30 DIAGNOSIS — Z66 Do not resuscitate: Secondary | ICD-10-CM | POA: Diagnosis not present

## 2023-07-30 DIAGNOSIS — Z515 Encounter for palliative care: Secondary | ICD-10-CM

## 2023-07-30 DIAGNOSIS — Z841 Family history of disorders of kidney and ureter: Secondary | ICD-10-CM

## 2023-07-30 LAB — URINALYSIS, W/ REFLEX TO CULTURE (INFECTION SUSPECTED)
Glucose, UA: NEGATIVE mg/dL
Hgb urine dipstick: NEGATIVE
Ketones, ur: 5 mg/dL — AB
Leukocytes,Ua: NEGATIVE
Nitrite: NEGATIVE
Protein, ur: 100 mg/dL — AB
Specific Gravity, Urine: 1.027 (ref 1.005–1.030)
pH: 5 (ref 5.0–8.0)

## 2023-07-30 LAB — I-STAT VENOUS BLOOD GAS, ED
Acid-Base Excess: 0 mmol/L (ref 0.0–2.0)
Bicarbonate: 25 mmol/L (ref 20.0–28.0)
Calcium, Ion: 1.01 mmol/L — ABNORMAL LOW (ref 1.15–1.40)
HCT: 44 % (ref 39.0–52.0)
Hemoglobin: 15 g/dL (ref 13.0–17.0)
O2 Saturation: 69 %
Potassium: 6.2 mmol/L — ABNORMAL HIGH (ref 3.5–5.1)
Sodium: 136 mmol/L (ref 135–145)
TCO2: 26 mmol/L (ref 22–32)
pCO2, Ven: 40.6 mm[Hg] — ABNORMAL LOW (ref 44–60)
pH, Ven: 7.398 (ref 7.25–7.43)
pO2, Ven: 36 mm[Hg] (ref 32–45)

## 2023-07-30 LAB — TROPONIN I (HIGH SENSITIVITY)
Troponin I (High Sensitivity): 83 ng/L — ABNORMAL HIGH (ref <18)
Troponin I (High Sensitivity): 79 ng/L — ABNORMAL HIGH (ref <18)

## 2023-07-30 LAB — CBC WITH DIFFERENTIAL/PLATELET
Abs Immature Granulocytes: 0.03 10*3/uL (ref 0.00–0.07)
Basophils Absolute: 0.1 10*3/uL (ref 0.0–0.1)
Basophils Relative: 1 %
Eosinophils Absolute: 0 10*3/uL (ref 0.0–0.5)
Eosinophils Relative: 0 %
HCT: 41.9 % (ref 39.0–52.0)
Hemoglobin: 14.9 g/dL (ref 13.0–17.0)
Immature Granulocytes: 0 %
Lymphocytes Relative: 8 %
Lymphs Abs: 0.8 10*3/uL (ref 0.7–4.0)
MCH: 35.1 pg — ABNORMAL HIGH (ref 26.0–34.0)
MCHC: 35.6 g/dL (ref 30.0–36.0)
MCV: 98.8 fL (ref 80.0–100.0)
Monocytes Absolute: 0.7 10*3/uL (ref 0.1–1.0)
Monocytes Relative: 7 %
Neutro Abs: 8.1 10*3/uL — ABNORMAL HIGH (ref 1.7–7.7)
Neutrophils Relative %: 84 %
Platelets: 199 10*3/uL (ref 150–400)
RBC: 4.24 MIL/uL (ref 4.22–5.81)
RDW: 14.4 % (ref 11.5–15.5)
WBC: 9.7 10*3/uL (ref 4.0–10.5)
nRBC: 0 % (ref 0.0–0.2)

## 2023-07-30 LAB — TSH: TSH: 6.559 u[IU]/mL — ABNORMAL HIGH (ref 0.350–4.500)

## 2023-07-30 LAB — MAGNESIUM: Magnesium: 2.1 mg/dL (ref 1.7–2.4)

## 2023-07-30 LAB — COMPREHENSIVE METABOLIC PANEL
ALT: 21 U/L (ref 0–44)
AST: 84 U/L — ABNORMAL HIGH (ref 15–41)
Albumin: 3.1 g/dL — ABNORMAL LOW (ref 3.5–5.0)
Alkaline Phosphatase: 50 U/L (ref 38–126)
Anion gap: 17 — ABNORMAL HIGH (ref 5–15)
BUN: 19 mg/dL (ref 8–23)
CO2: 18 mmol/L — ABNORMAL LOW (ref 22–32)
Calcium: 8.9 mg/dL (ref 8.9–10.3)
Chloride: 102 mmol/L (ref 98–111)
Creatinine, Ser: 0.92 mg/dL (ref 0.61–1.24)
GFR, Estimated: >60 mL/min (ref >60)
Glucose, Bld: 93 mg/dL (ref 70–99)
Potassium: 4.7 mmol/L (ref 3.5–5.1)
Sodium: 137 mmol/L (ref 135–145)
Total Bilirubin: 2.9 mg/dL — ABNORMAL HIGH (ref 0.0–1.2)
Total Protein: 7.5 g/dL (ref 6.5–8.1)

## 2023-07-30 LAB — T4, FREE: Free T4: 1.23 ng/dL — ABNORMAL HIGH (ref 0.61–1.12)

## 2023-07-30 LAB — AMMONIA: Ammonia: 26 umol/L (ref 9–35)

## 2023-07-30 MED ORDER — SODIUM CHLORIDE 0.9 % IV SOLN
1.0000 g | Freq: Once | INTRAVENOUS | Status: AC
  Administered 2023-07-30: 1 g via INTRAVENOUS
  Filled 2023-07-30: qty 10

## 2023-07-30 MED ORDER — METRONIDAZOLE 500 MG/100ML IV SOLN
500.0000 mg | Freq: Once | INTRAVENOUS | Status: AC
  Administered 2023-07-31: 500 mg via INTRAVENOUS
  Filled 2023-07-30: qty 100

## 2023-07-30 MED ORDER — LACTATED RINGERS IV SOLN: INTRAVENOUS | Status: DC

## 2023-07-30 NOTE — ED Triage Notes (Signed)
 Pt BIB EMS from home, pt was found on the floor by family. Reports the pt has not been acting right since 12/31. Pt unsteady on feet. EMS reports the pt lives alone, has started using a walker to ambulate. Pt unable to tell staff why he is here, when asked what happened pt states nothing. Strong smell of urine noted.

## 2023-07-30 NOTE — ED Provider Notes (Signed)
 [place] [facility] AT [facility] Provider Note   CSN: [id number] Arrival date & time: 07/30/23  1817     History  Chief Complaint  Patient presents with   Lucas Fuller is a 83 y.o. male.  Patient is an 83 year old male with a history of thyroid  disease, nonischemic cardiomyopathy, atrial fibrillation, CHF, dementia who is still currently living independently being brought in today by EMS after being found on the floor in his home and not able to get up.  Spoke with patient's niece Erminio who gave all of the history.  She reports that she goes and checks on him as often as she can and tries to remind him to take his medications.  She reports that she last saw him on 07/25/2023 and at that time he seemed to be at his baseline although she does admit that he has been declining.  She reports that the neighbor goes and checks on him regularly and does not feel that he had been in the floor for very long.  He was in the living room and looks like he had fallen while trying to get in his to his chair.  The walker was close by.  No other history is available and patient is not able to answer many questions except yes and no.  He says no to chest pain, shortness of breath and abdominal pain.  There was no evidence of any emesis but patient was soiled in stool and urine.  Erminio does confirm that he does take Eliquis  and thinks that he would take that fairly regularly.  The history is provided by the EMS personnel, medical records and a relative. The history is limited by the condition of the patient and the absence of a caregiver.  Fall       Home Medications Prior to Admission medications   Medication Sig Start Date End Date Taking? Authorizing Provider  acetaminophen  (TYLENOL ) 500 MG tablet Take 1,000 mg by mouth every 6 (six) hours as needed for moderate pain or mild pain.    [provider]  albuterol  (VENTOLIN  HFA) 108 (90 Base) MCG/ACT inhaler Inhale  2 puffs into the lungs every 4 (four) hours as needed for wheezing or shortness of breath. 08/22/19   Akula, Vijaya, MD  apixaban  (ELIQUIS ) 5 MG TABS tablet Take 1 tablet (5 mg total) by mouth 2 (two) times daily. 08/27/21   Bryn Bernardino NOVAK, MD  atorvastatin  (LIPITOR) 20 MG tablet Take 1 tablet (20 mg total) by mouth daily. 11/21/21   Clegg, Amy D, NP  carvedilol  (COREG ) 3.125 MG tablet Take 1 tablet by mouth in the morning and at bedtime.    [provider]  furosemide  (LASIX ) 40 MG tablet Take 1 tablet (40 mg total) by mouth daily. 12/18/19   Ursuy, Renee Lynn, PA-C  levothyroxine  (SYNTHROID , LEVOTHROID) 50 MCG tablet Take 50 mcg by mouth daily before breakfast.    [provider]  lisinopril  (ZESTRIL ) 2.5 MG tablet TAKE 1 TABLET BY MOUTH EVERY DAY 01/29/22   Waddell Danelle ORN, MD  potassium chloride  (KLOR-CON ) 20 MEQ packet Take 20 mEq by mouth daily. 12/04/21   [provider]  spironolactone  (ALDACTONE ) 25 MG tablet Take 0.5 tablets (12.5 mg total) by mouth daily. Patient taking differently: Take 25 mg by mouth daily. 11/10/21 05/08/22  Rosario Leatrice FERNS, MD      Allergies    Asa [aspirin ]    Review of Systems   Review  of Systems  Physical Exam Updated Vital Signs BP 117/86   Pulse 78   Temp 98.1 F (36.7 C) (Axillary)   Resp 16   Ht 5' 7 (1.702 m)   Wt 61.2 kg   SpO2 98%   BMI 21.14 kg/m  Physical Exam Vitals and nursing note reviewed.  Constitutional:      General: He is not in acute distress.    Appearance: He is well-developed.  HENT:     Head: Normocephalic and atraumatic.  Eyes:     General: Scleral icterus present.     Conjunctiva/sclera: Conjunctivae normal.     Pupils: Pupils are equal, round, and reactive to light.  Cardiovascular:     Rate and Rhythm: Regular rhythm. Tachycardia present.     Heart sounds: No murmur heard. Pulmonary:     Effort: Pulmonary effort is normal. No respiratory distress.     Breath sounds: Normal breath sounds.  No wheezing or rales.  Abdominal:     General: There is no distension.     Palpations: Abdomen is soft.     Tenderness: There is abdominal tenderness. There is no guarding or rebound. Positive signs include Murphy's sign.  Musculoskeletal:        General: No tenderness. Normal range of motion.     Cervical back: Normal range of motion and neck supple.     Right lower leg: Edema present.     Left lower leg: Edema present.     Comments: Stage 2 ulcer present in the gluteal region.  At the coccyx and on the left buttocks.  All small and no deep wounds or tunneling.  Skin:    General: Skin is warm and dry.     Coloration: Skin is pale.     Findings: No erythema or rash.  Neurological:     Mental Status: He is alert.     Comments: Confused, with coaxing is able to follow instructions.  Does not appear to have unilateral weakness or numbness.  No facial droop.  Speech is not coherent past yes and no  Psychiatric:        Behavior: Behavior normal.     ED Results / Procedures / Treatments   Labs (all labs ordered are listed, but only abnormal results are displayed) Labs Reviewed  CBC WITH DIFFERENTIAL/PLATELET - Abnormal; Notable for the following components:      Result Value   MCH 35.1 (*)    Neutro Abs 8.1 (*)    All other components within normal limits  COMPREHENSIVE METABOLIC PANEL - Abnormal; Notable for the following components:   CO2 18 (*)    Albumin 3.1 (*)    AST 84 (*)    Total Bilirubin 2.9 (*)    Anion gap 17 (*)    All other components within normal limits  URINALYSIS, W/ REFLEX TO CULTURE (INFECTION SUSPECTED) - Abnormal; Notable for the following components:   Color, Urine AMBER (*)    APPearance HAZY (*)    Bilirubin Urine SMALL (*)    Ketones, ur 5 (*)    Protein, ur 100 (*)    Bacteria, UA RARE (*)    All other components within normal limits  TSH - Abnormal; Notable for the following components:   TSH 6.559 (*)    All other components within normal  limits  T4, FREE - Abnormal; Notable for the following components:   Free T4 1.23 (*)    All other components within normal limits  I-STAT  VENOUS BLOOD GAS, ED - Abnormal; Notable for the following components:   pCO2, Ven 40.6 (*)    Potassium 6.2 (*)    Calcium , Ion 1.01 (*)    All other components within normal limits  TROPONIN I (HIGH SENSITIVITY) - Abnormal; Notable for the following components:   Troponin I (High Sensitivity) 83 (*)    All other components within normal limits  TROPONIN I (HIGH SENSITIVITY) - Abnormal; Notable for the following components:   Troponin I (High Sensitivity) 79 (*)    All other components within normal limits  MAGNESIUM  AMMONIA  BRAIN NATRIURETIC PEPTIDE    EKG EKG Interpretation Date/Time:  Tuesday July 30 2023 19:27:43 EST Ventricular Rate:  153 PR Interval:    QRS Duration:  134 QT Interval:  303 QTC Calculation: 466 R Axis:   -84  Text Interpretation: Atrial fibrillation Paired ventricular premature complexes Nonspecific IVCD with LAD Artifact No significant change since last tracing Confirmed by Doretha Folks ([id number]) on 07/30/2023 7:57:29 PM  Radiology US  Abdomen Limited RUQ (LIVER/GB) Result Date: 07/30/2023 CLINICAL DATA:  Transaminitis EXAM: ULTRASOUND ABDOMEN LIMITED RIGHT UPPER QUADRANT COMPARISON:  CT 09/21/2015 FINDINGS: Gallbladder: Sludge filled gallbladder. Increased wall thickness at 4.9 mm. Negative sonographic Murphy. Trace pericholecystic fluid Common bile duct: Diameter: 4.7 mm Liver: No focal hepatic abnormality. Echogenicity within normal limits. Trace perihepatic ascites. Questionable surface nodularity of the liver portal vein is patent on color Doppler imaging with normal direction of blood flow towards the liver. Other: Trace perihepatic ascites IMPRESSION: 1. Sludge filled gallbladder with increased wall thickness and trace pericholecystic fluid but negative sonographic Murphy. If acute gallbladder disease is a  concern, correlation with nuclear medicine hepatobiliary imaging could be obtained. 2. Trace perihepatic ascites. Questionable surface nodularity of the liver, correlate for cirrhosis risk factors. Electronically Signed   By: Luke Bun M.D.   On: 07/30/2023 22:31   CT Head Wo Contrast Result Date: 07/30/2023 CLINICAL DATA:  Mental status change EXAM: CT HEAD WITHOUT CONTRAST TECHNIQUE: Contiguous axial images were obtained from the base of the skull through the vertex without intravenous contrast. RADIATION DOSE REDUCTION: This exam was performed according to the departmental dose-optimization program which includes automated exposure control, adjustment of the mA and/or kV according to patient size and/or use of iterative reconstruction technique. COMPARISON:  CT brain 06/18/2022 trauma MRI 08/26/2021 FINDINGS: Brain: Motion degradation limits the exam. No gross hemorrhage or intracranial mass. Atrophy and chronic small vessel ischemic changes of the white matter. Chronic right parietal infarct. Interval right posterior frontal infarct, series 3, image 25, suspect chronic but new from prior head CT. Interval hypodensity at the right posterior temporal and parietal lobes consistent with infarct, probably chronic but limited by motion degradation and more extensive when compared to the CT from November of 2023. Ventricles are nonenlarged. Vascular: No hyperdense vessels.  Carotid vascular calcification Skull: Normal. Negative for fracture or focal lesion. Sinuses/Orbits: Moderate mucosal thickening within the paranasal sinuses. Other: None IMPRESSION: 1. Significantly limited by motion degradation. No obvious hemorrhage, mass or mass effect. 2. Atrophy and chronic small vessel ischemic changes of the white matter 3. Prior chronic right parietal infarct. Interval right posterior frontal and right temporoparietal areas of suspected infarction, suspect chronic but new as compared with head CT from 2023 and  assessment is limited by significant motion degradation. Electronically Signed   By: Luke Bun M.D.   On: 07/30/2023 20:59   DG Chest Port 1 View Result Date: 07/30/2023 CLINICAL DATA:  Altered  mental status EXAM: PORTABLE CHEST 1 VIEW COMPARISON:  Chest x-ray 11/13/2022 FINDINGS: The heart is enlarged, unchanged. There is no pleural effusion or pneumothorax. Both lungs are clear. The visualized skeletal structures are unremarkable. IMPRESSION: 1. No active disease. 2. Cardiomegaly. Electronically Signed   By: Greig Pique M.D.   On: 07/30/2023 19:29    Procedures Procedures    Medications Ordered in ED Medications - No data to display  ED Course/ Medical Decision Making/ A&P                                 Medical Decision Making Amount and/or Complexity of Data Reviewed Labs: ordered. Decision-making details documented in ED Course. Radiology: ordered and independent interpretation performed. Decision-making details documented in ED Course.   Pt with multiple medical problems and comorbidities and presenting today with a complaint that caries a high risk for morbidity and mortality.  Here today with decline in mental status and a fall at home.  Patient does have a significant cardiac history as well as a history of dementia that has been worsening and patient is currently living alone but appears to not be stable in this condition.  Spoke with his niece Erminio who says DSS has been involved and they are trying to find placement.  Unclear when patient last took his medication but he is on Eliquis  and was found to have a fall.  Concern for possible bleed versus stroke versus cardiac decompensation versus sepsis and infectious etiology.  Hemodynamically stable at this time.  Labs and imaging are pending.  11:01 PM I interpreted patient's labs and EKG.  EKG without acute changes, troponin is elevated at 79 and 80 but is flat, TSH is elevated at 6 but T4 is almost normal at 1.2, VBG  without acute findings, UA without evidence of infection, CBC within normal limits, CMP with elevated AST of 84 today, total bilirubin of 2.9, normal renal function, electrolytes and anion gap of 17, magnesium and ammonia within normal limits.  I have independently visualized and interpreted pt's images today.  Chest x-ray without acute findings.  Head CT without evidence of intracranial hemorrhage however radiology reports findings were limited by motion but patient does have prior chronic right parietal infarct and interval right posterior frontal and right temporal Porro parietal areas of suspected infarction which suspect to be chronic but new compared to head CT in 2023.  Due to patient's elevated bilirubin, anion gap and elevated AST a right upper quadrant ultrasound was done to on exam patient appeared to have pain with palpation in the right upper quadrant.  Ultrasound showed sludge filled gallbladder with increased wall thickening and trace pericholecystic fluid instead patient may need nuclear medicine hepatobiliary imaging in the future.  Will cover with antibiotics.  Given patient's change in functional status concern for underlying gallbladder pathology will admit for further care.          Final Clinical Impression(s) / ED Diagnoses Final diagnoses:  Fall, initial encounter  Weakness  Cholecystitis    Rx / DC Orders ED Discharge Orders     None         Doretha Folks, MD 07/30/23 [phone]

## 2023-07-30 NOTE — ED Notes (Signed)
 Awaiting patient from lobby.

## 2023-07-31 ENCOUNTER — Emergency Department (HOSPITAL_COMMUNITY): Payer: Medicare Other

## 2023-07-31 ENCOUNTER — Encounter (HOSPITAL_COMMUNITY): Payer: Self-pay | Admitting: Internal Medicine

## 2023-07-31 ENCOUNTER — Inpatient Hospital Stay (HOSPITAL_COMMUNITY): Payer: Medicare Other

## 2023-07-31 ENCOUNTER — Other Ambulatory Visit (HOSPITAL_COMMUNITY): Payer: Medicare Other

## 2023-07-31 DIAGNOSIS — Z8551 Personal history of malignant neoplasm of bladder: Secondary | ICD-10-CM

## 2023-07-31 DIAGNOSIS — L89152 Pressure ulcer of sacral region, stage 2: Secondary | ICD-10-CM | POA: Diagnosis present

## 2023-07-31 DIAGNOSIS — S3991XA Unspecified injury of abdomen, initial encounter: Secondary | ICD-10-CM | POA: Diagnosis not present

## 2023-07-31 DIAGNOSIS — R7989 Other specified abnormal findings of blood chemistry: Secondary | ICD-10-CM

## 2023-07-31 DIAGNOSIS — M6282 Rhabdomyolysis: Secondary | ICD-10-CM

## 2023-07-31 DIAGNOSIS — I480 Paroxysmal atrial fibrillation: Secondary | ICD-10-CM | POA: Diagnosis not present

## 2023-07-31 DIAGNOSIS — I4819 Other persistent atrial fibrillation: Secondary | ICD-10-CM | POA: Diagnosis not present

## 2023-07-31 DIAGNOSIS — K819 Cholecystitis, unspecified: Secondary | ICD-10-CM

## 2023-07-31 DIAGNOSIS — E038 Other specified hypothyroidism: Secondary | ICD-10-CM | POA: Diagnosis not present

## 2023-07-31 DIAGNOSIS — Z8673 Personal history of transient ischemic attack (TIA), and cerebral infarction without residual deficits: Secondary | ICD-10-CM | POA: Diagnosis not present

## 2023-07-31 DIAGNOSIS — R404 Transient alteration of awareness: Secondary | ICD-10-CM | POA: Diagnosis not present

## 2023-07-31 DIAGNOSIS — W19XXXA Unspecified fall, initial encounter: Secondary | ICD-10-CM | POA: Diagnosis not present

## 2023-07-31 DIAGNOSIS — I428 Other cardiomyopathies: Secondary | ICD-10-CM

## 2023-07-31 DIAGNOSIS — R1011 Right upper quadrant pain: Secondary | ICD-10-CM | POA: Diagnosis not present

## 2023-07-31 DIAGNOSIS — F0390 Unspecified dementia without behavioral disturbance: Secondary | ICD-10-CM | POA: Diagnosis present

## 2023-07-31 DIAGNOSIS — I4892 Unspecified atrial flutter: Secondary | ICD-10-CM | POA: Diagnosis present

## 2023-07-31 DIAGNOSIS — I5042 Chronic combined systolic (congestive) and diastolic (congestive) heart failure: Secondary | ICD-10-CM | POA: Diagnosis present

## 2023-07-31 DIAGNOSIS — R17 Unspecified jaundice: Secondary | ICD-10-CM

## 2023-07-31 DIAGNOSIS — E8809 Other disorders of plasma-protein metabolism, not elsewhere classified: Secondary | ICD-10-CM | POA: Diagnosis not present

## 2023-07-31 DIAGNOSIS — R627 Adult failure to thrive: Secondary | ICD-10-CM | POA: Diagnosis not present

## 2023-07-31 DIAGNOSIS — Z9181 History of falling: Secondary | ICD-10-CM | POA: Diagnosis not present

## 2023-07-31 DIAGNOSIS — G9341 Metabolic encephalopathy: Secondary | ICD-10-CM | POA: Diagnosis not present

## 2023-07-31 DIAGNOSIS — I5021 Acute systolic (congestive) heart failure: Secondary | ICD-10-CM | POA: Diagnosis not present

## 2023-07-31 DIAGNOSIS — Z8616 Personal history of COVID-19: Secondary | ICD-10-CM | POA: Diagnosis not present

## 2023-07-31 DIAGNOSIS — I700 Atherosclerosis of aorta: Secondary | ICD-10-CM | POA: Diagnosis not present

## 2023-07-31 DIAGNOSIS — Z79899 Other long term (current) drug therapy: Secondary | ICD-10-CM | POA: Diagnosis not present

## 2023-07-31 DIAGNOSIS — J189 Pneumonia, unspecified organism: Secondary | ICD-10-CM | POA: Diagnosis present

## 2023-07-31 DIAGNOSIS — Z66 Do not resuscitate: Secondary | ICD-10-CM | POA: Diagnosis present

## 2023-07-31 DIAGNOSIS — R6889 Other general symptoms and signs: Secondary | ICD-10-CM | POA: Diagnosis not present

## 2023-07-31 DIAGNOSIS — R55 Syncope and collapse: Secondary | ICD-10-CM | POA: Diagnosis present

## 2023-07-31 DIAGNOSIS — I5082 Biventricular heart failure: Secondary | ICD-10-CM | POA: Diagnosis present

## 2023-07-31 DIAGNOSIS — R188 Other ascites: Secondary | ICD-10-CM | POA: Diagnosis not present

## 2023-07-31 DIAGNOSIS — K8020 Calculus of gallbladder without cholecystitis without obstruction: Secondary | ICD-10-CM | POA: Diagnosis present

## 2023-07-31 DIAGNOSIS — E871 Hypo-osmolality and hyponatremia: Secondary | ICD-10-CM | POA: Diagnosis not present

## 2023-07-31 DIAGNOSIS — Z23 Encounter for immunization: Secondary | ICD-10-CM | POA: Diagnosis not present

## 2023-07-31 DIAGNOSIS — Z7901 Long term (current) use of anticoagulants: Secondary | ICD-10-CM | POA: Diagnosis not present

## 2023-07-31 DIAGNOSIS — Y92008 Other place in unspecified non-institutional (private) residence as the place of occurrence of the external cause: Secondary | ICD-10-CM | POA: Diagnosis not present

## 2023-07-31 DIAGNOSIS — Z7401 Bed confinement status: Secondary | ICD-10-CM | POA: Diagnosis not present

## 2023-07-31 DIAGNOSIS — M6281 Muscle weakness (generalized): Secondary | ICD-10-CM | POA: Diagnosis not present

## 2023-07-31 DIAGNOSIS — I5040 Unspecified combined systolic (congestive) and diastolic (congestive) heart failure: Secondary | ICD-10-CM | POA: Diagnosis not present

## 2023-07-31 DIAGNOSIS — Z79890 Hormone replacement therapy (postmenopausal): Secondary | ICD-10-CM | POA: Diagnosis not present

## 2023-07-31 DIAGNOSIS — Z515 Encounter for palliative care: Secondary | ICD-10-CM | POA: Diagnosis not present

## 2023-07-31 DIAGNOSIS — J449 Chronic obstructive pulmonary disease, unspecified: Secondary | ICD-10-CM | POA: Diagnosis not present

## 2023-07-31 DIAGNOSIS — E039 Hypothyroidism, unspecified: Secondary | ICD-10-CM | POA: Diagnosis not present

## 2023-07-31 DIAGNOSIS — E785 Hyperlipidemia, unspecified: Secondary | ICD-10-CM | POA: Diagnosis present

## 2023-07-31 DIAGNOSIS — I10 Essential (primary) hypertension: Secondary | ICD-10-CM

## 2023-07-31 DIAGNOSIS — E872 Acidosis, unspecified: Secondary | ICD-10-CM | POA: Diagnosis present

## 2023-07-31 DIAGNOSIS — I4891 Unspecified atrial fibrillation: Secondary | ICD-10-CM

## 2023-07-31 DIAGNOSIS — Y92009 Unspecified place in unspecified non-institutional (private) residence as the place of occurrence of the external cause: Secondary | ICD-10-CM

## 2023-07-31 DIAGNOSIS — R109 Unspecified abdominal pain: Secondary | ICD-10-CM | POA: Diagnosis not present

## 2023-07-31 DIAGNOSIS — R918 Other nonspecific abnormal finding of lung field: Secondary | ICD-10-CM | POA: Diagnosis not present

## 2023-07-31 DIAGNOSIS — L89322 Pressure ulcer of left buttock, stage 2: Secondary | ICD-10-CM | POA: Diagnosis present

## 2023-07-31 DIAGNOSIS — I5041 Acute combined systolic (congestive) and diastolic (congestive) heart failure: Secondary | ICD-10-CM | POA: Diagnosis not present

## 2023-07-31 DIAGNOSIS — I110 Hypertensive heart disease with heart failure: Secondary | ICD-10-CM | POA: Diagnosis present

## 2023-07-31 LAB — TROPONIN I (HIGH SENSITIVITY)
Troponin I (High Sensitivity): 89 ng/L — ABNORMAL HIGH (ref <18)
Troponin I (High Sensitivity): 106 ng/L (ref <18)
Troponin I (High Sensitivity): 109 ng/L (ref <18)
Troponin I (High Sensitivity): 113 ng/L (ref <18)
Troponin I (High Sensitivity): 118 ng/L (ref <18)
Troponin I (High Sensitivity): 124 ng/L (ref <18)

## 2023-07-31 LAB — COMPREHENSIVE METABOLIC PANEL
ALT: 18 U/L (ref 0–44)
AST: 81 U/L — ABNORMAL HIGH (ref 15–41)
Albumin: 2.7 g/dL — ABNORMAL LOW (ref 3.5–5.0)
Alkaline Phosphatase: 45 U/L (ref 38–126)
Anion gap: 14 (ref 5–15)
BUN: 18 mg/dL (ref 8–23)
CO2: 17 mmol/L — ABNORMAL LOW (ref 22–32)
Calcium: 8.4 mg/dL — ABNORMAL LOW (ref 8.9–10.3)
Chloride: 104 mmol/L (ref 98–111)
Creatinine, Ser: 0.91 mg/dL (ref 0.61–1.24)
GFR, Estimated: >60 mL/min (ref >60)
Glucose, Bld: 83 mg/dL (ref 70–99)
Potassium: 4.7 mmol/L (ref 3.5–5.1)
Sodium: 135 mmol/L (ref 135–145)
Total Bilirubin: 2.5 mg/dL — ABNORMAL HIGH (ref 0.0–1.2)
Total Protein: 6.8 g/dL (ref 6.5–8.1)

## 2023-07-31 LAB — TSH: TSH: 5.442 u[IU]/mL — ABNORMAL HIGH (ref 0.350–4.500)

## 2023-07-31 LAB — ECHOCARDIOGRAM COMPLETE
AR max vel: 1.14 cm2
AV Area VTI: 1.06 cm2
AV Area mean vel: 1.09 cm2
AV Mean grad: 7 mm[Hg]
AV Peak grad: 13.2 mm[Hg]
Ao pk vel: 1.82 m/s
Area-P 1/2: 6.48 cm2
Height: 67 in
MV M vel: 3.1 m/s
MV Peak grad: 38.3 mm[Hg]
S' Lateral: 4.8 cm
Weight: 2160 [oz_av]

## 2023-07-31 LAB — CBC
HCT: 43.8 % (ref 39.0–52.0)
Hemoglobin: 14.7 g/dL (ref 13.0–17.0)
MCH: 33.8 pg (ref 26.0–34.0)
MCHC: 33.6 g/dL (ref 30.0–36.0)
MCV: 100.7 fL — ABNORMAL HIGH (ref 80.0–100.0)
Platelets: 132 10*3/uL — ABNORMAL LOW (ref 150–400)
RBC: 4.35 MIL/uL (ref 4.22–5.81)
RDW: 14.1 % (ref 11.5–15.5)
WBC: 9 10*3/uL (ref 4.0–10.5)
nRBC: 0 % (ref 0.0–0.2)

## 2023-07-31 LAB — CK
Total CK: 1730 U/L — ABNORMAL HIGH (ref 49–397)
Total CK: 1089 U/L — ABNORMAL HIGH (ref 49–397)

## 2023-07-31 LAB — BRAIN NATRIURETIC PEPTIDE: B Natriuretic Peptide: 766.6 pg/mL — ABNORMAL HIGH (ref 0.0–100.0)

## 2023-07-31 MED ORDER — MORPHINE SULFATE (PF) 4 MG/ML IV SOLN
2.5000 mg | Freq: Once | INTRAVENOUS | Status: AC
  Administered 2023-07-31: 2.5 mg via INTRAVENOUS
  Filled 2023-07-31: qty 1

## 2023-07-31 MED ORDER — ACETAMINOPHEN 325 MG PO TABS: 650.0000 mg | ORAL_TABLET | Freq: Four times a day (QID) | ORAL | Status: DC | PRN

## 2023-07-31 MED ORDER — SODIUM CHLORIDE 0.9% FLUSH
3.0000 mL | Freq: Two times a day (BID) | INTRAVENOUS | Status: DC
Start: 2023-07-31 — End: 2023-08-02
  Administered 2023-07-31 – 2023-08-02 (×6): 3 mL via INTRAVENOUS

## 2023-07-31 MED ORDER — APIXABAN 5 MG PO TABS: 5.0000 mg | ORAL_TABLET | Freq: Two times a day (BID) | ORAL | Status: DC

## 2023-07-31 MED ORDER — TECHNETIUM TC 99M MEBROFENIN IV KIT
7.8000 | PACK | Freq: Once | INTRAVENOUS | Status: AC | PRN
  Administered 2023-07-31: 7.8 via INTRAVENOUS

## 2023-07-31 MED ORDER — CARVEDILOL 3.125 MG PO TABS
3.1250 mg | ORAL_TABLET | Freq: Two times a day (BID) | ORAL | Status: DC
  Administered 2023-07-31 – 2023-08-01 (×4): 3.125 mg via ORAL
  Filled 2023-07-31 (×6): qty 1

## 2023-07-31 MED ORDER — SODIUM CHLORIDE 0.9 % IV SOLN
2.0000 g | INTRAVENOUS | Status: DC
  Administered 2023-07-31 – 2023-08-02 (×3): 2 g via INTRAVENOUS
  Filled 2023-07-31 (×3): qty 20

## 2023-07-31 MED ORDER — ONDANSETRON HCL 4 MG PO TABS: 4.0000 mg | ORAL_TABLET | Freq: Four times a day (QID) | ORAL | Status: DC | PRN

## 2023-07-31 MED ORDER — SODIUM CHLORIDE 0.9% FLUSH: 3.0000 mL | INTRAVENOUS | Status: DC | PRN

## 2023-07-31 MED ORDER — SODIUM CHLORIDE 0.9 % IV SOLN
500.0000 mg | Freq: Once | INTRAVENOUS | Status: AC
  Administered 2023-07-31: 500 mg via INTRAVENOUS
  Filled 2023-07-31: qty 5

## 2023-07-31 MED ORDER — SENNOSIDES-DOCUSATE SODIUM 8.6-50 MG PO TABS: 1.0000 | ORAL_TABLET | Freq: Every evening | ORAL | Status: DC | PRN

## 2023-07-31 MED ORDER — SODIUM CHLORIDE 0.9% FLUSH
3.0000 mL | Freq: Two times a day (BID) | INTRAVENOUS | Status: DC
  Administered 2023-07-31 – 2023-08-02 (×6): 3 mL via INTRAVENOUS

## 2023-07-31 MED ORDER — ACETAMINOPHEN 650 MG RE SUPP: 650.0000 mg | Freq: Four times a day (QID) | RECTAL | Status: DC | PRN

## 2023-07-31 MED ORDER — LEVOTHYROXINE SODIUM 25 MCG PO TABS: 25.0000 ug | ORAL_TABLET | Freq: Every day | ORAL | Status: DC

## 2023-07-31 MED ORDER — SODIUM CHLORIDE 0.45 % IV SOLN
INTRAVENOUS | Status: DC
  Filled 2023-07-31: qty 75

## 2023-07-31 MED ORDER — ONDANSETRON HCL 4 MG/2ML IJ SOLN: 4.0000 mg | Freq: Four times a day (QID) | INTRAMUSCULAR | Status: DC | PRN

## 2023-07-31 MED ORDER — IOHEXOL 350 MG/ML SOLN
75.0000 mL | Freq: Once | INTRAVENOUS | Status: AC | PRN
  Administered 2023-07-31: 75 mL via INTRAVENOUS

## 2023-07-31 MED ORDER — APIXABAN 2.5 MG PO TABS: 2.5000 mg | ORAL_TABLET | Freq: Two times a day (BID) | ORAL | Status: DC

## 2023-07-31 MED ORDER — SODIUM CHLORIDE 0.9 % IV SOLN: 250.0000 mL | INTRAVENOUS | Status: AC | PRN

## 2023-07-31 MED ORDER — APIXABAN 5 MG PO TABS
5.0000 mg | ORAL_TABLET | Freq: Two times a day (BID) | ORAL | Status: DC
  Administered 2023-07-31 – 2023-08-02 (×5): 5 mg via ORAL
  Filled 2023-07-31 (×5): qty 1

## 2023-07-31 MED ORDER — METRONIDAZOLE 500 MG/100ML IV SOLN
500.0000 mg | Freq: Two times a day (BID) | INTRAVENOUS | Status: DC
  Administered 2023-07-31 – 2023-08-01 (×4): 500 mg via INTRAVENOUS
  Filled 2023-07-31 (×4): qty 100

## 2023-07-31 MED ORDER — PERFLUTREN LIPID MICROSPHERE
1.0000 mL | INTRAVENOUS | Status: AC | PRN
  Administered 2023-07-31: 4 mL via INTRAVENOUS

## 2023-07-31 MED ORDER — LEVOTHYROXINE SODIUM 25 MCG PO TABS: 50.0000 ug | ORAL_TABLET | Freq: Every day | ORAL | Status: DC

## 2023-07-31 MED ORDER — FUROSEMIDE 10 MG/ML IJ SOLN
20.0000 mg | Freq: Every day | INTRAMUSCULAR | Status: DC
  Administered 2023-07-31 – 2023-08-01 (×2): 20 mg via INTRAVENOUS
  Filled 2023-07-31 (×2): qty 2

## 2023-07-31 NOTE — Progress Notes (Signed)
 Rhabdomyolysis Metabolic acidosis History of CHF reduced EF 30 to 35% As the bicarb level progressively worsening changing IV fluid LR to bicarb drip at 50 cc/h for 1 day.  Tereasa Coop, MD [facility] 07/31/2023, 6:55 AM

## 2023-07-31 NOTE — Progress Notes (Signed)
  Echocardiogram 2D Echocardiogram has been performed.  Lucendia Herrlich 07/31/2023, 4:08 PM

## 2023-07-31 NOTE — Progress Notes (Signed)
 Courtesy visit:  Patient is seen and examined today morning. He is admitted for evaluation of fall. Found to have acalculous cholecystitis with bili 2.9, AST 84, rhabdo. He has h/o systolic CHF with EF 30-35%.HIDA scan is pending. Cardiology consulted for elevated troponin. He is weak, able to answer me. Eating poor. IV fluids held due to h/o heart failure, seems euvolemic. If HIDA shows any obstruction GI will be advised to see the patient. Follow cardiology recommendations. Will get PT/ OT for disposition.

## 2023-07-31 NOTE — ED Notes (Signed)
 Patient transported to CT

## 2023-07-31 NOTE — ED Notes (Signed)
 Dr. Janalyn Shy notified of critical troponin

## 2023-07-31 NOTE — Progress Notes (Signed)
 PT Cancellation Note  Patient Details Name: Lucas Fuller MRN: [id number] DOB: 02-Apr-1941   Cancelled Treatment:    Reason Eval/Treat Not Completed: (P) Patient at procedure or test/unavailable. At IR. PT will continue to follow as schedule allows.   Theo Ferretti, PT, DPT Acute Rehabilitation Services  Office: [phone]    Theo CHRISTELLA Ferretti 07/31/2023, 9:36 AM

## 2023-07-31 NOTE — Progress Notes (Signed)
 OT Cancellation Note  Patient Details Name: Lucas Fuller MRN: [id number] DOB: 1940-12-01   Cancelled Treatment:    Reason Eval/Treat Not Completed: Patient at procedure or test/ unavailable (IR). OT will continue to follow as schedule allows. Per RN, PT is now conversational with improves mental status from initial admission to ED  Leita JINNY Odea 07/31/2023, 9:22 AM  Leita DEL OTR/L Acute Rehabilitation Services Office: [phone]

## 2023-07-31 NOTE — H&P (Addendum)
 History and Physical    Lucas Fuller FMW:[id number] DOB: [birth year] DOA: 07/30/2023  PCP: Ransom Other, MD   Patient coming from: Home   Chief Complaint:  Chief Complaint  Patient presents with   Fall   ED TRIAGE note:Pt BIB EMS from home, pt was found on the floor by family. Reports the pt has not been acting right since 12/31. Pt unsteady on feet. EMS reports the pt lives alone, has started using a walker to ambulate. Pt unable to tell staff why he is here, when asked what happened pt states nothing. Strong smell of urine noted.   HPI:  Lucas Fuller is a 83 y.o. 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  Patient is still currently living independently being brought in today by EMS after being found on the floor in his home and not able to get up. Spoke with patient's niece Erminio who gave all of the history. She reports that she goes and checks on him as often as she can and tries to remind him to take his medications. She reports that she last saw him on 07/25/2023 and at that time he seemed to be at his baseline although she does admit that he has been declining. She reports that the neighbor goes and checks on him regularly and does not feel that he had been in the floor for very long. He was in the living room and looks like he had fallen while trying to get in his to his chair. The walker was close by. No other history is available and patient is not able to answer many questions except yes and no. He says no to chest pain, shortness of breath and abdominal pain. There was no evidence of any emesis but patient was soiled in stool and urine.    ED Course:  Presentation to ED patient  is hemodynamically stable. CBC unremarkable. CMP showed low bicarb 18, elevated bilirubin 2.9, elevated AST 84 elevated anion gap 17. Elevated troponin 83>79>89>106. EKG showed atrial fibrillation heart rate 153.  Normal ammonia. Elevated TSH 6.55 and elevated T41.23 UA amber appearance, bilirubin positive, ketone positive, protein 100 and bacteria rare. VBG showed pH 7.3, pCO2 40, pO2 36 and potassium 6.2. Elevated CK 1730.  CTA chest showed: IMPRESSION: 1. Negative for acute pulmonary embolism. 2. Cardiomegaly with elevated right heart pressures. 3. Ground-glass opacities and interlobular septal thickening with trace bilateral pleural effusions. Prominent differential considerations are pulmonary edema and atypical pneumonia. 4. Anasarca with small volume abdominopelvic ascites. 5. Bladder wall thickening with perivesical stranding. Correlate with urinalysis to exclude cystitis. 6. Cortical geographic hypoattenuation in the kidneys could be due to chronic scarring or pyelonephritis. 7. Questionable nodular hepatic contour which can be seen with cirrhosis.  Chest x-ray no active disease.  Cardiomegaly.  CT head no obvious hemorrhage or mass effect.  Atrophy and chronic small vessel ischemic changes.  Prior chronic right patulin fraction.  As patient was complaining about abdominal pain and CMP showed elevated bilirubin right upper quadrant ultrasound obtained which showed sludge filled gallbladder with increased thickness and trace pericholecystic fluid but negative Murphy sign. Pending HIDA scan. While patient in the ED getting more confused per Dr. Melvenia report.  In the ED patient has been treated with ceftriaxone   and azithromycin  for concern for pneumonia versus acalculous cholecystitis.  Hospitalist has been contacted for further evaluation management of acalculous cholecystitis, fall, hyperbilirubinemia, elevated troponin, and rhabdomyolysis.  Significant labs in the  ED: Lab Orders         Culture, blood (Routine X 2) w Reflex to ID Panel         CBC with Differential/Platelet         Comprehensive metabolic panel         Urinalysis, w/ Reflex to Culture (Infection Suspected) -Urine, Clean Catch         Magnesium         Ammonia         Brain natriuretic peptide         TSH         T4, free         CK         Comprehensive metabolic panel         CBC         TSH         I-Stat venous blood gas, ED       Review of Systems:  Review of Systems  Unable to perform ROS: Acuity of condition  Constitutional:  Negative for chills, fever, malaise/fatigue and weight loss.  Respiratory:  Negative for cough and shortness of breath.   Cardiovascular:  Negative for chest pain and palpitations.  Gastrointestinal:  Negative for heartburn and nausea.  Neurological:  Positive for headaches.    Past Medical History:  Diagnosis Date   Acute diastolic congestive heart failure (HCC)    Acute systolic CHF (congestive heart failure) (HCC)    Atrial fibrillation, persistent (HCC)    Atrial flutter (HCC) 12/05/2015   Bladder cancer (HCC)    Cardiomyopathy (HCC)    CHF (congestive heart failure) (HCC) 12/05/2015   Chronic systolic CHF (congestive heart failure) (HCC)    Edema 12/05/2015   Elevated brain natriuretic peptide (BNP) level    Essential hypertension 06/24/2018   ETOH abuse 12/05/2015   History of blood transfusion    Hypothyroidism    Lumbar disc disease 12/05/2015   Malnutrition of moderate degree 12/07/2015   Migraine    years ago (05/17/2016)   Non-ischemic cardiomyopathy (HCC)    Paroxysmal atrial fibrillation (HCC)    Pneumonia due to COVID-19 virus 08/18/2019   Protein calorie malnutrition (HCC) 12/05/2015   SOB (shortness of breath) 12/05/2015   Thrombocytopenia (HCC) 12/05/2015   Thyroid  disease    Weight loss 12/05/2015    Past Surgical History:  Procedure Laterality Date   CARDIAC CATHETERIZATION N/A 12/08/2015   Procedure: Left Heart  Cath and Coronary Angiography;  Surgeon: Dorn JINNY Lesches, MD;  Location: [facility] INVASIVE CV LAB;  Service: Cardiovascular;  Laterality: N/A;   CYSTOSCOPY WITH FULGERATION  2008   thelbert 11/23/2010   ELECTROPHYSIOLOGIC STUDY N/A 05/17/2016   Procedure: A-Flutter Ablation;  Surgeon: Danelle LELON Birmingham, MD;  Location: [facility] INVASIVE CV LAB;  Service: Cardiovascular;  Laterality: N/A;   TONSILLECTOMY     TRANSURETHRAL RESECTION OF BLADDER  2008   /notes 11/23/2010     reports that he has never smoked. He has never used smokeless tobacco. He reports that he does not currently use alcohol  after a past usage of about 6.0 standard drinks of alcohol  per week. No history on file for drug use.  Allergies  Allergen Reactions   Asa [Aspirin ] Hives    Pt can tolerate 81 mg with  no issues    Family History  Problem Relation Age of Onset   Heart attack Father 7   Kidney disease Sister    Hypertension Sister    Lung cancer Sister    CVA Sister    Diabetes Sister     Prior to Admission medications   Medication Sig Start Date End Date Taking? Authorizing Provider  acetaminophen  (TYLENOL ) 500 MG tablet Take 1,000 mg by mouth every 6 (six) hours as needed for moderate pain or mild pain.    [provider]  albuterol  (VENTOLIN  HFA) 108 (90 Base) MCG/ACT inhaler Inhale 2 puffs into the lungs every 4 (four) hours as needed for wheezing or shortness of breath. 08/22/19   Akula, Vijaya, MD  apixaban  (ELIQUIS ) 5 MG TABS tablet Take 1 tablet (5 mg total) by mouth 2 (two) times daily. 08/27/21   Bryn Bernardino NOVAK, MD  atorvastatin  (LIPITOR) 20 MG tablet Take 1 tablet (20 mg total) by mouth daily. Patient not taking: Reported on 07/30/2023 11/21/21   Lenetta No D, NP  carvedilol  (COREG ) 3.125 MG tablet Take 1 tablet by mouth in the morning and at bedtime.    [provider]  furosemide  (LASIX ) 40 MG tablet Take 1 tablet (40 mg total) by mouth daily. 12/18/19   Ursuy, Renee Lynn, PA-C  levothyroxine  (SYNTHROID ,  LEVOTHROID) 50 MCG tablet Take 50 mcg by mouth daily before breakfast.    [provider]  lisinopril  (ZESTRIL ) 2.5 MG tablet TAKE 1 TABLET BY MOUTH EVERY DAY Patient not taking: Reported on 07/30/2023 01/29/22   Waddell Danelle ORN, MD  oxybutynin (DITROPAN-XL) 5 MG 24 hr tablet Take 5 mg by mouth daily.    [provider]  potassium chloride  (KLOR-CON ) 20 MEQ packet Take 20 mEq by mouth daily. 12/04/21   [provider]  spironolactone  (ALDACTONE ) 25 MG tablet Take 0.5 tablets (12.5 mg total) by mouth daily. Patient not taking: Reported on 07/30/2023 11/10/21 05/08/22  Rosario Eland I, MD     Physical Exam: Vitals:   07/31/23 0030 07/31/23 0045 07/31/23 0100 07/31/23 0445  BP: 120/89 (!) 112/91 113/70 102/76  Pulse: (!) 40 89 92 66  Resp: 17 12 12 16   Temp:   98.1 F (36.7 C) 98.5 F (36.9 C)  TempSrc:    Axillary  SpO2: 100% 100% 94% 100%  Weight:      Height:        Physical Exam Constitutional:      Appearance: He is ill-appearing.  HENT:     Mouth/Throat:     Mouth: Mucous membranes are moist.  Eyes:     Conjunctiva/sclera: Conjunctivae normal.  Cardiovascular:     Rate and Rhythm: Tachycardia present. Rhythm irregular.     Pulses: Normal pulses.     Heart sounds: Normal heart sounds.  Pulmonary:     Effort: Pulmonary effort is normal.     Breath sounds: Normal breath sounds.  Abdominal:     General: Bowel sounds are normal.     Palpations: Abdomen is soft.  Musculoskeletal:     Cervical back: Neck supple.     Right lower leg: No edema.     Left lower leg: No edema.  Skin:    General: Skin is dry.     Capillary Refill: Capillary refill takes less than 2 seconds.  Neurological:     Mental Status: He is alert.     Comments: Patient is alert to name only.  Psychiatric:     Comments:  Unable to assess      Labs on Admission: I have personally reviewed following labs and imaging studies  CBC: Recent Labs  Lab 07/30/23 1913  07/30/23 2003 07/31/23 0402  WBC 9.7  --  9.0  NEUTROABS 8.1*  --   --   HGB 14.9 15.0 14.7  HCT 41.9 44.0 43.8  MCV 98.8  --  100.7*  PLT 199  --  132*   Basic Metabolic Panel: Recent Labs  Lab 07/30/23 1913 07/30/23 2003 07/31/23 0402  NA 137 136 135  K 4.7 6.2* 4.7  CL 102  --  104  CO2 18*  --  17*  GLUCOSE 93  --  83  BUN 19  --  18  CREATININE 0.92  --  0.91  CALCIUM  8.9  --  8.4*  MG 2.1  --   --    GFR: Estimated Creatinine Clearance: 53.2 mL/min (by C-G formula based on SCr of 0.91 mg/dL). Liver Function Tests: Recent Labs  Lab 07/30/23 1913 07/31/23 0402  AST 84* 81*  ALT 21 18  ALKPHOS 50 45  BILITOT 2.9* 2.5*  PROT 7.5 6.8  ALBUMIN 3.1* 2.7*   No results for input(s): LIPASE, AMYLASE in the last 168 hours. Recent Labs  Lab 07/30/23 1913  AMMONIA 26   Coagulation Profile: No results for input(s): INR, PROTIME in the last 168 hours. Cardiac Enzymes: Recent Labs  Lab 07/30/23 1913 07/30/23 2135 07/31/23 0030 07/31/23 0402 07/31/23 0450  CKTOTAL  --   --  1,730*  --   --   TROPONINIHS 83* 79*  --  89* 106*   BNP (last 3 results) No results for input(s): BNP in the last 8760 hours. HbA1C: No results for input(s): HGBA1C in the last 72 hours. CBG: No results for input(s): GLUCAP in the last 168 hours. Lipid Profile: No results for input(s): CHOL, HDL, LDLCALC, TRIG, CHOLHDL, LDLDIRECT in the last 72 hours. Thyroid  Function Tests: Recent Labs    07/30/23 1913 07/30/23 2135  TSH 6.559*  --   FREET4  --  1.23*   Anemia Panel: No results for input(s): VITAMINB12, FOLATE, FERRITIN, TIBC, IRON, RETICCTPCT in the last 72 hours. Urine analysis:    Component Value Date/Time   COLORURINE AMBER (A) 07/30/2023 1952   APPEARANCEUR HAZY (A) 07/30/2023 1952   LABSPEC 1.027 07/30/2023 1952   PHURINE 5.0 07/30/2023 1952   GLUCOSEU NEGATIVE 07/30/2023 1952   HGBUR NEGATIVE 07/30/2023 1952   BILIRUBINUR  SMALL (A) 07/30/2023 1952   KETONESUR 5 (A) 07/30/2023 1952   PROTEINUR 100 (A) 07/30/2023 1952   UROBILINOGEN 0.2 06/22/2009 0723   NITRITE NEGATIVE 07/30/2023 1952   LEUKOCYTESUR NEGATIVE 07/30/2023 1952    Radiological Exams on Admission: I have personally reviewed images CT Angio Chest PE W and/or Wo Contrast Result Date: 07/31/2023 CLINICAL DATA:  Patient found down by family. Altered mental status since 12/31. Unsteady on feet. Blunt abdominal trauma. Abdominal pain. EXAM: CT ANGIOGRAPHY CHEST CT ABDOMEN AND PELVIS WITH CONTRAST TECHNIQUE: Multidetector CT imaging of the chest was performed using the standard protocol during bolus administration of intravenous contrast. Multiplanar CT image reconstructions and MIPs were obtained to evaluate the vascular anatomy. Multidetector CT imaging of the abdomen and pelvis was performed using the standard protocol during bolus administration of intravenous contrast. RADIATION DOSE REDUCTION: This exam was performed according to the departmental dose-optimization program which includes automated exposure control, adjustment of the mA and/or kV according to patient size and/or use of iterative  reconstruction technique. CONTRAST:  75mL OMNIPAQUE  IOHEXOL  350 MG/ML SOLN COMPARISON:  Ultrasound 07/30/2023; chest radiograph 07/30/2023; CTA chest 11/08/2021; CT abdomen and pelvis 09/21/2015 FINDINGS: CTA CHEST FINDINGS Cardiovascular: Cardiomegaly. No pericardial effusion. Reflux of contrast into the IVC and hepatic veins compatible with elevated right heart pressures. Negative for acute pulmonary embolism. Coronary artery and aortic atherosclerotic calcification. Mediastinum/Nodes: Trachea and esophagus are unremarkable. No thoracic adenopathy. Lungs/Pleura: Patchy bilateral ground-glass opacities with interlobular septal thickening. Diffuse bronchial wall thickening. Trace bilateral pleural effusions. No pneumothorax. Musculoskeletal: No acute fracture. Review of the  MIP images confirms the above findings. CT ABDOMEN and PELVIS FINDINGS Hepatobiliary: Respiratory motion limits assessment of the upper abdomen. Questionable nodular hepatic contour which can be seen with cirrhosis. No definite gallbladder abnormality though motion limits assessment of the gallbladder wall. No definite biliary dilation. Pancreas: Unremarkable. Spleen: Unremarkable. Adrenals/Urinary Tract: Stable adrenal glands. Cortical geographic hypoattenuation in the kidneys could be due to chronic scarring or pyelonephritis. No urinary calculi or hydronephrosis. Bladder wall thickening with perivesical stranding. Stomach/Bowel: Normal caliber large and small bowel. Stool ball in the rectum. Stomach and appendix are within normal limits. Vascular/Lymphatic: Aortic atherosclerosis. No enlarged abdominal or pelvic lymph nodes. Reproductive: Unremarkable. Other: Mesenteric and body wall edema. Small volume abdominopelvic ascites. No free intraperitoneal air. Musculoskeletal: No acute fracture. Review of the MIP images confirms the above findings. IMPRESSION: 1. Negative for acute pulmonary embolism. 2. Cardiomegaly with elevated right heart pressures. 3. Ground-glass opacities and interlobular septal thickening with trace bilateral pleural effusions. Prominent differential considerations are pulmonary edema and atypical pneumonia. 4. Anasarca with small volume abdominopelvic ascites. 5. Bladder wall thickening with perivesical stranding. Correlate with urinalysis to exclude cystitis. 6. Cortical geographic hypoattenuation in the kidneys could be due to chronic scarring or pyelonephritis. 7. Questionable nodular hepatic contour which can be seen with cirrhosis. Aortic Atherosclerosis (ICD10-I70.0). Electronically Signed   By: Norman Gatlin M.D.   On: 07/31/2023 02:04   CT ABDOMEN PELVIS W CONTRAST Result Date: 07/31/2023 CLINICAL DATA:  Patient found down by family. Altered mental status since 12/31. Unsteady on  feet. Blunt abdominal trauma. Abdominal pain. EXAM: CT ANGIOGRAPHY CHEST CT ABDOMEN AND PELVIS WITH CONTRAST TECHNIQUE: Multidetector CT imaging of the chest was performed using the standard protocol during bolus administration of intravenous contrast. Multiplanar CT image reconstructions and MIPs were obtained to evaluate the vascular anatomy. Multidetector CT imaging of the abdomen and pelvis was performed using the standard protocol during bolus administration of intravenous contrast. RADIATION DOSE REDUCTION: This exam was performed according to the departmental dose-optimization program which includes automated exposure control, adjustment of the mA and/or kV according to patient size and/or use of iterative reconstruction technique. CONTRAST:  75mL OMNIPAQUE  IOHEXOL  350 MG/ML SOLN COMPARISON:  Ultrasound 07/30/2023; chest radiograph 07/30/2023; CTA chest 11/08/2021; CT abdomen and pelvis 09/21/2015 FINDINGS: CTA CHEST FINDINGS Cardiovascular: Cardiomegaly. No pericardial effusion. Reflux of contrast into the IVC and hepatic veins compatible with elevated right heart pressures. Negative for acute pulmonary embolism. Coronary artery and aortic atherosclerotic calcification. Mediastinum/Nodes: Trachea and esophagus are unremarkable. No thoracic adenopathy. Lungs/Pleura: Patchy bilateral ground-glass opacities with interlobular septal thickening. Diffuse bronchial wall thickening. Trace bilateral pleural effusions. No pneumothorax. Musculoskeletal: No acute fracture. Review of the MIP images confirms the above findings. CT ABDOMEN and PELVIS FINDINGS Hepatobiliary: Respiratory motion limits assessment of the upper abdomen. Questionable nodular hepatic contour which can be seen with cirrhosis. No definite gallbladder abnormality though motion limits assessment of the gallbladder wall. No definite biliary dilation. Pancreas:  Unremarkable. Spleen: Unremarkable. Adrenals/Urinary Tract: Stable adrenal glands. Cortical  geographic hypoattenuation in the kidneys could be due to chronic scarring or pyelonephritis. No urinary calculi or hydronephrosis. Bladder wall thickening with perivesical stranding. Stomach/Bowel: Normal caliber large and small bowel. Stool ball in the rectum. Stomach and appendix are within normal limits. Vascular/Lymphatic: Aortic atherosclerosis. No enlarged abdominal or pelvic lymph nodes. Reproductive: Unremarkable. Other: Mesenteric and body wall edema. Small volume abdominopelvic ascites. No free intraperitoneal air. Musculoskeletal: No acute fracture. Review of the MIP images confirms the above findings. IMPRESSION: 1. Negative for acute pulmonary embolism. 2. Cardiomegaly with elevated right heart pressures. 3. Ground-glass opacities and interlobular septal thickening with trace bilateral pleural effusions. Prominent differential considerations are pulmonary edema and atypical pneumonia. 4. Anasarca with small volume abdominopelvic ascites. 5. Bladder wall thickening with perivesical stranding. Correlate with urinalysis to exclude cystitis. 6. Cortical geographic hypoattenuation in the kidneys could be due to chronic scarring or pyelonephritis. 7. Questionable nodular hepatic contour which can be seen with cirrhosis. Aortic Atherosclerosis (ICD10-I70.0). Electronically Signed   By: Norman Gatlin M.D.   On: 07/31/2023 02:04   US  Abdomen Limited RUQ (LIVER/GB) Result Date: 07/30/2023 CLINICAL DATA:  Transaminitis EXAM: ULTRASOUND ABDOMEN LIMITED RIGHT UPPER QUADRANT COMPARISON:  CT 09/21/2015 FINDINGS: Gallbladder: Sludge filled gallbladder. Increased wall thickness at 4.9 mm. Negative sonographic Murphy. Trace pericholecystic fluid Common bile duct: Diameter: 4.7 mm Liver: No focal hepatic abnormality. Echogenicity within normal limits. Trace perihepatic ascites. Questionable surface nodularity of the liver portal vein is patent on color Doppler imaging with normal direction of blood flow towards the  liver. Other: Trace perihepatic ascites IMPRESSION: 1. Sludge filled gallbladder with increased wall thickness and trace pericholecystic fluid but negative sonographic Murphy. If acute gallbladder disease is a concern, correlation with nuclear medicine hepatobiliary imaging could be obtained. 2. Trace perihepatic ascites. Questionable surface nodularity of the liver, correlate for cirrhosis risk factors. Electronically Signed   By: Luke Bun M.D.   On: 07/30/2023 22:31   CT Head Wo Contrast Result Date: 07/30/2023 CLINICAL DATA:  Mental status change EXAM: CT HEAD WITHOUT CONTRAST TECHNIQUE: Contiguous axial images were obtained from the base of the skull through the vertex without intravenous contrast. RADIATION DOSE REDUCTION: This exam was performed according to the departmental dose-optimization program which includes automated exposure control, adjustment of the mA and/or kV according to patient size and/or use of iterative reconstruction technique. COMPARISON:  CT brain 06/18/2022 trauma MRI 08/26/2021 FINDINGS: Brain: Motion degradation limits the exam. No gross hemorrhage or intracranial mass. Atrophy and chronic small vessel ischemic changes of the white matter. Chronic right parietal infarct. Interval right posterior frontal infarct, series 3, image 25, suspect chronic but new from prior head CT. Interval hypodensity at the right posterior temporal and parietal lobes consistent with infarct, probably chronic but limited by motion degradation and more extensive when compared to the CT from November of 2023. Ventricles are nonenlarged. Vascular: No hyperdense vessels.  Carotid vascular calcification Skull: Normal. Negative for fracture or focal lesion. Sinuses/Orbits: Moderate mucosal thickening within the paranasal sinuses. Other: None IMPRESSION: 1. Significantly limited by motion degradation. No obvious hemorrhage, mass or mass effect. 2. Atrophy and chronic small vessel ischemic changes of the  white matter 3. Prior chronic right parietal infarct. Interval right posterior frontal and right temporoparietal areas of suspected infarction, suspect chronic but new as compared with head CT from 2023 and assessment is limited by significant motion degradation. Electronically Signed   By: Luke Bun HERO.D.  On: 07/30/2023 20:59   DG Chest Port 1 View Result Date: 07/30/2023 CLINICAL DATA:  Altered mental status EXAM: PORTABLE CHEST 1 VIEW COMPARISON:  Chest x-ray 11/13/2022 FINDINGS: The heart is enlarged, unchanged. There is no pleural effusion or pneumothorax. Both lungs are clear. The visualized skeletal structures are unremarkable. IMPRESSION: 1. No active disease. 2. Cardiomegaly. Electronically Signed   By: Greig Pique M.D.   On: 07/30/2023 19:29     EKG: My personal interpretation of EKG shows:  Initial EKG showed atrial fibrillation heart rate 152 and wide-complex tachycardia heart rate 142.    Assessment/Plan: Principal Problem:   Acalculous cholecystitis Active Problems:   Acute metabolic encephalopathy   Elevated bilirubin   Fall at home, initial encounter   Rhabdomyolysis   Hypothyroidism   Combined 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)   Paroxysmal atrial fibrillation (HCC)   Essential hypertension   History of CVA (cerebrovascular accident)   History of bladder cancer   Elevated troponin    Assessment and Plan: Acalculous cholecystitis > Patient presented to the emergency department with mechanical fall.  Unknown downtime.  Patient is poor historian.  Currently he is complaining about headache. - CMP showed transaminitis 84 and elevated total bilirubin 2.9. - Right upper quadrant ultrasound showed gallbladder filled with sludge with increased thickness and trace pericholecystic fluid with negative sonographic Murphy.  Trace perihepatic ascites. -Patient is afebrile and no leukocytosis. - Obtaining HIDA scan. - Consulted [facility] Dr.Pyrtle for a  nonurgent consult evaluation in the daytime. -In the ED patient received ceftriaxone  1 g.  Plan to continue ceftriaxone  2 g daily and metronidazole  500 mg twice daily - Obtaining blood cultures. - Will follow-up with gastroenterology recommendation and HIDA scan result.  Acute metabolic encephalopathy Mechanical fall -Patient presenting with confusion.  Unknown baseline.  He is a historian.  Only answering to question yes or no. - CT head showed significant motion degraded degeneration, no obvious hemorrhage, mass effect.  Atrophy and chronic ischemic change.  Prior chronic right parietal infraction. Interval right posterior frontal and right temporoparietal areas of suspected infarction, suspect chronic but new as compared with head CT from 2023 - Acute metabolic encephalopathy multifactorial include mechanical fall which might have been provoked acute cholecystitis and now patient has rhabdomyolysis. - Currently managing for rhabdomyolysis, I have cholecystitis. - Continue fall precaution. -Elevated TSH and T4.  Ammonia 26 with normal range. -Continue neurocheck every 4 hours. - Continue cardiac monitoring for arrhythmia. - Consulted PT and OT for evaluation of balance. -Patient lives by himself.  To my judgment it is not safe to discharge patient to home, patient might need SNF versus long-term facility care if he [place] allows.  Nontraumatic rhabdomyolysis -Slightly elevated CK 1730.  Rhabdomyolysis secondary to fall.  Plan to continue gentle hydration with LR 75 cc/h for 10 hours.  Patient has history of CHF with reduced EF 30 to 35%.  Continue gentle hydration   Hypothyroidism -Patient found to have elevated TSH 6.559 and T 1.23 -Initial EKG showed atrial fibrillation 100 153 and repeat EKG showed wide-complex tachycardia heart rate 144.  Holding levothyroxine  in the setting of persistent tachycardia. -Need to repeat TSH and T4 again in 3 to 4 weeks before reinitiating  levothyroxine .    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 -CTA chest no evidence of PE however it showed cardiomegaly with elevated right heart pressure.  Groundglass opacities and interlobular septal thickness with trace bilateral pleural effusion. -Pending BNP.  Patient is evolving on physical exam. -Initially on presentation to ED patient was hypertensive however blood pressure gradually started to drop to 102/76. -Echo from 11/09/2021 showed EF 30 to 35% moderate concentric left ventricle hypertrophy and diastolic heart failure. - Currently restarting Coreg  3.125 mg twice daily .  And once blood pressure allow will resume spironolactone .    Holding Lasix  as blood pressure is borderline soft and patient is euvolemic.   Elevated troponin - Initial troponin 83 which has been gradually trending up to 106.  Patient denies any chest pressure, chest pain shortness of breath - EKG showing atrial fibrillation and repeat EKG showing sinus tachycardia.  There is no history of abnormality. -Heart rate has been improved to 78 to upper 90s range. -Elevated troponin secondary to demand ischemia in the context of acute cholecystitis, fall and persistent atrial fibrillation. - Continue to trending troponin. - Obtaining echocardiogram to assess any wall motion abnormality.  If echo shows any wall motion abnormality need to reach out to cardiology for evaluation. - Continue cardiac monitoring.  History of paroxysmal atrial fibrillation - Patient reported taking Eliquis  only at home. - CHA2DS2-VASc score = 6.  High risk of stroke. - Restarting Coreg  3.125 mg to give it now from this morning. -Continue Eliquis  5 mg twice daily. -Also patient is coming with fall.  Which is high risk for development of intracranial hemorrhage with concurrent use of Eliquis . -Continue cardiac  monitoring Addendum - Given patient is having wide-complex tachycardia and troponin continuing to peaking up called and consulting cardiology for evaluation.  History bladder cancer -Unable to find any recent records on the chart.   History of CVA -Holding Lipitor in the setting of rhabdomyolysis.  Currently on Eliquis  also for the management of atrial fibrillation.    DVT prophylaxis:  Eliquis  Code Status:  Full Code-by default Diet: Heart healthy diet Family Communication:   None present at the bedside Disposition Plan: Pending HIDA scan and blood cultures. Consults: PT and OT Admission status:   Inpatient, Telemetry bed  Severity of Illness: The appropriate patient status for this patient is INPATIENT. Inpatient status is judged to be reasonable and necessary in order to provide the required intensity of service to ensure the patient's safety. The patient's presenting symptoms, physical exam findings, and initial radiographic and laboratory data in the context of their chronic comorbidities is felt to place them at high risk for further clinical deterioration. Furthermore, it is not anticipated that the patient will be medically stable for discharge from the hospital within 2 midnights of admission.   * I certify that at the point of admission it is my clinical judgment that the patient will require inpatient hospital care spanning beyond 2 midnights from the point of admission due to high intensity of service, high risk for further deterioration and high frequency of surveillance required.DEWAINE    Arwin Bisceglia, MD [facility]  How to contact the [facility] Attending or Consulting provider 7A - 7P or covering provider during after hours 7P -7A, for this patient.  Check the care team in [facility] and look for a) attending/consulting [facility] provider listed and b) the [facility] team listed Log into [email or web address] and use [place]'s universal password to access. If you do not have the password,  please contact the hospital operator. Locate the [facility] provider you are looking for under [facility] and page to a number that you can be directly  reached. If you still have difficulty reaching the provider, please page the [facility] (Director on Call) for the Hospitalists listed on amion for assistance.  07/31/2023, 6:21 AM

## 2023-07-31 NOTE — ED Notes (Signed)
 ED TO INPATIENT HANDOFF REPORT  ED Nurse Name and Phone #: Dorthea PEAK [phone]  S Name/Age/Gender Lucas Fuller Rea 83 y.o. male Room/Bed: 036C/036C  Code Status   Code Status: Full Code  Home/SNF/Other Home Patient oriented to: self Is this baseline?  Family states pt has been noted with AMS  Triage Complete: Triage complete  Chief Complaint Acalculous cholecystitis [K81.9]  Triage Note Pt BIB EMS from home, pt was found on the floor by family. Reports the pt has not been acting right since 12/31. Pt unsteady on feet. EMS reports the pt lives alone, has started using a walker to ambulate. Pt unable to tell staff why he is here, when asked what happened pt states nothing. Strong smell of urine noted.    Allergies Allergies  Allergen Reactions   Asa [Aspirin ] Hives    Pt can tolerate 81 mg with no issues    Level of Care/Admitting Diagnosis ED Disposition     ED Disposition  Admit   Condition  --   Comment  Hospital [facility]: [facility] [[id number]]  Level of Care: Telemetry Cardiac [103]  May admit patient to [facility] or [facility] if equivalent level of care is available:: No  Covid Evaluation: Asymptomatic - no recent exposure (last 10 days) testing not required  Diagnosis: Acalculous cholecystitis [171909]  Admitting Physician: SUNDIL, SUBRINA [[id number]]  Attending Physician: SUNDIL, SUBRINA [[id number]]  Certification:: I certify this patient will need inpatient services for at least 2 midnights  Expected Medical Readiness: 08/06/2023          B Medical/Surgery History Past Medical History:  Diagnosis Date   Acute diastolic congestive heart failure (HCC)    Acute systolic CHF (congestive heart failure) (HCC)    Atrial fibrillation, persistent (HCC)    Atrial flutter (HCC) 12/05/2015   Bladder cancer (HCC)    Cardiomyopathy (HCC)    CHF (congestive heart failure) (HCC) 12/05/2015   Chronic systolic CHF (congestive heart failure) (HCC)     Edema 12/05/2015   Elevated brain natriuretic peptide (BNP) level    Essential hypertension 06/24/2018   ETOH abuse 12/05/2015   History of blood transfusion    Hypothyroidism    Lumbar disc disease 12/05/2015   Malnutrition of moderate degree 12/07/2015   Migraine    years ago (05/17/2016)   Non-ischemic cardiomyopathy (HCC)    Paroxysmal atrial fibrillation (HCC)    Pneumonia due to COVID-19 virus 08/18/2019   Protein calorie malnutrition (HCC) 12/05/2015   SOB (shortness of breath) 12/05/2015   Thrombocytopenia (HCC) 12/05/2015   Thyroid  disease    Weight loss 12/05/2015   Past Surgical History:  Procedure Laterality Date   CARDIAC CATHETERIZATION N/A 12/08/2015   Procedure: Left Heart Cath and Coronary Angiography;  Surgeon: Dorn JINNY Lesches, MD;  Location: [facility] INVASIVE CV LAB;  Service: Cardiovascular;  Laterality: N/A;   CYSTOSCOPY WITH FULGERATION  2008   thelbert 11/23/2010   ELECTROPHYSIOLOGIC STUDY N/A 05/17/2016   Procedure: A-Flutter Ablation;  Surgeon: Danelle LELON Birmingham, MD;  Location: [facility] INVASIVE CV LAB;  Service: Cardiovascular;  Laterality: N/A;   TONSILLECTOMY     TRANSURETHRAL RESECTION OF BLADDER  2008   thelbert 11/23/2010     A IV Location/Drains/Wounds Patient Lines/Drains/Airways Status     Active Line/Drains/Airways     Name Placement date Placement time Site Days   Peripheral IV 07/30/23 20 G Right Antecubital 07/30/23  1946  Antecubital  1  Intake/Output Last 24 hours  Intake/Output Summary (Last 24 hours) at 07/31/2023 1233 Last data filed at 07/31/2023 1229 Gross per 24 hour  Intake 1006.53 ml  Output --  Net 1006.53 ml    Labs/Imaging Results for orders placed or performed during the hospital encounter of 07/30/23 (from the past 48 hours)  CBC with Differential/Platelet     Status: Abnormal   Collection Time: 07/30/23  7:13 PM  Result Value Ref Range   WBC 9.7 4.0 - 10.5 K/uL   RBC 4.24 4.22 - 5.81 MIL/uL   Hemoglobin 14.9 13.0 - 17.0 g/dL    HCT 58.0 60.9 - 47.9 %   MCV 98.8 80.0 - 100.0 fL   MCH 35.1 (H) 26.0 - 34.0 pg   MCHC 35.6 30.0 - 36.0 g/dL   RDW 85.5 88.4 - 84.4 %   Platelets 199 150 - 400 K/uL    Comment: REPEATED TO VERIFY   nRBC 0.0 0.0 - 0.2 %   Neutrophils Relative % 84 %   Neutro Abs 8.1 (H) 1.7 - 7.7 K/uL   Lymphocytes Relative 8 %   Lymphs Abs 0.8 0.7 - 4.0 K/uL   Monocytes Relative 7 %   Monocytes Absolute 0.7 0.1 - 1.0 K/uL   Eosinophils Relative 0 %   Eosinophils Absolute 0.0 0.0 - 0.5 K/uL   Basophils Relative 1 %   Basophils Absolute 0.1 0.0 - 0.1 K/uL   Immature Granulocytes 0 %   Abs Immature Granulocytes 0.03 0.00 - 0.07 K/uL    Comment: Performed at [facility], [address], [place], [place] [address]  Comprehensive metabolic panel     Status: Abnormal   Collection Time: 07/30/23  7:13 PM  Result Value Ref Range   Sodium 137 135 - 145 mmol/L   Potassium 4.7 3.5 - 5.1 mmol/L   Chloride 102 98 - 111 mmol/L   CO2 18 (L) 22 - 32 mmol/L   Glucose, Bld 93 70 - 99 mg/dL    Comment: Glucose reference range applies only to samples taken after fasting for at least 8 hours.   BUN 19 8 - 23 mg/dL   Creatinine, Ser 9.07 0.61 - 1.24 mg/dL   Calcium  8.9 8.9 - 10.3 mg/dL   Total Protein 7.5 6.5 - 8.1 g/dL   Albumin 3.1 (L) 3.5 - 5.0 g/dL   AST 84 (H) 15 - 41 U/L   ALT 21 0 - 44 U/L   Alkaline Phosphatase 50 38 - 126 U/L   Total Bilirubin 2.9 (H) 0.0 - 1.2 mg/dL   GFR, Estimated >39 >39 mL/min    Comment: (NOTE) Calculated using the CKD-EPI Creatinine Equation (2021)    Anion gap 17 (H) 5 - 15    Comment: Performed at [facility], [address], [place], [place] [address]  Troponin I (High Sensitivity)     Status: Abnormal   Collection Time: 07/30/23  7:13 PM  Result Value Ref Range   Troponin I (High Sensitivity) 83 (H) <18 ng/L    Comment: (NOTE) Elevated high sensitivity troponin I (hsTnI) values and significant  changes across serial measurements may suggest ACS but  many other  chronic and acute conditions are known to elevate hsTnI results.  Refer to the Links section for chest pain algorithms and additional  guidance. Performed at [facility], [address], [place], [place] [address]   Magnesium     Status: None   Collection Time: 07/30/23  7:13  PM  Result Value Ref Range   Magnesium 2.1 1.7 - 2.4 mg/dL    Comment: Performed at [facility], [address] [place], [place] [address]  Ammonia     Status: None   Collection Time: 07/30/23  7:13 PM  Result Value Ref Range   Ammonia 26 9 - 35 umol/L    Comment: HEMOLYSIS AT THIS LEVEL MAY AFFECT RESULT Performed at [facility], [address], [place], [place] [address]   TSH     Status: Abnormal   Collection Time: 07/30/23  7:13 PM  Result Value Ref Range   TSH 6.559 (H) 0.350 - 4.500 uIU/mL    Comment: Performed by a 3rd Generation assay with a functional sensitivity of <=0.01 uIU/mL. Performed at [facility], [address] [place], [place] [address]   Urinalysis, w/ Reflex to Culture (Infection Suspected) -Urine, Clean Catch     Status: Abnormal   Collection Time: 07/30/23  7:52 PM  Result Value Ref Range   Specimen Source URINE, CLEAN CATCH    Color, Urine AMBER (A) YELLOW    Comment: BIOCHEMICALS MAY BE AFFECTED BY COLOR   APPearance HAZY (A) CLEAR   Specific Gravity, Urine 1.027 1.005 - 1.030   pH 5.0 5.0 - 8.0   Glucose, UA NEGATIVE NEGATIVE mg/dL   Hgb urine dipstick NEGATIVE NEGATIVE   Bilirubin Urine SMALL (A) NEGATIVE   Ketones, ur 5 (A) NEGATIVE mg/dL   Protein, ur 899 (A) NEGATIVE mg/dL   Nitrite NEGATIVE NEGATIVE   Leukocytes,Ua NEGATIVE NEGATIVE   RBC / HPF 0-5 0 - 5 RBC/hpf   WBC, UA 6-10 0 - 5 WBC/hpf    Comment:        Reflex urine culture not performed if WBC <=10, OR if Squamous epithelial cells >5. If Squamous epithelial cells >5 suggest recollection.    Bacteria, UA RARE (A) NONE SEEN   Squamous Epithelial / HPF 0-5 0 - 5 /HPF    Mucus PRESENT    Hyaline Casts, UA PRESENT     Comment: Performed at [facility] Lab, [address], [place], [place] [address]  I-Stat venous blood gas, ED     Status: Abnormal   Collection Time: 07/30/23  8:03 PM  Result Value Ref Range   pH, Ven 7.398 7.25 - 7.43   pCO2, Ven 40.6 (L) 44 - 60 mmHg   pO2, Ven 36 32 - 45 mmHg   Bicarbonate 25.0 20.0 - 28.0 mmol/L   TCO2 26 22 - 32 mmol/L   O2 Saturation 69 %   Acid-Base Excess 0.0 0.0 - 2.0 mmol/L   Sodium 136 135 - 145 mmol/L   Potassium 6.2 (H) 3.5 - 5.1 mmol/L   Calcium , Ion 1.01 (L) 1.15 - 1.40 mmol/L   HCT 44.0 39.0 - 52.0 %   Hemoglobin 15.0 13.0 - 17.0 g/dL   Sample type VENOUS    Comment NOTIFIED PHYSICIAN   Troponin I (High Sensitivity)     Status: Abnormal   Collection Time: 07/30/23  9:35 PM  Result Value Ref Range   Troponin I (High Sensitivity) 79 (H) <18 ng/L    Comment: (NOTE) Elevated high sensitivity troponin I (hsTnI) values and significant  changes across serial measurements may suggest ACS but many other  chronic and acute conditions are known to elevate hsTnI results.  Refer to the Links section for chest pain algorithms and additional  guidance. Performed at [facility] Lab, [address] [place], [place] [address]   T4, free     Status: Abnormal   Collection Time: 07/30/23  9:35 PM  Result Value Ref Range   Free T4 1.23 (H) 0.61 - 1.12 ng/dL    Comment: (NOTE) Biotin ingestion may interfere with free T4 tests. If the results are inconsistent with the TSH level, previous test results, or the clinical presentation, then consider biotin interference. If needed, order repeat testing after stopping biotin. Performed at [facility] Lab, [address], [place], [place] [address]   CK     Status: Abnormal   Collection Time: 07/31/23 12:30 AM  Result Value Ref Range   Total CK 1,730 (H) 49 - 397 U/L    Comment: HEMOLYSIS AT THIS LEVEL MAY AFFECT RESULT Performed at [facility]  Lab, [address], [place], [place] [address]   Troponin I (High Sensitivity)     Status: Abnormal   Collection Time: 07/31/23  4:02 AM  Result Value Ref Range   Troponin I (High Sensitivity) 89 (H) <18 ng/L    Comment: (NOTE) Elevated high sensitivity troponin I (hsTnI) values and significant  changes across serial measurements may suggest ACS but many other  chronic and acute conditions are known to elevate hsTnI results.  Refer to the Links section for chest pain algorithms and additional  guidance. Performed at [facility] Lab, [address] [place], [place] [address]   Comprehensive metabolic panel     Status: Abnormal   Collection Time: 07/31/23  4:02 AM  Result Value Ref Range   Sodium 135 135 - 145 mmol/L   Potassium 4.7 3.5 - 5.1 mmol/L   Chloride 104 98 - 111 mmol/L   CO2 17 (L) 22 - 32 mmol/L   Glucose, Bld 83 70 - 99 mg/dL    Comment: Glucose reference range applies only to samples taken after fasting for at least 8 hours.   BUN 18 8 - 23 mg/dL   Creatinine, Ser 9.08 0.61 - 1.24 mg/dL   Calcium  8.4 (L) 8.9 - 10.3 mg/dL   Total Protein 6.8 6.5 - 8.1 g/dL   Albumin 2.7 (L) 3.5 - 5.0 g/dL   AST 81 (H) 15 - 41 U/L   ALT 18 0 - 44 U/L   Alkaline Phosphatase 45 38 - 126 U/L   Total Bilirubin 2.5 (H) 0.0 - 1.2 mg/dL   GFR, Estimated >39 >39 mL/min    Comment: (NOTE) Calculated using the CKD-EPI Creatinine Equation (2021)    Anion gap 14 5 - 15    Comment: Performed at [facility] Lab, [address], [place], [place] [address]  CBC     Status: Abnormal   Collection Time: 07/31/23  4:02 AM  Result Value Ref Range   WBC 9.0 4.0 - 10.5 K/uL   RBC 4.35 4.22 - 5.81 MIL/uL   Hemoglobin 14.7 13.0 - 17.0 g/dL   HCT 56.1 60.9 - 47.9 %   MCV 100.7 (H) 80.0 - 100.0 fL   MCH 33.8 26.0 - 34.0 pg   MCHC 33.6 30.0 - 36.0 g/dL   RDW 85.8 88.4 - 84.4 %   Platelets 132 (L) 150 - 400 K/uL    Comment: REPEATED TO VERIFY   nRBC 0.0 0.0 - 0.2 %    Comment: Performed at  [facility] Lab, [address] [place], [place] [address]  Troponin I (High Sensitivity)     Status: Abnormal   Collection Time:  07/31/23  4:50 AM  Result Value Ref Range   Troponin I (High Sensitivity) 106 (HH) <18 ng/L    Comment: CRITICAL RESULT CALLED TO, READ BACK BY AND VERIFIED WITH C. CARICO RN 07/31/23 @0541  BY J. WHITE (NOTE) Elevated high sensitivity troponin I (hsTnI) values and significant  changes across serial measurements may suggest ACS but many other  chronic and acute conditions are known to elevate hsTnI results.  Refer to the Links section for chest pain algorithms and additional  guidance. Performed at [facility], [address] [place], [place] [address]   TSH     Status: Abnormal   Collection Time: 07/31/23  4:50 AM  Result Value Ref Range   TSH 5.442 (H) 0.350 - 4.500 uIU/mL    Comment: Performed by a 3rd Generation assay with a functional sensitivity of <=0.01 uIU/mL. Performed at [facility], [address] [place], [place] [address]    CT Angio Chest PE W and/or Wo Contrast Result Date: 07/31/2023 CLINICAL DATA:  Patient found down by family. Altered mental status since 12/31. Unsteady on feet. Blunt abdominal trauma. Abdominal pain. EXAM: CT ANGIOGRAPHY CHEST CT ABDOMEN AND PELVIS WITH CONTRAST TECHNIQUE: Multidetector CT imaging of the chest was performed using the standard protocol during bolus administration of intravenous contrast. Multiplanar CT image reconstructions and MIPs were obtained to evaluate the vascular anatomy. Multidetector CT imaging of the abdomen and pelvis was performed using the standard protocol during bolus administration of intravenous contrast. RADIATION DOSE REDUCTION: This exam was performed according to the departmental dose-optimization program which includes automated exposure control, adjustment of the mA and/or kV according to patient size and/or use of iterative reconstruction technique. CONTRAST:  75mL  OMNIPAQUE  IOHEXOL  350 MG/ML SOLN COMPARISON:  Ultrasound 07/30/2023; chest radiograph 07/30/2023; CTA chest 11/08/2021; CT abdomen and pelvis 09/21/2015 FINDINGS: CTA CHEST FINDINGS Cardiovascular: Cardiomegaly. No pericardial effusion. Reflux of contrast into the IVC and hepatic veins compatible with elevated right heart pressures. Negative for acute pulmonary embolism. Coronary artery and aortic atherosclerotic calcification. Mediastinum/Nodes: Trachea and esophagus are unremarkable. No thoracic adenopathy. Lungs/Pleura: Patchy bilateral ground-glass opacities with interlobular septal thickening. Diffuse bronchial wall thickening. Trace bilateral pleural effusions. No pneumothorax. Musculoskeletal: No acute fracture. Review of the MIP images confirms the above findings. CT ABDOMEN and PELVIS FINDINGS Hepatobiliary: Respiratory motion limits assessment of the upper abdomen. Questionable nodular hepatic contour which can be seen with cirrhosis. No definite gallbladder abnormality though motion limits assessment of the gallbladder wall. No definite biliary dilation. Pancreas: Unremarkable. Spleen: Unremarkable. Adrenals/Urinary Tract: Stable adrenal glands. Cortical geographic hypoattenuation in the kidneys could be due to chronic scarring or pyelonephritis. No urinary calculi or hydronephrosis. Bladder wall thickening with perivesical stranding. Stomach/Bowel: Normal caliber large and small bowel. Stool ball in the rectum. Stomach and appendix are within normal limits. Vascular/Lymphatic: Aortic atherosclerosis. No enlarged abdominal or pelvic lymph nodes. Reproductive: Unremarkable. Other: Mesenteric and body wall edema. Small volume abdominopelvic ascites. No free intraperitoneal air. Musculoskeletal: No acute fracture. Review of the MIP images confirms the above findings. IMPRESSION: 1. Negative for acute pulmonary embolism. 2. Cardiomegaly with elevated right heart pressures. 3. Ground-glass opacities and  interlobular septal thickening with trace bilateral pleural effusions. Prominent differential considerations are pulmonary edema and atypical pneumonia. 4. Anasarca with small volume abdominopelvic ascites. 5. Bladder wall thickening with perivesical stranding. Correlate with urinalysis to exclude cystitis. 6. Cortical geographic hypoattenuation in the kidneys could be due to chronic scarring or pyelonephritis. 7. Questionable nodular hepatic contour  which can be seen with cirrhosis. Aortic Atherosclerosis (ICD10-I70.0). Electronically Signed   By: Norman Gatlin M.D.   On: 07/31/2023 02:04   CT ABDOMEN PELVIS W CONTRAST Result Date: 07/31/2023 CLINICAL DATA:  Patient found down by family. Altered mental status since 12/31. Unsteady on feet. Blunt abdominal trauma. Abdominal pain. EXAM: CT ANGIOGRAPHY CHEST CT ABDOMEN AND PELVIS WITH CONTRAST TECHNIQUE: Multidetector CT imaging of the chest was performed using the standard protocol during bolus administration of intravenous contrast. Multiplanar CT image reconstructions and MIPs were obtained to evaluate the vascular anatomy. Multidetector CT imaging of the abdomen and pelvis was performed using the standard protocol during bolus administration of intravenous contrast. RADIATION DOSE REDUCTION: This exam was performed according to the departmental dose-optimization program which includes automated exposure control, adjustment of the mA and/or kV according to patient size and/or use of iterative reconstruction technique. CONTRAST:  75mL OMNIPAQUE  IOHEXOL  350 MG/ML SOLN COMPARISON:  Ultrasound 07/30/2023; chest radiograph 07/30/2023; CTA chest 11/08/2021; CT abdomen and pelvis 09/21/2015 FINDINGS: CTA CHEST FINDINGS Cardiovascular: Cardiomegaly. No pericardial effusion. Reflux of contrast into the IVC and hepatic veins compatible with elevated right heart pressures. Negative for acute pulmonary embolism. Coronary artery and aortic atherosclerotic calcification.  Mediastinum/Nodes: Trachea and esophagus are unremarkable. No thoracic adenopathy. Lungs/Pleura: Patchy bilateral ground-glass opacities with interlobular septal thickening. Diffuse bronchial wall thickening. Trace bilateral pleural effusions. No pneumothorax. Musculoskeletal: No acute fracture. Review of the MIP images confirms the above findings. CT ABDOMEN and PELVIS FINDINGS Hepatobiliary: Respiratory motion limits assessment of the upper abdomen. Questionable nodular hepatic contour which can be seen with cirrhosis. No definite gallbladder abnormality though motion limits assessment of the gallbladder wall. No definite biliary dilation. Pancreas: Unremarkable. Spleen: Unremarkable. Adrenals/Urinary Tract: Stable adrenal glands. Cortical geographic hypoattenuation in the kidneys could be due to chronic scarring or pyelonephritis. No urinary calculi or hydronephrosis. Bladder wall thickening with perivesical stranding. Stomach/Bowel: Normal caliber large and small bowel. Stool ball in the rectum. Stomach and appendix are within normal limits. Vascular/Lymphatic: Aortic atherosclerosis. No enlarged abdominal or pelvic lymph nodes. Reproductive: Unremarkable. Other: Mesenteric and body wall edema. Small volume abdominopelvic ascites. No free intraperitoneal air. Musculoskeletal: No acute fracture. Review of the MIP images confirms the above findings. IMPRESSION: 1. Negative for acute pulmonary embolism. 2. Cardiomegaly with elevated right heart pressures. 3. Ground-glass opacities and interlobular septal thickening with trace bilateral pleural effusions. Prominent differential considerations are pulmonary edema and atypical pneumonia. 4. Anasarca with small volume abdominopelvic ascites. 5. Bladder wall thickening with perivesical stranding. Correlate with urinalysis to exclude cystitis. 6. Cortical geographic hypoattenuation in the kidneys could be due to chronic scarring or pyelonephritis. 7. Questionable nodular  hepatic contour which can be seen with cirrhosis. Aortic Atherosclerosis (ICD10-I70.0). Electronically Signed   By: Norman Gatlin M.D.   On: 07/31/2023 02:04   US  Abdomen Limited RUQ (LIVER/GB) Result Date: 07/30/2023 CLINICAL DATA:  Transaminitis EXAM: ULTRASOUND ABDOMEN LIMITED RIGHT UPPER QUADRANT COMPARISON:  CT 09/21/2015 FINDINGS: Gallbladder: Sludge filled gallbladder. Increased wall thickness at 4.9 mm. Negative sonographic Murphy. Trace pericholecystic fluid Common bile duct: Diameter: 4.7 mm Liver: No focal hepatic abnormality. Echogenicity within normal limits. Trace perihepatic ascites. Questionable surface nodularity of the liver portal vein is patent on color Doppler imaging with normal direction of blood flow towards the liver. Other: Trace perihepatic ascites IMPRESSION: 1. Sludge filled gallbladder with increased wall thickness and trace pericholecystic fluid but negative sonographic Murphy. If acute gallbladder disease is a concern, correlation with nuclear medicine hepatobiliary imaging could be  obtained. 2. Trace perihepatic ascites. Questionable surface nodularity of the liver, correlate for cirrhosis risk factors. Electronically Signed   By: Luke Bun M.D.   On: 07/30/2023 22:31   CT Head Wo Contrast Result Date: 07/30/2023 CLINICAL DATA:  Mental status change EXAM: CT HEAD WITHOUT CONTRAST TECHNIQUE: Contiguous axial images were obtained from the base of the skull through the vertex without intravenous contrast. RADIATION DOSE REDUCTION: This exam was performed according to the departmental dose-optimization program which includes automated exposure control, adjustment of the mA and/or kV according to patient size and/or use of iterative reconstruction technique. COMPARISON:  CT brain 06/18/2022 trauma MRI 08/26/2021 FINDINGS: Brain: Motion degradation limits the exam. No gross hemorrhage or intracranial mass. Atrophy and chronic small vessel ischemic changes of the white matter.  Chronic right parietal infarct. Interval right posterior frontal infarct, series 3, image 25, suspect chronic but new from prior head CT. Interval hypodensity at the right posterior temporal and parietal lobes consistent with infarct, probably chronic but limited by motion degradation and more extensive when compared to the CT from November of 2023. Ventricles are nonenlarged. Vascular: No hyperdense vessels.  Carotid vascular calcification Skull: Normal. Negative for fracture or focal lesion. Sinuses/Orbits: Moderate mucosal thickening within the paranasal sinuses. Other: None IMPRESSION: 1. Significantly limited by motion degradation. No obvious hemorrhage, mass or mass effect. 2. Atrophy and chronic small vessel ischemic changes of the white matter 3. Prior chronic right parietal infarct. Interval right posterior frontal and right temporoparietal areas of suspected infarction, suspect chronic but new as compared with head CT from 2023 and assessment is limited by significant motion degradation. Electronically Signed   By: Luke Bun M.D.   On: 07/30/2023 20:59   DG Chest Port 1 View Result Date: 07/30/2023 CLINICAL DATA:  Altered mental status EXAM: PORTABLE CHEST 1 VIEW COMPARISON:  Chest x-ray 11/13/2022 FINDINGS: The heart is enlarged, unchanged. There is no pleural effusion or pneumothorax. Both lungs are clear. The visualized skeletal structures are unremarkable. IMPRESSION: 1. No active disease. 2. Cardiomegaly. Electronically Signed   By: Greig Pique M.D.   On: 07/30/2023 19:29    Pending Labs Unresulted Labs (From admission, onward)     Start     Ordered   07/31/23 0804  CK  Add-on,   AD        07/31/23 0803   07/31/23 0557  Culture, blood (Routine X 2) w Reflex to ID Panel  BLOOD CULTURE X 2,   R (with TIMED occurrences)      07/31/23 0556   07/30/23 1857  Brain natriuretic peptide  Once,   URGENT        07/30/23 1856            Vitals/Pain Today's Vitals   07/31/23 0445  07/31/23 0645 07/31/23 1115 07/31/23 1129  BP: 102/76 97/69 101/73 101/73  Pulse: 66 80 70 81  Resp: 16 16 17 15   Temp: 98.5 F (36.9 C)   (!) 97.5 F (36.4 C)  TempSrc: Axillary   Oral  SpO2: 100% 99%  98%  Weight:      Height:      PainSc:        Isolation Precautions No active isolations  Medications Medications  carvedilol  (COREG ) tablet 3.125 mg (3.125 mg Oral Not Given 07/31/23 0750)  cefTRIAXone  (ROCEPHIN ) 2 g in sodium chloride  0.9 % 100 mL IVPB (0 g Intravenous Stopped 07/31/23 0828)  metroNIDAZOLE  (FLAGYL ) IVPB 500 mg (0 mg Intravenous Stopped 07/31/23 1229)  sodium chloride  flush (NS) 0.9 % injection 3 mL (3 mLs Intravenous Given 07/31/23 1100)  sodium chloride  flush (NS) 0.9 % injection 3 mL (3 mLs Intravenous Given 07/31/23 1100)  sodium chloride  flush (NS) 0.9 % injection 3 mL (has no administration in time range)  0.9 %  sodium chloride  infusion (has no administration in time range)  acetaminophen  (TYLENOL ) tablet 650 mg (has no administration in time range)    Or  acetaminophen  (TYLENOL ) suppository 650 mg (has no administration in time range)  senna-docusate (Senokot-S) tablet 1 tablet (has no administration in time range)  ondansetron  (ZOFRAN ) tablet 4 mg (has no administration in time range)    Or  ondansetron  (ZOFRAN ) injection 4 mg (has no administration in time range)  apixaban  (ELIQUIS ) tablet 5 mg (5 mg Oral Given 07/31/23 1108)  cefTRIAXone  (ROCEPHIN ) 1 g in sodium chloride  0.9 % 100 mL IVPB (0 g Intravenous Stopped 07/31/23 0021)  metroNIDAZOLE  (FLAGYL ) IVPB 500 mg (0 mg Intravenous Stopped 07/31/23 0144)  iohexol  (OMNIPAQUE ) 350 MG/ML injection 75 mL (75 mLs Intravenous Contrast Given 07/31/23 0130)  azithromycin  (ZITHROMAX ) 500 mg in sodium chloride  0.9 % 250 mL IVPB (0 mg Intravenous Stopped 07/31/23 0605)  technetium TC 99M  mebrofenin  (CHOLETEC ) injection 7.8 millicurie (7.8 millicuries Intravenous Contrast Given 07/31/23 0820)  morphine  (PF) 4 MG/ML injection 2.5 mg  (2.5 mg Intravenous Given 07/31/23 0950)    Mobility walks with device     Focused Assessments musculoskeletal   R Recommendations: See Admitting Provider Note  Report given to:   Additional Notes:

## 2023-07-31 NOTE — Consult Note (Addendum)
 Cardiology Consultation   Patient ID: Lucas Fuller MRN: [id number]; DOB: 07/01/1941  Admit date: 07/30/2023 Date of Consult: 07/31/2023  PCP:  Ransom Other, MD   [facility] Providers Cardiologist:  Danelle Birmingham, MD     Patient Profile:   Lucas Fuller is a 83 y.o. male with a hx of atrial fibrillation felt to be persistent, history of nonischemic cardiomyopathy,, HFrEF, bladder cancer, history of CVA, HTN, hypothyroidism, history of GI bleed, history of alcohol  abuse, who is being seen 07/31/2023 for the evaluation of atrial fibrillation and elevated troponins at the request of Dr. Sundil.   History of Present Illness:   Lucas Fuller is an 83 year old male with above medical history. Patient was admitted in 11/2015 for evaluation of dyspnea and lower extremity edema.  Echocardiogram on 12/05/15 showed EF 20-25% with diffuse hypokinesis, possibly bicuspid aortic valve with mild stenosis, mild MR. He underwent nuclear stress test on 12/07/2015 that was a high risk study.  Underwent cath on 12/08/2015 that showed clean coronary arteries and a dilated severely hypocontractile LV, consistent with nonischemic cardiomyopathy.  During that admission, patient was also found to have atrial flutter.  At that time, patient was a poor candidate for anticoagulation given history of GI bleed, ongoing alcohol  abuse.  Was treated with a rate control strategy.  Later underwent atrial flutter ablation on 05/17/16.  Later, echocardiogram in 01/2017 showed EF 40-45%, mild diffuse hypokinesis, grade 1 diastolic dysfunction, mild MR.  Patient later hospitalized in 07/2019 with COVID-pneumonia.  Found to be in rate controlled atrial fibrillation that was felt to be secondary to acute illness.  Started on IV heparin .  However, family declined long-term anticoagulation mentioning patient's history of falls.  He was later seen in the cardiology clinic on 12/18/2019.  At that time, patient complained of bilateral  lower extremity swelling and shortness of breath.  Patient decided to remain off anticoagulation.  He was instructed to increase his Lasix  dose.  Underwent echocardiogram on 01/07/2020 that showed EF 30-35%, grade 2 diastolic dysfunction, mildly reduced RV function, normal pulmonary artery systolic pressure, mild aortic valve stenosis.  Patient also wore a Zio patch in 12/2019 that showed normal sinus rhythm with occasional PACs, rare PVCs.  No atrial fibrillation/flutter noted.   Patient was later admitted in 08/2021 with embolic stroke.  Patient had been on aspirin , was started on Eliquis  during that admission.  Echocardiogram 08/27/2021 showed EF 30-35%, global hypokinesis, mild LVH, grade 2 diastolic dysfunction, elevated LVEDP, normal RV function, mild MR, mild-moderate aortic valve stenosis.  Most recent echocardiogram from 11/09/2021 showed EF 30-35% with global hypokinesis, moderate LVH, moderately reduced RV systolic function, mild mitral valve regurg, moderate tricuspid valve regurgitation, mild/moderate aortic valve stenosis.  Patient presented to the ED on 07/30/23 after he was found on the floor. Had reportedly not been acting right since 12/31.  In the ED, labs showed K4.7, creatinine 0.92, mag 2.1, albumin 3.1, WBC 9.7, hemoglobin 14.9.  Total CK elevated to 1730.  High-sensitivity troponin 83, 79, 89, 106.  Chest x-ray showed no active disease, cardiomegaly.  CT head showed no obvious hemorrhage or masses.  CTA chest showed no evidence of PE, cardiomegaly, groundglass opacities and interlobar septal thickening with trace bilateral pleural effusions, likely representing pulmonary edema versus atypical pneumonia. RUQ ultrasound showed gallbladder filled with sludge with increased thickness and trace pericholecystic fluid with negative sonographic murphy. Admitted to the [facility] service for acalculous cholecystitis.   EKG this AM showed atrial fibrillation  with HR 142 BPM.  Currently, patient remains in  atrial fibrillation with HR in the 60s-70s   Past Medical History:  Diagnosis Date   Acute diastolic congestive heart failure (HCC)    Acute systolic CHF (congestive heart failure) (HCC)    Atrial fibrillation, persistent (HCC)    Atrial flutter (HCC) 12/05/2015   Bladder cancer (HCC)    Cardiomyopathy (HCC)    CHF (congestive heart failure) (HCC) 12/05/2015   Chronic systolic CHF (congestive heart failure) (HCC)    Edema 12/05/2015   Elevated brain natriuretic peptide (BNP) level    Essential hypertension 06/24/2018   ETOH abuse 12/05/2015   History of blood transfusion    Hypothyroidism    Lumbar disc disease 12/05/2015   Malnutrition of moderate degree 12/07/2015   Migraine    years ago (05/17/2016)   Non-ischemic cardiomyopathy (HCC)    Paroxysmal atrial fibrillation (HCC)    Pneumonia due to COVID-19 virus 08/18/2019   Protein calorie malnutrition (HCC) 12/05/2015   SOB (shortness of breath) 12/05/2015   Thrombocytopenia (HCC) 12/05/2015   Thyroid  disease    Weight loss 12/05/2015    Past Surgical History:  Procedure Laterality Date   CARDIAC CATHETERIZATION N/A 12/08/2015   Procedure: Left Heart Cath and Coronary Angiography;  Surgeon: Dorn JINNY Lesches, MD;  Location: [facility] INVAS[facility] CV LAB;  Service: Cardiovascular;  Laterality: N/A;   CYSTOSCOPY WITH FULGERATION  2008   thelbert 11/23/2010   ELECTROPHYSIOLOGIC STUDY N/A 05/17/2016   Procedure: A-Flutter Ablation;  Surgeon: Danelle LELON Birmingham, MD;  Location: [facility] INVASIVE CV LAB;  Service: Cardiovascular;  Laterality: N/A;   TONSILLECTOMY     TRANSURETHRAL RESECTION OF BLADDER  2008   thelbert 11/23/2010     Inpatient Medications: Scheduled Meds:  apixaban   5 mg Oral BID   carvedilol   3.125 mg Oral BID WC   sodium chloride  flush  3 mL Intravenous Q12H   sodium chloride  flush  3 mL Intravenous Q12H   Continuous Infusions:  sodium chloride      cefTRIAXone  (ROCEPHIN )  IV Stopped (07/31/23 9171)   metronidazole  Stopped (07/31/23 1229)    PRN Meds: sodium chloride , acetaminophen  **OR** acetaminophen , ondansetron  **OR** ondansetron  (ZOFRAN ) IV, senna-docusate, sodium chloride  flush  Allergies:    Allergies  Allergen Reactions   Asa [Aspirin ] Hives    Pt can tolerate 81 mg with no issues    Social History:   Social History   Socioeconomic History   Marital status: Divorced    Spouse name: Not on file   Number of children: 1   Years of education: Not on file   Highest education level: 10th grade  Occupational History   Occupation: Retired    Comment: gets [employer] every month  Tobacco Use   Smoking status: Never   Smokeless tobacco: Never  Vaping Use   Vaping status: Never Used  Substance and Sexual Activity   Alcohol  use: Not Currently    Alcohol /week: 6.0 standard drinks of alcohol     Types: 6 Cans of beer per week    Comment: 05/17/2016 stopped drinking ~ 09/2015   Drug use: Not on file   Sexual activity: Not Currently  Other Topics Concern   Not on file  Social History Narrative   Not on file   Social Drivers of Health   Financial Resource Strain: Not on file  Food Insecurity: No Food Insecurity (11/10/2021)   Hunger Vital Sign    Worried About Running Out of Food in the Last Year: Never true  Ran Out of Food in the Last Year: Never true  Transportation Needs: No Transportation Needs (11/10/2021)   PRAPARE - Administrator, Civil Service (Medical): No    Lack of Transportation (Non-Medical): No  Physical Activity: Not on file  Stress: Not on file  Social Connections: Not on file  Intimate Partner Violence: Not on file    Family History:   Family History  Problem Relation Age of Onset   Heart attack Father 76   Kidney disease Sister    Hypertension Sister    Lung cancer Sister    CVA Sister    Diabetes Sister      ROS:  Please see the history of present illness.   All other ROS reviewed and negative.     Physical Exam/Data:   Vitals:   07/31/23 1130 07/31/23  1145 07/31/23 1215 07/31/23 1230  BP: 105/80 98/70 99/69  103/73  Pulse: (!) 42 77  75  Resp: 14 15  14   Temp:      TempSrc:      SpO2: 97% 96%  98%  Weight:      Height:        Intake/Output Summary (Last 24 hours) at 07/31/2023 1326 Last data filed at 07/31/2023 1229 Gross per 24 hour  Intake 1006.53 ml  Output --  Net 1006.53 ml      07/30/2023    6:19 PM 01/03/2022    3:36 PM 11/21/2021   11:08 AM  Last 3 Weights  Weight (lbs) 135 lb 163 lb 165 lb  Weight (kg) 61.236 kg 73.936 kg 74.844 kg     Body mass index is 21.14 kg/m.  General:  Frail, elderly male. Laying in the bed with head slightly elevated. Asleep, wakes up to physical touch  HEENT: normal Neck: no JVD Cardiac:  normal S1, S2; irregular rate and rhythm, no murmur  Lungs:  anterior lung exam clear. Breathing unlabored on RA  Abd: soft, not distended  Ext: no edema in BLE Musculoskeletal:  No deformities, BUE and BLE strength normal and equal Skin: warm and dry  Neuro:  CNs 2-12 intact, no focal abnormalities noted Psych:  Normal affect   EKG:  The EKG was personally reviewed and demonstrates:  Atrial fibrillation with HR 142 BPM, RBBB present  Telemetry:  Telemetry was personally reviewed and demonstrates:  Atrial fibrillation, HR in the 60s-70s   Relevant CV Studies:  Echocardiogram 11/09/21  1. Left ventricular ejection fraction, by estimation, is 30 to 35%. The  left ventricle has moderately decreased function. The left ventricle  demonstrates global hypokinesis. There is moderate concentric left  ventricular hypertrophy. Left ventricular  diastolic parameters are indeterminate.   2. Right ventricular systolic function is moderately reduced. The right  ventricular size is normal. There is mildly elevated pulmonary artery  systolic pressure. The estimated right ventricular systolic pressure is  39.2 mmHg.   3. Left atrial size was mildly dilated.   4. The mitral valve is degenerative. Mild mitral valve  regurgitation. No  evidence of mitral stenosis.   5. Tricuspid valve regurgitation is moderate. There are multiple jets.   6. The aortic valve is calcified. There is moderate calcification of the  aortic valve. There is moderate thickening of the aortic valve. Aortic  valve regurgitation is not visualized. Mild to moderate aortic valve  stenosis.   7. The inferior vena cava is dilated in size with <50% respiratory  variability, suggesting right atrial pressure of 15 mmHg.  Comparison(s): RV function and Trcisupid regurgitation worse from prior  study (08/27/21).    Laboratory Data:  High Sensitivity Troponin:   Recent Labs  Lab 07/30/23 1913 07/30/23 2135 07/31/23 0402 07/31/23 0450  TROPONINIHS 83* 79* 89* 106*     Chemistry Recent Labs  Lab 07/30/23 1913 07/30/23 2003 07/31/23 0402  NA 137 136 135  K 4.7 6.2* 4.7  CL 102  --  104  CO2 18*  --  17*  GLUCOSE 93  --  83  BUN 19  --  18  CREATININE 0.92  --  0.91  CALCIUM  8.9  --  8.4*  MG 2.1  --   --   GFRNONAA >60  --  >60  ANIONGAP 17*  --  14    Recent Labs  Lab 07/30/23 1913 07/31/23 0402  PROT 7.5 6.8  ALBUMIN 3.1* 2.7*  AST 84* 81*  ALT 21 18  ALKPHOS 50 45  BILITOT 2.9* 2.5*   Lipids No results for input(s): CHOL, TRIG, HDL, LABVLDL, LDLCALC, CHOLHDL in the last 168 hours.  Hematology Recent Labs  Lab 07/30/23 1913 07/30/23 2003 07/31/23 0402  WBC 9.7  --  9.0  RBC 4.24  --  4.35  HGB 14.9 15.0 14.7  HCT 41.9 44.0 43.8  MCV 98.8  --  100.7*  MCH 35.1*  --  33.8  MCHC 35.6  --  33.6  RDW 14.4  --  14.1  PLT 199  --  132*   Thyroid   Recent Labs  Lab 07/30/23 2135 07/31/23 0450  TSH  --  5.442*  FREET4 1.23*  --     BNPNo results for input(s): BNP, PROBNP in the last 168 hours.  DDimer No results for input(s): DDIMER in the last 168 hours.   Radiology/Studies:  CT Angio Chest PE W and/or Wo Contrast Result Date: 07/31/2023 CLINICAL DATA:  Patient found down by  family. Altered mental status since 12/31. Unsteady on feet. Blunt abdominal trauma. Abdominal pain. EXAM: CT ANGIOGRAPHY CHEST CT ABDOMEN AND PELVIS WITH CONTRAST TECHNIQUE: Multidetector CT imaging of the chest was performed using the standard protocol during bolus administration of intravenous contrast. Multiplanar CT image reconstructions and MIPs were obtained to evaluate the vascular anatomy. Multidetector CT imaging of the abdomen and pelvis was performed using the standard protocol during bolus administration of intravenous contrast. RADIATION DOSE REDUCTION: This exam was performed according to the departmental dose-optimization program which includes automated exposure control, adjustment of the mA and/or kV according to patient size and/or use of iterative reconstruction technique. CONTRAST:  75mL OMNIPAQUE  IOHEXOL  350 MG/ML SOLN COMPARISON:  Ultrasound 07/30/2023; chest radiograph 07/30/2023; CTA chest 11/08/2021; CT abdomen and pelvis 09/21/2015 FINDINGS: CTA CHEST FINDINGS Cardiovascular: Cardiomegaly. No pericardial effusion. Reflux of contrast into the IVC and hepatic veins compatible with elevated right heart pressures. Negative for acute pulmonary embolism. Coronary artery and aortic atherosclerotic calcification. Mediastinum/Nodes: Trachea and esophagus are unremarkable. No thoracic adenopathy. Lungs/Pleura: Patchy bilateral ground-glass opacities with interlobular septal thickening. Diffuse bronchial wall thickening. Trace bilateral pleural effusions. No pneumothorax. Musculoskeletal: No acute fracture. Review of the MIP images confirms the above findings. CT ABDOMEN and PELVIS FINDINGS Hepatobiliary: Respiratory motion limits assessment of the upper abdomen. Questionable nodular hepatic contour which can be seen with cirrhosis. No definite gallbladder abnormality though motion limits assessment of the gallbladder wall. No definite biliary dilation. Pancreas: Unremarkable. Spleen: Unremarkable.  Adrenals/Urinary Tract: Stable adrenal glands. Cortical geographic hypoattenuation in the kidneys could be due to chronic scarring or  pyelonephritis. No urinary calculi or hydronephrosis. Bladder wall thickening with perivesical stranding. Stomach/Bowel: Normal caliber large and small bowel. Stool ball in the rectum. Stomach and appendix are within normal limits. Vascular/Lymphatic: Aortic atherosclerosis. No enlarged abdominal or pelvic lymph nodes. Reproductive: Unremarkable. Other: Mesenteric and body wall edema. Small volume abdominopelvic ascites. No free intraperitoneal air. Musculoskeletal: No acute fracture. Review of the MIP images confirms the above findings. IMPRESSION: 1. Negative for acute pulmonary embolism. 2. Cardiomegaly with elevated right heart pressures. 3. Ground-glass opacities and interlobular septal thickening with trace bilateral pleural effusions. Prominent differential considerations are pulmonary edema and atypical pneumonia. 4. Anasarca with small volume abdominopelvic ascites. 5. Bladder wall thickening with perivesical stranding. Correlate with urinalysis to exclude cystitis. 6. Cortical geographic hypoattenuation in the kidneys could be due to chronic scarring or pyelonephritis. 7. Questionable nodular hepatic contour which can be seen with cirrhosis. Aortic Atherosclerosis (ICD10-I70.0). Electronically Signed   By: Norman Gatlin M.D.   On: 07/31/2023 02:04   CT ABDOMEN PELVIS W CONTRAST Result Date: 07/31/2023 CLINICAL DATA:  Patient found down by family. Altered mental status since 12/31. Unsteady on feet. Blunt abdominal trauma. Abdominal pain. EXAM: CT ANGIOGRAPHY CHEST CT ABDOMEN AND PELVIS WITH CONTRAST TECHNIQUE: Multidetector CT imaging of the chest was performed using the standard protocol during bolus administration of intravenous contrast. Multiplanar CT image reconstructions and MIPs were obtained to evaluate the vascular anatomy. Multidetector CT imaging of the  abdomen and pelvis was performed using the standard protocol during bolus administration of intravenous contrast. RADIATION DOSE REDUCTION: This exam was performed according to the departmental dose-optimization program which includes automated exposure control, adjustment of the mA and/or kV according to patient size and/or use of iterative reconstruction technique. CONTRAST:  75mL OMNIPAQUE  IOHEXOL  350 MG/ML SOLN COMPARISON:  Ultrasound 07/30/2023; chest radiograph 07/30/2023; CTA chest 11/08/2021; CT abdomen and pelvis 09/21/2015 FINDINGS: CTA CHEST FINDINGS Cardiovascular: Cardiomegaly. No pericardial effusion. Reflux of contrast into the IVC and hepatic veins compatible with elevated right heart pressures. Negative for acute pulmonary embolism. Coronary artery and aortic atherosclerotic calcification. Mediastinum/Nodes: Trachea and esophagus are unremarkable. No thoracic adenopathy. Lungs/Pleura: Patchy bilateral ground-glass opacities with interlobular septal thickening. Diffuse bronchial wall thickening. Trace bilateral pleural effusions. No pneumothorax. Musculoskeletal: No acute fracture. Review of the MIP images confirms the above findings. CT ABDOMEN and PELVIS FINDINGS Hepatobiliary: Respiratory motion limits assessment of the upper abdomen. Questionable nodular hepatic contour which can be seen with cirrhosis. No definite gallbladder abnormality though motion limits assessment of the gallbladder wall. No definite biliary dilation. Pancreas: Unremarkable. Spleen: Unremarkable. Adrenals/Urinary Tract: Stable adrenal glands. Cortical geographic hypoattenuation in the kidneys could be due to chronic scarring or pyelonephritis. No urinary calculi or hydronephrosis. Bladder wall thickening with perivesical stranding. Stomach/Bowel: Normal caliber large and small bowel. Stool ball in the rectum. Stomach and appendix are within normal limits. Vascular/Lymphatic: Aortic atherosclerosis. No enlarged abdominal or  pelvic lymph nodes. Reproductive: Unremarkable. Other: Mesenteric and body wall edema. Small volume abdominopelvic ascites. No free intraperitoneal air. Musculoskeletal: No acute fracture. Review of the MIP images confirms the above findings. IMPRESSION: 1. Negative for acute pulmonary embolism. 2. Cardiomegaly with elevated right heart pressures. 3. Ground-glass opacities and interlobular septal thickening with trace bilateral pleural effusions. Prominent differential considerations are pulmonary edema and atypical pneumonia. 4. Anasarca with small volume abdominopelvic ascites. 5. Bladder wall thickening with perivesical stranding. Correlate with urinalysis to exclude cystitis. 6. Cortical geographic hypoattenuation in the kidneys could be due to chronic scarring or pyelonephritis. 7. Questionable  nodular hepatic contour which can be seen with cirrhosis. Aortic Atherosclerosis (ICD10-I70.0). Electronically Signed   By: Norman Gatlin M.D.   On: 07/31/2023 02:04   US  Abdomen Limited RUQ (LIVER/GB) Result Date: 07/30/2023 CLINICAL DATA:  Transaminitis EXAM: ULTRASOUND ABDOMEN LIMITED RIGHT UPPER QUADRANT COMPARISON:  CT 09/21/2015 FINDINGS: Gallbladder: Sludge filled gallbladder. Increased wall thickness at 4.9 mm. Negative sonographic Murphy. Trace pericholecystic fluid Common bile duct: Diameter: 4.7 mm Liver: No focal hepatic abnormality. Echogenicity within normal limits. Trace perihepatic ascites. Questionable surface nodularity of the liver portal vein is patent on color Doppler imaging with normal direction of blood flow towards the liver. Other: Trace perihepatic ascites IMPRESSION: 1. Sludge filled gallbladder with increased wall thickness and trace pericholecystic fluid but negative sonographic Murphy. If acute gallbladder disease is a concern, correlation with nuclear medicine hepatobiliary imaging could be obtained. 2. Trace perihepatic ascites. Questionable surface nodularity of the liver, correlate  for cirrhosis risk factors. Electronically Signed   By: Luke Bun M.D.   On: 07/30/2023 22:31   CT Head Wo Contrast Result Date: 07/30/2023 CLINICAL DATA:  Mental status change EXAM: CT HEAD WITHOUT CONTRAST TECHNIQUE: Contiguous axial images were obtained from the base of the skull through the vertex without intravenous contrast. RADIATION DOSE REDUCTION: This exam was performed according to the departmental dose-optimization program which includes automated exposure control, adjustment of the mA and/or kV according to patient size and/or use of iterative reconstruction technique. COMPARISON:  CT brain 06/18/2022 trauma MRI 08/26/2021 FINDINGS: Brain: Motion degradation limits the exam. No gross hemorrhage or intracranial mass. Atrophy and chronic small vessel ischemic changes of the white matter. Chronic right parietal infarct. Interval right posterior frontal infarct, series 3, image 25, suspect chronic but new from prior head CT. Interval hypodensity at the right posterior temporal and parietal lobes consistent with infarct, probably chronic but limited by motion degradation and more extensive when compared to the CT from November of 2023. Ventricles are nonenlarged. Vascular: No hyperdense vessels.  Carotid vascular calcification Skull: Normal. Negative for fracture or focal lesion. Sinuses/Orbits: Moderate mucosal thickening within the paranasal sinuses. Other: None IMPRESSION: 1. Significantly limited by motion degradation. No obvious hemorrhage, mass or mass effect. 2. Atrophy and chronic small vessel ischemic changes of the white matter 3. Prior chronic right parietal infarct. Interval right posterior frontal and right temporoparietal areas of suspected infarction, suspect chronic but new as compared with head CT from 2023 and assessment is limited by significant motion degradation. Electronically Signed   By: Luke Bun M.D.   On: 07/30/2023 20:59   DG Chest Port 1 View Result Date:  07/30/2023 CLINICAL DATA:  Altered mental status EXAM: PORTABLE CHEST 1 VIEW COMPARISON:  Chest x-ray 11/13/2022 FINDINGS: The heart is enlarged, unchanged. There is no pleural effusion or pneumothorax. Both lungs are clear. The visualized skeletal structures are unremarkable. IMPRESSION: 1. No active disease. 2. Cardiomegaly. Electronically Signed   By: Greig Pique M.D.   On: 07/30/2023 19:29     Assessment and Plan:   Persistent Atrial Fibrillation  - Patient has a history of atrial fibrillation and atrial flutter. Previously had atrial flutter ablation in 2017 - Patient currently admitted after a mechanical fall. Found to have rhabdomyolysis and acalculous cholecystitis  - Patient was in afib with RVR this AM. He was started back on his home carvedilol  3.125 mg BID. Now, remains in afib but HR is well controlled in the 60s-70s - Continue carvedilol   - Continue eliquis  5 mg BID- weight is  61 kg, creatinine 0.91  Chronic Systolic Heart Failure  Nonischemic cardiomyopathy  Mild-moderate AS, mild MR -First found to have EF of 20-25% in 2017.  Cardiac catheterization at that time showed clean coronary arteries, consistent with nonischemic cardiomyopathy -Most recent echocardiogram from 10/2021 showed EF 30-35% with global hypokinesis, moderate LVH, moderately reduced RV function, mild MR, mild-moderate AS - Echo pending this admission  - Patient appears euvolemic on exam. CTA chest showed trace bilateral pleural effusions. BNP pending. With low BP, agree with holding lasix   - Continue carvedilol   - Holding home lisinopril  with low BP  Elevated Troponins - hsTn 83>79>89>106 - CK total elevated to 1730. Patient is confused, but denies chest pain - Cath in 2017 was without CAD  - Suspect trop elevation is due to myocardial injury in the setting of rhabdo - Echo pending as above   Otherwise per primary  - Acalculous cholecystitis  - Acute metabolic encephalopathy  - Rhabdomyolysis  -  Hypothyroidism  - History of bladder cancer  - History of CVA    Risk Assessment/Risk Scores:    CHA2DS2-VASc Score = 6  This indicates a 9.7% annual risk of stroke. The patient's score is based upon: CHF History: 1 HTN History: 1 Diabetes History: 0 Stroke History: 2 Vascular Disease History: 0 Age Score: 2 Gender Score: 0   For questions or updates, please contact [facility] Please consult [email or web address] for contact info under    Signed, Rollo FABIENE Louder, PA-C  07/31/2023 1:26 PM  ADDENDUM:   Patient seen and examined with APP, Rollo Louder.  I personally taken a history, examined the patient, reviewed relevant notes,  laboratory data / imaging studies.  I performed a substantive portion of this encounter and formulated the important aspects of the plan.  I agree with the APP's note, impression, and recommendations; however, I have edited the note to reflect changes or salient points.  Patient is a poor historian due to altered mental status and advanced age and known dementia.  No family present at bedside. History of present illness obtained as review of electronic medical records.  Given his complex past medical history patient currently lives independently and has good support from niece and neighbors who check up on him regularly.  It is felt that he was not been at his baseline since New Year's Eve 07/23/2023.  Arrival patient was found on the floor likely secondary to fall.  He was brought to the hospital for further evaluation and management.  He is currently being worked up for noncardiac issues but cardiology was consulted for atrial fibrillation management and elevated troponin.  PHYSICAL EXAM: Today's Vitals   07/31/23 1500 07/31/23 1530 07/31/23 1600 07/31/23 1651  BP: (!) 101/57 (!) 89/46 108/77   Pulse: (!) 52  85   Resp: (!) 3 (!) 22 12   Temp:    (!) 97.5 F (36.4 C)  TempSrc:    Oral  SpO2: 96%  100%   Weight:      Height:       PainSc:       Body mass index is 21.14 kg/m.   Net IO Since Admission: 1,006.53 mL [07/31/23 1705]  Filed Weights   07/30/23 1819  Weight: 61.2 kg    General: Elderly male, chronically ill, temporal wasting, not alert oriented x 3, hemodynamically stable, no acute distress HEENT: Normocephalic, atraumatic, dry mucous membranes, JVP. Heart: Irregularly irregular, variable S1-S2, systolic ejection murmur at the second intercostal space, no gallops or  rubs Lungs: Decreased breath sounds bilaterally.  No wheezes rales or rhonchi's. Abdomen: Soft, nontender, nondistended, positive bowel sounds in all 4 quadrants, wears an adult diaper. Extremities: Warm to touch, no edema, decreased pulses bilaterally.  EKG: (personally reviewed by me) 07/30/2023: Irregularly irregular, 142 bpm, poor R wave progression, without underlying injury pattern, baseline artifact, similar tracings when compared to prior  Telemetry: (personally reviewed by me) Rate controlled atrial fibrillation   Impression:  Persistent atrial fibrillation Long-term anticoagulation Acute HFrEF, stage C Elevated troponins-demand ischemia Rhabdomyolysis History of nonischemic cardiomyopathy History of atrial flutter status post ablation. History of stroke. Hypothyroidism. Hypertension. History of bladder cancer   Recommendations:  Cardiology is consulted during this hospitalization for atrial fibrillation management and elevated troponin levels.  When he presented to the ED he was in A-fib with RVR and rates since then have improved.  Continue carvedilol  3.125 mg p.o. twice daily for rate control strategy.  Would continue Eliquis  for thromboembolic prophylaxis given his high CHA2DS2-VASc score.  However, given his advanced age, risk for falls, history of GI bleed, history of alcohol  use need to discuss with next of kin with regards to long-term anticoagulation use keeping in mind the risks and benefits of blood thinners.   I do not suspect that he would be a candidate for left atrial appendage closure device i.e. watchman post discharge at this time given the degree of dementia and altered mental status.  With regards to elevated troponin likely secondary to demand ischemia in the setting of acute rhabdomyolysis, status post fall, underlying nonischemic cardiomyopathy and HFrEF.  EKG does not illustrate STEMI.  In the rise and fall and troponins do not suggest ACS.  Continue to address his other active problem list.  With regards to heart failure management uptitration of GDMT is difficult due to soft blood pressures.  Will start low-dose Lasix  for now to help facilitate diuresis as echocardiogram notes elevated RAP.  Start Lasix  20 mg IV push daily with holding parameters.  Echocardiogram independently reviewed, LVEF is severely reduced, global hypokinesis, multiple valvular heart disease.  Final report forthcoming.  Given the degree of dementia and altered mental status would focus on conservative management at this time.  if other disciplines of medicine during this hospitalization are in agreement even palliative care consult is quite appropriate to help address goals of care and to provide patient and his caregivers additional resources within society to help manage his care better.  Cardiology will follow peripherally.  Please reach out sooner if any questions or concerns arise.  Further recommendations to follow as the case evolves.   This note was created using a voice recognition software as a result there may be grammatical errors inadvertently enclosed that do not reflect the nature of this encounter. Every attempt is made to correct such errors.   Madonna Large, DO, [facility]  [address] [place], [place] [address] Pager: [phone] Office: [phone] 07/31/2023 5:05 PM

## 2023-07-31 NOTE — ED Notes (Signed)
 Pt headed to IR at this time

## 2023-08-01 DIAGNOSIS — I10 Essential (primary) hypertension: Secondary | ICD-10-CM | POA: Diagnosis not present

## 2023-08-01 DIAGNOSIS — K819 Cholecystitis, unspecified: Secondary | ICD-10-CM | POA: Diagnosis not present

## 2023-08-01 DIAGNOSIS — M6282 Rhabdomyolysis: Secondary | ICD-10-CM | POA: Diagnosis not present

## 2023-08-01 DIAGNOSIS — I480 Paroxysmal atrial fibrillation: Secondary | ICD-10-CM | POA: Diagnosis not present

## 2023-08-01 LAB — COMPREHENSIVE METABOLIC PANEL
ALT: 21 U/L (ref 0–44)
AST: 67 U/L — ABNORMAL HIGH (ref 15–41)
Albumin: 2.5 g/dL — ABNORMAL LOW (ref 3.5–5.0)
Alkaline Phosphatase: 43 U/L (ref 38–126)
Anion gap: 11 (ref 5–15)
BUN: 19 mg/dL (ref 8–23)
CO2: 21 mmol/L — ABNORMAL LOW (ref 22–32)
Calcium: 8.3 mg/dL — ABNORMAL LOW (ref 8.9–10.3)
Chloride: 106 mmol/L (ref 98–111)
Creatinine, Ser: 0.87 mg/dL (ref 0.61–1.24)
GFR, Estimated: >60 mL/min (ref >60)
Glucose, Bld: 71 mg/dL (ref 70–99)
Potassium: 3.6 mmol/L (ref 3.5–5.1)
Sodium: 138 mmol/L (ref 135–145)
Total Bilirubin: 2 mg/dL — ABNORMAL HIGH (ref 0.0–1.2)
Total Protein: 6.1 g/dL — ABNORMAL LOW (ref 6.5–8.1)

## 2023-08-01 LAB — CBC
HCT: 35.2 % — ABNORMAL LOW (ref 39.0–52.0)
Hemoglobin: 12.3 g/dL — ABNORMAL LOW (ref 13.0–17.0)
MCH: 33.8 pg (ref 26.0–34.0)
MCHC: 34.9 g/dL (ref 30.0–36.0)
MCV: 96.7 fL (ref 80.0–100.0)
Platelets: 130 10*3/uL — ABNORMAL LOW (ref 150–400)
RBC: 3.64 MIL/uL — ABNORMAL LOW (ref 4.22–5.81)
RDW: 14 % (ref 11.5–15.5)
WBC: 5.8 10*3/uL (ref 4.0–10.5)
nRBC: 0 % (ref 0.0–0.2)

## 2023-08-01 MED ORDER — LEVOTHYROXINE SODIUM 25 MCG PO TABS: 25.0000 ug | ORAL_TABLET | Freq: Every day | ORAL | Status: DC

## 2023-08-01 MED ORDER — LEVOTHYROXINE SODIUM 75 MCG PO TABS: 75.0000 ug | ORAL_TABLET | Freq: Every day | ORAL | Status: DC

## 2023-08-01 MED ORDER — LEVOTHYROXINE SODIUM 50 MCG PO TABS: 50.0000 ug | ORAL_TABLET | Freq: Every day | ORAL | Status: DC

## 2023-08-01 MED ORDER — LEVOTHYROXINE SODIUM 50 MCG PO TABS
50.0000 ug | ORAL_TABLET | Freq: Every day | ORAL | Status: DC
  Administered 2023-08-02: 50 ug via ORAL
  Filled 2023-08-01: qty 1

## 2023-08-01 MED ORDER — FUROSEMIDE 40 MG PO TABS
40.0000 mg | ORAL_TABLET | Freq: Every day | ORAL | Status: DC
  Administered 2023-08-01 – 2023-08-02 (×2): 40 mg via ORAL
  Filled 2023-08-01 (×2): qty 1

## 2023-08-01 NOTE — TOC Initial Note (Signed)
 Transition of Care [facility]) - Initial/Assessment Note    Patient Details  Name: Lucas Fuller MRN: [id number] Date of Birth: [birth year]  Transition of Care [facility]) CM/SW Contact:    Lucas GORMAN Kindle, LCSW Phone Number: 08/01/2023, 12:39 PM  Clinical Narrative:                 CSW received consult for possible SNF placement at time of discharge. CSW spoke with patient's niece. She reported she is his only family support and patient lives alone. She stated she is unable to care for patient at their home given patient's current physical needs and fall risk. Patient expressed understanding of PT recommendation but stated that she believes patient will need to go to hospice at discharge and not SNF. CSW will clarify with MD; palliative consulted.    Skilled Nursing Rehab Facilities-   [email or web address] Ratings out of 5 stars (the highest)   Name Address  Phone # Quality Care Staffing Health Inspection Overall  [facility] [address] [phone] 2 2 5 5   [facility] [address] [place] [phone] 4 2 4 4   [facility] [address], [place] [phone] [facility] ([facility]) [address] [place] [phone] 3 1 4 3   [facility]  [address], [place] [phone] 4 4 5 5   [facility] [address] [facility] [phone] 3 2 2 2   [facility] [address], [place] [phone] 5 1 2 2   [facility] [phone] [address], [place] [phone] 1 1 3 1   [address] ([place]) [address] [place] [phone] 2 2 2 2   [facility] [address] [place], [place] [phone] 2 2 1 1   [facility] ([place]) [address] [place], [place] [phone] 3 1 1 1   Clotilda Pereyra [address] [place] [phone] 3 3 4 4   [facility] [address], [place] [phone] 3 4 3 3           [facility] [address] [place] [phone] 4 2 1 1   [facility] [address] [place] [phone] 2 2 2 2   [facility] [address] [place] [phone] [address] [place] [address], [place] [phone] 1 1 2 1   [facility] [address], [employer] [phone] 2 1 4 3           [address] (no [facility]) [address], [place] [phone] 4 4 5 5   [facility] (No [employer]) [address] [place] [phone] 1 2 4 3   [facility] (No [employer]) [address], [place] [phone] 4 1 5 4   [facility] [address], [employer] [phone] 3 4 2 2   [facility] [address], [place] [phone] 2 1 2 1   [facility] [address] [place] [phone] 2 1 1 1   [place] [address] [place] [phone] 4 2 5 5   [facility]  [address] [place] [phone] 2 2 3 3   [facility] [address] [place] [phone] 4 1 1 1   [facility] [address] [place], [place] [phone] 2 2 2 2           [facility] [address] [place] [phone] 2 1 1 1   [facility] [address], [place]  [phone] 3 3 3 3   [facility] [address], [place] [phone] 4  3 5 5   [facility] [place] [address] [place] [phone] 2 1 1 1   [facility] (No Hum[employer]) [address], [place] [phone] 2 2 4 4   [facility] [address] [place] [phone] 2 1 2 1           [facility] [address] [place], [place] [phone] 5 4 5 5   [facility])  [address] [place] [phone] 1 1 2 1   [facility]) [address] [place] [phone]  2 4 4   [facility] [address] [place] [phone] 3 5 5 5   [address] [place], [place] [phone] 4 2 2 2   [facility]) [address] [place] [phone] 2 1 3 2         Barriers to Discharge: Continued Medical Work up   Patient Goals and CMS Choice Patient states their goals for this hospitalization and ongoing recovery are:: Comfort CMS Medicare.gov Compare Post Acute Care list provided to:: [name] Choice offered to / list presented to :  (Niece)      Expected Discharge Plan and Services In-house Referral: Clinical Social Work, Hospice / Palliative Care   Post Acute Care Choice: Hospice Living arrangements for the past 2 months: Single Family Home                                      Prior Living Arrangements/Services Living arrangements for the past 2 months: Single Family Home Lives with:: Self Patient language and need for interpreter reviewed:: Yes Do you feel safe going back to the place where you live?: Yes      Need for Family Participation in Patient Care: Yes (Comment) Care giver support system in place?: Yes (comment)   Criminal Activity/Legal Involvement Pertinent to Current Situation/Hospitalization: No - Comment as needed  Activities of Daily Living   ADL Screening (condition at time of admission) Is the patient deaf or have difficulty hearing?: No Does the patient have difficulty seeing, even when wearing glasses/contacts?: No Does the patient have difficulty concentrating, remembering, or making decisions?: Yes  Permission Sought/Granted Permission sought to share information with : Facility Medical Sales Representative, Family Supports Permission granted to share information with : Yes, Verbal Permission Granted  Share Information with NAME: Erminio  Permission granted to share info w AGENCY: SNFs  Permission granted to share info w Relationship: Niece  Permission granted to share info w Contact Information: [phone]  Emotional Assessment Appearance:: Appears stated age Attitude/Demeanor/Rapport: Unable to Assess Affect (typically observed): Unable to Assess Orientation: :  Oriented to Self Alcohol  / Substance Use: Not Applicable Psych Involvement: No (comment)  Admission diagnosis:  Acalculous cholecystitis [K81.9] Cholecystitis [K81.9] Weakness [R53.1] Fall, initial encounter [W19.XXXA] Patient Active Problem List   Diagnosis Date Noted   Acalculous cholecystitis 07/31/2023   Acute metabolic encephalopathy 07/31/2023   Elevated bilirubin 07/31/2023   Fall at home, initial encounter 07/31/2023   Rhabdomyolysis 07/31/2023   Elevated troponin 07/31/2023   Atrial fibrillation with rapid ventricular response (HCC) 07/31/2023   Persistent atrial fibrillation (HCC) 07/31/2023   Current use of long term anticoagulation 07/31/2023   Elevated troponin I level 07/31/2023   Nonischemic cardiomyopathy (HCC) 07/31/2023   History of bladder cancer 11/08/2021   Pure hypercholesterolemia 11/08/2021   CHF exacerbation (HCC) 11/08/2021   AKI (acute kidney injury) (HCC) 11/08/2021  History of CVA (cerebrovascular accident) 08/26/2021   Chronic combined systolic and diastolic CHF (congestive heart failure) (HCC) 08/26/2021   Essential hypertension 06/24/2018   Malnutrition of moderate degree 12/07/2015   Acute HFrEF (heart failure with reduced ejection fraction) (HCC)    Cardiomyopathy (HCC)    Hypothyroidism 12/05/2015   ETOH abuse 12/05/2015   Thrombocytopenia (HCC) 12/05/2015   Lumbar disc disease 12/05/2015   Combined 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) 12/05/2015   Paroxysmal atrial fibrillation (HCC)    PCP:  Ransom Other, MD Pharmacy:   [facility] [phone] - [place], [place] - [address] AT [facility] [address] [place] [address] Phone: [phone] Fax: [phone]     Social Drivers of Health (SDOH) Social History: SDOH Screenings   Housing: Low Risk  (11/10/2021)  Transportation Needs: Patient Unable To Answer (08/01/2023)  Alcohol  Screen: Low Risk  (11/10/2021)  Tobacco  Use: Low Risk  (07/31/2023)   SDOH Interventions:     Readmission Risk Interventions     No data to display

## 2023-08-01 NOTE — Plan of Care (Signed)

## 2023-08-01 NOTE — Progress Notes (Addendum)
 PROGRESS NOTE        PATIENT DETAILS Name: Lucas Fuller Age: 83 y.o. Sex: male Date of Birth: 01-29-1941 Admit Date: 07/30/2023 Admitting Physician Micaela Speaker, MD ERE:[email or web address], Ardell, MD  Brief Summary: Patient is a 83 y.o.  male with history of chronic HFrEF, PAF, HTN, HLD, prior CVA, bladder cancer-presented to ED after being found in his home on the floor.  Significant events: 1/7>> admit to [facility]  Significant studies: 1/7>> CT head: No acute findings 1/7>> RUQ ultrasound: Sludge with increased wall rella Chancy sign 1/8>> CT angio chest: No PE-groundglass opacity/intralobular septal thickening-pulm edema versus atypical pneumonia 1/8>> CT abdomen/pelvis: Anasarca-bladder wall thickening 1/8>> HIDA scan: Negative 1/8>> echo: EF 20-25%, RV systolic function is moderately reduced.  Mild to moderate MR, moderate TR  Significant microbiology data: 1/8>> blood culture: No growth  Procedures: None  Consults: Cardiology  Subjective: Lying comfortably in bed answer simple questions appropriately-no family at bedside.  Objective: Vitals: Blood pressure 105/72, pulse 76, temperature 98.4 F (36.9 C), temperature source Axillary, resp. rate 17, height 5' 7 (1.702 m), weight 66.2 kg, SpO2 98%.   Exam: Gen Exam:Alert awake-not in any distress-chronically sick/frail appearing. HEENT:atraumatic, normocephalic Chest: B/L clear to auscultation anteriorly CVS:S1S2 regular Abdomen:soft non tender, non distended Extremities:no edema Neurology: Non focal-Generalized Weakness. Skin: no rash  Pertinent Labs/Radiology:    Latest Ref Rng & Units 08/01/2023    4:22 AM 07/31/2023    4:02 AM 07/30/2023    8:03 PM  CBC  WBC 4.0 - 10.5 K/uL 5.8  9.0    Hemoglobin 13.0 - 17.0 g/dL 87.6  85.2  84.9   Hematocrit 39.0 - 52.0 % 35.2  43.8  44.0   Platelets 150 - 400 K/uL 130  132      Lab Results  Component Value Date   NA 138 08/01/2023   K 3.6  08/01/2023   CL 106 08/01/2023   CO2 21 (L) 08/01/2023     Assessment/Plan: Mechanical fall versus syncope Poor historian-suspect has some cognitive issues Significant decrease in ejection fraction on echo-so could have had cardiogenic syncope causing mechanical fall. Telemetry monitoring-but per cardiology note-not a candidate for aggressive care including ICD placement which I agree with. See palliative care discussion below-DNR Mobilize with PT/OT.  Rhabdomyolysis Likely secondary to being down on the floor for a while CK levels have downtrended with supportive care  Paroxysmal A-fib RVR RVR on initial presentation-currently rate controlled Coreg /Eliquis   Chronic HFrEF Biventricular heart failure Euvolemic Frail-advanced age-appears to have some cognitive issues-agree with cardiology-not a candidate for aggressive care Continue Coreg  Volume status stable on diuretics  HTN Controlled with Coreg  Lisinopril  remains on hold.  Cholelithiasis No clinical findings of cholecystitis-furthermore HIDA scan negative Do not think patient is a candidate for cholecystectomy at this point.  History of CVA Continue Eliquis  Statin on hold due to elevated CK  Hypothyroidism TSH minimally elevated Resume Synthroid .  History bladder cancer   Dementia Mildly confused-easily redirected today Maintain delirium precautions  Palliative care Long discussion with niece-[name] Smith-1/9-tenuous situation explain-possibility that he could have syncopized from V. tach  which could have resulted in a fall.  He is very frail-elderly-has dementia-and per cardiology he is not a candidate for aggressive care.  After extensive discussion-he is a DNR-family agreeable to discuss further with palliative care.  BMI: Estimated body mass index is 22.86  kg/m as calculated from the following:   Height as of this encounter: 5' 7 (1.702 m).   Weight as of this encounter: 66.2 kg.   Code status:    Code Status: Full Code   DVT Prophylaxis: SCDs Start: 07/31/23 0310 Place TED hose Start: 07/31/23 0310 apixaban  (ELIQUIS ) tablet 5 mg     Family Communication: niece-[name] Smith-[phone] updated on 1/9   Disposition Plan: Status is: Inpatient Remains inpatient appropriate because: Severity of illness   Planned Discharge Destination:Skilled nursing facility   Diet: Diet Order             Diet Heart Room service appropriate? Yes; Fluid consistency: Thin; Fluid restriction: 2000 mL Fluid  Diet effective now                     Antimicrobial agents: Anti-infectives (From admission, onward)    Start     Dose/Rate Route Frequency Ordered Stop   07/31/23 1000  metroNIDAZOLE  (FLAGYL ) IVPB 500 mg        500 mg 100 mL/hr over 60 Minutes Intravenous Every 12 hours 07/31/23 0309 08/03/23 2159   07/31/23 0800  cefTRIAXone  (ROCEPHIN ) 2 g in sodium chloride  0.9 % 100 mL IVPB        2 g 200 mL/hr over 30 Minutes Intravenous Every 24 hours 07/31/23 0309 08/06/23 0759   07/31/23 0300  azithromycin  (ZITHROMAX ) 500 mg in sodium chloride  0.9 % 250 mL IVPB        500 mg 250 mL/hr over 60 Minutes Intravenous  Once 07/31/23 0259 07/31/23 0605   07/30/23 2315  cefTRIAXone  (ROCEPHIN ) 1 g in sodium chloride  0.9 % 100 mL IVPB        1 g 200 mL/hr over 30 Minutes Intravenous  Once 07/30/23 2301 07/31/23 0021   07/30/23 2315  metroNIDAZOLE  (FLAGYL ) IVPB 500 mg        500 mg 100 mL/hr over 60 Minutes Intravenous  Once 07/30/23 2301 07/31/23 0144        MEDICATIONS: Scheduled Meds:  apixaban   5 mg Oral BID   carvedilol   3.125 mg Oral BID WC   furosemide   20 mg Intravenous Daily   sodium chloride  flush  3 mL Intravenous Q12H   sodium chloride  flush  3 mL Intravenous Q12H   Continuous Infusions:  cefTRIAXone  (ROCEPHIN )  IV 2 g (08/01/23 0845)   metronidazole  500 mg (08/01/23 0843)   PRN Meds:.acetaminophen  **OR** acetaminophen , ondansetron  **OR** ondansetron  (ZOFRAN ) IV,  senna-docusate, sodium chloride  flush   I have personally reviewed following labs and imaging studies  LABORATORY DATA: CBC: Recent Labs  Lab 07/30/23 1913 07/30/23 2003 07/31/23 0402 08/01/23 0422  WBC 9.7  --  9.0 5.8  NEUTROABS 8.1*  --   --   --   HGB 14.9 15.0 14.7 12.3*  HCT 41.9 44.0 43.8 35.2*  MCV 98.8  --  100.7* 96.7  PLT 199  --  132* 130*    Basic Metabolic Panel: Recent Labs  Lab 07/30/23 1913 07/30/23 2003 07/31/23 0402 08/01/23 0422  NA 137 136 135 138  K 4.7 6.2* 4.7 3.6  CL 102  --  104 106  CO2 18*  --  17* 21*  GLUCOSE 93  --  83 71  BUN 19  --  18 19  CREATININE 0.92  --  0.91 0.87  CALCIUM  8.9  --  8.4* 8.3*  MG 2.1  --   --   --     GFR: Estimated  Creatinine Clearance: 60.1 mL/min (by C-G formula based on SCr of 0.87 mg/dL).  Liver Function Tests: Recent Labs  Lab 07/30/23 1913 07/31/23 0402 08/01/23 0422  AST 84* 81* 67*  ALT 21 18 21   ALKPHOS 50 45 43  BILITOT 2.9* 2.5* 2.0*  PROT 7.5 6.8 6.1*  ALBUMIN 3.1* 2.7* 2.5*   No results for input(s): LIPASE, AMYLASE in the last 168 hours. Recent Labs  Lab 07/30/23 1913  AMMONIA 26    Coagulation Profile: No results for input(s): INR, PROTIME in the last 168 hours.  Cardiac Enzymes: Recent Labs  Lab 07/31/23 0030 07/31/23 1620  CKTOTAL 1,730* 1,089*    BNP (last 3 results) No results for input(s): PROBNP in the last 8760 hours.  Lipid Profile: No results for input(s): CHOL, HDL, LDLCALC, TRIG, CHOLHDL, LDLDIRECT in the last 72 hours.  Thyroid  Function Tests: Recent Labs    07/30/23 2135 07/31/23 0450  TSH  --  5.442*  FREET4 1.23*  --     Anemia Panel: No results for input(s): VITAMINB12, FOLATE, FERRITIN, TIBC, IRON, RETICCTPCT in the last 72 hours.  Urine analysis:    Component Value Date/Time   COLORURINE AMBER (A) 07/30/2023 1952   APPEARANCEUR HAZY (A) 07/30/2023 1952   LABSPEC 1.027 07/30/2023 1952   PHURINE 5.0  07/30/2023 1952   GLUCOSEU NEGATIVE 07/30/2023 1952   HGBUR NEGATIVE 07/30/2023 1952   BILIRUBINUR SMALL (A) 07/30/2023 1952   KETONESUR 5 (A) 07/30/2023 1952   PROTEINUR 100 (A) 07/30/2023 1952   UROBILINOGEN 0.2 06/22/2009 0723   NITRITE NEGATIVE 07/30/2023 1952   LEUKOCYTESUR NEGATIVE 07/30/2023 1952    Sepsis Labs: Lactic Acid, Venous    Component Value Date/Time   LATICACIDVEN 1.9 11/13/2022 1809    MICROBIOLOGY: Recent Results (from the past 240 hours)  Culture, blood (Routine X 2) w Reflex to ID Panel     Status: None (Preliminary result)   Collection Time: 07/31/23  6:25 AM   Specimen: BLOOD  Result Value Ref Range Status   Specimen Description BLOOD SITE NOT SPECIFIED  Final   Special Requests   Final    AEROBIC BOTTLE ONLY Blood Culture results may not be optimal due to an inadequate volume of blood received in culture bottles   Culture   Final    NO GROWTH 1 DAY Performed at [facility], [address], [place], [place] [address]    Report Status PENDING  Incomplete  Culture, blood (Routine X 2) w Reflex to ID Panel     Status: None (Preliminary result)   Collection Time: 07/31/23  6:26 AM   Specimen: BLOOD LEFT HAND  Result Value Ref Range Status   Specimen Description BLOOD LEFT HAND  Final   Special Requests   Final    AEROBIC BOTTLE ONLY Blood Culture results may not be optimal due to an inadequate volume of blood received in culture bottles   Culture   Final    NO GROWTH 1 DAY Performed at [facility], [address] [place], [place] [address]    Report Status PENDING  Incomplete    RADIOLOGY STUDIES/RESULTS: ECHOCARDIOGRAM COMPLETE Result Date: 07/31/2023    ECHOCARDIOGRAM REPORT   Patient Name:   COLAN LAYMON Date of Exam: 07/31/2023 Medical Rec #:  [id number]      Height:       67.0 in Accession #:    [id number]     Weight:       135.0 lb Date  of Birth:  1941/07/08      BSA:          1.711 m Patient Age:    82 years       BP:            103/73 mmHg Patient Gender: M              HR:           73 bpm. Exam Location:  Inpatient Procedure: 2D Echo, Cardiac Doppler, Color Doppler and Intracardiac            Opacification Agent Indications:    Elevated Troponin  History:        Patient has prior history of Echocardiogram examinations, most                 recent 11/09/2021. Cardiomyopathy and CHF, Migraine,                 Arrythmias:Atrial Fibrillation and Atrial Flutter,                 Signs/Symptoms:Shortness of Breath; Risk Factors:Hypertension.  Sonographer:    Thea Norlander RCS Referring Phys: [phone] SUBRINA SUNDIL IMPRESSIONS  1. Left ventricular ejection fraction, by estimation, is 20 to 25%. The left ventricle has severely decreased function. The left ventricle demonstrates global hypokinesis. The left ventricular internal cavity size was mildly dilated. Left ventricular diastolic parameters are indeterminate.  2. Right ventricular systolic function is moderately reduced. The right ventricular size is normal. There is mildly elevated pulmonary artery systolic pressure. The estimated right ventricular systolic pressure is 42.7 mmHg.  3. Left atrial size was severely dilated.  4. Right atrial size was mildly dilated.  5. Mild to moderate mitral valve regurgitation.  6. Tricuspid valve regurgitation is moderate.  7. There is moderate calcification of the aortic valve. Aortic valve regurgitation is not visualized.  8. The inferior vena cava is dilated in size with <50% respiratory variability, suggesting right atrial pressure of 15 mmHg. FINDINGS  Left Ventricle: Left ventricular ejection fraction, by estimation, is 20 to 25%. The left ventricle has severely decreased function. The left ventricle demonstrates global hypokinesis. Definity  contrast agent was given IV to delineate the left ventricular endocardial borders. The left ventricular internal cavity size was mildly dilated. There is no left ventricular hypertrophy. Left ventricular  diastolic parameters are indeterminate. Right Ventricle: The right ventricular size is normal. Right ventricular systolic function is moderately reduced. There is mildly elevated pulmonary artery systolic pressure. The tricuspid regurgitant velocity is 2.63 m/s, and with an assumed right atrial pressure of 15 mmHg, the estimated right ventricular systolic pressure is 42.7 mmHg. Left Atrium: Left atrial size was severely dilated. Right Atrium: Right atrial size was mildly dilated. Pericardium: There is no evidence of pericardial effusion. Mitral Valve: Mild to moderate mitral valve regurgitation. Tricuspid Valve: Tricuspid valve regurgitation is moderate. Aortic Valve: There is moderate calcification of the aortic valve. Aortic valve regurgitation is not visualized. Aortic valve mean gradient measures 7.0 mmHg. Aortic valve peak gradient measures 13.2 mmHg. Aortic valve area, by VTI measures 1.06 cm. Pulmonic Valve: Pulmonic valve regurgitation is trivial. Aorta: The aortic root and ascending aorta are structurally normal, with no evidence of dilitation. Venous: The inferior vena cava is dilated in size with less than 50% respiratory variability, suggesting right atrial pressure of 15 mmHg. IAS/Shunts: No atrial level shunt detected by color flow Doppler.  LEFT VENTRICLE PLAX 2D LVIDd:  5.10 cm   Diastology LVIDs:         4.80 cm   LV e' medial:    4.26 cm/s LV PW:         0.80 cm   LV E/e' medial:  26.3 LV IVS:        0.80 cm   LV e' lateral:   7.42 cm/s LVOT diam:     2.20 cm   LV E/e' lateral: 15.1 LV SV:         39 LV SV Index:   23 LVOT Area:     3.80 cm  RIGHT VENTRICLE            IVC RV S prime:     4.81 cm/s  IVC diam: 2.40 cm LEFT ATRIUM              Index        RIGHT ATRIUM           Index LA diam:        5.00 cm  2.92 cm/m   RA Area:     17.90 cm LA Vol (A2C):   80.4 ml  46.99 ml/m  RA Volume:   43.00 ml  25.13 ml/m LA Vol (A4C):   124.0 ml 72.47 ml/m LA Biplane Vol: 105.0 ml 61.36 ml/m   AORTIC VALVE                     PULMONIC VALVE AV Area (Vmax):    1.14 cm      PR End Diast Vel: 3.88 msec AV Area (Vmean):   1.09 cm AV Area (VTI):     1.06 cm AV Vmax:           182.00 cm/s AV Vmean:          120.000 cm/s AV VTI:            0.366 m AV Peak Grad:      13.2 mmHg AV Mean Grad:      7.0 mmHg LVOT Vmax:         54.53 cm/s LVOT Vmean:        34.433 cm/s LVOT VTI:          0.102 m LVOT/AV VTI ratio: 0.28  AORTA Ao Root diam: 3.70 cm Ao Asc diam:  3.60 cm MITRAL VALVE                TRICUSPID VALVE MV Area (PHT): 6.48 cm     TR Peak grad:   27.7 mmHg MV Decel Time: 117 msec     TR Vmax:        263.00 cm/s MR Peak grad: 38.3 mmHg MR Vmax:      309.50 cm/s   SHUNTS MV E velocity: 112.00 cm/s  Systemic VTI:  0.10 m                             Systemic Diam: 2.20 cm Ronal Ross Electronically signed by Ronal Ross Signature Date/Time: 07/31/2023/5:26:43 PM    Final    NM Hepatobiliary Liver Func Result Date: 07/31/2023 CLINICAL DATA:  Right upper quadrant pain. Gallbladder sludge on recent ultrasound. EXAM: NUCLEAR MEDICINE HEPATOBILIARY IMAGING TECHNIQUE: Sequential images of the abdomen were obtained out to 60 minutes following intravenous administration of radiopharmaceutical. Due to lack of gallbladder activity at 60 minutes, imaging was continued for another 60 minutes following administration of 2.5 mg  IV morphine . RADIOPHARMACEUTICALS:  7.8 mCi Tc-15m  Choletec  IV COMPARISON:  None Available. FINDINGS: Prompt uptake and biliary excretion of activity by the liver is seen. Biliary activity passes into small bowel, consistent with patent common bile duct. No gallbladder activity was seen at 60 minutes, however, gallbladder activity is present following morphine  administration. IMPRESSION: No evidence of obstruction of cystic duct or common bile duct. Electronically Signed   By: Norleen DELENA Kil M.D.   On: 07/31/2023 14:31   CT Angio Chest PE W and/or Wo Contrast Result Date: 07/31/2023 CLINICAL DATA:   Patient found down by family. Altered mental status since 12/31. Unsteady on feet. Blunt abdominal trauma. Abdominal pain. EXAM: CT ANGIOGRAPHY CHEST CT ABDOMEN AND PELVIS WITH CONTRAST TECHNIQUE: Multidetector CT imaging of the chest was performed using the standard protocol during bolus administration of intravenous contrast. Multiplanar CT image reconstructions and MIPs were obtained to evaluate the vascular anatomy. Multidetector CT imaging of the abdomen and pelvis was performed using the standard protocol during bolus administration of intravenous contrast. RADIATION DOSE REDUCTION: This exam was performed according to the departmental dose-optimization program which includes automated exposure control, adjustment of the mA and/or kV according to patient size and/or use of iterative reconstruction technique. CONTRAST:  75mL OMNIPAQUE  IOHEXOL  350 MG/ML SOLN COMPARISON:  Ultrasound 07/30/2023; chest radiograph 07/30/2023; CTA chest 11/08/2021; CT abdomen and pelvis 09/21/2015 FINDINGS: CTA CHEST FINDINGS Cardiovascular: Cardiomegaly. No pericardial effusion. Reflux of contrast into the IVC and hepatic veins compatible with elevated right heart pressures. Negative for acute pulmonary embolism. Coronary artery and aortic atherosclerotic calcification. Mediastinum/Nodes: Trachea and esophagus are unremarkable. No thoracic adenopathy. Lungs/Pleura: Patchy bilateral ground-glass opacities with interlobular septal thickening. Diffuse bronchial wall thickening. Trace bilateral pleural effusions. No pneumothorax. Musculoskeletal: No acute fracture. Review of the MIP images confirms the above findings. CT ABDOMEN and PELVIS FINDINGS Hepatobiliary: Respiratory motion limits assessment of the upper abdomen. Questionable nodular hepatic contour which can be seen with cirrhosis. No definite gallbladder abnormality though motion limits assessment of the gallbladder wall. No definite biliary dilation. Pancreas: Unremarkable.  Spleen: Unremarkable. Adrenals/Urinary Tract: Stable adrenal glands. Cortical geographic hypoattenuation in the kidneys could be due to chronic scarring or pyelonephritis. No urinary calculi or hydronephrosis. Bladder wall thickening with perivesical stranding. Stomach/Bowel: Normal caliber large and small bowel. Stool ball in the rectum. Stomach and appendix are within normal limits. Vascular/Lymphatic: Aortic atherosclerosis. No enlarged abdominal or pelvic lymph nodes. Reproductive: Unremarkable. Other: Mesenteric and body wall edema. Small volume abdominopelvic ascites. No free intraperitoneal air. Musculoskeletal: No acute fracture. Review of the MIP images confirms the above findings. IMPRESSION: 1. Negative for acute pulmonary embolism. 2. Cardiomegaly with elevated right heart pressures. 3. Ground-glass opacities and interlobular septal thickening with trace bilateral pleural effusions. Prominent differential considerations are pulmonary edema and atypical pneumonia. 4. Anasarca with small volume abdominopelvic ascites. 5. Bladder wall thickening with perivesical stranding. Correlate with urinalysis to exclude cystitis. 6. Cortical geographic hypoattenuation in the kidneys could be due to chronic scarring or pyelonephritis. 7. Questionable nodular hepatic contour which can be seen with cirrhosis. Aortic Atherosclerosis (ICD10-I70.0). Electronically Signed   By: Norman Gatlin M.D.   On: 07/31/2023 02:04   CT ABDOMEN PELVIS W CONTRAST Result Date: 07/31/2023 CLINICAL DATA:  Patient found down by family. Altered mental status since 12/31. Unsteady on feet. Blunt abdominal trauma. Abdominal pain. EXAM: CT ANGIOGRAPHY CHEST CT ABDOMEN AND PELVIS WITH CONTRAST TECHNIQUE: Multidetector CT imaging of the chest was performed using the standard protocol during bolus administration of  intravenous contrast. Multiplanar CT image reconstructions and MIPs were obtained to evaluate the vascular anatomy. Multidetector CT  imaging of the abdomen and pelvis was performed using the standard protocol during bolus administration of intravenous contrast. RADIATION DOSE REDUCTION: This exam was performed according to the departmental dose-optimization program which includes automated exposure control, adjustment of the mA and/or kV according to patient size and/or use of iterative reconstruction technique. CONTRAST:  75mL OMNIPAQUE  IOHEXOL  350 MG/ML SOLN COMPARISON:  Ultrasound 07/30/2023; chest radiograph 07/30/2023; CTA chest 11/08/2021; CT abdomen and pelvis 09/21/2015 FINDINGS: CTA CHEST FINDINGS Cardiovascular: Cardiomegaly. No pericardial effusion. Reflux of contrast into the IVC and hepatic veins compatible with elevated right heart pressures. Negative for acute pulmonary embolism. Coronary artery and aortic atherosclerotic calcification. Mediastinum/Nodes: Trachea and esophagus are unremarkable. No thoracic adenopathy. Lungs/Pleura: Patchy bilateral ground-glass opacities with interlobular septal thickening. Diffuse bronchial wall thickening. Trace bilateral pleural effusions. No pneumothorax. Musculoskeletal: No acute fracture. Review of the MIP images confirms the above findings. CT ABDOMEN and PELVIS FINDINGS Hepatobiliary: Respiratory motion limits assessment of the upper abdomen. Questionable nodular hepatic contour which can be seen with cirrhosis. No definite gallbladder abnormality though motion limits assessment of the gallbladder wall. No definite biliary dilation. Pancreas: Unremarkable. Spleen: Unremarkable. Adrenals/Urinary Tract: Stable adrenal glands. Cortical geographic hypoattenuation in the kidneys could be due to chronic scarring or pyelonephritis. No urinary calculi or hydronephrosis. Bladder wall thickening with perivesical stranding. Stomach/Bowel: Normal caliber large and small bowel. Stool ball in the rectum. Stomach and appendix are within normal limits. Vascular/Lymphatic: Aortic atherosclerosis. No  enlarged abdominal or pelvic lymph nodes. Reproductive: Unremarkable. Other: Mesenteric and body wall edema. Small volume abdominopelvic ascites. No free intraperitoneal air. Musculoskeletal: No acute fracture. Review of the MIP images confirms the above findings. IMPRESSION: 1. Negative for acute pulmonary embolism. 2. Cardiomegaly with elevated right heart pressures. 3. Ground-glass opacities and interlobular septal thickening with trace bilateral pleural effusions. Prominent differential considerations are pulmonary edema and atypical pneumonia. 4. Anasarca with small volume abdominopelvic ascites. 5. Bladder wall thickening with perivesical stranding. Correlate with urinalysis to exclude cystitis. 6. Cortical geographic hypoattenuation in the kidneys could be due to chronic scarring or pyelonephritis. 7. Questionable nodular hepatic contour which can be seen with cirrhosis. Aortic Atherosclerosis (ICD10-I70.0). Electronically Signed   By: Norman Gatlin M.D.   On: 07/31/2023 02:04   US  Abdomen Limited RUQ (LIVER/GB) Result Date: 07/30/2023 CLINICAL DATA:  Transaminitis EXAM: ULTRASOUND ABDOMEN LIMITED RIGHT UPPER QUADRANT COMPARISON:  CT 09/21/2015 FINDINGS: Gallbladder: Sludge filled gallbladder. Increased wall thickness at 4.9 mm. Negative sonographic Murphy. Trace pericholecystic fluid Common bile duct: Diameter: 4.7 mm Liver: No focal hepatic abnormality. Echogenicity within normal limits. Trace perihepatic ascites. Questionable surface nodularity of the liver portal vein is patent on color Doppler imaging with normal direction of blood flow towards the liver. Other: Trace perihepatic ascites IMPRESSION: 1. Sludge filled gallbladder with increased wall thickness and trace pericholecystic fluid but negative sonographic Murphy. If acute gallbladder disease is a concern, correlation with nuclear medicine hepatobiliary imaging could be obtained. 2. Trace perihepatic ascites. Questionable surface nodularity of  the liver, correlate for cirrhosis risk factors. Electronically Signed   By: Luke Bun M.D.   On: 07/30/2023 22:31   CT Head Wo Contrast Result Date: 07/30/2023 CLINICAL DATA:  Mental status change EXAM: CT HEAD WITHOUT CONTRAST TECHNIQUE: Contiguous axial images were obtained from the base of the skull through the vertex without intravenous contrast. RADIATION DOSE REDUCTION: This exam was performed according to the departmental dose-optimization  program which includes automated exposure control, adjustment of the mA and/or kV according to patient size and/or use of iterative reconstruction technique. COMPARISON:  CT brain 06/18/2022 trauma MRI 08/26/2021 FINDINGS: Brain: Motion degradation limits the exam. No gross hemorrhage or intracranial mass. Atrophy and chronic small vessel ischemic changes of the white matter. Chronic right parietal infarct. Interval right posterior frontal infarct, series 3, image 25, suspect chronic but new from prior head CT. Interval hypodensity at the right posterior temporal and parietal lobes consistent with infarct, probably chronic but limited by motion degradation and more extensive when compared to the CT from November of 2023. Ventricles are nonenlarged. Vascular: No hyperdense vessels.  Carotid vascular calcification Skull: Normal. Negative for fracture or focal lesion. Sinuses/Orbits: Moderate mucosal thickening within the paranasal sinuses. Other: None IMPRESSION: 1. Significantly limited by motion degradation. No obvious hemorrhage, mass or mass effect. 2. Atrophy and chronic small vessel ischemic changes of the white matter 3. Prior chronic right parietal infarct. Interval right posterior frontal and right temporoparietal areas of suspected infarction, suspect chronic but new as compared with head CT from 2023 and assessment is limited by significant motion degradation. Electronically Signed   By: Luke Bun M.D.   On: 07/30/2023 20:59   DG Chest Port 1  View Result Date: 07/30/2023 CLINICAL DATA:  Altered mental status EXAM: PORTABLE CHEST 1 VIEW COMPARISON:  Chest x-ray 11/13/2022 FINDINGS: The heart is enlarged, unchanged. There is no pleural effusion or pneumothorax. Both lungs are clear. The visualized skeletal structures are unremarkable. IMPRESSION: 1. No active disease. 2. Cardiomegaly. Electronically Signed   By: Greig Pique M.D.   On: 07/30/2023 19:29     LOS: 1 day   Donalda Applebaum, MD  [facility]    To contact the attending provider between 7A-7P or the covering provider during after hours 7P-7A, please log into the web site [email or web address] and access using universal [place] password for that web site. If you do not have the password, please call the hospital operator.  08/01/2023, 11:56 AM

## 2023-08-01 NOTE — NC FL2 (Signed)
 [place]  MEDICAID FL2 LEVEL OF CARE FORM     IDENTIFICATION  Patient Name: Lucas Fuller Birthdate: [birth year] Sex: male Admission Date (Current Location): 07/30/2023  [facility] and [place] Number:  Producer, Television/film/video and Address:  The . [facility], [address] [place], [place] [address]      Provider Number: [id number]  Attending Physician Name and Address:  Raenelle Donalda HERO, MD  Relative Name and Phone Number:       Current Level of Care: Hospital Recommended Level of Care: Skilled Nursing Facility Prior Approval Number:    Date Approved/Denied:   PASRR Number: [id number]  Discharge Plan: SNF    Current Diagnoses: Patient Active Problem List   Diagnosis Date Noted   Acalculous cholecystitis 07/31/2023   Acute metabolic encephalopathy 07/31/2023   Elevated bilirubin 07/31/2023   Fall at home, initial encounter 07/31/2023   Rhabdomyolysis 07/31/2023   Elevated troponin 07/31/2023   Atrial fibrillation with rapid ventricular response (HCC) 07/31/2023   Persistent atrial fibrillation (HCC) 07/31/2023   Current use of long term anticoagulation 07/31/2023   Elevated troponin I level 07/31/2023   Nonischemic cardiomyopathy (HCC) 07/31/2023   History of bladder cancer 11/08/2021   Pure hypercholesterolemia 11/08/2021   CHF exacerbation (HCC) 11/08/2021   AKI (acute kidney injury) (HCC) 11/08/2021   History of CVA (cerebrovascular accident) 08/26/2021   Chronic combined systolic and diastolic CHF (congestive heart failure) (HCC) 08/26/2021   Essential hypertension 06/24/2018   Malnutrition of moderate degree 12/07/2015   Acute HFrEF (heart failure with reduced ejection fraction) (HCC)    Cardiomyopathy (HCC)    Hypothyroidism 12/05/2015   ETOH abuse 12/05/2015   Thrombocytopenia (HCC) 12/05/2015   Lumbar disc disease 12/05/2015   Combined 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) 12/05/2015   Paroxysmal atrial fibrillation  (HCC)     Orientation RESPIRATION BLADDER Height & Weight     Self  Normal Incontinent, External catheter Weight: 145 lb 15.1 oz (66.2 kg) Height:  5' 7 (170.2 cm)  BEHAVIORAL SYMPTOMS/MOOD NEUROLOGICAL BOWEL NUTRITION STATUS      Continent Diet (See dc summary)  AMBULATORY STATUS COMMUNICATION OF NEEDS Skin   Extensive Assist Verbally Normal                       Personal Care Assistance Level of Assistance  Bathing, Feeding, Dressing Bathing Assistance: Maximum assistance Feeding assistance: Limited assistance Dressing Assistance: Maximum assistance     Functional Limitations Info  Sight, Hearing Sight Info: Impaired Hearing Info: Impaired      SPECIAL CARE FACTORS FREQUENCY  PT (By licensed PT), OT (By licensed OT)     PT Frequency: 5x/week OT Frequency: 5x/week            Contractures Contractures Info: Not present    Additional Factors Info  Code Status, Allergies Code Status Info: Full Allergies Info: Asa           Current Medications (08/01/2023):  This is the current hospital active medication list Current Facility-Administered Medications  Medication Dose Route Frequency Provider Last Rate Last Admin   acetaminophen  (TYLENOL ) tablet 650 mg  650 mg Oral Q6H PRN Sundil, Subrina, MD       Or   acetaminophen  (TYLENOL ) suppository 650 mg  650 mg Rectal Q6H PRN Sundil, Subrina, MD       apixaban  (ELIQUIS ) tablet 5 mg  5 mg Oral BID Sundil, Subrina, MD   5 mg at 08/01/23 [phone]  carvedilol  (COREG ) tablet 3.125 mg  3.125 mg Oral BID WC Sundil, Subrina, MD   3.125 mg at 08/01/23 0842   cefTRIAXone  (ROCEPHIN ) 2 g in sodium chloride  0.9 % 100 mL IVPB  2 g Intravenous Q24H Sundil, Subrina, MD 200 mL/hr at 08/01/23 0845 2 g at 08/01/23 0845   furosemide  (LASIX ) injection 20 mg  20 mg Intravenous Daily Tolia, Sunit, DO   20 mg at 08/01/23 9157   metroNIDAZOLE  (FLAGYL ) IVPB 500 mg  500 mg Intravenous Q12H Sundil, Subrina, MD 100 mL/hr at 08/01/23 0843 500 mg at  08/01/23 0843   ondansetron  (ZOFRAN ) tablet 4 mg  4 mg Oral Q6H PRN Sundil, Subrina, MD       Or   ondansetron  (ZOFRAN ) injection 4 mg  4 mg Intravenous Q6H PRN Sundil, Subrina, MD       senna-docusate (Senokot-S) tablet 1 tablet  1 tablet Oral QHS PRN Sundil, Subrina, MD       sodium chloride  flush (NS) 0.9 % injection 3 mL  3 mL Intravenous Q12H Sundil, Subrina, MD   3 mL at 08/01/23 0851   sodium chloride  flush (NS) 0.9 % injection 3 mL  3 mL Intravenous Q12H Sundil, Subrina, MD   3 mL at 08/01/23 0851   sodium chloride  flush (NS) 0.9 % injection 3 mL  3 mL Intravenous PRN Sundil, Subrina, MD         Discharge Medications: Please see discharge summary for a list of discharge medications.  Relevant Imaging Results:  Relevant Lab Results:   Additional Information SSn: [id number] [place], [place]

## 2023-08-01 NOTE — Evaluation (Signed)
 Physical Therapy Evaluation Patient Details Name: Lucas Fuller MRN: [id number] DOB: [birth year] Today's Date: 08/01/2023  History of Present Illness  Hendrixx Severin is an 83 y/o male admitted 07/30/23 after being found on the floor with AMS at home by family. Dx with acalculous cholecystitis, fall, hyperbilirubinemia, elevated troponin, and rhabdomyolysis. PMH includes CVA (2023) thyroid  disease, nonischemic cardiomyopathy, atrial fibrillation, CHF, dementia, bladder cancer, hypothyroidism.  Clinical Impression  Received pt semi-reclined in bed. Pt pleasant and agreeable to PT evaluation. Pt performed bed mobility with CGA using bed features, transfers with RW and min A, and ambulated into hallway with RW and min A with min cues for technique/safety. No family at bedside and need to confirm home set up with niece. However, appears that pt lives alone in 1 level home with no STE and was mod I using rollator prior to admission. Currently recommend 24/7 supervision/assist but unsure if niece is able to provide this level of care. Currently recommend continued PT services in less intensive setting <3 hours/day to maximize strength/balance. Acute PT to cont to follow.       If plan is discharge home, recommend the following: A little help with walking and/or transfers;A little help with bathing/dressing/bathroom;Assistance with cooking/housework;Direct supervision/assist for medications management;Assist for transportation;Help with stairs or ramp for entrance;Supervision due to cognitive status   Can travel by private vehicle   Yes    Equipment Recommendations Other (comment) (TBD in next venue)  Recommendations for Other Services       Functional Status Assessment Patient has had a recent decline in their functional status and demonstrates the ability to make significant improvements in function in a reasonable and predictable amount of time.     Precautions / Restrictions Precautions Precautions:  Fall Precaution Comments: dementia Restrictions Weight Bearing Restrictions Per Provider Order: No      Mobility  Bed Mobility Overal bed mobility: Needs Assistance Bed Mobility: Rolling, Supine to Sit, Sit to Supine Rolling: Contact guard assist   Supine to sit: Contact guard, HOB elevated, Used rails Sit to supine: Contact guard assist   General bed mobility comments: increased time and cues for rolling technique to reach bedrails Patient Response: Cooperative  Transfers Overall transfer level: Needs assistance Equipment used: Rolling walker (2 wheels) Transfers: Sit to/from Stand Sit to Stand: Min assist           General transfer comment: stood from EOB with RW and light min A    Ambulation/Gait Ambulation/Gait assistance: Editor, Commissioning (Feet): 34 Feet Assistive device: Rolling walker (2 wheels) Gait Pattern/deviations: Step-through pattern, Decreased step length - right, Decreased step length - left, Decreased stride length, Narrow base of support, Trunk flexed Gait velocity: decreased Gait velocity interpretation: <1.31 ft/sec, indicative of household Public Librarian Bed Tilt Bed Patient Response: Cooperative  Modified Rankin (Stroke Patients Only)       Balance Overall balance assessment: Needs assistance Sitting-balance support: Bilateral upper extremity supported, Feet supported Sitting balance-Leahy Scale: Fair Sitting balance - Comments: able to maintain static sitting balance with supervision   Standing balance support: Bilateral upper extremity supported, Reliant on assistive device for balance (RW) Standing balance-Leahy Scale: Poor Standing balance comment: required CGA for static standing balance and min A for dynamic standing balance  Pertinent Vitals/Pain Pain Assessment Pain Assessment: No/denies pain    Home Living  Family/patient expects to be discharged to:: Private residence Living Arrangements: Alone Available Help at Discharge: Family;Available PRN/intermittently (niece Erminio checks on pt occasionally) Type of Home: House Home Access: Other (comment) (denied any stairs but unsure of accurracy of info)       Home Layout: One level Home Equipment: Rollator (4 wheels) Additional Comments: unsure of accurracy of information and not stated in chart. Need to confirm with niece Erminio. Appears that pt was using rollator prior to fall.    Prior Function Prior Level of Function : Independent/Modified Independent                     Extremity/Trunk Assessment   Upper Extremity Assessment Upper Extremity Assessment: Defer to OT evaluation    Lower Extremity Assessment Lower Extremity Assessment: Generalized weakness    Cervical / Trunk Assessment Cervical / Trunk Assessment: Kyphotic  Communication   Communication Communication: No apparent difficulties Cueing Techniques: Verbal cues  Cognition Arousal: Alert Behavior During Therapy: WFL for tasks assessed/performed Overall Cognitive Status: History of cognitive impairments - at baseline                                 General Comments: dementia        General Comments      Exercises     Assessment/Plan    PT Assessment Patient needs continued PT services  PT Problem List Decreased strength;Decreased activity tolerance;Decreased balance;Decreased mobility;Decreased coordination       PT Treatment Interventions DME instruction;Gait training;Stair training;Functional mobility training;Therapeutic activities;Therapeutic exercise;Balance training;Neuromuscular re-education;Patient/family education    PT Goals (Current goals can be found in the Care Plan section)  Acute Rehab PT Goals Patient Stated Goal: did not state PT Goal Formulation: With patient Time For Goal Achievement: 08/15/23 Potential to  Achieve Goals: Fair    Frequency Min 1X/week     Co-evaluation               AM-PAC PT 6 Clicks Mobility  Outcome Measure Help needed turning from your back to your side while in a flat bed without using bedrails?: A Little Help needed moving from lying on your back to sitting on the side of a flat bed without using bedrails?: A Lot Help needed moving to and from a bed to a chair (including a wheelchair)?: A Little Help needed standing up from a chair using your arms (e.g., wheelchair or bedside chair)?: A Little Help needed to walk in hospital room?: A Little Help needed climbing 3-5 steps with a railing? : A Lot 6 Click Score: 16    End of Session Equipment Utilized During Treatment: Gait belt Activity Tolerance: Patient tolerated treatment well Patient left: in bed;with call bell/phone within reach;with bed alarm set Nurse Communication: Mobility status PT Visit Diagnosis: Unsteadiness on feet (R26.81);Other abnormalities of gait and mobility (R26.89);Muscle weakness (generalized) (M62.81)    Time: 0802-0827 PT Time Calculation (min) (ACUTE ONLY): 25 min   Charges:   PT Evaluation $PT Eval Low Complexity: 1 Low PT Treatments $Gait Training: 8-22 mins PT General Charges $$ ACUTE PT VISIT: 1 Visit         Therisa Stains PT, DPT Therisa HERO Zaunegger 08/01/2023, 9:24 AM

## 2023-08-01 NOTE — Evaluation (Signed)
 Occupational Therapy Evaluation Patient Details Name: Lucas Fuller MRN: [id number] DOB: [birth year] Today's Date: 08/01/2023   History of Present Illness Lucas Fuller is an 83 y/o male admitted 07/30/23 after being found on the floor with AMS at home by family. Dx with acalculous cholecystitis, fall, hyperbilirubinemia, elevated troponin, and rhabdomyolysis. PMH includes CVA (2023) thyroid  disease, nonischemic cardiomyopathy, atrial fibrillation, CHF, dementia, bladder cancer, hypothyroidism.   Clinical Impression   Pt admitted for above, he remains confused and needs frequent reorientation for him to understand that he is  currently at [facility]. Pt was able to transfer short distance to recliner with CGA, he has trouble with ADL initiation when prompted and needs [facility] to [facility] for ADLs at this time due to cognition impacting performance. Pt would benefit from continued acute skilled OT services to address deficits and help transition to next level of care. Patient would benefit from post acute skilled rehab facility with <3 hours of therapy and 24/7 support given his level of confusion and impaired ability to process.        If plan is discharge home, recommend the following: Supervision due to cognitive status;Direct supervision/assist for medications management;Direct supervision/assist for financial management;A little help with walking and/or transfers;A little help with bathing/dressing/bathroom;Assistance with cooking/housework;Assist for transportation    Functional Status Assessment  Patient has had a recent decline in their functional status and demonstrates the ability to make significant improvements in function in a reasonable and predictable amount of time.  Equipment Recommendations  None recommended by OT (TBD at next level of care)    Recommendations for Other Services       Precautions / Restrictions Precautions Precautions: Fall Precaution Comments: dementia Restrictions Weight  Bearing Restrictions Per Provider Order: No      Mobility Bed Mobility Overal bed mobility: Needs Assistance Bed Mobility: Sit to Supine, Supine to Sit Rolling: Contact guard assist   Supine to sit: Contact guard, HOB elevated, Used rails Sit to supine: Contact guard assist        Transfers Overall transfer level: Needs assistance Equipment used: 1 person hand held assist Transfers: Sit to/from Stand, Bed to chair/wheelchair/BSC Sit to Stand: Contact guard assist Stand pivot transfers: Contact guard assist                Balance Overall balance assessment: Needs assistance Sitting-balance support: Bilateral upper extremity supported, Feet supported Sitting balance-Leahy Scale: Fair     Standing balance support: Bilateral upper extremity supported, Reliant on assistive device for balance Standing balance-Leahy Scale: Poor                             ADL either performed or assessed with clinical judgement   ADL Overall ADL's : Needs assistance/impaired Eating/Feeding: Independent;Sitting   Grooming: Bed level;Contact guard assist Grooming Details (indicate cue type and reason): cues for initiation Upper Body Bathing: Sitting;Contact guard assist   Lower Body Bathing: Sitting/lateral leans;Minimal assistance   Upper Body Dressing : Sitting;Moderate assistance Upper Body Dressing Details (indicate cue type and reason): decreased ability to initiate and process Lower Body Dressing: Maximal assistance;Sitting/lateral leans Lower Body Dressing Details (indicate cue type and reason): attempts to doff socks but is not able to Toilet Transfer: Stand-pivot;Contact guard assist   Toileting- Clothing Manipulation and Hygiene: Sitting/lateral lean;Contact guard assist               Vision         Perception  Praxis         Pertinent Vitals/Pain Pain Assessment Pain Assessment: No/denies pain     Extremity/Trunk Assessment Upper  Extremity Assessment Upper Extremity Assessment: Generalized weakness   Lower Extremity Assessment Lower Extremity Assessment: Generalized weakness   Cervical / Trunk Assessment Cervical / Trunk Assessment: Kyphotic   Communication Communication Communication: No apparent difficulties Cueing Techniques: Verbal cues   Cognition Arousal: Alert Behavior During Therapy: WFL for tasks assessed/performed Overall Cognitive Status: History of cognitive impairments - at baseline                                 General Comments: dementia. Pt with impaired initiation for ADLs, decreased STM, and oriented to himself only. Poor processing although he tries     General Comments  VSs per monitor    Exercises     Shoulder Instructions      Home Living Family/patient expects to be discharged to:: Private residence Living Arrangements: Alone Available Help at Discharge: Family;Available PRN/intermittently (niece Erminio checks on pt occasionally) Type of Home: House Home Access: Other (comment);Stairs to enter (denied any stairs but unsure of accurracy of info) Entrance Stairs-Number of Steps: 2   Home Layout: One level     Bathroom Shower/Tub: Engineer, Production Accessibility: Yes   Home Equipment: Rollator (4 wheels)   Additional Comments: unsure of accurracy of information and not stated in chart. Need to confirm with niece Erminio. Appears that pt was using rollator prior to fall.      Prior Functioning/Environment Prior Level of Function : Independent/Modified Independent;Patient poor historian/Family not available             Mobility Comments: Pt reports SPC use, unsure of accuracy ADLs Comments: Pt reports being ind. I can't get no help pt reports        OT Problem List: Decreased strength;Impaired balance (sitting and/or standing);Decreased safety awareness;Decreased cognition      OT Treatment/Interventions: Self-care/ADL  training;Therapeutic exercise;Patient/family education;Therapeutic activities;DME and/or AE instruction;Balance training    OT Goals(Current goals can be found in the care plan section) Acute Rehab OT Goals Patient Stated Goal: none stated OT Goal Formulation: With patient Time For Goal Achievement: 08/15/23 Potential to Achieve Goals: Good ADL Goals Pt Will Perform Grooming: with supervision;standing Pt Will Perform Lower Body Bathing: with supervision;with set-up;sit to/from stand Pt Will Perform Lower Body Dressing: with set-up;sit to/from stand Pt Will Transfer to Toilet: with supervision;ambulating Additional ADL Goal #1: Pt will complete Medi cog assessment (if cognition begins improving) to determine capacity to return to independent community living  OT Frequency: Min 1X/week    Co-evaluation              AM-PAC OT 6 Clicks Daily Activity     Outcome Measure Help from another person eating meals?: None Help from another person taking care of personal grooming?: A Little Help from another person toileting, which includes using toliet, bedpan, or urinal?: A Little Help from another person bathing (including washing, rinsing, drying)?: A Little Help from another person to put on and taking off regular upper body clothing?: A Little Help from another person to put on and taking off regular lower body clothing?: A Lot 6 Click Score: 18   End of Session Equipment Utilized During Treatment: Gait belt Nurse Communication: Mobility status  Activity Tolerance: Patient tolerated treatment well Patient left: in chair;with call bell/phone within reach;with chair  alarm set  OT Visit Diagnosis: Unsteadiness on feet (R26.81);Other symptoms and signs involving cognitive function                Time: 9065-9042 OT Time Calculation (min): 23 min Charges:  OT General Charges $OT Visit: 1 Visit OT Evaluation $OT Eval Moderate Complexity: 1 Mod OT Treatments $Therapeutic Activity:  8-22 mins  08/01/2023  AB, OTR/L  Acute Rehabilitation Services  Office: [phone]   Curtistine JONETTA Das 08/01/2023, 11:25 AM

## 2023-08-02 DIAGNOSIS — R188 Other ascites: Secondary | ICD-10-CM | POA: Diagnosis not present

## 2023-08-02 DIAGNOSIS — R932 Abnormal findings on diagnostic imaging of liver and biliary tract: Secondary | ICD-10-CM | POA: Diagnosis not present

## 2023-08-02 DIAGNOSIS — D696 Thrombocytopenia, unspecified: Secondary | ICD-10-CM | POA: Diagnosis not present

## 2023-08-02 DIAGNOSIS — F03C0 Unspecified dementia, severe, without behavioral disturbance, psychotic disturbance, mood disturbance, and anxiety: Secondary | ICD-10-CM | POA: Diagnosis present

## 2023-08-02 DIAGNOSIS — R339 Retention of urine, unspecified: Secondary | ICD-10-CM | POA: Diagnosis not present

## 2023-08-02 DIAGNOSIS — L89153 Pressure ulcer of sacral region, stage 3: Secondary | ICD-10-CM | POA: Diagnosis not present

## 2023-08-02 DIAGNOSIS — Z1152 Encounter for screening for COVID-19: Secondary | ICD-10-CM | POA: Diagnosis not present

## 2023-08-02 DIAGNOSIS — I110 Hypertensive heart disease with heart failure: Secondary | ICD-10-CM | POA: Diagnosis not present

## 2023-08-02 DIAGNOSIS — I5043 Acute on chronic combined systolic (congestive) and diastolic (congestive) heart failure: Secondary | ICD-10-CM | POA: Diagnosis not present

## 2023-08-02 DIAGNOSIS — R791 Abnormal coagulation profile: Secondary | ICD-10-CM | POA: Diagnosis not present

## 2023-08-02 DIAGNOSIS — R918 Other nonspecific abnormal finding of lung field: Secondary | ICD-10-CM | POA: Diagnosis not present

## 2023-08-02 DIAGNOSIS — E039 Hypothyroidism, unspecified: Secondary | ICD-10-CM | POA: Diagnosis not present

## 2023-08-02 DIAGNOSIS — I5020 Unspecified systolic (congestive) heart failure: Secondary | ICD-10-CM | POA: Diagnosis not present

## 2023-08-02 DIAGNOSIS — I428 Other cardiomyopathies: Secondary | ICD-10-CM | POA: Diagnosis not present

## 2023-08-02 DIAGNOSIS — A419 Sepsis, unspecified organism: Secondary | ICD-10-CM | POA: Diagnosis not present

## 2023-08-02 DIAGNOSIS — J90 Pleural effusion, not elsewhere classified: Secondary | ICD-10-CM | POA: Diagnosis not present

## 2023-08-02 DIAGNOSIS — R6889 Other general symptoms and signs: Secondary | ICD-10-CM | POA: Diagnosis not present

## 2023-08-02 DIAGNOSIS — S0990XA Unspecified injury of head, initial encounter: Secondary | ICD-10-CM | POA: Diagnosis not present

## 2023-08-02 DIAGNOSIS — K7200 Acute and subacute hepatic failure without coma: Secondary | ICD-10-CM | POA: Diagnosis not present

## 2023-08-02 DIAGNOSIS — Z66 Do not resuscitate: Secondary | ICD-10-CM | POA: Diagnosis not present

## 2023-08-02 DIAGNOSIS — M549 Dorsalgia, unspecified: Secondary | ICD-10-CM | POA: Diagnosis not present

## 2023-08-02 DIAGNOSIS — R519 Headache, unspecified: Secondary | ICD-10-CM | POA: Diagnosis not present

## 2023-08-02 DIAGNOSIS — R638 Other symptoms and signs concerning food and fluid intake: Secondary | ICD-10-CM | POA: Diagnosis not present

## 2023-08-02 DIAGNOSIS — Z7401 Bed confinement status: Secondary | ICD-10-CM | POA: Diagnosis not present

## 2023-08-02 DIAGNOSIS — I5041 Acute combined systolic (congestive) and diastolic (congestive) heart failure: Secondary | ICD-10-CM | POA: Diagnosis not present

## 2023-08-02 DIAGNOSIS — I77810 Thoracic aortic ectasia: Secondary | ICD-10-CM | POA: Diagnosis not present

## 2023-08-02 DIAGNOSIS — N179 Acute kidney failure, unspecified: Secondary | ICD-10-CM | POA: Diagnosis not present

## 2023-08-02 DIAGNOSIS — R531 Weakness: Secondary | ICD-10-CM | POA: Diagnosis not present

## 2023-08-02 DIAGNOSIS — K819 Cholecystitis, unspecified: Secondary | ICD-10-CM | POA: Diagnosis not present

## 2023-08-02 DIAGNOSIS — R680 Hypothermia, not associated with low environmental temperature: Secondary | ICD-10-CM | POA: Diagnosis not present

## 2023-08-02 DIAGNOSIS — R748 Abnormal levels of other serum enzymes: Secondary | ICD-10-CM | POA: Diagnosis not present

## 2023-08-02 DIAGNOSIS — I4891 Unspecified atrial fibrillation: Secondary | ICD-10-CM | POA: Diagnosis not present

## 2023-08-02 DIAGNOSIS — E872 Acidosis, unspecified: Secondary | ICD-10-CM | POA: Diagnosis not present

## 2023-08-02 DIAGNOSIS — E871 Hypo-osmolality and hyponatremia: Secondary | ICD-10-CM | POA: Diagnosis not present

## 2023-08-02 DIAGNOSIS — J9811 Atelectasis: Secondary | ICD-10-CM | POA: Diagnosis not present

## 2023-08-02 DIAGNOSIS — S3993XA Unspecified injury of pelvis, initial encounter: Secondary | ICD-10-CM | POA: Diagnosis not present

## 2023-08-02 DIAGNOSIS — R7989 Other specified abnormal findings of blood chemistry: Secondary | ICD-10-CM | POA: Diagnosis not present

## 2023-08-02 DIAGNOSIS — E875 Hyperkalemia: Secondary | ICD-10-CM | POA: Diagnosis not present

## 2023-08-02 DIAGNOSIS — F0390 Unspecified dementia without behavioral disturbance: Secondary | ICD-10-CM | POA: Diagnosis not present

## 2023-08-02 DIAGNOSIS — Z515 Encounter for palliative care: Secondary | ICD-10-CM | POA: Diagnosis not present

## 2023-08-02 DIAGNOSIS — Z23 Encounter for immunization: Secondary | ICD-10-CM | POA: Diagnosis not present

## 2023-08-02 DIAGNOSIS — J9601 Acute respiratory failure with hypoxia: Secondary | ICD-10-CM | POA: Diagnosis not present

## 2023-08-02 DIAGNOSIS — T68XXXA Hypothermia, initial encounter: Secondary | ICD-10-CM | POA: Diagnosis not present

## 2023-08-02 DIAGNOSIS — J449 Chronic obstructive pulmonary disease, unspecified: Secondary | ICD-10-CM | POA: Diagnosis not present

## 2023-08-02 DIAGNOSIS — I480 Paroxysmal atrial fibrillation: Secondary | ICD-10-CM | POA: Diagnosis not present

## 2023-08-02 DIAGNOSIS — Z8551 Personal history of malignant neoplasm of bladder: Secondary | ICD-10-CM | POA: Diagnosis not present

## 2023-08-02 DIAGNOSIS — Z7901 Long term (current) use of anticoagulants: Secondary | ICD-10-CM | POA: Diagnosis not present

## 2023-08-02 DIAGNOSIS — R6521 Severe sepsis with septic shock: Secondary | ICD-10-CM | POA: Diagnosis not present

## 2023-08-02 DIAGNOSIS — S299XXA Unspecified injury of thorax, initial encounter: Secondary | ICD-10-CM | POA: Diagnosis not present

## 2023-08-02 DIAGNOSIS — Z8673 Personal history of transient ischemic attack (TIA), and cerebral infarction without residual deficits: Secondary | ICD-10-CM | POA: Diagnosis not present

## 2023-08-02 DIAGNOSIS — I482 Chronic atrial fibrillation, unspecified: Secondary | ICD-10-CM | POA: Diagnosis not present

## 2023-08-02 DIAGNOSIS — E8809 Other disorders of plasma-protein metabolism, not elsewhere classified: Secondary | ICD-10-CM | POA: Diagnosis not present

## 2023-08-02 DIAGNOSIS — L89152 Pressure ulcer of sacral region, stage 2: Secondary | ICD-10-CM | POA: Diagnosis not present

## 2023-08-02 DIAGNOSIS — Z8616 Personal history of COVID-19: Secondary | ICD-10-CM | POA: Diagnosis not present

## 2023-08-02 DIAGNOSIS — Y92009 Unspecified place in unspecified non-institutional (private) residence as the place of occurrence of the external cause: Secondary | ICD-10-CM | POA: Diagnosis not present

## 2023-08-02 DIAGNOSIS — D689 Coagulation defect, unspecified: Secondary | ICD-10-CM | POA: Diagnosis not present

## 2023-08-02 DIAGNOSIS — Z9181 History of falling: Secondary | ICD-10-CM | POA: Diagnosis not present

## 2023-08-02 DIAGNOSIS — T796XXA Traumatic ischemia of muscle, initial encounter: Secondary | ICD-10-CM | POA: Diagnosis not present

## 2023-08-02 DIAGNOSIS — D649 Anemia, unspecified: Secondary | ICD-10-CM | POA: Diagnosis not present

## 2023-08-02 DIAGNOSIS — G9341 Metabolic encephalopathy: Secondary | ICD-10-CM | POA: Diagnosis not present

## 2023-08-02 DIAGNOSIS — M5080 Other cervical disc disorders, unspecified cervical region: Secondary | ICD-10-CM | POA: Diagnosis not present

## 2023-08-02 DIAGNOSIS — R9082 White matter disease, unspecified: Secondary | ICD-10-CM | POA: Diagnosis not present

## 2023-08-02 DIAGNOSIS — R609 Edema, unspecified: Secondary | ICD-10-CM | POA: Diagnosis not present

## 2023-08-02 DIAGNOSIS — M47812 Spondylosis without myelopathy or radiculopathy, cervical region: Secondary | ICD-10-CM | POA: Diagnosis not present

## 2023-08-02 DIAGNOSIS — M6281 Muscle weakness (generalized): Secondary | ICD-10-CM | POA: Diagnosis not present

## 2023-08-02 DIAGNOSIS — R627 Adult failure to thrive: Secondary | ICD-10-CM | POA: Diagnosis not present

## 2023-08-02 DIAGNOSIS — I10 Essential (primary) hypertension: Secondary | ICD-10-CM | POA: Diagnosis not present

## 2023-08-02 DIAGNOSIS — E860 Dehydration: Secondary | ICD-10-CM | POA: Diagnosis not present

## 2023-08-02 DIAGNOSIS — Y92129 Unspecified place in nursing home as the place of occurrence of the external cause: Secondary | ICD-10-CM | POA: Diagnosis not present

## 2023-08-02 DIAGNOSIS — L89313 Pressure ulcer of right buttock, stage 3: Secondary | ICD-10-CM | POA: Diagnosis not present

## 2023-08-02 DIAGNOSIS — R404 Transient alteration of awareness: Secondary | ICD-10-CM | POA: Diagnosis not present

## 2023-08-02 DIAGNOSIS — R17 Unspecified jaundice: Secondary | ICD-10-CM | POA: Diagnosis not present

## 2023-08-02 DIAGNOSIS — I700 Atherosclerosis of aorta: Secondary | ICD-10-CM | POA: Diagnosis not present

## 2023-08-02 DIAGNOSIS — W19XXXA Unspecified fall, initial encounter: Secondary | ICD-10-CM | POA: Diagnosis not present

## 2023-08-02 DIAGNOSIS — G9389 Other specified disorders of brain: Secondary | ICD-10-CM | POA: Diagnosis not present

## 2023-08-02 DIAGNOSIS — R570 Cardiogenic shock: Secondary | ICD-10-CM | POA: Diagnosis not present

## 2023-08-02 DIAGNOSIS — I4819 Other persistent atrial fibrillation: Secondary | ICD-10-CM | POA: Diagnosis not present

## 2023-08-02 DIAGNOSIS — I5023 Acute on chronic systolic (congestive) heart failure: Secondary | ICD-10-CM | POA: Diagnosis not present

## 2023-08-02 DIAGNOSIS — M6282 Rhabdomyolysis: Secondary | ICD-10-CM | POA: Diagnosis not present

## 2023-08-02 LAB — BASIC METABOLIC PANEL
Anion gap: 15 (ref 5–15)
BUN: 23 mg/dL (ref 8–23)
CO2: 18 mmol/L — ABNORMAL LOW (ref 22–32)
Calcium: 8 mg/dL — ABNORMAL LOW (ref 8.9–10.3)
Chloride: 100 mmol/L (ref 98–111)
Creatinine, Ser: 1.05 mg/dL (ref 0.61–1.24)
GFR, Estimated: >60 mL/min (ref >60)
Glucose, Bld: 105 mg/dL — ABNORMAL HIGH (ref 70–99)
Potassium: 4.8 mmol/L (ref 3.5–5.1)
Sodium: 133 mmol/L — ABNORMAL LOW (ref 135–145)

## 2023-08-02 LAB — CK: Total CK: 662 U/L — ABNORMAL HIGH (ref 49–397)

## 2023-08-02 MED ORDER — MIDODRINE HCL 5 MG PO TABS
5.0000 mg | ORAL_TABLET | Freq: Three times a day (TID) | ORAL | Status: DC
  Administered 2023-08-02 (×2): 5 mg via ORAL
  Filled 2023-08-02 (×2): qty 1

## 2023-08-02 MED ORDER — FUROSEMIDE 40 MG PO TABS: 20.0000 mg | ORAL_TABLET | Freq: Every day | ORAL | Status: DC

## 2023-08-02 MED ORDER — MIDODRINE HCL 5 MG PO TABS: 5.0000 mg | ORAL_TABLET | Freq: Three times a day (TID) | ORAL | Status: DC

## 2023-08-02 MED ORDER — AZITHROMYCIN 500 MG PO TABS
500.0000 mg | ORAL_TABLET | Freq: Every day | ORAL | Status: DC
  Administered 2023-08-02: 500 mg via ORAL
  Filled 2023-08-02: qty 1

## 2023-08-02 MED ORDER — MIDODRINE HCL 5 MG PO TABS
10.0000 mg | ORAL_TABLET | ORAL | Status: AC
  Administered 2023-08-02: 10 mg via ORAL
  Filled 2023-08-02: qty 2

## 2023-08-02 MED ORDER — AZITHROMYCIN 500 MG PO TABS: 500.0000 mg | ORAL_TABLET | Freq: Every day | ORAL | Status: AC

## 2023-08-02 NOTE — Progress Notes (Signed)
   08/02/23 1044  Mobility  Activity Transferred from bed to chair  Level of Assistance Minimal assist, patient does 75% or more  Assistive Device Front wheel walker  Activity Response Tolerated well  Mobility Referral Yes  Mobility visit 1 Mobility  Mobility Specialist Start Time (ACUTE ONLY) 1011  Mobility Specialist Stop Time (ACUTE ONLY) 1044  Mobility Specialist Time Calculation (min) (ACUTE ONLY) 33 min   Mobility Specialist: Progress Note  Post-Mobility: HR 70  Pt agreeable to mobility session - received in bed. Required MinA using RW. Pt was asymptomatic throughout session with no complaints, VSS. SPO2 was not reading properly on the Cardiac Monitor or pulse ox, RN notifed via Securechat. Pt did not appear and denied SOB.  Returned to chair with all needs met - call bell within reach. Chair alarm on.   Lucas Fuller, BS Mobility Specialist Please contact via SecureChat or Rehab office at [phone].

## 2023-08-02 NOTE — TOC Progression Note (Addendum)
 Transition of Care [facility]) - Progression Note    Patient Details  Name: Lucas Fuller MRN: [id number] Date of Birth: [birth year]  [facility] CM/SW Contact  Inocente GORMAN Kindle, LCSW Phone Number: 08/02/2023, 9:13 AM  Clinical Narrative:    9am-CSW spoke with patient's niece and discussed that per MD, patient is not quite at the hospice facility point and recommends discharging to rehab at [facility]. She reported understanding. CSW provided SNF bed offers and she requested 1-[facility] as patient requested to go there before he was this sick and 2-[facility] as it is close to her. CSW awaiting response from [facility] on bed availability and will start insurance process.    11:28 AM-Per Kia with [facility], they should have a bed for patient today. CSW initiated insurance authorization and received approval, Ref# [id number], effective 08/02/2023-08/06/2023.  Expected Discharge Plan: Skilled Nursing Facility Barriers to Discharge: Insurance Authorization, SNF Pending bed offer  Expected Discharge Plan and Services In-house Referral: Clinical Social Work, Hospice / Palliative Care   Post Acute Care Choice: Hospice Living arrangements for the past 2 months: Single Family Home                                       Social Determinants of Health (SDOH) Interventions SDOH Screenings   Housing: Low Risk  (11/10/2021)  Transportation Needs: Patient Unable To Answer (08/01/2023)  Alcohol  Screen: Low Risk  (11/10/2021)  Tobacco Use: Low Risk  (07/31/2023)    Readmission Risk Interventions     No data to display

## 2023-08-02 NOTE — TOC Transition Note (Signed)
 Transition of Care [facility]) - Discharge Note   Patient Details  Name: Lucas Fuller MRN: [id number] Date of Birth: 1941-02-05  Transition of Care [facility]) CM/SW Contact:  Inocente GORMAN Kindle, LCSW Phone Number: 08/02/2023, 1:20 PM   Clinical Narrative:    Patient will DC to: [facility] Anticipated DC date: 08/02/23 Family notified: Niece, Scientist, Product/process Development by: ROME   Per MD patient ready for DC to [facility]. RN to call report prior to discharge [phone] room 122b). RN, patient, patient's family, and facility notified of DC. Discharge Summary and FL2 sent to facility. DC packet on chart including signed DNR. Ambulance transport requested for patient.   CSW will sign off for now as social work intervention is no longer needed. Please consult us  again if new needs arise.     Final next level of care: Skilled Nursing Facility Barriers to Discharge: Barriers Resolved   Patient Goals and CMS Choice Patient states their goals for this hospitalization and ongoing recovery are:: Comfort CMS Medicare.gov Compare Post Acute Care list provided to:: [name] Choice offered to / list presented to :  (Niece) [place] ownership interest in [facility].provided to::  (Niece)    Discharge Placement   Existing PASRR number confirmed : 08/02/23          Patient chooses bed at:  ([facility]) Patient to be transferred to facility by: [facility] Name of family member notified: Niece Patient and family notified of of transfer: 08/02/23  Discharge Plan and Services Additional resources added to the After Visit Summary for   In-house Referral: Clinical Social Work, Hospice / Palliative Care   Post Acute Care Choice: Hospice                               Social Drivers of Health (SDOH) Interventions SDOH Screenings   Housing: Low Risk  (11/10/2021)  Transportation Needs: Patient Unable To Answer (08/01/2023)  Alcohol  Screen: Low Risk   (11/10/2021)  Tobacco Use: Low Risk  (07/31/2023)     Readmission Risk Interventions     No data to display

## 2023-08-02 NOTE — Discharge Summary (Addendum)
 PATIENT DETAILS Name: Lucas Fuller Age: 83 y.o. Sex: male Date of Birth: [birth year] MRN: [id number]. Admitting Physician: Subrina Sundil, MD ERE:Yldjpw, Ardell, MD  Admit Date: 07/30/2023 Discharge date: 08/02/2023  Recommendations for Outpatient Follow-up:  Follow up with PCP in 1-2 weeks Please obtain CMP/CBC in one week Ensure palliative care/hospice follow-up at SNF  Admitted From:  Home  Disposition: Skilled nursing facility   Discharge Condition: fair  CODE STATUS:   Code Status: Limited: Do not attempt resuscitation (DNR) -DNR-LIMITED -Do Not Intubate/DNI    Diet recommendation:  Diet Order             Diet - low sodium heart healthy           Diet Heart Room service appropriate? Yes; Fluid consistency: Thin; Fluid restriction: 2000 mL Fluid  Diet effective now                    Brief Summary: Patient is a 83 y.o.  male with history of chronic HFrEF, PAF, HTN, HLD, prior CVA, bladder cancer-presented to ED after being found in his home on the floor.   Significant events: 1/7>> admit to [facility]   Significant studies: 1/7>> CT head: No acute findings 1/7>> RUQ ultrasound: Sludge with increased wall rella Chancy sign 1/8>> CT angio chest: No PE-groundglass opacity/intralobular septal thickening-pulm edema versus atypical pneumonia 1/8>> CT abdomen/pelvis: Anasarca-bladder wall thickening 1/8>> HIDA scan: Negative 1/8>> echo: EF 20-25%, RV systolic function is moderately reduced.  Mild to moderate MR, moderate TR   Significant microbiology data: 1/8>> blood culture: No growth   Procedures: None   Consults: Cardiology  Brief Hospital Course: Mechanical fall versus syncope Given dementia-unclear whether this was a mechanical fall or a cardiogenic syncope (significant decrease in EF on echo) Managed with supportive care Not a candidate for advanced therapies including ICD per cardiology which I agree with. Please ensure hospice/palliative  care follow-up at SNF.   Rhabdomyolysis Likely secondary to being down on the floor for a while CK levels have downtrended with supportive care   Paroxysmal A-fib RVR RVR on initial presentation-currently rate controlled Coreg /Eliquis    Chronic HFrEF Biventricular heart failure Euvolemic Frail-advanced age-appears to have some cognitive issues-agree with cardiology-not a candidate for aggressive care Continue Coreg  Volume status stable on diuretics On low-dose midodrine  for BP support to allow room for Coreg /diuretics.   HTN Controlled with Coreg  Lisinopril  remains on hold-as BP remains soft.  ?  Atypical pneumonia seen on CT imaging Some cough-but afebrile-nontoxic-appearing Continue Zithromax  for a few days.  Rocephin  discontinued on discharge.   Cholelithiasis No clinical findings of cholecystitis-furthermore HIDA scan negative Do not think patient is a candidate for cholecystectomy at this point.   History of CVA Continue Eliquis  Statin on hold due to elevated CK-but will be resumed on d/c given improvement in CK levels   Hypothyroidism TSH minimally elevated Resume Synthroid . Recheck surgeon 4 to 6 weeks.  History bladder cancer    Dementia Mildly confused-easily redirected today Maintain delirium precautions   Palliative care Long discussion with niece-[name] Smith-1/9-tenuous situation explain-possibility that he could have syncopized from V. tach  which could have resulted in a fall.  He is very frail-elderly-has dementia-and per cardiology he is not a candidate for aggressive care.  After extensive discussion-he is a DNR-palliative care consultation advised but this can be done at [facility].   BMI: Estimated body mass index is 22.86 kg/m as calculated from the following:   Height as of this encounter:  5' 7 (1.702 m).   Weight as of this encounter: 66.2 kg.   Discharge Diagnoses:  Principal Problem:   Acalculous cholecystitis Active Problems:   Acute  metabolic encephalopathy   Elevated bilirubin   Fall at home, initial encounter   Rhabdomyolysis   Hypothyroidism   Combined 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)   Paroxysmal atrial fibrillation (HCC)   Acute HFrEF (heart failure with reduced ejection fraction) (HCC)   Essential hypertension   History of CVA (cerebrovascular accident)   History of bladder cancer   Elevated troponin   Atrial fibrillation with rapid ventricular response (HCC)   Persistent atrial fibrillation (HCC)   Current use of long term anticoagulation   Elevated troponin I level   Nonischemic cardiomyopathy [facility])   Discharge Instructions:  Activity:  As tolerated with Full fall precautions use walker/cane & assistance as needed   Discharge Instructions     (HEART FAILURE PATIENTS) Call MD:  Anytime you have any of the following symptoms: 1) 3 pound weight gain in 24 hours or 5 pounds in 1 week 2) shortness of breath, with or without a dry hacking cough 3) swelling in the hands, feet or stomach 4) if you have to sleep on extra pillows at night in order to breathe.   Complete by: As directed    Call MD for:  difficulty breathing, headache or visual disturbances   Complete by: As directed    Diet - low sodium heart healthy   Complete by: As directed    Discharge instructions   Complete by: As directed    Follow with Primary MD  Ransom Other, MD in 1-2 weeks  Please get a complete blood count and chemistry panel checked by your Primary MD at your next visit, and again as instructed by your Primary MD.  Get Medicines reviewed and adjusted: Please take all your medications with you for your next visit with your Primary MD  Laboratory/radiological data: Please request your Primary MD to go over all hospital tests and procedure/radiological results at the follow up, please ask your Primary MD to get all Hospital records sent to his/her office.  In some cases, they will be blood work,  cultures and biopsy results pending at the time of your discharge. Please request that your primary care M.D. follows up on these results.  Also Note the following: If you experience worsening of your admission symptoms, develop shortness of breath, life threatening emergency, suicidal or homicidal thoughts you must seek medical attention immediately by calling 911 or calling your MD immediately  if symptoms less severe.  You must read complete instructions/literature along with all the possible adverse reactions/side effects for all the Medicines you take and that have been prescribed to you. Take any new Medicines after you have completely understood and accpet all the possible adverse reactions/side effects.   Do not drive when taking Pain medications or sleeping medications (Benzodaizepines)  Do not take more than prescribed Pain, Sleep and Anxiety Medications. It is not advisable to combine anxiety,sleep and pain medications without talking with your primary care practitioner  Special Instructions: If you have smoked or chewed Tobacco  in the last 2 yrs please stop smoking, stop any regular Alcohol   and or any Recreational drug use.  Wear Seat belts while driving.  Please note: You were cared for by a hospitalist during your hospital stay. Once you are discharged, your primary care physician will handle any further medical issues. Please note that NO REFILLS for  any discharge medications will be authorized once you are discharged, as it is imperative that you return to your primary care physician (or establish a relationship with a primary care physician if you do not have one) for your post hospital discharge needs so that they can reassess your need for medications and monitor your lab values.   Increase activity slowly   Complete by: As directed       Allergies as of 08/02/2023       Reactions   Dorethia Rho ] Hives   Pt can tolerate 81 mg with no issues        Medication List      STOP taking these medications    Jardiance  10 MG Tabs tablet Generic drug: empagliflozin    lisinopril  2.5 MG tablet Commonly known as: ZESTRIL        TAKE these medications    acetaminophen  500 MG tablet Commonly known as: TYLENOL  Take 1,000 mg by mouth every 6 (six) hours as needed for moderate pain or mild pain.   albuterol  108 (90 Base) MCG/ACT inhaler Commonly known as: VENTOLIN  HFA Inhale 2 puffs into the lungs every 4 (four) hours as needed for wheezing or shortness of breath.   apixaban  5 MG Tabs tablet Commonly known as: ELIQUIS  Take 1 tablet (5 mg total) by mouth 2 (two) times daily.   atorvastatin  20 MG tablet Commonly known as: LIPITOR Take 1 tablet (20 mg total) by mouth daily.   azithromycin  500 MG tablet Commonly known as: ZITHROMAX  Take 1 tablet (500 mg total) by mouth daily for 2 days. Start taking on: August 03, 2023   carvedilol  3.125 MG tablet Commonly known as: COREG  Take 1 tablet by mouth in the morning and at bedtime.   furosemide  40 MG tablet Commonly known as: LASIX  Take 0.5 tablets (20 mg total) by mouth daily. What changed: how much to take   levothyroxine  50 MCG tablet Commonly known as: SYNTHROID  Take 50 mcg by mouth daily before breakfast.   midodrine  5 MG tablet Commonly known as: PROAMATINE  Take 1 tablet (5 mg total) by mouth 3 (three) times daily with meals.   oxybutynin 5 MG 24 hr tablet Commonly known as: DITROPAN-XL Take 5 mg by mouth daily.   potassium chloride  SA 20 MEQ tablet Commonly known as: KLOR-CON  M Take 20 mEq by mouth daily.        Contact information for follow-up providers     Aniceto Daphne CROME, NP Follow up.   Specialty: Cardiology Why: 08/09/2023 at 0900 Contact information: [address] [place] [address] [phone]         Ransom Other, MD. Schedule an appointment as soon as possible for a visit in 1 week(s).   Specialty: Internal Medicine Contact information: [address] [place] [address] [phone]              Contact information for after-discharge care     Destination     [facility] Preferred SNF .   Service: Skilled Nursing Contact information: [address] [place]  [address] [phone]                    Allergies  Allergen Reactions   Dorethia Hoh ] Hives    Pt can tolerate 81 mg with no issues     Other Procedures/Studies: ECHOCARDIOGRAM COMPLETE Result Date: 07/31/2023    ECHOCARDIOGRAM REPORT   Patient Name:   Lucas Fuller Date of Exam:  07/31/2023 Medical Rec #:  [id number]      Height:       67.0 in Accession #:    [id number]     Weight:       135.0 lb Date of Birth:  Jan 03, 1941      BSA:          1.711 m Patient Age:    82 years       BP:           103/73 mmHg Patient Gender: M              HR:           73 bpm. Exam Location:  Inpatient Procedure: 2D Echo, Cardiac Doppler, Color Doppler and Intracardiac            Opacification Agent Indications:    Elevated Troponin  History:        Patient has prior history of Echocardiogram examinations, most                 recent 11/09/2021. Cardiomyopathy and CHF, Migraine,                 Arrythmias:Atrial Fibrillation and Atrial Flutter,                 Signs/Symptoms:Shortness of Breath; Risk Factors:Hypertension.  Sonographer:    Thea Norlander RCS Referring Phys: [phone] SUBRINA SUNDIL IMPRESSIONS  1. Left ventricular ejection fraction, by estimation, is 20 to 25%. The left ventricle has severely decreased function. The left ventricle demonstrates global hypokinesis. The left ventricular internal cavity size was mildly dilated. Left ventricular diastolic parameters are indeterminate.  2. Right ventricular systolic function is moderately reduced. The right ventricular size is normal. There is mildly elevated pulmonary artery systolic pressure. The estimated right ventricular systolic pressure is 42.7 mmHg.  3. Left atrial size  was severely dilated.  4. Right atrial size was mildly dilated.  5. Mild to moderate mitral valve regurgitation.  6. Tricuspid valve regurgitation is moderate.  7. There is moderate calcification of the aortic valve. Aortic valve regurgitation is not visualized.  8. The inferior vena cava is dilated in size with <50% respiratory variability, suggesting right atrial pressure of 15 mmHg. FINDINGS  Left Ventricle: Left ventricular ejection fraction, by estimation, is 20 to 25%. The left ventricle has severely decreased function. The left ventricle demonstrates global hypokinesis. Definity  contrast agent was given IV to delineate the left ventricular endocardial borders. The left ventricular internal cavity size was mildly dilated. There is no left ventricular hypertrophy. Left ventricular diastolic parameters are indeterminate. Right Ventricle: The right ventricular size is normal. Right ventricular systolic function is moderately reduced. There is mildly elevated pulmonary artery systolic pressure. The tricuspid regurgitant velocity is 2.63 m/s, and with an assumed right atrial pressure of 15 mmHg, the estimated right ventricular systolic pressure is 42.7 mmHg. Left Atrium: Left atrial size was severely dilated. Right Atrium: Right atrial size was mildly dilated. Pericardium: There is no evidence of pericardial effusion. Mitral Valve: Mild to moderate mitral valve regurgitation. Tricuspid Valve: Tricuspid valve regurgitation is moderate. Aortic Valve: There is moderate calcification of the aortic valve. Aortic valve regurgitation is not visualized. Aortic valve mean gradient measures 7.0 mmHg. Aortic valve peak gradient measures 13.2 mmHg. Aortic valve area, by VTI measures 1.06 cm. Pulmonic Valve: Pulmonic valve regurgitation is trivial. Aorta: The aortic root and ascending aorta are structurally normal, with no evidence of dilitation. Venous: The inferior vena cava is dilated  in size with less than 50% respiratory  variability, suggesting right atrial pressure of 15 mmHg. IAS/Shunts: No atrial level shunt detected by color flow Doppler.  LEFT VENTRICLE PLAX 2D LVIDd:         5.10 cm   Diastology LVIDs:         4.80 cm   LV e' medial:    4.26 cm/s LV PW:         0.80 cm   LV E/e' medial:  26.3 LV IVS:        0.80 cm   LV e' lateral:   7.42 cm/s LVOT diam:     2.20 cm   LV E/e' lateral: 15.1 LV SV:         39 LV SV Index:   23 LVOT Area:     3.80 cm  RIGHT VENTRICLE            IVC RV S prime:     4.81 cm/s  IVC diam: 2.40 cm LEFT ATRIUM              Index        RIGHT ATRIUM           Index LA diam:        5.00 cm  2.92 cm/m   RA Area:     17.90 cm LA Vol (A2C):   80.4 ml  46.99 ml/m  RA Volume:   43.00 ml  25.13 ml/m LA Vol (A4C):   124.0 ml 72.47 ml/m LA Biplane Vol: 105.0 ml 61.36 ml/m  AORTIC VALVE                     PULMONIC VALVE AV Area (Vmax):    1.14 cm      PR End Diast Vel: 3.88 msec AV Area (Vmean):   1.09 cm AV Area (VTI):     1.06 cm AV Vmax:           182.00 cm/s AV Vmean:          120.000 cm/s AV VTI:            0.366 m AV Peak Grad:      13.2 mmHg AV Mean Grad:      7.0 mmHg LVOT Vmax:         54.53 cm/s LVOT Vmean:        34.433 cm/s LVOT VTI:          0.102 m LVOT/AV VTI ratio: 0.28  AORTA Ao Root diam: 3.70 cm Ao Asc diam:  3.60 cm MITRAL VALVE                TRICUSPID VALVE MV Area (PHT): 6.48 cm     TR Peak grad:   27.7 mmHg MV Decel Time: 117 msec     TR Vmax:        263.00 cm/s MR Peak grad: 38.3 mmHg MR Vmax:      309.50 cm/s   SHUNTS MV E velocity: 112.00 cm/s  Systemic VTI:  0.10 m                             Systemic Diam: 2.20 cm Ronal Ross Electronically signed by Ronal Ross Signature Date/Time: 07/31/2023/5:26:43 PM    Final    NM Hepatobiliary Liver Func Result Date: 07/31/2023 CLINICAL DATA:  Right upper quadrant pain. Gallbladder sludge on recent ultrasound. EXAM: NUCLEAR MEDICINE HEPATOBILIARY  IMAGING TECHNIQUE: Sequential images of the abdomen were obtained out to 60 minutes  following intravenous administration of radiopharmaceutical. Due to lack of gallbladder activity at 60 minutes, imaging was continued for another 60 minutes following administration of 2.5 mg IV morphine . RADIOPHARMACEUTICALS:  7.8 mCi Tc-30m  Choletec  IV COMPARISON:  None Available. FINDINGS: Prompt uptake and biliary excretion of activity by the liver is seen. Biliary activity passes into small bowel, consistent with patent common bile duct. No gallbladder activity was seen at 60 minutes, however, gallbladder activity is present following morphine  administration. IMPRESSION: No evidence of obstruction of cystic duct or common bile duct. Electronically Signed   By: Norleen DELENA Kil M.D.   On: 07/31/2023 14:31   CT Angio Chest PE W and/or Wo Contrast Result Date: 07/31/2023 CLINICAL DATA:  Patient found down by family. Altered mental status since 12/31. Unsteady on feet. Blunt abdominal trauma. Abdominal pain. EXAM: CT ANGIOGRAPHY CHEST CT ABDOMEN AND PELVIS WITH CONTRAST TECHNIQUE: Multidetector CT imaging of the chest was performed using the standard protocol during bolus administration of intravenous contrast. Multiplanar CT image reconstructions and MIPs were obtained to evaluate the vascular anatomy. Multidetector CT imaging of the abdomen and pelvis was performed using the standard protocol during bolus administration of intravenous contrast. RADIATION DOSE REDUCTION: This exam was performed according to the departmental dose-optimization program which includes automated exposure control, adjustment of the mA and/or kV according to patient size and/or use of iterative reconstruction technique. CONTRAST:  75mL OMNIPAQUE  IOHEXOL  350 MG/ML SOLN COMPARISON:  Ultrasound 07/30/2023; chest radiograph 07/30/2023; CTA chest 11/08/2021; CT abdomen and pelvis 09/21/2015 FINDINGS: CTA CHEST FINDINGS Cardiovascular: Cardiomegaly. No pericardial effusion. Reflux of contrast into the IVC and hepatic veins compatible with  elevated right heart pressures. Negative for acute pulmonary embolism. Coronary artery and aortic atherosclerotic calcification. Mediastinum/Nodes: Trachea and esophagus are unremarkable. No thoracic adenopathy. Lungs/Pleura: Patchy bilateral ground-glass opacities with interlobular septal thickening. Diffuse bronchial wall thickening. Trace bilateral pleural effusions. No pneumothorax. Musculoskeletal: No acute fracture. Review of the MIP images confirms the above findings. CT ABDOMEN and PELVIS FINDINGS Hepatobiliary: Respiratory motion limits assessment of the upper abdomen. Questionable nodular hepatic contour which can be seen with cirrhosis. No definite gallbladder abnormality though motion limits assessment of the gallbladder wall. No definite biliary dilation. Pancreas: Unremarkable. Spleen: Unremarkable. Adrenals/Urinary Tract: Stable adrenal glands. Cortical geographic hypoattenuation in the kidneys could be due to chronic scarring or pyelonephritis. No urinary calculi or hydronephrosis. Bladder wall thickening with perivesical stranding. Stomach/Bowel: Normal caliber large and small bowel. Stool ball in the rectum. Stomach and appendix are within normal limits. Vascular/Lymphatic: Aortic atherosclerosis. No enlarged abdominal or pelvic lymph nodes. Reproductive: Unremarkable. Other: Mesenteric and body wall edema. Small volume abdominopelvic ascites. No free intraperitoneal air. Musculoskeletal: No acute fracture. Review of the MIP images confirms the above findings. IMPRESSION: 1. Negative for acute pulmonary embolism. 2. Cardiomegaly with elevated right heart pressures. 3. Ground-glass opacities and interlobular septal thickening with trace bilateral pleural effusions. Prominent differential considerations are pulmonary edema and atypical pneumonia. 4. Anasarca with small volume abdominopelvic ascites. 5. Bladder wall thickening with perivesical stranding. Correlate with urinalysis to exclude cystitis.  6. Cortical geographic hypoattenuation in the kidneys could be due to chronic scarring or pyelonephritis. 7. Questionable nodular hepatic contour which can be seen with cirrhosis. Aortic Atherosclerosis (ICD10-I70.0). Electronically Signed   By: Norman Gatlin M.D.   On: 07/31/2023 02:04   CT ABDOMEN PELVIS W CONTRAST Result Date: 07/31/2023 CLINICAL DATA:  Patient found down by family.  Altered mental status since 12/31. Unsteady on feet. Blunt abdominal trauma. Abdominal pain. EXAM: CT ANGIOGRAPHY CHEST CT ABDOMEN AND PELVIS WITH CONTRAST TECHNIQUE: Multidetector CT imaging of the chest was performed using the standard protocol during bolus administration of intravenous contrast. Multiplanar CT image reconstructions and MIPs were obtained to evaluate the vascular anatomy. Multidetector CT imaging of the abdomen and pelvis was performed using the standard protocol during bolus administration of intravenous contrast. RADIATION DOSE REDUCTION: This exam was performed according to the departmental dose-optimization program which includes automated exposure control, adjustment of the mA and/or kV according to patient size and/or use of iterative reconstruction technique. CONTRAST:  75mL OMNIPAQUE  IOHEXOL  350 MG/ML SOLN COMPARISON:  Ultrasound 07/30/2023; chest radiograph 07/30/2023; CTA chest 11/08/2021; CT abdomen and pelvis 09/21/2015 FINDINGS: CTA CHEST FINDINGS Cardiovascular: Cardiomegaly. No pericardial effusion. Reflux of contrast into the IVC and hepatic veins compatible with elevated right heart pressures. Negative for acute pulmonary embolism. Coronary artery and aortic atherosclerotic calcification. Mediastinum/Nodes: Trachea and esophagus are unremarkable. No thoracic adenopathy. Lungs/Pleura: Patchy bilateral ground-glass opacities with interlobular septal thickening. Diffuse bronchial wall thickening. Trace bilateral pleural effusions. No pneumothorax. Musculoskeletal: No acute fracture. Review of the  MIP images confirms the above findings. CT ABDOMEN and PELVIS FINDINGS Hepatobiliary: Respiratory motion limits assessment of the upper abdomen. Questionable nodular hepatic contour which can be seen with cirrhosis. No definite gallbladder abnormality though motion limits assessment of the gallbladder wall. No definite biliary dilation. Pancreas: Unremarkable. Spleen: Unremarkable. Adrenals/Urinary Tract: Stable adrenal glands. Cortical geographic hypoattenuation in the kidneys could be due to chronic scarring or pyelonephritis. No urinary calculi or hydronephrosis. Bladder wall thickening with perivesical stranding. Stomach/Bowel: Normal caliber large and small bowel. Stool ball in the rectum. Stomach and appendix are within normal limits. Vascular/Lymphatic: Aortic atherosclerosis. No enlarged abdominal or pelvic lymph nodes. Reproductive: Unremarkable. Other: Mesenteric and body wall edema. Small volume abdominopelvic ascites. No free intraperitoneal air. Musculoskeletal: No acute fracture. Review of the MIP images confirms the above findings. IMPRESSION: 1. Negative for acute pulmonary embolism. 2. Cardiomegaly with elevated right heart pressures. 3. Ground-glass opacities and interlobular septal thickening with trace bilateral pleural effusions. Prominent differential considerations are pulmonary edema and atypical pneumonia. 4. Anasarca with small volume abdominopelvic ascites. 5. Bladder wall thickening with perivesical stranding. Correlate with urinalysis to exclude cystitis. 6. Cortical geographic hypoattenuation in the kidneys could be due to chronic scarring or pyelonephritis. 7. Questionable nodular hepatic contour which can be seen with cirrhosis. Aortic Atherosclerosis (ICD10-I70.0). Electronically Signed   By: Norman Gatlin M.D.   On: 07/31/2023 02:04   US  Abdomen Limited RUQ (LIVER/GB) Result Date: 07/30/2023 CLINICAL DATA:  Transaminitis EXAM: ULTRASOUND ABDOMEN LIMITED RIGHT UPPER QUADRANT  COMPARISON:  CT 09/21/2015 FINDINGS: Gallbladder: Sludge filled gallbladder. Increased wall thickness at 4.9 mm. Negative sonographic Murphy. Trace pericholecystic fluid Common bile duct: Diameter: 4.7 mm Liver: No focal hepatic abnormality. Echogenicity within normal limits. Trace perihepatic ascites. Questionable surface nodularity of the liver portal vein is patent on color Doppler imaging with normal direction of blood flow towards the liver. Other: Trace perihepatic ascites IMPRESSION: 1. Sludge filled gallbladder with increased wall thickness and trace pericholecystic fluid but negative sonographic Murphy. If acute gallbladder disease is a concern, correlation with nuclear medicine hepatobiliary imaging could be obtained. 2. Trace perihepatic ascites. Questionable surface nodularity of the liver, correlate for cirrhosis risk factors. Electronically Signed   By: Luke Bun M.D.   On: 07/30/2023 22:31   CT Head Wo Contrast Result Date: 07/30/2023 CLINICAL DATA:  Mental status change EXAM: CT HEAD WITHOUT CONTRAST TECHNIQUE: Contiguous axial images were obtained from the base of the skull through the vertex without intravenous contrast. RADIATION DOSE REDUCTION: This exam was performed according to the departmental dose-optimization program which includes automated exposure control, adjustment of the mA and/or kV according to patient size and/or use of iterative reconstruction technique. COMPARISON:  CT brain 06/18/2022 trauma MRI 08/26/2021 FINDINGS: Brain: Motion degradation limits the exam. No gross hemorrhage or intracranial mass. Atrophy and chronic small vessel ischemic changes of the white matter. Chronic right parietal infarct. Interval right posterior frontal infarct, series 3, image 25, suspect chronic but new from prior head CT. Interval hypodensity at the right posterior temporal and parietal lobes consistent with infarct, probably chronic but limited by motion degradation and more extensive when  compared to the CT from November of 2023. Ventricles are nonenlarged. Vascular: No hyperdense vessels.  Carotid vascular calcification Skull: Normal. Negative for fracture or focal lesion. Sinuses/Orbits: Moderate mucosal thickening within the paranasal sinuses. Other: None IMPRESSION: 1. Significantly limited by motion degradation. No obvious hemorrhage, mass or mass effect. 2. Atrophy and chronic small vessel ischemic changes of the white matter 3. Prior chronic right parietal infarct. Interval right posterior frontal and right temporoparietal areas of suspected infarction, suspect chronic but new as compared with head CT from 2023 and assessment is limited by significant motion degradation. Electronically Signed   By: Luke Bun M.D.   On: 07/30/2023 20:59   DG Chest Port 1 View Result Date: 07/30/2023 CLINICAL DATA:  Altered mental status EXAM: PORTABLE CHEST 1 VIEW COMPARISON:  Chest x-ray 11/13/2022 FINDINGS: The heart is enlarged, unchanged. There is no pleural effusion or pneumothorax. Both lungs are clear. The visualized skeletal structures are unremarkable. IMPRESSION: 1. No active disease. 2. Cardiomegaly. Electronically Signed   By: Greig Pique M.D.   On: 07/30/2023 19:29     TODAY-DAY OF DISCHARGE:  Subjective:   Lucas Fuller today has no headache,no chest abdominal pain,no new weakness tingling or numbness, feels much better wants to go home today.   Objective:   Blood pressure 93/63, pulse (!) 59, temperature 97.9 F (36.6 C), temperature source Axillary, resp. rate 12, height 5' 7 (1.702 m), weight 67.4 kg, SpO2 100%.  Intake/Output Summary (Last 24 hours) at 08/02/2023 1140 Last data filed at 08/01/2023 1659 Gross per 24 hour  Intake 200 ml  Output --  Net 200 ml   Filed Weights   07/30/23 1819 08/01/23 0500 08/02/23 0500  Weight: 61.2 kg 66.2 kg 67.4 kg    Exam: Awake Alert, Oriented *3, No new F.N deficits, Normal affect [place].AT,PERRAL Supple Neck,No JVD, No  cervical lymphadenopathy appriciated.  Symmetrical Chest wall movement, Good air movement bilaterally, CTAB RRR,No Gallops,Rubs or new Murmurs, No Parasternal Heave +ve B.Sounds, Abd Soft, Non tender, No organomegaly appriciated, No rebound -guarding or rigidity. No Cyanosis, Clubbing or edema, No new Rash or bruise   PERTINENT RADIOLOGIC STUDIES: ECHOCARDIOGRAM COMPLETE Result Date: 07/31/2023    ECHOCARDIOGRAM REPORT   Patient Name:   Lucas Fuller Date of Exam: 07/31/2023 Medical Rec #:  [id number]      Height:       67.0 in Accession #:    [id number]     Weight:       135.0 lb Date of Birth:  1941-02-12      BSA:          1.711 m Patient Age:    15 years  BP:           103/73 mmHg Patient Gender: M              HR:           73 bpm. Exam Location:  Inpatient Procedure: 2D Echo, Cardiac Doppler, Color Doppler and Intracardiac            Opacification Agent Indications:    Elevated Troponin  History:        Patient has prior history of Echocardiogram examinations, most                 recent 11/09/2021. Cardiomyopathy and CHF, Migraine,                 Arrythmias:Atrial Fibrillation and Atrial Flutter,                 Signs/Symptoms:Shortness of Breath; Risk Factors:Hypertension.  Sonographer:    Thea Norlander RCS Referring Phys: [phone] SUBRINA SUNDIL IMPRESSIONS  1. Left ventricular ejection fraction, by estimation, is 20 to 25%. The left ventricle has severely decreased function. The left ventricle demonstrates global hypokinesis. The left ventricular internal cavity size was mildly dilated. Left ventricular diastolic parameters are indeterminate.  2. Right ventricular systolic function is moderately reduced. The right ventricular size is normal. There is mildly elevated pulmonary artery systolic pressure. The estimated right ventricular systolic pressure is 42.7 mmHg.  3. Left atrial size was severely dilated.  4. Right atrial size was mildly dilated.  5. Mild to moderate mitral valve  regurgitation.  6. Tricuspid valve regurgitation is moderate.  7. There is moderate calcification of the aortic valve. Aortic valve regurgitation is not visualized.  8. The inferior vena cava is dilated in size with <50% respiratory variability, suggesting right atrial pressure of 15 mmHg. FINDINGS  Left Ventricle: Left ventricular ejection fraction, by estimation, is 20 to 25%. The left ventricle has severely decreased function. The left ventricle demonstrates global hypokinesis. Definity  contrast agent was given IV to delineate the left ventricular endocardial borders. The left ventricular internal cavity size was mildly dilated. There is no left ventricular hypertrophy. Left ventricular diastolic parameters are indeterminate. Right Ventricle: The right ventricular size is normal. Right ventricular systolic function is moderately reduced. There is mildly elevated pulmonary artery systolic pressure. The tricuspid regurgitant velocity is 2.63 m/s, and with an assumed right atrial pressure of 15 mmHg, the estimated right ventricular systolic pressure is 42.7 mmHg. Left Atrium: Left atrial size was severely dilated. Right Atrium: Right atrial size was mildly dilated. Pericardium: There is no evidence of pericardial effusion. Mitral Valve: Mild to moderate mitral valve regurgitation. Tricuspid Valve: Tricuspid valve regurgitation is moderate. Aortic Valve: There is moderate calcification of the aortic valve. Aortic valve regurgitation is not visualized. Aortic valve mean gradient measures 7.0 mmHg. Aortic valve peak gradient measures 13.2 mmHg. Aortic valve area, by VTI measures 1.06 cm. Pulmonic Valve: Pulmonic valve regurgitation is trivial. Aorta: The aortic root and ascending aorta are structurally normal, with no evidence of dilitation. Venous: The inferior vena cava is dilated in size with less than 50% respiratory variability, suggesting right atrial pressure of 15 mmHg. IAS/Shunts: No atrial level shunt  detected by color flow Doppler.  LEFT VENTRICLE PLAX 2D LVIDd:         5.10 cm   Diastology LVIDs:         4.80 cm   LV e' medial:    4.26 cm/s LV PW:  0.80 cm   LV E/e' medial:  26.3 LV IVS:        0.80 cm   LV e' lateral:   7.42 cm/s LVOT diam:     2.20 cm   LV E/e' lateral: 15.1 LV SV:         39 LV SV Index:   23 LVOT Area:     3.80 cm  RIGHT VENTRICLE            IVC RV S prime:     4.81 cm/s  IVC diam: 2.40 cm LEFT ATRIUM              Index        RIGHT ATRIUM           Index LA diam:        5.00 cm  2.92 cm/m   RA Area:     17.90 cm LA Vol (A2C):   80.4 ml  46.99 ml/m  RA Volume:   43.00 ml  25.13 ml/m LA Vol (A4C):   124.0 ml 72.47 ml/m LA Biplane Vol: 105.0 ml 61.36 ml/m  AORTIC VALVE                     PULMONIC VALVE AV Area (Vmax):    1.14 cm      PR End Diast Vel: 3.88 msec AV Area (Vmean):   1.09 cm AV Area (VTI):     1.06 cm AV Vmax:           182.00 cm/s AV Vmean:          120.000 cm/s AV VTI:            0.366 m AV Peak Grad:      13.2 mmHg AV Mean Grad:      7.0 mmHg LVOT Vmax:         54.53 cm/s LVOT Vmean:        34.433 cm/s LVOT VTI:          0.102 m LVOT/AV VTI ratio: 0.28  AORTA Ao Root diam: 3.70 cm Ao Asc diam:  3.60 cm MITRAL VALVE                TRICUSPID VALVE MV Area (PHT): 6.48 cm     TR Peak grad:   27.7 mmHg MV Decel Time: 117 msec     TR Vmax:        263.00 cm/s MR Peak grad: 38.3 mmHg MR Vmax:      309.50 cm/s   SHUNTS MV E velocity: 112.00 cm/s  Systemic VTI:  0.10 m                             Systemic Diam: 2.20 cm Ronal Ross Electronically signed by Ronal Ross Signature Date/Time: 07/31/2023/5:26:43 PM    Final    NM Hepatobiliary Liver Func Result Date: 07/31/2023 CLINICAL DATA:  Right upper quadrant pain. Gallbladder sludge on recent ultrasound. EXAM: NUCLEAR MEDICINE HEPATOBILIARY IMAGING TECHNIQUE: Sequential images of the abdomen were obtained out to 60 minutes following intravenous administration of radiopharmaceutical. Due to lack of gallbladder activity  at 60 minutes, imaging was continued for another 60 minutes following administration of 2.5 mg IV morphine . RADIOPHARMACEUTICALS:  7.8 mCi Tc-9m  Choletec  IV COMPARISON:  None Available. FINDINGS: Prompt uptake and biliary excretion of activity by the liver is seen. Biliary activity passes into small bowel, consistent with patent  common bile duct. No gallbladder activity was seen at 60 minutes, however, gallbladder activity is present following morphine  administration. IMPRESSION: No evidence of obstruction of cystic duct or common bile duct. Electronically Signed   By: Norleen DELENA Kil M.D.   On: 07/31/2023 14:31     PERTINENT LAB RESULTS: CBC: Recent Labs    07/31/23 0402 08/01/23 0422  WBC 9.0 5.8  HGB 14.7 12.3*  HCT 43.8 35.2*  PLT 132* 130*   CMET CMP     Component Value Date/Time   NA 133 (L) 08/02/2023 0606   NA 140 01/28/2020 1631   K 4.8 08/02/2023 0606   CL 100 08/02/2023 0606   CO2 18 (L) 08/02/2023 0606   GLUCOSE 105 (H) 08/02/2023 0606   BUN 23 08/02/2023 0606   BUN 13 01/28/2020 1631   CREATININE 1.05 08/02/2023 0606   CREATININE 1.01 05/14/2016 0859   CALCIUM  8.0 (L) 08/02/2023 0606   PROT 6.1 (L) 08/01/2023 0422   ALBUMIN 2.5 (L) 08/01/2023 0422   AST 67 (H) 08/01/2023 0422   ALT 21 08/01/2023 0422   ALKPHOS 43 08/01/2023 0422   BILITOT 2.0 (H) 08/01/2023 0422   GFRNONAA >60 08/02/2023 0606    GFR Estimated Creatinine Clearance: 49.8 mL/min (by C-G formula based on SCr of 1.05 mg/dL). No results for input(s): LIPASE, AMYLASE in the last 72 hours. Recent Labs    07/31/23 0030 07/31/23 1620 08/02/23 0606  CKTOTAL 1,730* 1,089* 662*   Invalid input(s): POCBNP No results for input(s): DDIMER in the last 72 hours. No results for input(s): HGBA1C in the last 72 hours. No results for input(s): CHOL, HDL, LDLCALC, TRIG, CHOLHDL, LDLDIRECT in the last 72 hours. Recent Labs    07/31/23 0450  TSH 5.442*   No results for input(s):  VITAMINB12, FOLATE, FERRITIN, TIBC, IRON, RETICCTPCT in the last 72 hours. Coags: No results for input(s): INR in the last 72 hours.  Invalid input(s): PT Microbiology: Recent Results (from the past 240 hours)  Culture, blood (Routine X 2) w Reflex to ID Panel     Status: None (Preliminary result)   Collection Time: 07/31/23  6:25 AM   Specimen: BLOOD  Result Value Ref Range Status   Specimen Description BLOOD SITE NOT SPECIFIED  Final   Special Requests   Final    AEROBIC BOTTLE ONLY Blood Culture results may not be optimal due to an inadequate volume of blood received in culture bottles   Culture   Final    NO GROWTH 2 DAYS Performed at [facility], [address], [place], [place] [address]    Report Status PENDING  Incomplete  Culture, blood (Routine X 2) w Reflex to ID Panel     Status: None (Preliminary result)   Collection Time: 07/31/23  6:26 AM   Specimen: BLOOD LEFT HAND  Result Value Ref Range Status   Specimen Description BLOOD LEFT HAND  Final   Special Requests   Final    AEROBIC BOTTLE ONLY Blood Culture results may not be optimal due to an inadequate volume of blood received in culture bottles   Culture   Final    NO GROWTH 2 DAYS Performed at [facility], [address], [place], [place] [address]    Report Status PENDING  Incomplete    FURTHER DISCHARGE INSTRUCTIONS:  Get Medicines reviewed and adjusted: Please take all your medications with you for your next visit with your Primary MD  Laboratory/radiological data: Please request your Primary  MD to go over all hospital tests and procedure/radiological results at the follow up, please ask your Primary MD to get all Hospital records sent to his/her office.  In some cases, they will be blood work, cultures and biopsy results pending at the time of your discharge. Please request that your primary care M.D. goes through all the records of your hospital data and follows up on these  results.  Also Note the following: If you experience worsening of your admission symptoms, develop shortness of breath, life threatening emergency, suicidal or homicidal thoughts you must seek medical attention immediately by calling 911 or calling your MD immediately  if symptoms less severe.  You must read complete instructions/literature along with all the possible adverse reactions/side effects for all the Medicines you take and that have been prescribed to you. Take any new Medicines after you have completely understood and accpet all the possible adverse reactions/side effects.   Do not drive when taking Pain medications or sleeping medications (Benzodaizepines)  Do not take more than prescribed Pain, Sleep and Anxiety Medications. It is not advisable to combine anxiety,sleep and pain medications without talking with your primary care practitioner  Special Instructions: If you have smoked or chewed Tobacco  in the last 2 yrs please stop smoking, stop any regular Alcohol   and or any Recreational drug use.  Wear Seat belts while driving.  Please note: You were cared for by a hospitalist during your hospital stay. Once you are discharged, your primary care physician will handle any further medical issues. Please note that NO REFILLS for any discharge medications will be authorized once you are discharged, as it is imperative that you return to your primary care physician (or establish a relationship with a primary care physician if you do not have one) for your post hospital discharge needs so that they can reassess your need for medications and monitor your lab values.  Total Time spent coordinating discharge including counseling, education and face to face time equals greater than 30 minutes.  Signed: Roisin Mones 08/02/2023 11:40 AM

## 2023-08-02 NOTE — Plan of Care (Signed)

## 2023-08-04 NOTE — Progress Notes (Signed)
     Consult received. Chart reviewed. Note that patient is discharging today to SNF [facility]), with recommendation for outpatient palliative follow-up.      Recardo Loll, NP-C Palliative Medicine   Please call Palliative Medicine team phone with any questions [phone]. For individual providers please see AMION.   No charge

## 2023-08-05 DIAGNOSIS — I5041 Acute combined systolic (congestive) and diastolic (congestive) heart failure: Secondary | ICD-10-CM | POA: Diagnosis not present

## 2023-08-05 DIAGNOSIS — R531 Weakness: Secondary | ICD-10-CM | POA: Diagnosis not present

## 2023-08-05 DIAGNOSIS — E039 Hypothyroidism, unspecified: Secondary | ICD-10-CM | POA: Diagnosis not present

## 2023-08-05 DIAGNOSIS — Z9181 History of falling: Secondary | ICD-10-CM | POA: Diagnosis not present

## 2023-08-05 DIAGNOSIS — I10 Essential (primary) hypertension: Secondary | ICD-10-CM | POA: Diagnosis not present

## 2023-08-05 DIAGNOSIS — M6282 Rhabdomyolysis: Secondary | ICD-10-CM | POA: Diagnosis not present

## 2023-08-05 DIAGNOSIS — L89153 Pressure ulcer of sacral region, stage 3: Secondary | ICD-10-CM | POA: Diagnosis not present

## 2023-08-05 DIAGNOSIS — K819 Cholecystitis, unspecified: Secondary | ICD-10-CM | POA: Diagnosis not present

## 2023-08-05 DIAGNOSIS — L89313 Pressure ulcer of right buttock, stage 3: Secondary | ICD-10-CM | POA: Diagnosis not present

## 2023-08-05 LAB — CULTURE, BLOOD (ROUTINE X 2)
Culture: NO GROWTH
Culture: NO GROWTH

## 2023-08-06 DIAGNOSIS — I10 Essential (primary) hypertension: Secondary | ICD-10-CM | POA: Diagnosis not present

## 2023-08-06 DIAGNOSIS — R638 Other symptoms and signs concerning food and fluid intake: Secondary | ICD-10-CM | POA: Diagnosis not present

## 2023-08-06 DIAGNOSIS — M6282 Rhabdomyolysis: Secondary | ICD-10-CM | POA: Diagnosis not present

## 2023-08-06 DIAGNOSIS — I5041 Acute combined systolic (congestive) and diastolic (congestive) heart failure: Secondary | ICD-10-CM | POA: Diagnosis not present

## 2023-08-07 DIAGNOSIS — R531 Weakness: Secondary | ICD-10-CM | POA: Diagnosis not present

## 2023-08-07 DIAGNOSIS — L89313 Pressure ulcer of right buttock, stage 3: Secondary | ICD-10-CM | POA: Diagnosis not present

## 2023-08-07 DIAGNOSIS — R638 Other symptoms and signs concerning food and fluid intake: Secondary | ICD-10-CM | POA: Diagnosis not present

## 2023-08-08 NOTE — Progress Notes (Deleted)
  Electrophysiology Office Note:   Date:  08/08/2023  ID:  Lucas Fuller, DOB March 14, 1941, MRN [id number]  Primary Cardiologist: Lewayne Bunting, MD Primary Heart Failure: None Electrophysiologist: None  {Click to update primary MD,subspecialty MD or APP then REFRESH:1}    History of Present Illness:   Lucas Fuller is a 83 y.o. male with h/o AFL s/p ablation 2017, syncope, NICM, HFrEF, ETOH, CVA (off anticoagulation), bladder CA seen today for routine electrophysiology followup.   Since last being seen in our clinic the patient reports doing ***.  he denies chest pain, palpitations, dyspnea, PND, orthopnea, nausea, vomiting, dizziness, syncope, edema, weight gain, or early satiety.   Review of systems complete and found to be negative unless listed in HPI.   EP Information / Studies Reviewed:    EKG is not ordered today. EKG from 07/31/23 reviewed which showed baseline artifact, ~80 bpm      Studies:  LHC 2017 > clean coronary arteries, NICM EPS 2017 > RFA ablation of atrial flutter along the CTI  ECHO 07/31/23 > LVEF 20-25%, LA severely dilated, RA mildly dilated   Arrhythmia / AAD AFL     Risk Assessment/Calculations:    CHA2DS2-VASc Score = 6  {Confirm score is correct.  If not, click here to update score.  REFRESH note.  :1} This indicates a 9.7% annual risk of stroke. The patient's score is based upon: CHF History: 1 HTN History: 1 Diabetes History: 0 Stroke History: 2 Vascular Disease History: 0 Age Score: 2 Gender Score: 0   {This patient has a significant risk of stroke if diagnosed with atrial fibrillation.  Please consider VKA or DOAC agent for anticoagulation if the bleeding risk is acceptable.   You can also use the SmartPhrase .[email or web address] for documentation.   :[id number]} No BP recorded.  {Refresh Note OR Click here to enter BP  :1}***        Physical Exam:   VS:  There were no vitals taken for this visit.   Wt Readings from Last 3 Encounters:  08/02/23 148  lb 9.4 oz (67.4 kg)  01/03/22 163 lb (73.9 kg)  11/21/21 165 lb (74.8 kg)     GEN: Well nourished, well developed in no acute distress NECK: No JVD; No carotid bruits CARDIAC: {EPRHYTHM:[id number]}, no murmurs, rubs, gallops RESPIRATORY:  Clear to auscultation without rales, wheezing or rhonchi  ABDOMEN: Soft, non-tender, non-distended EXTREMITIES:  No edema; No deformity   ASSESSMENT AND PLAN:    Paroxysmal AF  CHA2DS2-VASc 6.  -continue coreg -anticoagulation for stroke prophylaxis  -monitors with ***  Secondary Hypercoagulable State  -continue Eliquis, dose reviewed and appropriate by wt/cr   Chronic Systolic CHF  -euvolemic on exam  -GDMT limited by BP  -on midodrine + carvedilol   Hx CVA  CAD -stroke prophylaxis as above  -statin per primary    Follow up with Dr. Ladona Ridgel or EP APP {EPFOLLOW MW:[id number]}  Signed, Canary Brim, NP-C, AGACNP-BC [facility] - Electrophysiology  08/08/2023, 4:48 PM

## 2023-08-09 ENCOUNTER — Ambulatory Visit: Payer: Medicare Other | Attending: Pulmonary Disease | Admitting: Pulmonary Disease

## 2023-08-09 DIAGNOSIS — I5042 Chronic combined systolic (congestive) and diastolic (congestive) heart failure: Secondary | ICD-10-CM

## 2023-08-09 DIAGNOSIS — Z515 Encounter for palliative care: Secondary | ICD-10-CM | POA: Diagnosis not present

## 2023-08-09 DIAGNOSIS — I428 Other cardiomyopathies: Secondary | ICD-10-CM

## 2023-08-09 DIAGNOSIS — D6869 Other thrombophilia: Secondary | ICD-10-CM

## 2023-08-09 DIAGNOSIS — I480 Paroxysmal atrial fibrillation: Secondary | ICD-10-CM

## 2023-08-12 ENCOUNTER — Encounter: Payer: Self-pay | Admitting: Pulmonary Disease

## 2023-08-12 DIAGNOSIS — E860 Dehydration: Secondary | ICD-10-CM | POA: Diagnosis not present

## 2023-08-12 DIAGNOSIS — L89313 Pressure ulcer of right buttock, stage 3: Secondary | ICD-10-CM | POA: Diagnosis not present

## 2023-08-12 DIAGNOSIS — N179 Acute kidney failure, unspecified: Secondary | ICD-10-CM | POA: Diagnosis not present

## 2023-08-12 DIAGNOSIS — D649 Anemia, unspecified: Secondary | ICD-10-CM | POA: Diagnosis not present

## 2023-08-12 DIAGNOSIS — E871 Hypo-osmolality and hyponatremia: Secondary | ICD-10-CM | POA: Diagnosis not present

## 2023-08-12 DIAGNOSIS — L89153 Pressure ulcer of sacral region, stage 3: Secondary | ICD-10-CM | POA: Diagnosis not present

## 2023-08-12 DIAGNOSIS — E875 Hyperkalemia: Secondary | ICD-10-CM | POA: Diagnosis not present

## 2023-08-13 DIAGNOSIS — R531 Weakness: Secondary | ICD-10-CM | POA: Diagnosis not present

## 2023-08-13 DIAGNOSIS — N179 Acute kidney failure, unspecified: Secondary | ICD-10-CM | POA: Diagnosis not present

## 2023-08-13 DIAGNOSIS — R638 Other symptoms and signs concerning food and fluid intake: Secondary | ICD-10-CM | POA: Diagnosis not present

## 2023-08-13 DIAGNOSIS — E860 Dehydration: Secondary | ICD-10-CM | POA: Diagnosis not present

## 2023-08-14 DIAGNOSIS — R609 Edema, unspecified: Secondary | ICD-10-CM | POA: Diagnosis not present

## 2023-08-14 DIAGNOSIS — E860 Dehydration: Secondary | ICD-10-CM | POA: Diagnosis not present

## 2023-08-14 DIAGNOSIS — R531 Weakness: Secondary | ICD-10-CM | POA: Diagnosis not present

## 2023-08-14 DIAGNOSIS — R339 Retention of urine, unspecified: Secondary | ICD-10-CM | POA: Diagnosis not present

## 2023-08-14 DIAGNOSIS — N179 Acute kidney failure, unspecified: Secondary | ICD-10-CM | POA: Diagnosis not present

## 2023-08-14 DIAGNOSIS — R638 Other symptoms and signs concerning food and fluid intake: Secondary | ICD-10-CM | POA: Diagnosis not present

## 2023-08-15 ENCOUNTER — Encounter (HOSPITAL_COMMUNITY): Payer: Self-pay

## 2023-08-15 ENCOUNTER — Emergency Department (HOSPITAL_COMMUNITY): Payer: Medicare Other

## 2023-08-15 ENCOUNTER — Inpatient Hospital Stay (HOSPITAL_COMMUNITY)
Admission: EM | Admit: 2023-08-15 | Discharge: 2023-08-22 | DRG: 951 | Disposition: A | Discharge status: Skilled Nursing Facility | Payer: Medicare Other | Source: Skilled Nursing Facility | Attending: Student | Admitting: Student

## 2023-08-15 ENCOUNTER — Other Ambulatory Visit: Payer: Self-pay

## 2023-08-15 DIAGNOSIS — Z515 Encounter for palliative care: Principal | ICD-10-CM

## 2023-08-15 DIAGNOSIS — K7200 Acute and subacute hepatic failure without coma: Secondary | ICD-10-CM | POA: Diagnosis not present

## 2023-08-15 DIAGNOSIS — A419 Sepsis, unspecified organism: Secondary | ICD-10-CM

## 2023-08-15 DIAGNOSIS — I77810 Thoracic aortic ectasia: Secondary | ICD-10-CM | POA: Diagnosis not present

## 2023-08-15 DIAGNOSIS — T68XXXD Hypothermia, subsequent encounter: Secondary | ICD-10-CM | POA: Diagnosis not present

## 2023-08-15 DIAGNOSIS — Z8249 Family history of ischemic heart disease and other diseases of the circulatory system: Secondary | ICD-10-CM

## 2023-08-15 DIAGNOSIS — N179 Acute kidney failure, unspecified: Secondary | ICD-10-CM | POA: Diagnosis not present

## 2023-08-15 DIAGNOSIS — I428 Other cardiomyopathies: Secondary | ICD-10-CM | POA: Diagnosis present

## 2023-08-15 DIAGNOSIS — J9601 Acute respiratory failure with hypoxia: Secondary | ICD-10-CM | POA: Diagnosis not present

## 2023-08-15 DIAGNOSIS — R519 Headache, unspecified: Secondary | ICD-10-CM | POA: Diagnosis not present

## 2023-08-15 DIAGNOSIS — R791 Abnormal coagulation profile: Secondary | ICD-10-CM | POA: Diagnosis present

## 2023-08-15 DIAGNOSIS — Z7901 Long term (current) use of anticoagulants: Secondary | ICD-10-CM

## 2023-08-15 DIAGNOSIS — Z8616 Personal history of COVID-19: Secondary | ICD-10-CM

## 2023-08-15 DIAGNOSIS — R748 Abnormal levels of other serum enzymes: Secondary | ICD-10-CM | POA: Diagnosis not present

## 2023-08-15 DIAGNOSIS — Y92129 Unspecified place in nursing home as the place of occurrence of the external cause: Secondary | ICD-10-CM | POA: Diagnosis not present

## 2023-08-15 DIAGNOSIS — M6281 Muscle weakness (generalized): Secondary | ICD-10-CM | POA: Diagnosis not present

## 2023-08-15 DIAGNOSIS — G9389 Other specified disorders of brain: Secondary | ICD-10-CM | POA: Diagnosis not present

## 2023-08-15 DIAGNOSIS — I5041 Acute combined systolic (congestive) and diastolic (congestive) heart failure: Secondary | ICD-10-CM | POA: Diagnosis not present

## 2023-08-15 DIAGNOSIS — T68XXXA Hypothermia, initial encounter: Secondary | ICD-10-CM | POA: Diagnosis not present

## 2023-08-15 DIAGNOSIS — Z8673 Personal history of transient ischemic attack (TIA), and cerebral infarction without residual deficits: Secondary | ICD-10-CM

## 2023-08-15 DIAGNOSIS — R7989 Other specified abnormal findings of blood chemistry: Secondary | ICD-10-CM | POA: Diagnosis present

## 2023-08-15 DIAGNOSIS — Z743 Need for continuous supervision: Secondary | ICD-10-CM | POA: Diagnosis not present

## 2023-08-15 DIAGNOSIS — E039 Hypothyroidism, unspecified: Secondary | ICD-10-CM | POA: Diagnosis not present

## 2023-08-15 DIAGNOSIS — M549 Dorsalgia, unspecified: Secondary | ICD-10-CM | POA: Diagnosis not present

## 2023-08-15 DIAGNOSIS — R570 Cardiogenic shock: Principal | ICD-10-CM | POA: Diagnosis present

## 2023-08-15 DIAGNOSIS — Z833 Family history of diabetes mellitus: Secondary | ICD-10-CM

## 2023-08-15 DIAGNOSIS — E871 Hypo-osmolality and hyponatremia: Secondary | ICD-10-CM | POA: Diagnosis not present

## 2023-08-15 DIAGNOSIS — Z79890 Hormone replacement therapy (postmenopausal): Secondary | ICD-10-CM

## 2023-08-15 DIAGNOSIS — T796XXA Traumatic ischemia of muscle, initial encounter: Secondary | ICD-10-CM | POA: Diagnosis present

## 2023-08-15 DIAGNOSIS — I700 Atherosclerosis of aorta: Secondary | ICD-10-CM | POA: Diagnosis not present

## 2023-08-15 DIAGNOSIS — J449 Chronic obstructive pulmonary disease, unspecified: Secondary | ICD-10-CM | POA: Diagnosis not present

## 2023-08-15 DIAGNOSIS — Z66 Do not resuscitate: Secondary | ICD-10-CM | POA: Diagnosis present

## 2023-08-15 DIAGNOSIS — I4819 Other persistent atrial fibrillation: Secondary | ICD-10-CM | POA: Diagnosis not present

## 2023-08-15 DIAGNOSIS — M6282 Rhabdomyolysis: Secondary | ICD-10-CM | POA: Diagnosis not present

## 2023-08-15 DIAGNOSIS — S299XXA Unspecified injury of thorax, initial encounter: Secondary | ICD-10-CM | POA: Diagnosis not present

## 2023-08-15 DIAGNOSIS — S0990XA Unspecified injury of head, initial encounter: Secondary | ICD-10-CM | POA: Diagnosis not present

## 2023-08-15 DIAGNOSIS — R918 Other nonspecific abnormal finding of lung field: Secondary | ICD-10-CM | POA: Diagnosis not present

## 2023-08-15 DIAGNOSIS — D689 Coagulation defect, unspecified: Secondary | ICD-10-CM | POA: Diagnosis present

## 2023-08-15 DIAGNOSIS — J9811 Atelectasis: Secondary | ICD-10-CM | POA: Diagnosis not present

## 2023-08-15 DIAGNOSIS — W19XXXA Unspecified fall, initial encounter: Secondary | ICD-10-CM | POA: Diagnosis not present

## 2023-08-15 DIAGNOSIS — S3993XA Unspecified injury of pelvis, initial encounter: Secondary | ICD-10-CM | POA: Diagnosis not present

## 2023-08-15 DIAGNOSIS — Z8551 Personal history of malignant neoplasm of bladder: Secondary | ICD-10-CM | POA: Diagnosis not present

## 2023-08-15 DIAGNOSIS — I10 Essential (primary) hypertension: Secondary | ICD-10-CM | POA: Diagnosis not present

## 2023-08-15 DIAGNOSIS — R9082 White matter disease, unspecified: Secondary | ICD-10-CM | POA: Diagnosis present

## 2023-08-15 DIAGNOSIS — I5023 Acute on chronic systolic (congestive) heart failure: Secondary | ICD-10-CM | POA: Diagnosis not present

## 2023-08-15 DIAGNOSIS — L89152 Pressure ulcer of sacral region, stage 2: Secondary | ICD-10-CM | POA: Diagnosis present

## 2023-08-15 DIAGNOSIS — Z1152 Encounter for screening for COVID-19: Secondary | ICD-10-CM

## 2023-08-15 DIAGNOSIS — I5043 Acute on chronic combined systolic (congestive) and diastolic (congestive) heart failure: Secondary | ICD-10-CM | POA: Diagnosis not present

## 2023-08-15 DIAGNOSIS — R110 Nausea: Secondary | ICD-10-CM | POA: Diagnosis not present

## 2023-08-15 DIAGNOSIS — I4891 Unspecified atrial fibrillation: Secondary | ICD-10-CM | POA: Diagnosis not present

## 2023-08-15 DIAGNOSIS — F03C0 Unspecified dementia, severe, without behavioral disturbance, psychotic disturbance, mood disturbance, and anxiety: Secondary | ICD-10-CM | POA: Diagnosis present

## 2023-08-15 DIAGNOSIS — R6521 Severe sepsis with septic shock: Secondary | ICD-10-CM | POA: Diagnosis not present

## 2023-08-15 DIAGNOSIS — G9341 Metabolic encephalopathy: Secondary | ICD-10-CM | POA: Diagnosis not present

## 2023-08-15 DIAGNOSIS — M47812 Spondylosis without myelopathy or radiculopathy, cervical region: Secondary | ICD-10-CM | POA: Diagnosis not present

## 2023-08-15 DIAGNOSIS — F0390 Unspecified dementia without behavioral disturbance: Secondary | ICD-10-CM | POA: Diagnosis present

## 2023-08-15 DIAGNOSIS — J90 Pleural effusion, not elsewhere classified: Secondary | ICD-10-CM | POA: Diagnosis not present

## 2023-08-15 DIAGNOSIS — R188 Other ascites: Secondary | ICD-10-CM | POA: Diagnosis present

## 2023-08-15 DIAGNOSIS — Z9181 History of falling: Secondary | ICD-10-CM | POA: Diagnosis not present

## 2023-08-15 DIAGNOSIS — I110 Hypertensive heart disease with heart failure: Secondary | ICD-10-CM | POA: Diagnosis not present

## 2023-08-15 DIAGNOSIS — R531 Weakness: Secondary | ICD-10-CM | POA: Diagnosis not present

## 2023-08-15 DIAGNOSIS — Z823 Family history of stroke: Secondary | ICD-10-CM

## 2023-08-15 DIAGNOSIS — I482 Chronic atrial fibrillation, unspecified: Secondary | ICD-10-CM | POA: Diagnosis not present

## 2023-08-15 DIAGNOSIS — I480 Paroxysmal atrial fibrillation: Secondary | ICD-10-CM | POA: Diagnosis not present

## 2023-08-15 DIAGNOSIS — Z7401 Bed confinement status: Secondary | ICD-10-CM | POA: Diagnosis not present

## 2023-08-15 DIAGNOSIS — M5080 Other cervical disc disorders, unspecified cervical region: Secondary | ICD-10-CM | POA: Diagnosis not present

## 2023-08-15 DIAGNOSIS — K819 Cholecystitis, unspecified: Secondary | ICD-10-CM | POA: Diagnosis not present

## 2023-08-15 DIAGNOSIS — E8809 Other disorders of plasma-protein metabolism, not elsewhere classified: Secondary | ICD-10-CM | POA: Diagnosis not present

## 2023-08-15 DIAGNOSIS — D649 Anemia, unspecified: Secondary | ICD-10-CM | POA: Diagnosis not present

## 2023-08-15 DIAGNOSIS — D696 Thrombocytopenia, unspecified: Secondary | ICD-10-CM | POA: Diagnosis present

## 2023-08-15 DIAGNOSIS — Z79899 Other long term (current) drug therapy: Secondary | ICD-10-CM

## 2023-08-15 DIAGNOSIS — R680 Hypothermia, not associated with low environmental temperature: Secondary | ICD-10-CM | POA: Diagnosis present

## 2023-08-15 DIAGNOSIS — I5020 Unspecified systolic (congestive) heart failure: Secondary | ICD-10-CM | POA: Diagnosis not present

## 2023-08-15 DIAGNOSIS — R627 Adult failure to thrive: Secondary | ICD-10-CM | POA: Diagnosis not present

## 2023-08-15 DIAGNOSIS — Z886 Allergy status to analgesic agent status: Secondary | ICD-10-CM

## 2023-08-15 DIAGNOSIS — E872 Acidosis, unspecified: Secondary | ICD-10-CM | POA: Diagnosis not present

## 2023-08-15 DIAGNOSIS — R932 Abnormal findings on diagnostic imaging of liver and biliary tract: Secondary | ICD-10-CM | POA: Diagnosis not present

## 2023-08-15 DIAGNOSIS — I959 Hypotension, unspecified: Secondary | ICD-10-CM | POA: Diagnosis not present

## 2023-08-15 DIAGNOSIS — Z23 Encounter for immunization: Secondary | ICD-10-CM | POA: Diagnosis not present

## 2023-08-15 DIAGNOSIS — L89153 Pressure ulcer of sacral region, stage 3: Secondary | ICD-10-CM | POA: Diagnosis not present

## 2023-08-15 DIAGNOSIS — R579 Shock, unspecified: Principal | ICD-10-CM

## 2023-08-15 LAB — URINALYSIS, W/ REFLEX TO CULTURE (INFECTION SUSPECTED)
Bilirubin Urine: NEGATIVE
Glucose, UA: NEGATIVE mg/dL
Ketones, ur: NEGATIVE mg/dL
Leukocytes,Ua: NEGATIVE
Nitrite: NEGATIVE
Protein, ur: 30 mg/dL — AB
Specific Gravity, Urine: 1.014 (ref 1.005–1.030)
pH: 5 (ref 5.0–8.0)

## 2023-08-15 LAB — RESP PANEL BY RT-PCR (RSV, FLU A&B, COVID)  RVPGX2
Influenza A by PCR: NEGATIVE
Influenza B by PCR: NEGATIVE
Resp Syncytial Virus by PCR: NEGATIVE
SARS Coronavirus 2 by RT PCR: NEGATIVE

## 2023-08-15 LAB — COMPREHENSIVE METABOLIC PANEL
ALT: 129 U/L — ABNORMAL HIGH (ref 0–44)
AST: 209 U/L — ABNORMAL HIGH (ref 15–41)
Albumin: 2.6 g/dL — ABNORMAL LOW (ref 3.5–5.0)
Alkaline Phosphatase: 68 U/L (ref 38–126)
Anion gap: 15 (ref 5–15)
BUN: 42 mg/dL — ABNORMAL HIGH (ref 8–23)
CO2: 15 mmol/L — ABNORMAL LOW (ref 22–32)
Calcium: 8.5 mg/dL — ABNORMAL LOW (ref 8.9–10.3)
Chloride: 99 mmol/L (ref 98–111)
Creatinine, Ser: 1.54 mg/dL — ABNORMAL HIGH (ref 0.61–1.24)
GFR, Estimated: 44 mL/min — ABNORMAL LOW (ref >60)
Glucose, Bld: 80 mg/dL (ref 70–99)
Potassium: 4.4 mmol/L (ref 3.5–5.1)
Sodium: 129 mmol/L — ABNORMAL LOW (ref 135–145)
Total Bilirubin: 3.6 mg/dL — ABNORMAL HIGH (ref 0.0–1.2)
Total Protein: 6 g/dL — ABNORMAL LOW (ref 6.5–8.1)

## 2023-08-15 LAB — I-STAT CG4 LACTIC ACID, ED
Lactic Acid, Venous: 5.2 mmol/L (ref 0.5–1.9)
Lactic Acid, Venous: 3.6 mmol/L (ref 0.5–1.9)

## 2023-08-15 LAB — CBC WITH DIFFERENTIAL/PLATELET
Abs Immature Granulocytes: 0.04 10*3/uL (ref 0.00–0.07)
Basophils Absolute: 0 10*3/uL (ref 0.0–0.1)
Basophils Relative: 0 %
Eosinophils Absolute: 0 10*3/uL (ref 0.0–0.5)
Eosinophils Relative: 0 %
HCT: 38.4 % — ABNORMAL LOW (ref 39.0–52.0)
Hemoglobin: 12.9 g/dL — ABNORMAL LOW (ref 13.0–17.0)
Immature Granulocytes: 1 %
Lymphocytes Relative: 10 %
Lymphs Abs: 0.7 10*3/uL (ref 0.7–4.0)
MCH: 33.9 pg (ref 26.0–34.0)
MCHC: 33.6 g/dL (ref 30.0–36.0)
MCV: 100.8 fL — ABNORMAL HIGH (ref 80.0–100.0)
Monocytes Absolute: 0.6 10*3/uL (ref 0.1–1.0)
Monocytes Relative: 9 %
Neutro Abs: 5.6 10*3/uL (ref 1.7–7.7)
Neutrophils Relative %: 80 %
Platelets: 60 10*3/uL — ABNORMAL LOW (ref 150–400)
RBC: 3.81 MIL/uL — ABNORMAL LOW (ref 4.22–5.81)
RDW: 16.7 % — ABNORMAL HIGH (ref 11.5–15.5)
WBC: 7 10*3/uL (ref 4.0–10.5)
nRBC: 1.6 % — ABNORMAL HIGH (ref 0.0–0.2)

## 2023-08-15 LAB — CK: Total CK: 1014 U/L — ABNORMAL HIGH (ref 49–397)

## 2023-08-15 LAB — PROTIME-INR
INR: >10 (ref 0.8–1.2)
Prothrombin Time: >90 s — ABNORMAL HIGH (ref 11.4–15.2)

## 2023-08-15 LAB — APTT: aPTT: 71 s — ABNORMAL HIGH (ref 24–36)

## 2023-08-15 MED ORDER — ACETAMINOPHEN 650 MG RE SUPP: 650.0000 mg | Freq: Four times a day (QID) | RECTAL | Status: DC | PRN

## 2023-08-15 MED ORDER — BIOTENE DRY MOUTH MT LIQD: 15.0000 mL | OROMUCOSAL | Status: DC | PRN

## 2023-08-15 MED ORDER — HYDROMORPHONE HCL 1 MG/ML IJ SOLN: 0.5000 mg | INTRAMUSCULAR | Status: DC | PRN

## 2023-08-15 MED ORDER — HALOPERIDOL 1 MG PO TABS: 0.5000 mg | ORAL_TABLET | ORAL | Status: DC | PRN

## 2023-08-15 MED ORDER — GLYCOPYRROLATE 0.2 MG/ML IJ SOLN: 0.2000 mg | INTRAMUSCULAR | Status: DC | PRN

## 2023-08-15 MED ORDER — IOHEXOL 350 MG/ML SOLN
75.0000 mL | Freq: Once | INTRAVENOUS | Status: AC | PRN
  Administered 2023-08-15: 75 mL via INTRAVENOUS

## 2023-08-15 MED ORDER — ONDANSETRON HCL 4 MG/2ML IJ SOLN: 4.0000 mg | Freq: Four times a day (QID) | INTRAMUSCULAR | Status: DC | PRN

## 2023-08-15 MED ORDER — ONDANSETRON 4 MG PO TBDP: 4.0000 mg | ORAL_TABLET | Freq: Four times a day (QID) | ORAL | Status: DC | PRN

## 2023-08-15 MED ORDER — POLYVINYL ALCOHOL 1.4 % OP SOLN: 1.0000 [drp] | Freq: Four times a day (QID) | OPHTHALMIC | Status: DC | PRN

## 2023-08-15 MED ORDER — LACTATED RINGERS IV BOLUS
1000.0000 mL | Freq: Once | INTRAVENOUS | Status: AC
  Administered 2023-08-15: 1000 mL via INTRAVENOUS

## 2023-08-15 MED ORDER — LACTATED RINGERS IV BOLUS: 1000.0000 mL | Freq: Once | INTRAVENOUS | Status: DC

## 2023-08-15 MED ORDER — LORAZEPAM 2 MG/ML IJ SOLN: 1.0000 mg | INTRAMUSCULAR | Status: DC | PRN

## 2023-08-15 MED ORDER — SENNA 8.6 MG PO TABS: 1.0000 | ORAL_TABLET | Freq: Every evening | ORAL | Status: DC | PRN

## 2023-08-15 MED ORDER — LORAZEPAM 2 MG/ML PO CONC: 1.0000 mg | ORAL | Status: DC | PRN

## 2023-08-15 MED ORDER — SODIUM CHLORIDE 0.9 % IV SOLN
1.0000 g | Freq: Once | INTRAVENOUS | Status: AC
  Administered 2023-08-15: 1 g via INTRAVENOUS
  Filled 2023-08-15: qty 10

## 2023-08-15 MED ORDER — ACETAMINOPHEN 325 MG PO TABS: 650.0000 mg | ORAL_TABLET | Freq: Four times a day (QID) | ORAL | Status: DC | PRN

## 2023-08-15 MED ORDER — HALOPERIDOL LACTATE 2 MG/ML PO CONC: 0.5000 mg | ORAL | Status: DC | PRN

## 2023-08-15 MED ORDER — CHLORPROMAZINE HCL 25 MG PO TABS: 25.0000 mg | ORAL_TABLET | Freq: Four times a day (QID) | ORAL | Status: DC | PRN

## 2023-08-15 MED ORDER — OXYCODONE HCL 5 MG PO TABS
5.0000 mg | ORAL_TABLET | ORAL | Status: DC | PRN
  Administered 2023-08-18: 5 mg via ORAL
  Filled 2023-08-15: qty 1

## 2023-08-15 MED ORDER — MIDODRINE HCL 5 MG PO TABS
10.0000 mg | ORAL_TABLET | Freq: Once | ORAL | Status: AC
  Administered 2023-08-15: 10 mg via ORAL
  Filled 2023-08-15: qty 2

## 2023-08-15 MED ORDER — HALOPERIDOL LACTATE 5 MG/ML IJ SOLN: 0.5000 mg | INTRAMUSCULAR | Status: DC | PRN

## 2023-08-15 MED ORDER — LOPERAMIDE HCL 2 MG PO CAPS
2.0000 mg | ORAL_CAPSULE | ORAL | Status: DC | PRN
Start: 2023-08-15 — End: 2023-08-22

## 2023-08-15 MED ORDER — GLYCOPYRROLATE 1 MG PO TABS: 1.0000 mg | ORAL_TABLET | ORAL | Status: DC | PRN

## 2023-08-15 MED ORDER — SODIUM CHLORIDE 0.9 % IV BOLUS
250.0000 mL | Freq: Once | INTRAVENOUS | Status: AC
  Administered 2023-08-15: 250 mL via INTRAVENOUS

## 2023-08-15 MED ORDER — LACTATED RINGERS IV BOLUS
500.0000 mL | Freq: Once | INTRAVENOUS | Status: AC
  Administered 2023-08-15: 500 mL via INTRAVENOUS

## 2023-08-15 MED ORDER — LORAZEPAM 1 MG PO TABS
1.0000 mg | ORAL_TABLET | ORAL | Status: DC | PRN
  Administered 2023-08-16: 1 mg via ORAL
  Filled 2023-08-15: qty 1

## 2023-08-15 NOTE — ED Notes (Signed)
Unable to obtain manual BP in either extremity - per [facility] TRN

## 2023-08-15 NOTE — Hospital Course (Signed)
Lucas Fuller is a 83 y.o. 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  Family have confirmed wishes to transition to comfort care measures only.

## 2023-08-15 NOTE — ED Provider Notes (Signed)
[place] EMERGENCY DEPARTMENT AT [facility] Provider Note   CSN: [id number] Arrival date & time: 08/15/23  8295     History  Chief Complaint  Patient presents with   Fall on Thinners    MESHACH NEZAT is a 83 y.o. male.  The history is provided by the patient, the EMS personnel and medical records.  JAYLAN SERVATIUS is a 83 y.o. male who presents to the Emergency Department complaining of fall.  He presents to the ED by EMS after being found on the ground with blood around him.  Baseline unclear from facility.  He takes Eliquis.  Level 5 caveat due to altered mental status. He currently is a resident at [facility] care.  Pt denies any complaints.     Home Medications Prior to Admission medications   Medication Sig Start Date End Date Taking? Authorizing Provider  apixaban (ELIQUIS) 5 MG TABS tablet Take 1 tablet (5 mg total) by mouth 2 (two) times daily. 08/27/21  Yes Tyrone Nine, MD  acetaminophen (TYLENOL) 500 MG tablet Take 1,000 mg by mouth every 6 (six) hours as needed for moderate pain or mild pain.    [provider]  albuterol (VENTOLIN HFA) 108 (90 Base) MCG/ACT inhaler Inhale 2 puffs into the lungs every 4 (four) hours as needed for wheezing or shortness of breath. 08/22/19   Kathlen Mody, MD  atorvastatin (LIPITOR) 20 MG tablet Take 1 tablet (20 mg total) by mouth daily. 11/21/21   Clegg, Amy D, NP  carvedilol (COREG) 3.125 MG tablet Take 1 tablet by mouth in the morning and at bedtime.    [provider]  furosemide (LASIX) 40 MG tablet Take 0.5 tablets (20 mg total) by mouth daily. 08/02/23   Ghimire, Werner Lean, MD  levothyroxine (SYNTHROID, LEVOTHROID) 50 MCG tablet Take 50 mcg by mouth daily before breakfast.    [provider]  midodrine (PROAMATINE) 5 MG tablet Take 1 tablet (5 mg total) by mouth 3 (three) times daily with meals. 08/02/23   Ghimire, Werner Lean, MD  oxybutynin (DITROPAN-XL) 5 MG 24 hr tablet Take 5 mg by mouth  daily.    [provider]  potassium chloride SA (KLOR-CON M) 20 MEQ tablet Take 20 mEq by mouth daily. 08/01/22   [provider]      Allergies    Asa [aspirin]    Review of Systems   Review of Systems  Unable to perform ROS: Dementia    Physical Exam Updated Vital Signs BP 92/65   Pulse (!) 50   Temp (!) 93.6 F (34.2 C) (Rectal)   Resp 13   Ht 5\' 7"  (1.702 m)   Wt 67 kg   SpO2 100%   BMI 23.13 kg/m  Physical Exam Vitals and nursing note reviewed.  Constitutional:      Appearance: He is well-developed.  HENT:     Head: Normocephalic.     Comments: Dry blood in left nare.  No blood in posterior OP Neck:     Comments: No cspine tenderness Cardiovascular:     Rate and Rhythm: Normal rate and regular rhythm.     Heart sounds: No murmur heard. Pulmonary:     Effort: Pulmonary effort is normal. No respiratory distress.     Breath sounds: Normal breath sounds.  Abdominal:     Palpations: Abdomen is soft.     Tenderness: There is no guarding or rebound.     Comments: Ecchymosis over left flank  with mild local tenderness  Genitourinary:    Comments: No blood in rectal vault. Healing sacral wound.  Musculoskeletal:        General: No tenderness.     Comments: Trace edema to BLE  Skin:    General: Skin is warm and dry.  Neurological:     Mental Status: He is alert.     Comments: Oriented to person, "ambulance" 2024. Mild weakness in LUE, LLE.    Psychiatric:        Behavior: Behavior normal.     ED Results / Procedures / Treatments   Labs (all labs ordered are listed, but only abnormal results are displayed) Labs Reviewed  I-STAT CG4 LACTIC ACID, ED - Abnormal; Notable for the following components:      Result Value   Lactic Acid, Venous 5.2 (*)    All other components within normal limits  CULTURE, BLOOD (ROUTINE X 2)  CULTURE, BLOOD (ROUTINE X 2)  COMPREHENSIVE METABOLIC PANEL  CBC WITH DIFFERENTIAL/PLATELET  PROTIME-INR  APTT   URINALYSIS, W/ REFLEX TO CULTURE (INFECTION SUSPECTED)  I-STAT CHEM 8, ED    EKG None  Radiology DG Chest Port 1 View Result Date: 08/15/2023 CLINICAL DATA:  Level 2 trauma, fall injury on blood thinners. EXAM: PORTABLE PELVIS 1-2 VIEWS; PORTABLE CHEST - 1 VIEW COMPARISON:  CTA chest and CT abdomen and pelvis both 07/31/2023 FINDINGS: Chest AP portable 6:30 a.m.: Stable cardiomegaly.  Central vessels are normal caliber. Subtle right lung perihilar ground-glass opacity is still seen but improved consistent with clearing edema and/or pneumonitis. Minimal pleural effusions appear similar with remaining lungs clear. Stable mediastinum with mild aortic atherosclerosis. Intact thoracic cage. Moderate thoracic spondylosis. Portable AP supine pelvis, single view: No evidence of pelvic fracture or diastasis. Mild arthrosis at the SI joints and hips. No other focal bone abnormality. Grossly unremarkable soft tissues. IMPRESSION: 1. No evidence of acute chest process. 2. Stable cardiomegaly. 3. Improving right lung perihilar ground-glass opacity consistent with clearing edema and/or pneumonitis. 4. Minimal pleural effusions. 5. No evidence of pelvic fracture or diastasis. 6. Aortic atherosclerosis. Electronically Signed   By: Almira Bar M.D.   On: 08/15/2023 06:46   DG Pelvis Portable Result Date: 08/15/2023 CLINICAL DATA:  Level 2 trauma, fall injury on blood thinners. EXAM: PORTABLE PELVIS 1-2 VIEWS; PORTABLE CHEST - 1 VIEW COMPARISON:  CTA chest and CT abdomen and pelvis both 07/31/2023 FINDINGS: Chest AP portable 6:30 a.m.: Stable cardiomegaly.  Central vessels are normal caliber. Subtle right lung perihilar ground-glass opacity is still seen but improved consistent with clearing edema and/or pneumonitis. Minimal pleural effusions appear similar with remaining lungs clear. Stable mediastinum with mild aortic atherosclerosis. Intact thoracic cage. Moderate thoracic spondylosis. Portable AP supine pelvis,  single view: No evidence of pelvic fracture or diastasis. Mild arthrosis at the SI joints and hips. No other focal bone abnormality. Grossly unremarkable soft tissues. IMPRESSION: 1. No evidence of acute chest process. 2. Stable cardiomegaly. 3. Improving right lung perihilar ground-glass opacity consistent with clearing edema and/or pneumonitis. 4. Minimal pleural effusions. 5. No evidence of pelvic fracture or diastasis. 6. Aortic atherosclerosis. Electronically Signed   By: Almira Bar M.D.   On: 08/15/2023 06:46    Procedures Procedures    Medications Ordered in ED Medications  sodium chloride 0.9 % bolus 250 mL (250 mLs Intravenous New Bag/Given 08/15/23 0638)  cefTRIAXone (ROCEPHIN) 1 g in sodium chloride 0.9 % 100 mL IVPB (1 g Intravenous New Bag/Given 08/15/23 0647)    ED  Course/ Medical Decision Making/ A&P                                 Medical Decision Making Amount and/or Complexity of Data Reviewed Labs: ordered. Radiology: ordered.   Pt with hx/o dementia, CHF, afib, CVA on eliquis here after being found down.  Pt with borderline BP in the 90s but he is on midodrine at baseline, EF 20%. He is hypothermic.  Sacral wound without evidence of infection. He was treated with empiric abx for possible infection.  Plan to obtain imaging to eval for traumatic injuries.  Pt care transferred pending additional imaging and work up.          Final Clinical Impression(s) / ED Diagnoses Final diagnoses:  None    Rx / DC Orders ED Discharge Orders     None         Tilden Fossa, MD 08/15/23 [phone]

## 2023-08-15 NOTE — Progress Notes (Signed)
Orthopedic Tech Progress Note Patient Details:  Lucas Fuller 1941-02-14 [id number]  Responded to level 2 trauma, not needed at this time  Patient ID: Lucas Fuller, male   DOB: November 06, 1940, 83 y.o.   MRN: [id number]  Diannia Ruder 08/15/2023, 6:43 AM

## 2023-08-15 NOTE — Progress Notes (Addendum)
Trauma Response Nurse Documentation   PAISLEY MARCIN is a 83 y.o. male arriving to [facility] ED via EMS  On Eliquis (apixaban) daily. Trauma was activated as a Level 2 by ED charge RN based on the following trauma criteria Elderly patients > 65 with head trauma on anti-coagulation (excluding ASA). Trauma team at the bedside on patient arrival.   Patient cleared for CT by Dr. Madilyn Hook EDP. Pt transported to CT with trauma response nurse present to monitor. RN remained with the patient throughout their absence from the department for clinical observation.   GCS 14.  History   Past Medical History:  Diagnosis Date   Acute diastolic congestive heart failure (HCC)    Acute systolic CHF (congestive heart failure) (HCC)    Atrial fibrillation, persistent (HCC)    Atrial flutter (HCC) 12/05/2015   Bladder cancer (HCC)    Cardiomyopathy (HCC)    CHF (congestive heart failure) (HCC) 12/05/2015   Chronic systolic CHF (congestive heart failure) (HCC)    Edema 12/05/2015   Elevated brain natriuretic peptide (BNP) level    Essential hypertension 06/24/2018   ETOH abuse 12/05/2015   History of blood transfusion    Hypothyroidism    Lumbar disc disease 12/05/2015   Malnutrition of moderate degree 12/07/2015   Migraine    "years ago" (05/17/2016)   Non-ischemic cardiomyopathy (HCC)    Paroxysmal atrial fibrillation (HCC)    Pneumonia due to COVID-19 virus 08/18/2019   Protein calorie malnutrition (HCC) 12/05/2015   SOB (shortness of breath) 12/05/2015   Thrombocytopenia (HCC) 12/05/2015   Thyroid disease    Weight loss 12/05/2015     Past Surgical History:  Procedure Laterality Date   CARDIAC CATHETERIZATION N/A 12/08/2015   Procedure: Left Heart Cath and Coronary Angiography;  Surgeon: Runell Gess, MD;  Location: [facility] INVASIVE CV LAB;  Service: Cardiovascular;  Laterality: N/A;   CYSTOSCOPY WITH FULGERATION  2008   Hattie Perch 11/23/2010   ELECTROPHYSIOLOGIC STUDY N/A 05/17/2016   Procedure: A-Flutter  Ablation;  Surgeon: Marinus Maw, MD;  Location: [facility] INVASIVE CV LAB;  Service: Cardiovascular;  Laterality: N/A;   TONSILLECTOMY     TRANSURETHRAL RESECTION OF BLADDER  2008   Hattie Perch 11/23/2010       Initial Focused Assessment (If applicable, or please see trauma documentation): Awake, confused male with hx dementia arrives from SNF for fall with unknown downtime. Bruising to left chest wall with pain.  Airway patent, BS clear No obvious uncontrolled hemorrhage GCS 14 PERRLA 2  CT's Completed:   Handed off to shift change pending ISTAT chem 8  Interventions:  IV start Rectal temp Blood cultures, antibiotics see EMAR Bair hugger  Plan for disposition:  Pending  Consults completed:  None  Event Summary: Presents via EMS from SNF for fall with unknown downtime. Bruising to left chest wall. Rectal temp 93.6, placed on bair hugger. Difficulty obtaining access and blood specimens, delay in transport to CT for this reason. Handed off CT responsibility to [facility] at shift change. MTP Summary (If applicable): NA  Bedside handoff with ED RN Tiffany.    Obi Scrima O Jocie Meroney  Trauma Response RN  Please call TRN at [phone] for further assistance.

## 2023-08-15 NOTE — H&P (Signed)
History and Physical    Lucas Fuller ZOX:[id number] DOB: [birth year] DOA: 08/15/2023  PCP: Georgann Housekeeper, MD  Patient coming from: SNF  I have personally briefly reviewed patient's old medical records in [facility]  Chief Complaint: Unwitnessed fall  HPI: Lucas Fuller is a 83 y.o. 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  Patient is unable to provide history due to dementia which is otherwise obtained by EDP, chart review, and niece Keenan Bachelor by phone.  Patient resides at [facility].  He was reportedly found down on the floor at 0500.  Unknown amount of downtime.  He was brought to the ED for further evaluation.  He had a recent admission 1/7-1/10 for fall versus syncope.  Echo showed newly depressed EF 20-25%.  He was not considered a candidate for cardiac intervention such as ICD given his chronic comorbidities.  Patient has been seen by palliative medicine at his nursing facility.  His niece tells me that they discussed hospice care and they did plan on transitioning to hospice services.  She confirms DNR/DNI status including transition to comfort care measures given his overall presentation concerning for cardiogenic shock with multiorgan dysfunction and limited role for any further intervention.  ED Course  Labs/Imaging on admission: I have personally reviewed following labs and imaging studies.  Initial vitals showed BP 97/68, pulse 73, RR 14, temp 93.6 F rectally, SpO2 100% on room air.  While in the ED patient was hypertensive with SBP as low as 77/52.  Labs showed WBC 7.0, hemoglobin 12.9, platelets 60,000, INR >10, PT >90, sodium 129, potassium 4.4, bicarb 15, BUN 42, creatinine 1.54, serum glucose 80, AST 209,  ALT 129, alk phos 68, total bilirubin 3.6, CK 1014, lactic acid 5.2 > 3.6  UA showed negative nitrites, negative leukocytes, 6-10 RBCs, 0-5 WBCs, rare bacteria.  Blood cultures in process.  SARS-CoV-2, influenza, RSV PCR negative.  Pelvic x-ray negative for fracture or diastases.  CT head and C-spine negative for acute findings.  Stable age-related cerebral atrophy, ventriculomegaly, and periventricular white matter disease noted.  Remote infarct seen.  CT chest/abdomen/pelvis showed bilateral pleural effusions, right larger than left with overlying atelectasis.  Patchy perihilar groundglass opacity suggesting pulmonary edema seen.  No acute abdominal/pelvic findings.  Small to moderate volume abdominal/pelvic ascites and diffuse mesenteric edema slightly progressive since prior CT scan.  CT angio abdomen/pelvis negative for evidence of portal vein thrombosis.  No other acute vascular findings.  Reflux of contrast into the IVC and hepatic veins suggesting elevated right heart pressures.  Small to moderate volume of ascites.  Moderate right and small left pleural effusions with bibasilar atelectasis noted.  Mild contour irregularity of the liver suggesting cirrhosis also reported.  Patient was given total 1.75 L LR, IV ceftriaxone, oral midodrine 10 mg.  EDP discussed with PCCM who recommended medical admission for supportive care in context of prior DNR/DNI documentation as no role for aggressive intervention.  The hospitalist service was consulted to admit.  Review of Systems:  Unable to obtain from patient due to dementia.   Past Medical History:  Diagnosis Date   Acute diastolic congestive heart failure (HCC)    Acute systolic CHF (congestive heart failure) (HCC)    Atrial fibrillation, persistent (HCC)  Atrial flutter (HCC) 12/05/2015   Bladder cancer (HCC)    Cardiomyopathy (HCC)    CHF (congestive heart failure) (HCC) 12/05/2015   Chronic systolic CHF (congestive heart failure) (HCC)     Edema 12/05/2015   Elevated brain natriuretic peptide (BNP) level    Essential hypertension 06/24/2018   ETOH abuse 12/05/2015   History of blood transfusion    Hypothyroidism    Lumbar disc disease 12/05/2015   Malnutrition of moderate degree 12/07/2015   Migraine    "years ago" (05/17/2016)   Non-ischemic cardiomyopathy (HCC)    Paroxysmal atrial fibrillation (HCC)    Pneumonia due to COVID-19 virus 08/18/2019   Protein calorie malnutrition (HCC) 12/05/2015   SOB (shortness of breath) 12/05/2015   Thrombocytopenia (HCC) 12/05/2015   Thyroid disease    Weight loss 12/05/2015    Past Surgical History:  Procedure Laterality Date   CARDIAC CATHETERIZATION N/A 12/08/2015   Procedure: Left Heart Cath and Coronary Angiography;  Surgeon: Runell Gess, MD;  Location: [facility] INVASIVE CV LAB;  Service: Cardiovascular;  Laterality: N/A;   CYSTOSCOPY WITH FULGERATION  2008   Hattie Perch 11/23/2010   ELECTROPHYSIOLOGIC STUDY N/A 05/17/2016   Procedure: A-Flutter Ablation;  Surgeon: Marinus Maw, MD;  Location: [facility] INVASIVE CV LAB;  Service: Cardiovascular;  Laterality: N/A;   TONSILLECTOMY     TRANSURETHRAL RESECTION OF BLADDER  2008   Hattie Perch 11/23/2010    Social History:  reports that he has never smoked. He has never used smokeless tobacco. He reports that he does not currently use alcohol after a past usage of about 6.0 standard drinks of alcohol per week. No history on file for drug use.  Allergies  Allergen Reactions   Asa [Aspirin] Hives    Pt can tolerate 81 mg with no issues    Family History  Problem Relation Age of Onset   Heart attack Father 26   Kidney disease Sister    Hypertension Sister    Lung cancer Sister    CVA Sister    Diabetes Sister      Prior to Admission medications   Medication Sig Start Date End Date Taking? Authorizing Provider  acetaminophen (TYLENOL) 500 MG tablet Take 1,000 mg by mouth every 6 (six) hours as needed for moderate pain or mild pain.   Yes  [provider]  albuterol (VENTOLIN HFA) 108 (90 Base) MCG/ACT inhaler Inhale 2 puffs into the lungs every 4 (four) hours as needed for wheezing or shortness of breath. 08/22/19  Yes Kathlen Mody, MD  apixaban (ELIQUIS) 5 MG TABS tablet Take 1 tablet (5 mg total) by mouth 2 (two) times daily. 08/27/21  Yes Tyrone Nine, MD  atorvastatin (LIPITOR) 20 MG tablet Take 1 tablet (20 mg total) by mouth daily. 11/21/21  Yes Clegg, Amy D, NP  carvedilol (COREG) 3.125 MG tablet Take 1 tablet by mouth in the morning and at bedtime.   Yes [provider]  furosemide (LASIX) 20 MG tablet Take 20 mg by mouth daily.   Yes [provider]  levothyroxine (SYNTHROID, LEVOTHROID) 50 MCG tablet Take 50 mcg by mouth daily before breakfast.   Yes [provider]  midodrine (PROAMATINE) 5 MG tablet Take 1 tablet (5 mg total) by mouth 3 (three) times daily with meals. 08/02/23  Yes Ghimire, Werner Lean, MD  oxybutynin (DITROPAN-XL) 5 MG 24 hr tablet Take 5 mg by mouth daily.   Yes [provider]  sodium chloride 1 g tablet Take 1 g  by mouth daily.   Yes [provider]  furosemide (LASIX) 40 MG tablet Take 0.5 tablets (20 mg total) by mouth daily. Patient not taking: Reported on 08/15/2023 08/02/23   Maretta Bees, MD  potassium chloride SA (KLOR-CON M) 20 MEQ tablet Take 20 mEq by mouth daily. 08/01/22   [provider]    Physical Exam: Vitals:   08/15/23 1300 08/15/23 1309 08/15/23 1345 08/15/23 1415  BP: (!) 86/58  (!) 88/54 91/61  Pulse: 61  64 65  Resp: 14  16 15   Temp:  (!) 97.1 F (36.2 C)    TempSrc:  Rectal    SpO2: 99%  100% 100%  Weight:      Height:       Constitutional: Chronically ill-appearing man resting in bed.  Somnolent but easily awakens.  NAD, calm, appears comfortable Eyes: EOMI, lids and conjunctivae normal ENMT: Mucous membranes are moist. Posterior pharynx clear of any exudate or lesions.Normal dentition.  Neck: normal,  supple, no masses. Respiratory: Diminished breath sounds bilateral lung bases. Normal respiratory effort. No accessory muscle use.  Cardiovascular: Irregularly irregular, no murmurs / rubs / gallops.  Trace bilateral lower extremity edema.  Faint pedal pulses. Abdomen: no tenderness, no masses palpated.  Slightly distended but soft abdomen Musculoskeletal: no clubbing / cyanosis. No joint deformity upper and lower extremities. Good ROM, no contractures. Normal muscle tone.  Skin: no rashes, lesions, ulcers. No induration Neurologic: Sensation intact.  Strength equal bilaterally. Psychiatric: Somnolent but easily awakens.  Oriented to self only.  EKG: Personally reviewed. Atrial fibrillation, rate 61.  Previous EKG showed A-fib RVR rate 151.  Assessment/Plan Principal Problem:   Cardiogenic shock (HCC) Active Problems:   Thrombocytopenia (HCC)   AKI (acute kidney injury) (HCC)   Elevated INR   Elevated LFTs   Hypothermia   Atrial fibrillation, chronic (HCC)   Elevated CK   Dementia without behavioral disturbance (HCC)   ALONTE HOLDAWAY is a 83 y.o. 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  Family have confirmed wishes to transition to comfort care measures only.  Assessment and Plan: Acute on chronic HFrEF/BiV failure with cardiogenic shock Hypotension/lactic acidosis Shock liver/acute thrombocytopenia/coagulopathy Acute kidney injury Hypothermia Elevated CK/rhabdomyolysis Chronic atrial fibrillation Dementia: Patient presenting with clinical picture concerning for cardiogenic shock with multiorgan dysfunction.  Discussed with his emergency contact Keenan Bachelor who confirms DNR/DNI wishes and recent discussions with palliative medicine to move towards hospice care.  She  agrees with transition to comfort care measures and consult to inpatient palliative medicine team. -CODE STATUS is DNR/DNI; transition to full comfort measures only -Hold further IV fluids, antibiotics, lab draws -Oxycodone, IV Dilaudid as needed pain; can transition to continuous Dilaudid infusion if uncontrolled -Ativan/Haldol prn anxiety/agitation/delirium    DVT prophylaxis: None Code Status:   Code Status: Do not attempt resuscitation (DNR) - Comfort care  Family Communication: Discussed with patient's niece Keenan Bachelor by phone [phone] Disposition Plan: Anticipate during hospital expiration versus transfer to hospice Consults called: None Severity of Illness: The appropriate patient status for this patient is INPATIENT. Inpatient status is judged to be reasonable and necessary in order to provide the required intensity of service to ensure the patient's safety. The patient's presenting symptoms, physical exam findings, and initial radiographic and laboratory data in the context of their chronic comorbidities is felt  to place them at high risk for further clinical deterioration. Furthermore, it is not anticipated that the patient will be medically stable for discharge from the hospital within 2 midnights of admission.   * I certify that at the point of admission it is my clinical judgment that the patient will require inpatient hospital care spanning beyond 2 midnights from the point of admission due to high intensity of service, high risk for further deterioration and high frequency of surveillance required.Darreld Mclean MD [facility]  If 7PM-7AM, please contact night-coverage [email or web address]  08/15/2023, 3:17 PM

## 2023-08-15 NOTE — ED Notes (Signed)
Level 2 FOT -Fall Risk armband placed on pt at checkin

## 2023-08-15 NOTE — Care Management (Signed)
PCCM Brief Note  Received call from EDP to review case. In short, 83 y.o. M with extensive PMH including but not limited to advanced dementia, sCHF with NICM (last echo from 07/31/23 with EF 20-25% with biventricular failure), A.fib on Eliquis, EtOH dependence, protein calorie malnutrition, failure to thrive, recent falls, sacral wounds.  He had recent admission 07/30/23 through 08/02/23 after a fall at his home. Found to have rhabdo, AoC sCHF, possibly PNA, cholelithiasis. Goals of care discussed during that admission and pt's niece Keenan Bachelor (listed in chart as emergency contact) agreeable to DNR given multiple comorbidities with recent decline and poor prognosis.  Discharged to SNF and now back in ED 08/15/23 after a recurrent fall with AMS. Unknown how long he was down for. DNR paperwork in place and reviewed/confirmed by EDP.  Imaging negative for bleed.  Found to have multiple metabolic derangements along with hypothermia and AoC hypotension though MAP in high 60s. Has profound coagulopathy though unsure how accurate? Has transaminitis as well, could this be early hepatic failure (though AST/ALT out of proportion). Hgb stable at 13. No leukocytosis but was hypothermic to 93.6 (found on floor at SNF which could explain part of this). No obvious signs of infection currently.  He has received 1L IVF thus far.  Discussed with EDP and agree that given recent discussion between [facility] with pt and family regarding goals of care moving forward and establishment of DNR (with paperwork from SNF confirming such), not much benefit for ICU admission as wouldn't offer much else at this point besides ongoing supportive measures. Advised to repeat lactate and some additional gentle fluids in light of his rhabdo, AKI, hyponatremia, transaminitis, soft pressures (though chronic, on midodrine). Caution with fluid overload especially in light of his EF and imaging suggestive of elevated right heart pressures. Would  give a few doses of Vitamin K for his coagulopathy.  Would advocate for and strongly recommend palliative care discussions.  I also attempted to call niece Keenan Bachelor multiple times but no answer and no voicemail option.   EDP to have TRH admit to SDU and continue with supportive care while continuing to try to reach family and discuss palliative options moving forward.    Rutherford Guys, PA - C  Pulmonary & Critical Care Medicine For pager details, please see AMION or use Epic chat  After 1900, please call [facility] for cross coverage needs 08/15/2023, 2:09 PM

## 2023-08-15 NOTE — ED Triage Notes (Signed)
Pt BIB [facility] from [facility] care, staff found pt on the floor at 0500. Unknown amount of downtime. Pts reports head and back pain; L nare noted to have dried blood. Pt does take eliquis

## 2023-08-15 NOTE — ED Provider Notes (Addendum)
  Physical Exam  BP (!) 86/58   Pulse 61   Temp (!) 97.1 F (36.2 C) (Rectal)   Resp 14   Ht 5\' 7"  (1.702 m)   Wt 67 kg   SpO2 99%   BMI 23.13 kg/m   Physical Exam  Procedures  .Critical Care  Performed by: Arletha Pili, DO Authorized by: Arletha Pili, DO   Critical care provider statement:    Critical care time (minutes):  101   Critical care was time spent personally by me on the following activities:  Development of treatment plan with patient or surrogate, discussions with consultants, evaluation of patient's response to treatment, examination of patient, ordering and review of laboratory studies, ordering and review of radiographic studies, ordering and performing treatments and interventions, pulse oximetry, re-evaluation of patient's condition and review of old charts   ED Course / MDM    Medical Decision Making Amount and/or Complexity of Data Reviewed Labs: ordered. Radiology: ordered.  Risk Prescription drug management. Decision regarding hospitalization.   Lucas Fuller, assumed care for this patient.  In brief this is an 83 year old male with advanced dementia who was found on the ground this morning.  Patient was signed out pending labs, imaging.  Patient initially hypothermic, hypotensive.  Blood pressure has remained 90s over 60, MAP greater than 65.  He does take midodrine at home.  Patient has received 1 L of IV fluids, currently getting the second.  Patient does meet sepsis criteria.  Out of concern for fluid overload in this patient, will provide the patient with 2 L of IV fluids only as I believe that he will likely develop pulmonary edema which would ultimately require intubation which is not possible in this patient as he is a DNR and DNI.  Patient's labs with multiple abnormalities including transaminitis which is new, and INR greater than 10 in spite of the patient not being on Coumadin.  Obtained CT angiography of the abdomen pelvis  which did not reveal cause for this.  Patient does have an AKI, perhaps shock liver.  Ultimately, this is a critically ill patient who I believe is either septic, acute hepatic failure, dehydrated, rhabdomyolysis.  I reassessed the patient following his fluid administration.  Will admit patient.       Lucas Simmonds T, DO 08/15/23 1327    Lucas Simmonds T, DO 08/16/23 2257

## 2023-08-16 DIAGNOSIS — R570 Cardiogenic shock: Secondary | ICD-10-CM | POA: Diagnosis not present

## 2023-08-16 DIAGNOSIS — N179 Acute kidney failure, unspecified: Secondary | ICD-10-CM | POA: Diagnosis not present

## 2023-08-16 DIAGNOSIS — F0390 Unspecified dementia without behavioral disturbance: Secondary | ICD-10-CM | POA: Diagnosis not present

## 2023-08-16 DIAGNOSIS — Z515 Encounter for palliative care: Secondary | ICD-10-CM

## 2023-08-16 DIAGNOSIS — I5020 Unspecified systolic (congestive) heart failure: Secondary | ICD-10-CM

## 2023-08-16 NOTE — TOC Progression Note (Signed)
Transition of Care [facility]) - Progression Note    Patient Details  Name: FARMER MCCAHILL MRN: [id number] Date of Birth: 1941-05-11  [facility]) CM/SW Contact  Harriet Masson, RN Phone Number: 08/16/2023, 4:09 PM  Clinical Narrative:    Sherron Monday to niece, Steward Drone, who is agreeable for patient to return to [facility]. Steward Drone will complete paperwork to reapply for medicaid.  CSW notified.    Expected Discharge Plan: Skilled Nursing Facility Barriers to Discharge: Continued Medical Work up, English as a second language teacher  Expected Discharge Plan and Services In-house Referral: Clinical Social Work, Hospice / Palliative Care     Living arrangements for the past 2 months: Single Family Home, Skilled Nursing Facility                                       Social Determinants of Health (SDOH) Interventions SDOH Screenings   Food Insecurity: Patient Unable To Answer (08/16/2023)  Housing: Patient Unable To Answer (08/16/2023)  Transportation Needs: Patient Unable To Answer (08/16/2023)  Utilities: Patient Unable To Answer (08/16/2023)  Alcohol Screen: Low Risk  (11/10/2021)  Social Connections: Patient Unable To Answer (08/16/2023)  Tobacco Use: Low Risk  (08/15/2023)    Readmission Risk Interventions     No data to display

## 2023-08-16 NOTE — Plan of Care (Signed)
Patient alert/oriented to self. Patient turned Q2 hours as appropriate. Comfort care measures in place, patient does not seem to be in distress.   Problem: Education: Goal: Knowledge of the prescribed therapeutic regimen will improve Outcome: Progressing   Problem: Coping: Goal: Ability to identify and develop effective coping behavior will improve Outcome: Progressing   Problem: Clinical Measurements: Goal: Quality of life will improve Outcome: Progressing   Problem: Respiratory: Goal: Verbalizations of increased ease of respirations will increase Outcome: Progressing   Problem: Role Relationship: Goal: Family's ability to cope with current situation will improve Outcome: Progressing   Problem: Role Relationship: Goal: Ability to verbalize concerns, feelings, and thoughts to partner or family member will improve Outcome: Progressing   Problem: Pain Management: Goal: Satisfaction with pain management regimen will improve Outcome: Progressing   Problem: Education: Goal: Knowledge of General Education information will improve Description: Including pain rating scale, medication(s)/side effects and non-pharmacologic comfort measures Outcome: Progressing   Problem: Health Behavior/Discharge Planning: Goal: Ability to manage health-related needs will improve Outcome: Progressing   Problem: Clinical Measurements: Goal: Ability to maintain clinical measurements within normal limits will improve Outcome: Progressing   Problem: Clinical Measurements: Goal: Will remain free from infection Outcome: Progressing   Problem: Clinical Measurements: Goal: Diagnostic test results will improve Outcome: Progressing   Problem: Clinical Measurements: Goal: Respiratory complications will improve Outcome: Progressing   Problem: Clinical Measurements: Goal: Cardiovascular complication will be avoided Outcome: Progressing   Problem: Activity: Goal: Risk for activity intolerance will  decrease Outcome: Progressing   Problem: Nutrition: Goal: Adequate nutrition will be maintained Outcome: Progressing   Problem: Coping: Goal: Level of anxiety will decrease Outcome: Progressing   Problem: Elimination: Goal: Will not experience complications related to bowel motility Outcome: Progressing   Problem: Elimination: Goal: Will not experience complications related to urinary retention Outcome: Progressing   Problem: Pain Managment: Goal: General experience of comfort will improve and/or be controlled Outcome: Progressing   Problem: Safety: Goal: Ability to remain free from injury will improve Outcome: Progressing   Problem: Skin Integrity: Goal: Risk for impaired skin integrity will decrease Outcome: Progressing

## 2023-08-16 NOTE — [place] FL2 (Signed)
[place] MEDICAID FL2 LEVEL OF CARE FORM     IDENTIFICATION  Patient Name: Lucas Fuller Birthdate: 26-May-1941 Sex: male Admission Date (Current Location): 08/15/2023  [facility] and [place] Number:  Producer, television/film/video and Address:  [facility], [address], [place], [place] [address]      Provider Number: [id number]  Attending Physician Name and Address:  Baron Hamper, MD  Relative Name and Phone Number:  Keenan Bachelor; Niece; [phone]    Current Level of Care: Hospital Recommended Level of Care: Skilled Nursing Facility Prior Approval Number:    Date Approved/Denied:   PASRR Number: [id number]  Discharge Plan: SNF    Current Diagnoses: Patient Active Problem List   Diagnosis Date Noted   Cardiogenic shock (HCC) 08/15/2023   Elevated INR 08/15/2023   Elevated LFTs 08/15/2023   Hypothermia 08/15/2023   Atrial fibrillation, chronic (HCC) 08/15/2023   Elevated CK 08/15/2023   Dementia without behavioral disturbance (HCC) 08/15/2023   Acalculous cholecystitis 07/31/2023   Acute metabolic encephalopathy 07/31/2023   Elevated bilirubin 07/31/2023   Fall at home, initial encounter 07/31/2023   Rhabdomyolysis 07/31/2023   Elevated troponin 07/31/2023   Atrial fibrillation with rapid ventricular response (HCC) 07/31/2023   Persistent atrial fibrillation (HCC) 07/31/2023   Current use of long term anticoagulation 07/31/2023   Elevated troponin I level 07/31/2023   Nonischemic cardiomyopathy (HCC) 07/31/2023   History of bladder cancer 11/08/2021   Pure hypercholesterolemia 11/08/2021   CHF exacerbation (HCC) 11/08/2021   AKI (acute kidney injury) (HCC) 11/08/2021   History of CVA (cerebrovascular accident) 08/26/2021   Chronic combined systolic and diastolic CHF (congestive heart failure) (HCC) 08/26/2021   Essential hypertension 06/24/2018   Malnutrition of moderate degree 12/07/2015   Acute HFrEF (heart failure with reduced ejection  fraction) (HCC)    Cardiomyopathy (HCC)    Hypothyroidism 12/05/2015   ETOH abuse 12/05/2015   Thrombocytopenia (HCC) 12/05/2015   Lumbar disc disease 12/05/2015   Combined 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) 12/05/2015   Paroxysmal atrial fibrillation (HCC)     Orientation RESPIRATION BLADDER Height & Weight     Self  O2 (2L nasal cannula) Incontinent Weight: 147 lb 11.3 oz (67 kg) Height:  5\' 7"  (170.2 cm)  BEHAVIORAL SYMPTOMS/MOOD NEUROLOGICAL BOWEL NUTRITION STATUS      Continent Diet (Please see dc summary)  AMBULATORY STATUS COMMUNICATION OF NEEDS Skin   Extensive Assist Verbally Normal                       Personal Care Assistance Level of Assistance  Bathing, Feeding, Dressing Bathing Assistance: Maximum assistance Feeding assistance: Maximum assistance Dressing Assistance: Maximum assistance     Functional Limitations Info  Sight, Hearing Sight Info: Impaired (R and L) Hearing Info: Impaired (R and L)      SPECIAL CARE FACTORS FREQUENCY  PT (By licensed PT), OT (By licensed OT)     PT Frequency: 5x OT Frequency: 5x            Contractures Contractures Info: Not present    Additional Factors Info  Code Status, Allergies Code Status Info: DNR- Comfort care Allergies Info: Asa (Aspirin)           Current Medications (08/16/2023):  This is the current hospital active medication list Current Facility-Administered Medications  Medication Dose Route Frequency Provider Last Rate Last Admin   acetaminophen (TYLENOL) tablet 650 mg  650 mg Oral Q6H PRN Darreld Mclean  R, MD       Or   acetaminophen (TYLENOL) suppository 650 mg  650 mg Rectal Q6H PRN Charlsie Quest, MD       antiseptic oral rinse (BIOTENE) solution 15 mL  15 mL Topical PRN Charlsie Quest, MD       chlorproMAZINE (THORAZINE) tablet 25 mg  25 mg Oral QID PRN Charlsie Quest, MD       glycopyrrolate (ROBINUL) tablet 1 mg  1 mg Oral Q4H PRN Charlsie Quest, MD       Or    glycopyrrolate (ROBINUL) injection 0.2 mg  0.2 mg Subcutaneous Q4H PRN Charlsie Quest, MD       Or   glycopyrrolate (ROBINUL) injection 0.2 mg  0.2 mg Intravenous Q4H PRN Charlsie Quest, MD       haloperidol (HALDOL) tablet 0.5 mg  0.5 mg Oral Q4H PRN Charlsie Quest, MD       Or   haloperidol (HALDOL) 2 MG/ML solution 0.5 mg  0.5 mg Sublingual Q4H PRN Charlsie Quest, MD       Or   haloperidol lactate (HALDOL) injection 0.5 mg  0.5 mg Intravenous Q4H PRN Charlsie Quest, MD       HYDROmorphone (DILAUDID) injection 0.5-2 mg  0.5-2 mg Intravenous Q30 min PRN Charlsie Quest, MD       loperamide (IMODIUM) capsule 2 mg  2 mg Oral Q2H PRN Charlsie Quest, MD       LORazepam (ATIVAN) tablet 1 mg  1 mg Oral Q4H PRN Charlsie Quest, MD   1 mg at 08/16/23 1610   Or   LORazepam (ATIVAN) 2 MG/ML concentrated solution 1 mg  1 mg Sublingual Q4H PRN Charlsie Quest, MD       Or   LORazepam (ATIVAN) injection 1 mg  1 mg Intravenous Q4H PRN Charlsie Quest, MD       ondansetron (ZOFRAN-ODT) disintegrating tablet 4 mg  4 mg Oral Q6H PRN Charlsie Quest, MD       Or   ondansetron (ZOFRAN) injection 4 mg  4 mg Intravenous Q6H PRN Charlsie Quest, MD       oxyCODONE (Oxy IR/ROXICODONE) immediate release tablet 5 mg  5 mg Oral Q4H PRN Charlsie Quest, MD       polyvinyl alcohol (LIQUIFILM TEARS) 1.4 % ophthalmic solution 1 drop  1 drop Both Eyes QID PRN Charlsie Quest, MD       senna (SENOKOT) tablet 8.6 mg  1 tablet Oral QHS PRN Charlsie Quest, MD         Discharge Medications: Please see discharge summary for a list of discharge medications.  Relevant Imaging Results:  Relevant Lab Results:   Additional Information SSn: [id number]  Vonnie Spagnolo E Mel Tadros, LCSW

## 2023-08-16 NOTE — Progress Notes (Signed)
  Progress Note   Patient: Lucas Fuller VWU:[id number] DOB: [birth year] DOA: 08/15/2023     1 DOS: the patient was seen and examined on 08/16/2023   Brief hospital course: Lucas Fuller is a 83 y.o. 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  Family have confirmed wishes to transition to comfort care measures only.  Assessment and Plan: Need for comfort care - Robinul PRN  - Haldol PRN  - Dilaudid PRN  - Ativan PRN   Subjective: Pt seen and examined at the bedside. Palliative consulted today and they advised pt is not currently a hospice candidate. CM is working on [place] to get the pt back to his facility.   Physical Exam: Vitals:   08/15/23 1730 08/15/23 1800 08/15/23 2141 08/16/23 0700  BP: (!) 85/70 (!) 89/62 (!) 101/54 (!) 86/66  Pulse: (!) 144 67 68 62  Resp: 14 13 19 17   Temp:   97.7 F (36.5 C) (!) 96 F (35.6 C)  TempSrc:   Oral Oral  SpO2: 96% 100% 100% 100%  Weight:      Height:       Physical Exam HENT:     Head: Normocephalic.     Mouth/Throat:     Mouth: Mucous membranes are moist.  Cardiovascular:     Rate and Rhythm: Tachycardia present.  Pulmonary:     Effort: Pulmonary effort is normal.  Abdominal:     Palpations: Abdomen is soft.  Musculoskeletal:     Cervical back: Neck supple.  Skin:    General: Skin is warm.  Neurological:     Mental Status: He is alert. Mental status is at baseline.  Psychiatric:        Mood and Affect: Mood normal.      Disposition: Status is: Inpatient Remains inpatient appropriate because: CM working on getting auth for pt to return to his facility   Planned Discharge Destination: Skilled nursing facility    Time spent: 35 minutes  Author: Baron Hamper , MD 08/16/2023 1:24 PM  For on call review  [email or web address].

## 2023-08-16 NOTE — Consult Note (Signed)
Palliative Care Consult Note                                  Date: 08/16/2023   Patient Name: Lucas Fuller  DOB: January 12, 1941  MRN: [id number]  Age / Sex: 83 y.o., male  PCP: Georgann Housekeeper, MD Referring Physician: Baron Hamper, MD  Reason for Consultation: {Reason for Consult:23484}  HPI/Patient Profile: 83 y.o. male  with past medical history of *** admitted on 08/15/2023 with ***.   Past Medical History:  Diagnosis Date  . Acute diastolic congestive heart failure (HCC)   . Acute systolic CHF (congestive heart failure) (HCC)   . Atrial fibrillation, persistent (HCC)   . Atrial flutter (HCC) 12/05/2015  . Bladder cancer (HCC)   . Cardiomyopathy (HCC)   . CHF (congestive heart failure) (HCC) 12/05/2015  . Chronic systolic CHF (congestive heart failure) (HCC)   . Edema 12/05/2015  . Elevated brain natriuretic peptide (BNP) level   . Essential hypertension 06/24/2018  . ETOH abuse 12/05/2015  . History of blood transfusion   . Hypothyroidism   . Lumbar disc disease 12/05/2015  . Malnutrition of moderate degree 12/07/2015  . Migraine    "years ago" (05/17/2016)  . Non-ischemic cardiomyopathy (HCC)   . Paroxysmal atrial fibrillation (HCC)   . Pneumonia due to COVID-19 virus 08/18/2019  . Protein calorie malnutrition (HCC) 12/05/2015  . SOB (shortness of breath) 12/05/2015  . Thrombocytopenia (HCC) 12/05/2015  . Thyroid disease   . Weight loss 12/05/2015    Subjective:   Extensive chart review has been completed prior to meeting with patient/family including labs, vital signs, imaging, progress/consult notes, orders, medications and available advance directive documents.   Admitting hospitalist Dr. Allena Katz spoke with niece/[name] regarding patient's overall presentation concerning for cardiogenic shock with multiorgan failure.  He noted that "she agrees with transition to comfort care measures".  He also placed comfort orders.  This NP  assessed patient at bedside.  He is awake/alert, resting comfortably in bed, watching TV.  He denies pain or other complaints.  He is accepting of several large sips of apple juice.  I met with *** to discuss diagnosis, prognosis, GOC, EOL wishes, disposition, and options.  I introduced Palliative Medicine as specialized medical care for people living with serious illness. It focuses on providing relief from the symptoms and stress of a serious illness.   We discussed patient's current illness and what it means in the larger context of his/her ongoing co-morbidities. Current clinical status was reviewed. Natural disease trajectory of *** was discussed.  Created space and opportunity for patient and family to explore thoughts and feelings regarding current medical situation.  Values and goals of care important to patient and family were attempted to be elicited.  A discussion was had today regarding advanced directives. Concepts specific to code status, artifical feeding and hydration, continued IV antibiotics and rehospitalization was had.  The MOST form was introduced and discussed.  Questions and concerns addressed. Patient/family encouraged to call with questions or concerns.     Life Review: ***  Functional Status: ***  Patient/Family Understanding of Illness: ***  Patient Values: ***  Goals: ***  Additional Discussion: ***  Review of Systems  Objective:   Primary Diagnoses: Present on Admission: . Cardiogenic shock (HCC) . Thrombocytopenia (HCC) . Elevated INR . Elevated LFTs . Hypothermia . AKI (acute kidney injury) (HCC) . Atrial fibrillation, chronic (HCC) . Elevated CK .  Dementia without behavioral disturbance (HCC)   Physical Exam  Vital Signs:  BP (!) 86/66 (BP Location: Left Arm)   Pulse 62   Temp (!) 96 F (35.6 C) (Oral)   Resp 17   Ht 5\' 7"  (1.702 m)   Wt 67 kg   SpO2 100%   BMI 23.13 kg/m   Palliative Assessment/Data:  ***     Assessment & Plan:   SUMMARY OF RECOMMENDATIONS   ***  Primary Decision Maker: {Primary Decision WUJWJ:19147}  Code Status/Advance Care Planning: {Palliative Code status:23503}  Symptom Management:  ***  Prognosis:  {Palliative Care Prognosis:23504}  Discharge Planning:  {Palliative dispostion:23505}   Discussed with: ***    Thank you for allowing [place] to participate in the care of [name]   Time Total: ***  Greater than 50%  of this time was spent counseling and coordinating care related to the above assessment and plan.  Signed by: Sherlean Foot, NP Palliative Medicine Team  Team Phone # [phone]  For individual providers, please see AMION

## 2023-08-16 NOTE — TOC Initial Note (Addendum)
Transition of Care [facility]) - Initial/Assessment Note    Patient Details  Name: Lucas Fuller MRN: [id number] Date of Birth: July 18, 1941  Transition of Care [facility]) CM/SW Contact:    Marliss Coots, LCSW Phone Number: 08/16/2023, 1:30 PM  Clinical Narrative:                  1:30 PM Per medical team, patient's niece, Steward Drone, expressed interest in residential hospice and concerns of patient participating in therapy at SNF. Palliative care was consulted regarding hospice. Palliative Care NP informed medical team that patient does not meet residential hospice criteria at this time. RNCM is to inform patient's niece, Steward Drone, of decision. RNCM informed medical team that Steward Drone attempted to apply for Medicaid on patient's behalf one year ago but was declined, and that financial counseling would be consulted.  Expected Discharge Plan: Skilled Nursing Facility Barriers to Discharge: Continued Medical Work up, English as a second language teacher   Patient Goals and CMS Choice            Expected Discharge Plan and Services In-house Referral: Clinical Social Work, Hospice / Palliative Care     Living arrangements for the past 2 months: Single Family Home, Skilled Nursing Facility                                      Prior Living Arrangements/Services Living arrangements for the past 2 months: Single Family Home, Skilled Nursing Facility Lives with:: Facility Resident, Self Patient language and need for interpreter reviewed:: Yes Do you feel safe going back to the place where you live?: Yes      Need for Family Participation in Patient Care: Yes (Comment) Care giver support system in place?: Yes (comment)   Criminal Activity/Legal Involvement Pertinent to Current Situation/Hospitalization: No - Comment as needed  Activities of Daily Living      Permission Sought/Granted Permission sought to share information with : Facility Medical sales representative, Family Supports Permission granted  to share information with : No (Contact information on chart)  Share Information with NAME: Keenan Bachelor  Permission granted to share info w AGENCY: SNF  Permission granted to share info w Relationship: Niece  Permission granted to share info w Contact Information: [phone]  Emotional Assessment   Attitude/Demeanor/Rapport: Unable to Assess Affect (typically observed): Unable to Assess Orientation: : Oriented to Self Alcohol / Substance Use: Not Applicable Psych Involvement: No (comment)  Admission diagnosis:  Cardiogenic shock (HCC) [R57.0] Patient Active Problem List   Diagnosis Date Noted   Cardiogenic shock (HCC) 08/15/2023   Elevated INR 08/15/2023   Elevated LFTs 08/15/2023   Hypothermia 08/15/2023   Atrial fibrillation, chronic (HCC) 08/15/2023   Elevated CK 08/15/2023   Dementia without behavioral disturbance (HCC) 08/15/2023   Acalculous cholecystitis 07/31/2023   Acute metabolic encephalopathy 07/31/2023   Elevated bilirubin 07/31/2023   Fall at home, initial encounter 07/31/2023   Rhabdomyolysis 07/31/2023   Elevated troponin 07/31/2023   Atrial fibrillation with rapid ventricular response (HCC) 07/31/2023   Persistent atrial fibrillation (HCC) 07/31/2023   Current use of long term anticoagulation 07/31/2023   Elevated troponin I level 07/31/2023   Nonischemic cardiomyopathy (HCC) 07/31/2023   History of bladder cancer 11/08/2021   Pure hypercholesterolemia 11/08/2021   CHF exacerbation (HCC) 11/08/2021   AKI (acute kidney injury) (HCC) 11/08/2021   History of CVA (cerebrovascular accident) 08/26/2021   Chronic combined systolic and diastolic CHF (congestive heart failure) (HCC)  08/26/2021   Essential hypertension 06/24/2018   Malnutrition of moderate degree 12/07/2015   Acute HFrEF (heart failure with reduced ejection fraction) (HCC)    Cardiomyopathy (HCC)    Hypothyroidism 12/05/2015   ETOH abuse 12/05/2015   Thrombocytopenia (HCC) 12/05/2015   Lumbar  disc disease 12/05/2015   Combined 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) 12/05/2015   Paroxysmal atrial fibrillation (HCC)    PCP:  Georgann Housekeeper, MD Pharmacy:   [facility] [phone] - [place] Otto, [place] - [address] AT [facility] [address] [place] [address] Phone: [phone] Fax: [phone]     Social Drivers of Health (SDOH) Social History: SDOH Screenings   Food Insecurity: Patient Unable To Answer (08/16/2023)  Housing: Patient Unable To Answer (08/16/2023)  Transportation Needs: Patient Unable To Answer (08/16/2023)  Utilities: Patient Unable To Answer (08/16/2023)  Alcohol Screen: Low Risk  (11/10/2021)  Social Connections: Patient Unable To Answer (08/16/2023)  Tobacco Use: Low Risk  (08/15/2023)   SDOH Interventions:     Readmission Risk Interventions     No data to display

## 2023-08-17 DIAGNOSIS — R570 Cardiogenic shock: Secondary | ICD-10-CM | POA: Diagnosis not present

## 2023-08-17 MED ORDER — ENSURE ENLIVE PO LIQD
237.0000 mL | Freq: Two times a day (BID) | ORAL | Status: DC
  Administered 2023-08-17 – 2023-08-22 (×8): 237 mL via ORAL

## 2023-08-17 NOTE — Plan of Care (Signed)
  Problem: Respiratory: Goal: Verbalizations of increased ease of respirations will increase Outcome: Progressing   Problem: Pain Management: Goal: Satisfaction with pain management regimen will improve Outcome: Progressing   Problem: Clinical Measurements: Goal: Respiratory complications will improve Outcome: Progressing Goal: Cardiovascular complication will be avoided Outcome: Progressing   Problem: Pain Managment: Goal: General experience of comfort will improve and/or be controlled Outcome: Progressing

## 2023-08-17 NOTE — Assessment & Plan Note (Signed)
Multisystem organ failure is present and patient is comfort care only.

## 2023-08-17 NOTE — Progress Notes (Signed)
  Progress Note   Patient: Lucas Fuller YQM:[id number] DOB: 1940/08/02 DOA: 08/15/2023     2 DOS: the patient was seen and examined on 08/17/2023   Brief hospital course: Lucas Fuller is a 83 y.o. 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  Family have confirmed wishes to transition to comfort care measures only.  Assessment and Plan: * Cardiogenic shock [facility]) Patient has not been given any medication for this.  He has times where he is markedly hypotensive. Patient has a EF of 20 to 25% but is not a candidate for any cardiac intervention given his comorbidities  Dementia without behavioral disturbance [facility]) Patient remains pleasantly confused.  AKI (acute kidney injury) (HCC) Multisystem organ failure is present and patient is comfort care only.     Subjective: Patient is awake and alert asked for water this morning.  He is lying comfortably in bed is pleasantly confused.  Physical Exam: Vitals:   08/15/23 2141 08/16/23 0700 08/16/23 2114 08/17/23 0904  BP: (!) 101/54 (!) 86/66 (!) 154/138 96/84  Pulse: 68 62 (!) 25 63  Resp: 19 17 19 13   Temp: 97.7 F (36.5 C) (!) 96 F (35.6 C) (!) 97.5 F (36.4 C) 98.3 F (36.8 C)  TempSrc: Oral Oral Oral Oral  SpO2: 100% 100% 100% 100%  Weight:      Height:       Physical Examination: General appearance - alert, chronically ill appearing, and in no distress Mental status - confused Chest - clear to auscultation, no wheezes, rales or rhonchi, symmetric air entry Heart - normal rate, regular rhythm, normal S1, S2, no murmurs, rubs, clicks or gallops Abdomen - soft, nontender, nondistended, no masses or organomegaly  Data Reviewed: No results found for this or any previous visit (from the past 24 hours).  Family  Communication: No one was present  Disposition: Status is: Inpatient Is medically stable for discharge  Planned Discharge Destination: Skilled nursing facility  Comfort care only  Time spent: 16 minutes  Author: Reva Bores, MD 08/17/2023 11:07 AM  For on call review [email or web address].

## 2023-08-17 NOTE — Evaluation (Signed)
Physical Therapy Evaluation Patient Details Name: Lucas Fuller MRN: [id number] DOB: 03-26-1941 Today's Date: 08/17/2023  History of Present Illness  Lucas Fuller is an 83 y/o male admitted 08/15/23 from [facility] SNF due to an unwitnessed fall. Pt recently was here from 1/7-1/10 with dx of acalculous cholecystitis, fall, hyperbilirubinemia, elevated troponin, and rhabdomyolysis. 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   Clinical Impression  Lucas Fuller is 83 y.o. male admitted with above HPI and diagnosis. Patient is currently limited by functional impairments below (see PT problem list). Patient unreliable historian and PLOF/home setup obtained through chart review. Pt currently requires min assist for bed mobility and transfers and mod cues for sequencing. Pt able to take small side steps along EOB, fatigues quickly. Patient will benefit from continued skilled PT interventions to address impairments and progress independence with mobility. Patient will benefit from continued inpatient follow up therapy, <3 hours/day. Acute PT will follow and progress as able.         If plan is discharge home, recommend the following: A lot of help with walking and/or transfers;A lot of help with bathing/dressing/bathroom;Assistance with cooking/housework;Direct supervision/assist for medications management;Assist for transportation;Help with stairs or ramp for entrance;Supervision due to cognitive status   Can travel by private vehicle   Yes    Equipment Recommendations  (defer to next venue)  Recommendations for Other Services       Functional Status Assessment Patient has had a recent decline in their functional status and/or demonstrates limited ability to make significant improvements in function in a reasonable and predictable amount of time     Precautions / Restrictions  Precautions Precautions: Fall Precaution Comments: dementia Restrictions Weight Bearing Restrictions Per Provider Order: No      Mobility  Bed Mobility Overal bed mobility: Needs Assistance Bed Mobility: Sit to Supine, Supine to Sit     Supine to sit: Min assist Sit to supine: Min assist   General bed mobility comments: cues for sequencing bed rails, min assist to sit upright and stabilize balance. Min assist to return to supine.    Transfers Overall transfer level: Needs assistance Equipment used: Rolling walker (2 wheels) Transfers: Sit to/from Stand Sit to Stand: Min assist           General transfer comment: cues for hand placement and assist to steady with rise. completed 3x sit<>stand, pt with slight ant/post lean requiring assist to correct.    Ambulation/Gait                  Stairs            Wheelchair Mobility     Tilt Bed    Modified Rankin (Stroke Patients Only)       Balance Overall balance assessment: Needs assistance Sitting-balance support: Bilateral upper extremity supported, Feet supported Sitting balance-Leahy Scale: Fair Sitting balance - Comments: able to maintain static sitting balance with supervision                                     Pertinent Vitals/Pain Pain Assessment Pain Assessment: PAINAD Breathing: normal Negative Vocalization: none Facial Expression: smiling or inexpressive Body Language: relaxed Consolability: no need to console PAINAD Score: 0 Pain Intervention(s): Limited activity within patient's tolerance, Monitored during session, Repositioned    Home Living Family/patient expects to be discharged to:: Skilled nursing  facility Living Arrangements: Alone Available Help at Discharge: Family;Available PRN/intermittently (niece Steward Drone checks on pt occasionally) Type of Home: House Home Access: Other (comment);Stairs to enter   [employer] of Steps: 2   Home Layout:  One level Home Equipment: Rollator (4 wheels) Additional Comments: home set up from prior admission, unsure if accurate. per discussion wtihPMT pt's neice unable to provide care for him at home and pt recently at [facility] rehab. Will need SNF rehab and eventual transition to LTC.    Prior Function Prior Level of Function : Patient poor historian/Family not available             Mobility Comments: pt reports he has used RW prieviously but not always.       Extremity/Trunk Assessment   Upper Extremity Assessment Upper Extremity Assessment: Defer to OT evaluation    Lower Extremity Assessment Lower Extremity Assessment: Generalized weakness    Cervical / Trunk Assessment Cervical / Trunk Assessment: Kyphotic  Communication   Communication Communication: Difficulty following commands/understanding Following commands: Follows one step commands with increased time;Follows one step commands consistently;Follows multi-step commands inconsistently Cueing Techniques: Verbal cues  Cognition Arousal: Alert Behavior During Therapy: Flat affect Overall Cognitive Status: History of cognitive impairments - at baseline                                 General Comments: dementia. Pt with impaired initiation for ADLs, decreased STM, and oriented to himself only. slow processing throughout.        General Comments      Exercises     Assessment/Plan    PT Assessment Patient needs continued PT services  PT Problem List Decreased strength;Decreased activity tolerance;Decreased balance;Decreased mobility;Decreased coordination       PT Treatment Interventions DME instruction;Gait training;Stair training;Functional mobility training;Therapeutic activities;Therapeutic exercise;Balance training;Neuromuscular re-education;Patient/family education    PT Goals (Current goals can be found in the Care Plan section)  Acute Rehab PT Goals Patient Stated Goal: did not state PT Goal  Formulation: With patient Time For Goal Achievement: 08/15/23 Potential to Achieve Goals: Fair    Frequency Min 1X/week     Co-evaluation               AM-PAC PT "6 Clicks" Mobility  Outcome Measure Help needed turning from your back to your side while in a flat bed without using bedrails?: A Little Help needed moving from lying on your back to sitting on the side of a flat bed without using bedrails?: A Little Help needed moving to and from a bed to a chair (including a wheelchair)?: A Little Help needed standing up from a chair using your arms (e.g., wheelchair or bedside chair)?: A Little Help needed to walk in hospital room?: A Lot Help needed climbing 3-5 steps with a railing? : Total 6 Click Score: 15    End of Session Equipment Utilized During Treatment: Gait belt Activity Tolerance: Patient tolerated treatment well Patient left: in bed;with call bell/phone within reach;with bed alarm set Nurse Communication: Mobility status PT Visit Diagnosis: Unsteadiness on feet (R26.81);Other abnormalities of gait and mobility (R26.89);Muscle weakness (generalized) (M62.81)    Time: 6045-4098 PT Time Calculation (min) (ACUTE ONLY): 17 min   Charges:   PT Evaluation $PT Eval Moderate Complexity: 1 Mod   PT General Charges $$ ACUTE PT VISIT: 1 Visit         Wynn Maudlin, DPT Acute Rehabilitation Services Office  [phone]  08/17/23 12:38 PM

## 2023-08-17 NOTE — Assessment & Plan Note (Addendum)
Patient has not been given any medication for this.  He has times where he is markedly hypotensive. Patient has a EF of 20 to 25% but is not a candidate for any cardiac intervention given his comorbidities

## 2023-08-17 NOTE — Evaluation (Signed)
Occupational Therapy Evaluation Patient Details Name: Lucas Fuller MRN: [id number] DOB: [birth year] Today's Date: 08/17/2023   History of Present Illness Lucas Fuller is an 83 y/o male admitted 08/15/23 from [facility] SNF due to an unwitnessed fall. Pt recently was here from 1/7-1/10 with dx of acalculous cholecystitis, fall, hyperbilirubinemia, elevated troponin, and rhabdomyolysis. 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   Clinical Impression   Pt presented in bed and agreeable to work with therapist but was unable to report they were in a hospital and what occurred to the pt.  He was able to get to EOB with mod assist and when attempting to transfer pt they wanted to just scoot at the EOB with min-mod assist. Pt then went back into supine with mod assist and noted to have BM on self and required max to total assist to complete care. Patient will benefit from continued inpatient follow up therapy, <3 hours/day.      If plan is discharge home, recommend the following: A lot of help with walking and/or transfers;A lot of help with bathing/dressing/bathroom;Assistance with cooking/housework;Assistance with feeding;Direct supervision/assist for medications management;Direct supervision/assist for financial management;Assist for transportation;Help with stairs or ramp for entrance;Supervision due to cognitive status    Functional Status Assessment  Patient has had a recent decline in their functional status and demonstrates the ability to make significant improvements in function in a reasonable and predictable amount of time.  Equipment Recommendations   (TBD)    Recommendations for Other Services       Precautions / Restrictions Precautions Precautions: Fall Precaution Comments: dementia Restrictions Weight Bearing Restrictions Per Provider Order: No      Mobility  Bed Mobility Overal bed mobility: Needs Assistance Bed Mobility: Sit to Supine, Supine to Sit Rolling: Mod assist   Supine to sit: Mod assist          Transfers Overall transfer level: Needs assistance Equipment used: Rolling walker (2 wheels) Transfers: Sit to/from Stand             General transfer comment: attempted to complete sit to stands but thne they just wanted to scoot at the EOB and then lay back down      Balance Overall balance assessment: Needs assistance Sitting-balance support: Bilateral upper extremity supported, Feet supported Sitting balance-Leahy Scale: Fair Sitting balance - Comments: able to maintain static sitting balance with supervision                                   ADL either performed or assessed with clinical judgement   ADL Overall ADL's : Needs assistance/impaired Eating/Feeding: Independent;Sitting   Grooming: Wash/dry hands;Sitting;Contact guard assist   Upper Body Bathing: Contact guard assist;Sitting   Lower Body Bathing: Maximal assistance;Sitting/lateral leans   Upper Body Dressing : Minimal assistance;Sitting   Lower Body Dressing: Maximal assistance;Sitting/lateral leans       Toileting- Clothing Manipulation and Hygiene: Total assistance;Bed level               Vision         Perception         Praxis         Pertinent Vitals/Pain Pain Assessment Pain Assessment: Faces Faces Pain Scale: Hurts a little bit Breathing: normal Negative Vocalization: none Facial Expression: smiling or inexpressive Body Language: relaxed Consolability: no need to console PAINAD Score:  0 Pain Location: bottoom Pain Descriptors / Indicators: Discomfort Pain Intervention(s): Limited activity within patient's tolerance, Monitored during session, Repositioned     Extremity/Trunk Assessment Upper Extremity Assessment Upper Extremity Assessment: Generalized weakness   Lower Extremity Assessment Lower  Extremity Assessment: Defer to PT evaluation   Cervical / Trunk Assessment Cervical / Trunk Assessment: Kyphotic   Communication Communication Communication: Difficulty following commands/understanding Following commands: Follows one step commands consistently Cueing Techniques: Verbal cues   Cognition Arousal: Alert Behavior During Therapy: Flat affect Overall Cognitive Status: History of cognitive impairments - at baseline                                 General Comments: hx of dementia, decrease self awareness as found to be in [facility]     General Comments       Exercises     Shoulder Instructions      Home Living Family/patient expects to be discharged to:: Skilled nursing facility Living Arrangements: Alone Available Help at Discharge: Family;Available PRN/intermittently Type of Home: House Home Access: Other (comment);Stairs to enter [employer] of Steps: 2 Entrance Stairs-Rails: Right Home Layout: One level     Bathroom Shower/Tub: Tub/shower unit     Bathroom Accessibility: Yes   Home Equipment: Rollator (4 wheels)   Additional Comments: home set up from prior admission, unsure if accurate. per discussion wtihPMT pt's neice unable to provide care for him at home and pt recently at [facility] rehab. Will need SNF rehab and eventual transition to LTC.      Prior Functioning/Environment Prior Level of Function : Patient poor historian/Family not available                        OT Problem List: Decreased strength;Impaired balance (sitting and/or standing);Decreased safety awareness;Decreased cognition      OT Treatment/Interventions: Self-care/ADL training;Therapeutic exercise;Patient/family education;Therapeutic activities;DME and/or AE instruction;Balance training    OT Goals(Current goals can be found in the care plan section) Acute Rehab OT Goals Patient Stated Goal: none OT Goal Formulation: With patient Time For Goal  Achievement: 08/31/23 Potential to Achieve Goals: Good  OT Frequency: Min 1X/week    Co-evaluation              AM-PAC OT "6 Clicks" Daily Activity     Outcome Measure Help from another person eating meals?: A Little Help from another person taking care of personal grooming?: A Little Help from another person toileting, which includes using toliet, bedpan, or urinal?: A Lot Help from another person bathing (including washing, rinsing, drying)?: A Lot Help from another person to put on and taking off regular upper body clothing?: A Little Help from another person to put on and taking off regular lower body clothing?: A Lot 6 Click Score: 15   End of Session Equipment Utilized During Treatment: Gait belt Nurse Communication: Mobility status  Activity Tolerance: Patient limited by fatigue Patient left: in bed;with call bell/phone within reach;with bed alarm set  OT Visit Diagnosis: Unsteadiness on feet (R26.81);Other symptoms and signs involving cognitive function                Time: 0225-0253 OT Time Calculation (min): 28 min Charges:  OT General Charges $OT Visit: 1 Visit OT Evaluation $OT Eval Moderate Complexity: 1 Mod OT Treatments $Self Care/Home Management : 8-22 mins  Presley Raddle OTR/L  Acute Rehab Services  [phone] office number  Alphia Moh 08/17/2023, 5:06 PM

## 2023-08-17 NOTE — Assessment & Plan Note (Signed)
Patient remains pleasantly confused.

## 2023-08-17 NOTE — Hospital Course (Signed)
Patient is an 83 year old male with past medical history significant for chronic HFrEF/BiV failure (EF 20 to 25%), persistent A-fib on Eliquis, HTN, hypothyroidism, history of CVA, history of bladder cancer, dementia who presented to the ED from his SNF after he was found on the floor for undetermined period of time.  Patient was recently admitted on [address] 110 for similar presentation and had been transitioning to hospice care.  Patient was admitted and has been transition to comfort care.

## 2023-08-18 DIAGNOSIS — Z515 Encounter for palliative care: Secondary | ICD-10-CM | POA: Diagnosis not present

## 2023-08-18 DIAGNOSIS — R570 Cardiogenic shock: Secondary | ICD-10-CM | POA: Diagnosis not present

## 2023-08-18 DIAGNOSIS — N179 Acute kidney failure, unspecified: Secondary | ICD-10-CM | POA: Diagnosis not present

## 2023-08-18 DIAGNOSIS — F0390 Unspecified dementia without behavioral disturbance: Secondary | ICD-10-CM | POA: Diagnosis not present

## 2023-08-18 NOTE — Progress Notes (Signed)
Palliative Medicine Progress Note   Patient Name: Lucas Fuller       Date: 08/18/2023 DOB: 09/15/1940  Age: 83 y.o. MRN#: [id number] Attending Physician: Reva Bores, MD Primary Care Physician: Georgann Housekeeper, MD Admit Date: 08/15/2023  Reason for Consultation/Follow-up: {Reason for Consult:23484}  HPI/Patient Profile:  83 y.o. 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  After discussion with admitting hospitalist, family decided to transition to comfort care.    Palliative Medicine was consulted for end-of-life care and "possible transition to hospice".  Subjective: ***  Objective:  Physical Exam          Vital Signs: BP (!) 84/61 (BP Location: Right Arm)   Pulse 63   Temp 97.9 F (36.6 C) (Oral)   Resp 16   Ht 5\' 7"  (1.702 m)   Wt 67 kg   SpO2 91%   BMI 23.13 kg/m  SpO2: SpO2: 91 % O2 Device: O2 Device: Room Air O2 Flow Rate: O2 Flow Rate (L/min): 2 L/min  Intake/output summary:  Intake/Output Summary (Last 24 hours) at 08/18/2023 1225 Last data filed at 08/18/2023 1057 Gross per 24 hour  Intake 725 ml  Output 0 ml  Net 725 ml    LBM: Last BM Date : 08/17/23     Palliative Assessment/Data: ***     Palliative Medicine Assessment & Plan   Assessment: Principal Problem:   Cardiogenic shock [facility]) Active Problems:   Thrombocytopenia (HCC)   AKI (acute kidney injury) (HCC)   Elevated INR   Elevated LFTs   Hypothermia   Atrial fibrillation, chronic (HCC)   Elevated CK   Dementia without behavioral disturbance (HCC)    Recommendations/Plan: ***  Goals of Care  and Additional Recommendations: Limitations on Scope of Treatment: {Recommended Scope and Preferences:21019}  Code Status:   Prognosis:  {Palliative Care Prognosis:23504}  Discharge Planning: {Palliative dispostion:23505}  Care plan was discussed with ***  Thank you for allowing the Palliative Medicine Team to assist in the care of this patient.   ***   Merry Proud, NP   Please contact Palliative Medicine Team phone at [phone] for questions and concerns.  For individual providers, please  see AMION.

## 2023-08-18 NOTE — Progress Notes (Signed)
  Progress Note   Patient: Lucas Fuller WGN:[id number] DOB: [birth year] DOA: 08/15/2023     3 DOS: the patient was seen and examined on 08/18/2023   Brief hospital course: Lucas Fuller is a 83 y.o. 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  Family have confirmed wishes to transition to comfort care measures only.  Assessment and Plan: * Cardiogenic shock [facility]) Patient has not been given any medication for this.  He has times where he is markedly hypotensive. Patient has a EF of 20 to 25% but is not a candidate for any cardiac intervention given his comorbidities  Dementia without behavioral disturbance [facility]) Patient remains pleasantly confused.  AKI (acute kidney injury) (HCC) Multisystem organ failure is present and patient is comfort care only.        Subjective: Patient is pleasantly confused.  He denies specific complaints this morning  Physical Exam: Vitals:   08/16/23 2114 08/17/23 0904 08/17/23 2002 08/18/23 0845  BP: (!) 154/138 96/84 (!) 85/61 (!) 84/61  Pulse: (!) 25 63 (!) 59 63  Resp: 19 13 16 16   Temp: (!) 97.5 F (36.4 C) 98.3 F (36.8 C) (!) 97.4 F (36.3 C) 97.9 F (36.6 C)  TempSrc: Oral Oral Oral Oral  SpO2: 100% 100% 91% 91%  Weight:      Height:       Physical Examination: General appearance - alert, chronically ill appearing, and in no distress Chest - clear to auscultation, no wheezes, rales or rhonchi, symmetric air entry Heart - normal rate, regular rhythm, normal S1, S2, no murmurs, rubs, clicks or gallops Abdomen - soft, nontender, nondistended, no masses or organomegaly  Data Reviewed: No results found for this or any previous visit (from the past 24 hours).  Family Communication: None  Disposition: Status is: Inpatient Patient  is stable for discharge to SNF, awaiting placement  Planned Discharge Destination: Skilled nursing facility DVT prophylaxis: Patient is comfort care only  Time spent: 26 minutes  Author: Reva Bores, MD 08/18/2023 10:18 AM  For on call review [email or web address].

## 2023-08-19 DIAGNOSIS — R570 Cardiogenic shock: Secondary | ICD-10-CM | POA: Diagnosis not present

## 2023-08-19 DIAGNOSIS — R7989 Other specified abnormal findings of blood chemistry: Secondary | ICD-10-CM

## 2023-08-19 DIAGNOSIS — T68XXXA Hypothermia, initial encounter: Secondary | ICD-10-CM

## 2023-08-19 DIAGNOSIS — Z515 Encounter for palliative care: Secondary | ICD-10-CM | POA: Diagnosis not present

## 2023-08-19 DIAGNOSIS — I5023 Acute on chronic systolic (congestive) heart failure: Secondary | ICD-10-CM | POA: Diagnosis not present

## 2023-08-19 DIAGNOSIS — R791 Abnormal coagulation profile: Secondary | ICD-10-CM

## 2023-08-19 DIAGNOSIS — R748 Abnormal levels of other serum enzymes: Secondary | ICD-10-CM

## 2023-08-19 DIAGNOSIS — D696 Thrombocytopenia, unspecified: Secondary | ICD-10-CM

## 2023-08-19 DIAGNOSIS — N179 Acute kidney failure, unspecified: Secondary | ICD-10-CM | POA: Diagnosis not present

## 2023-08-19 NOTE — Progress Notes (Signed)
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  After goal of care discussion between admitting provider and patient's family, he was transition to full comfort measures, and admitted for end-of-life care.  Patient to return to SNF with palliative when SNF is able to take him back.  Palliative medicine following.    Subjective: Seen and examined earlier this morning.  No major events overnight of this morning.  Sleeping.  Not in distress.  No family member at bedside.  Objective: Vitals:   08/18/23 0845 08/18/23 1612 08/18/23 2040 08/19/23 0833  BP: (!) 84/61 (!) 89/54 (!) 90/52 (!) 76/55  Pulse: 63 (!) 57 63 (!) 53  Resp: 16 16 19 18   Temp: 97.9 F (36.6 C) (!) 97.3 F (36.3 C) (!) 97.4 F (36.3 C) 98 F (36.7 C)  TempSrc: Oral Oral Axillary   SpO2: 91% 97% (!) 76% 100%  Weight:      Height:        Examination:  GENERAL: Sleeping.  No apparent distress.  Frail. RESP:  No IWOB.  Fair aeration bilaterally. CVS:  RRR. Heart sounds normal.  ABD/GI/GU: BS+. Abd soft, NTND.  MSK/EXT: No apparent deformity. NEURO: Sleepy.  No apparent focal neuro deficit. PSYCH: No distress agitation.  Procedures:  None  Microbiology summarized: COVID-19, influenza and RSV PCR nonreactive Blood cultures NGTD  Assessment and plan: End-of-life care-appreciate help by palliative medicine Acute on chronic HFrEF/BiV failure with cardiogenic  shock Acute respiratory failure with hypoxia Hypotension/lactic acidosis Shock liver/acute thrombocytopenia/coagulopathy Acute kidney injury Hypothermia Coagulopathy Elevated CK/traumatic rhabdomyolysis Chronic atrial fibrillation Severe dementia without behavioral disturbance Unwitnessed fall at SNF  Body mass index is 23.13 kg/m.  Pressure skin injury: POA Pressure Injury 08/17/23 Sacrum Posterior Stage 2 -  Partial thickness loss of dermis presenting as a shallow open injury with a red, pink wound bed without slough. noted sacral wounds (Active)  08/17/23 0800  Location: Sacrum  Location Orientation: Posterior  Staging: Stage 2 -  Partial thickness loss of dermis presenting as a shallow open injury with a red, pink wound bed without slough.  Wound Description (Comments): noted sacral wounds  Present on Admission: No  Dressing Type Foam - Lift dressing to assess site every shift 08/18/23 2015   DVT prophylaxis:  Patient is full comfort care  Code Status: DNR/DNI-comfort Family Communication: None at bedside Level of care: Palliative Care Status is: Inpatient Remains inpatient appropriate because: End-of-life care   Final disposition: SNF with palliative Consultants:  Palliative medicine  35 minutes with more than 50% spent in reviewing records, counseling patient/family and coordinating care.   Sch Meds:  Scheduled Meds:  feeding supplement  237 mL Oral BID BM   Continuous Infusions: PRN Meds:.acetaminophen **OR** acetaminophen, antiseptic oral rinse, chlorproMAZINE, glycopyrrolate **OR** glycopyrrolate **OR** glycopyrrolate, haloperidol **OR** haloperidol **OR** haloperidol lactate, HYDROmorphone (DILAUDID) injection, loperamide, LORazepam **OR** LORazepam **OR** LORazepam, ondansetron **OR** ondansetron (ZOFRAN) IV, oxyCODONE, polyvinyl alcohol, senna  Antimicrobials:  Anti-infectives (From admission, onward)    Start     Dose/Rate Route Frequency Ordered Stop    08/15/23 0645  cefTRIAXone (ROCEPHIN) 1 g in sodium chloride 0.9 % 100 mL IVPB        1 g 200 mL/hr over 30 Minutes Intravenous  Once 08/15/23 1610 08/15/23 0755        I have personally reviewed the following labs and images: CBC: Recent Labs  Lab 08/15/23 0645  WBC 7.0  NEUTROABS 5.6  HGB 12.9*  HCT 38.4*  MCV 100.8*  PLT 60*   BMP &GFR Recent Labs  Lab 08/15/23 0800  NA 129*  K 4.4  CL 99  CO2 15*  GLUCOSE 80  BUN 42*  CREATININE 1.54*  CALCIUM 8.5*   Estimated Creatinine Clearance: 34 mL/min (A) (by C-G formula based on SCr of 1.54 mg/dL (H)). Liver & Pancreas: Recent Labs  Lab 08/15/23 0800  AST 209*  ALT 129*  ALKPHOS 68  BILITOT 3.6*  PROT 6.0*  ALBUMIN 2.6*   No results for input(s): "LIPASE", "AMYLASE" in the last 168 hours. No results for input(s): "AMMONIA" in the last 168 hours. Diabetic: No results for input(s): "HGBA1C" in the last 72 hours. No results for input(s): "GLUCAP" in the last 168 hours. Cardiac Enzymes: Recent Labs  Lab 08/15/23 0750  CKTOTAL 1,014*   No results for input(s): "PROBNP" in the last 8760 hours. Coagulation Profile: Recent Labs  Lab 08/15/23 1055  INR >10.0*   Thyroid Function Tests: No results for input(s): "TSH", "T4TOTAL", "FREET4", "T3FREE", "THYROIDAB" in the last 72 hours. Lipid Profile: No results for input(s): "CHOL", "HDL", "LDLCALC", "TRIG", "CHOLHDL", "LDLDIRECT" in the last 72 hours. Anemia Panel: No results for input(s): "VITAMINB12", "FOLATE", "FERRITIN", "TIBC", "IRON", "RETICCTPCT" in the last 72 hours. Urine analysis:    Component Value Date/Time   COLORURINE AMBER (A) 08/15/2023 0720   APPEARANCEUR CLEAR 08/15/2023 0720   LABSPEC 1.014 08/15/2023 0720   PHURINE 5.0 08/15/2023 0720   GLUCOSEU NEGATIVE 08/15/2023 0720   HGBUR MODERATE (A) 08/15/2023 0720   BILIRUBINUR NEGATIVE 08/15/2023 0720   KETONESUR NEGATIVE 08/15/2023 0720   PROTEINUR 30 (A) 08/15/2023 0720   UROBILINOGEN  0.2 06/22/2009 0723   NITRITE NEGATIVE 08/15/2023 0720   LEUKOCYTESUR NEGATIVE 08/15/2023 0720   Sepsis Labs: Invalid input(s): "PROCALCITONIN", "LACTICIDVEN"  Microbiology: Recent Results (from the past 240 hours)  Blood Culture (routine x 2)     Status: None (Preliminary result)   Collection Time: 08/15/23  6:31 AM   Specimen: BLOOD RIGHT WRIST  Result Value Ref Range Status   Specimen Description BLOOD RIGHT WRIST  Final   Special Requests   Final    BOTTLES DRAWN AEROBIC AND ANAEROBIC Blood Culture results may not be optimal due to an inadequate volume of blood received in culture bottles   Culture   Final    NO GROWTH 4 DAYS Performed at [facility], [address] [place], [place] [address]    Report Status PENDING  Incomplete  Blood Culture (routine x 2)     Status: None (Preliminary result)   Collection Time: 08/15/23  6:36 AM   Specimen: BLOOD RIGHT HAND  Result Value Ref Range Status   Specimen Description BLOOD RIGHT HAND  Final   Special Requests   Final    BOTTLES DRAWN AEROBIC ONLY Blood Culture results may not be optimal due to an inadequate volume of blood received in culture bottles   Culture   Final  NO GROWTH 4 DAYS Performed at [facility], [address] [place], [place] [address]    Report Status PENDING  Incomplete  Resp panel by RT-PCR (RSV, Flu A&B, Covid) Anterior Nasal Swab     Status: None   Collection Time: 08/15/23 10:01 AM   Specimen: Anterior Nasal Swab  Result Value Ref Range Status   SARS Coronavirus 2 by RT PCR NEGATIVE NEGATIVE Final   Influenza A by PCR NEGATIVE NEGATIVE Final   Influenza B by PCR NEGATIVE NEGATIVE Final    Comment: (NOTE) The Xpert Xpress SARS-CoV-2/FLU/RSV plus assay is intended as an aid in the diagnosis of influenza from Nasopharyngeal swab specimens and should not be used as a sole basis for treatment. Nasal washings and aspirates are unacceptable for Xpert Xpress  SARS-CoV-2/FLU/RSV testing.  Fact Sheet for Patients: BloggerCourse.com  Fact Sheet for Healthcare Providers: SeriousBroker.it  This test is not yet approved or cleared by the [place] FDA and has been authorized for detection and/or diagnosis of SARS-CoV-2 by FDA under an Emergency Use Authorization (EUA). This EUA will remain in effect (meaning this test can be used) for the duration of the COVID-19 declaration under Section 564(b)(1) of the Act, 21 U.S.C. section 360bbb-3(b)(1), unless the authorization is terminated or revoked.     Resp Syncytial Virus by PCR NEGATIVE NEGATIVE Final    Comment: (NOTE) Fact Sheet for Patients: BloggerCourse.com  Fact Sheet for Healthcare Providers: SeriousBroker.it  This test is not yet approved or cleared by the [place] FDA and has been authorized for detection and/or diagnosis of SARS-CoV-2 by FDA under an Emergency Use Authorization (EUA). This EUA will remain in effect (meaning this test can be used) for the duration of the COVID-19 declaration under Section 564(b)(1) of the Act, 21 U.S.C. section 360bbb-3(b)(1), unless the authorization is terminated or revoked.  Performed at [facility], [address], [place], [place] [address]     Radiology Studies: No results found.    Rodina Pinales T. Refugio Mcconico [facility]  If 7PM-7AM, please contact night-coverage [email or web address] 08/19/2023, 11:51 AM

## 2023-08-19 NOTE — TOC Progression Note (Addendum)
Transition of Care [facility]) - Progression Note    Patient Details  Name: GALE HULSE MRN: [id number] Date of Birth: [birth year]  Transition of Care [facility]) CM/SW Contact  Marliss Coots, LCSW Phone Number: 08/19/2023, 10:22 AM  Clinical Narrative:     10:22 AM CSW submitted insurance authorization request [id number]). Authorization is currently pending.  11:43 AM Request has been approved and is valid 08/19/23-08/21/23. CSW informed medical team and [facility] of approval. [facility] admissions informed CSW that they do not have bed availability today but may tomorrow.  Expected Discharge Plan: Skilled Nursing Facility Barriers to Discharge: Continued Medical Work up, English as a second language teacher  Expected Discharge Plan and Services In-house Referral: Clinical Social Work, Hospice / Palliative Care     Living arrangements for the past 2 months: Single Family Home, Skilled Nursing Facility                                       Social Determinants of Health (SDOH) Interventions SDOH Screenings   Food Insecurity: Patient Unable To Answer (08/16/2023)  Housing: Patient Unable To Answer (08/16/2023)  Transportation Needs: Patient Unable To Answer (08/16/2023)  Utilities: Patient Unable To Answer (08/16/2023)  Alcohol Screen: Low Risk  (11/10/2021)  Social Connections: Patient Unable To Answer (08/16/2023)  Tobacco Use: Low Risk  (08/15/2023)    Readmission Risk Interventions     No data to display

## 2023-08-19 NOTE — Plan of Care (Signed)
  Problem: Respiratory: Goal: Verbalizations of increased ease of respirations will increase Outcome: Completed/Met   Problem: Clinical Measurements: Goal: Quality of life will improve Outcome: Completed/Met   Problem: Pain Management: Goal: Satisfaction with pain management regimen will improve Outcome: Not Progressing

## 2023-08-20 DIAGNOSIS — R570 Cardiogenic shock: Secondary | ICD-10-CM | POA: Diagnosis not present

## 2023-08-20 DIAGNOSIS — I5023 Acute on chronic systolic (congestive) heart failure: Secondary | ICD-10-CM | POA: Diagnosis not present

## 2023-08-20 DIAGNOSIS — N179 Acute kidney failure, unspecified: Secondary | ICD-10-CM | POA: Diagnosis not present

## 2023-08-20 DIAGNOSIS — Z515 Encounter for palliative care: Secondary | ICD-10-CM | POA: Diagnosis not present

## 2023-08-20 LAB — CULTURE, BLOOD (ROUTINE X 2)
Culture: NO GROWTH
Culture: NO GROWTH

## 2023-08-20 MED ORDER — GLYCOPYRROLATE 1 MG/5ML PO SOLN: 1.0000 mg | Freq: Four times a day (QID) | ORAL | 0 refills | Status: AC | PRN

## 2023-08-20 MED ORDER — ACETAMINOPHEN 325 MG PO TABS: 650.0000 mg | ORAL_TABLET | Freq: Four times a day (QID) | ORAL | Status: AC | PRN

## 2023-08-20 MED ORDER — ONDANSETRON 4 MG PO TBDP: 4.0000 mg | ORAL_TABLET | Freq: Three times a day (TID) | ORAL | 0 refills | Status: AC | PRN

## 2023-08-20 MED ORDER — MORPHINE SULFATE (CONCENTRATE) 10 MG /0.5 ML PO SOLN: 10.0000 mg | ORAL | 0 refills | Status: AC | PRN

## 2023-08-20 MED ORDER — LORAZEPAM 0.5 MG PO TABS: 0.5000 mg | ORAL_TABLET | ORAL | 0 refills | Status: AC | PRN

## 2023-08-20 NOTE — Progress Notes (Signed)
Physical Therapy Treatment Patient Details Name: Lucas Fuller MRN: [id number] DOB: 12-18-1940 Today's Date: 08/20/2023   History of Present Illness Lucas Fuller is an 83 y/o male admitted 08/15/23 from [facility] SNF due to an unwitnessed fall. Pt recently was here from 1/7-1/10 with dx of acalculous cholecystitis, fall, hyperbilirubinemia, elevated troponin, and rhabdomyolysis. 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    PT Comments  Patient received in bed. Minimal verbalizations. Not following direction for exercises or bed mobility. Requiring total assist at this time to move toward edge of bed. Patient will not be able to to transport via personal car at this time. He will continue to benefit from skilled PT to maximize independence.    If plan is discharge home, recommend the following: Two people to help with walking and/or transfers;A lot of help with bathing/dressing/bathroom;Assist for transportation   Can travel by private vehicle     No  Equipment Recommendations  None recommended by PT    Recommendations for Other Services       Precautions / Restrictions Precautions Precautions: Fall Precaution Comments: dementia Restrictions Weight Bearing Restrictions Per Provider Order: No     Mobility  Bed Mobility Overal bed mobility: Needs Assistance Bed Mobility: Rolling, Supine to Sit Rolling: Total assist   Supine to sit: Total assist     General bed mobility comments: Patient made no effor to move at all toward edge of bed. Requires total A at this time    Transfers                   General transfer comment: no able to attempt    Ambulation/Gait                   Stairs             Wheelchair Mobility     Tilt Bed    Modified Rankin (Stroke Patients Only)       Balance Overall balance assessment: Needs  assistance   Sitting balance-Leahy Scale: Poor                                      Cognition Arousal: Alert Behavior During Therapy: Flat affect Overall Cognitive Status: History of cognitive impairments - at baseline                                          Exercises Total Joint Exercises Ankle Circles/Pumps: PROM, Both, 5 reps Heel Slides: PROM, Both, 5 reps    General Comments        Pertinent Vitals/Pain Pain Assessment Pain Assessment: PAINAD Breathing: normal Negative Vocalization: none Facial Expression: smiling or inexpressive Body Language: relaxed Consolability: no need to console PAINAD Score: 0 Pain Intervention(s): Monitored during session    Home Living                          Prior Function            PT Goals (current goals can now be found in the care plan section) Acute Rehab PT Goals Patient Stated Goal: did not state PT Goal Formulation: Patient unable to participate in goal setting Time For Goal Achievement: 08/15/23  Progress towards PT goals: Not progressing toward goals - comment    Frequency    Min 1X/week      PT Plan      Co-evaluation              AM-PAC PT "6 Clicks" Mobility   Outcome Measure  Help needed turning from your back to your side while in a flat bed without using bedrails?: Total Help needed moving from lying on your back to sitting on the side of a flat bed without using bedrails?: Total Help needed moving to and from a bed to a chair (including a wheelchair)?: Total Help needed standing up from a chair using your arms (e.g., wheelchair or bedside chair)?: Total Help needed to walk in hospital room?: Total Help needed climbing 3-5 steps with a railing? : Total 6 Click Score: 6    End of Session Equipment Utilized During Treatment: Oxygen Activity Tolerance: Other (comment) (cognition, motivation?) Patient left: in bed;with call bell/phone within  reach;with bed alarm set Nurse Communication: Mobility status PT Visit Diagnosis: Other abnormalities of gait and mobility (R26.89);Muscle weakness (generalized) (M62.81)     Time: 1052-1100 PT Time Calculation (min) (ACUTE ONLY): 8 min  Charges:    $Therapeutic Activity: 8-22 mins PT General Charges $$ ACUTE PT VISIT: 1 Visit                     Kielan Dreisbach, PT, GCS 08/20/23,11:03 AM

## 2023-08-20 NOTE — Progress Notes (Signed)
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  After goal of care discussion between admitting provider and patient's family, he was transition to full comfort measures, and admitted for end-of-life care. Palliative medicine following.Patient to return to SNF with palliative when SNF is able to take him back, likely on 1/30 per TOC.    Subjective: Seen and examined earlier this morning.  No major events overnight of this morning.  Sleeping.  Not in distress.  No family member at bedside.  Objective: Vitals:   08/18/23 2040 08/19/23 0833 08/19/23 2001 08/20/23 0451  BP: (!) 90/52 (!) 76/55 (!) 70/56 (!) 87/62  Pulse: 63 (!) 53 (!) 56 (!) 59  Resp: 19 18 16    Temp: (!) 97.4 F (36.3 C) 98 F (36.7 C) 97.7 F (36.5 C) 98.1 F (36.7 C)  TempSrc: Axillary  Axillary Axillary  SpO2: (!) 76% 100% 100% 100%  Weight:      Height:        Examination:  GENERAL: Sleeping.  No apparent distress.  Frail. RESP:  No IWOB.  Fair aeration bilaterally. CVS:  RRR. Heart sounds normal.  ABD/GI/GU: BS+. Abd soft, NTND.  MSK/EXT: No apparent deformity. NEURO: Sleepy.  No apparent focal neuro deficit. PSYCH: No distress agitation.  Procedures:  None  Microbiology summarized: COVID-19, influenza and RSV PCR nonreactive Blood cultures NGTD  Assessment and plan: End-of-life care-appreciate help by palliative medicine Acute on chronic  HFrEF/BiV failure with cardiogenic shock Acute respiratory failure with hypoxia Hypotension/lactic acidosis Shock liver/acute thrombocytopenia/coagulopathy Acute kidney injury Hypothermia Coagulopathy Elevated CK/traumatic rhabdomyolysis Chronic atrial fibrillation Severe dementia without behavioral disturbance Unwitnessed fall at SNF  Body mass index is 23.13 kg/m.  Pressure skin injury: POA Pressure Injury 08/17/23 Sacrum Posterior Stage 2 -  Partial thickness loss of dermis presenting as a shallow open injury with a red, pink wound bed without slough. noted sacral wounds (Active)  08/17/23 0800  Location: Sacrum  Location Orientation: Posterior  Staging: Stage 2 -  Partial thickness loss of dermis presenting as a shallow open injury with a red, pink wound bed without slough.  Wound Description (Comments): noted sacral wounds  Present on Admission: No  Dressing Type Foam - Lift dressing to assess site every shift 08/20/23 0828   DVT prophylaxis:  Patient is full comfort care  Code Status: DNR/DNI-comfort Family Communication: None at bedside Level of care: Palliative Care Status is: Inpatient Remains inpatient appropriate because: End-of-life care   Final disposition: SNF with palliative Consultants:  Palliative medicine  35 minutes with more than 50% spent in reviewing records, counseling patient/family and coordinating care.   Sch Meds:  Scheduled Meds:  feeding supplement  237 mL Oral BID BM   Continuous Infusions: PRN Meds:.acetaminophen **OR** acetaminophen, antiseptic oral rinse, chlorproMAZINE, glycopyrrolate **OR** glycopyrrolate **OR** glycopyrrolate, haloperidol **OR** haloperidol **OR** haloperidol lactate, HYDROmorphone (DILAUDID) injection, loperamide, LORazepam **OR** LORazepam **OR** LORazepam, ondansetron **OR** ondansetron (ZOFRAN) IV, oxyCODONE, polyvinyl alcohol, senna  Antimicrobials: Anti-infectives (From admission, onward)    Start      Dose/Rate Route Frequency Ordered Stop   08/15/23 0645  cefTRIAXone (ROCEPHIN) 1 g in sodium chloride 0.9 % 100 mL IVPB        1 g 200 mL/hr over 30 Minutes Intravenous  Once 08/15/23 9562 08/15/23 0755        I have personally reviewed the following labs and images: CBC: Recent Labs  Lab 08/15/23 0645  WBC 7.0  NEUTROABS 5.6  HGB 12.9*  HCT 38.4*  MCV 100.8*  PLT 60*   BMP &GFR Recent Labs  Lab 08/15/23 0800  NA 129*  K 4.4  CL 99  CO2 15*  GLUCOSE 80  BUN 42*  CREATININE 1.54*  CALCIUM 8.5*   Estimated Creatinine Clearance: 34 mL/min (A) (by C-G formula based on SCr of 1.54 mg/dL (H)). Liver & Pancreas: Recent Labs  Lab 08/15/23 0800  AST 209*  ALT 129*  ALKPHOS 68  BILITOT 3.6*  PROT 6.0*  ALBUMIN 2.6*   No results for input(s): "LIPASE", "AMYLASE" in the last 168 hours. No results for input(s): "AMMONIA" in the last 168 hours. Diabetic: No results for input(s): "HGBA1C" in the last 72 hours. No results for input(s): "GLUCAP" in the last 168 hours. Cardiac Enzymes: Recent Labs  Lab 08/15/23 0750  CKTOTAL 1,014*   No results for input(s): "PROBNP" in the last 8760 hours. Coagulation Profile: Recent Labs  Lab 08/15/23 1055  INR >10.0*   Thyroid Function Tests: No results for input(s): "TSH", "T4TOTAL", "FREET4", "T3FREE", "THYROIDAB" in the last 72 hours. Lipid Profile: No results for input(s): "CHOL", "HDL", "LDLCALC", "TRIG", "CHOLHDL", "LDLDIRECT" in the last 72 hours. Anemia Panel: No results for input(s): "VITAMINB12", "FOLATE", "FERRITIN", "TIBC", "IRON", "RETICCTPCT" in the last 72 hours. Urine analysis:    Component Value Date/Time   COLORURINE AMBER (A) 08/15/2023 0720   APPEARANCEUR CLEAR 08/15/2023 0720   LABSPEC 1.014 08/15/2023 0720   PHURINE 5.0 08/15/2023 0720   GLUCOSEU NEGATIVE 08/15/2023 0720   HGBUR MODERATE (A) 08/15/2023 0720   BILIRUBINUR NEGATIVE 08/15/2023 0720   KETONESUR NEGATIVE 08/15/2023 0720   PROTEINUR 30  (A) 08/15/2023 0720   UROBILINOGEN 0.2 06/22/2009 0723   NITRITE NEGATIVE 08/15/2023 0720   LEUKOCYTESUR NEGATIVE 08/15/2023 0720   Sepsis Labs: Invalid input(s): "PROCALCITONIN", "LACTICIDVEN"  Microbiology: Recent Results (from the past 240 hours)  Blood Culture (routine x 2)     Status: None   Collection Time: 08/15/23  6:31 AM   Specimen: BLOOD RIGHT WRIST  Result Value Ref Range Status   Specimen Description BLOOD RIGHT WRIST  Final   Special Requests   Final    BOTTLES DRAWN AEROBIC AND ANAEROBIC Blood Culture results may not be optimal due to an inadequate volume of blood received in culture bottles   Culture   Final    NO GROWTH 5 DAYS Performed at [facility], [address] [place], [place] [address]    Report Status 08/20/2023 FINAL  Final  Blood Culture (routine x 2)     Status: None   Collection Time: 08/15/23  6:36 AM   Specimen: BLOOD RIGHT HAND  Result Value Ref Range Status   Specimen Description BLOOD RIGHT HAND  Final   Special Requests   Final    BOTTLES DRAWN AEROBIC ONLY Blood Culture results may not be optimal due to an inadequate volume of blood received in culture bottles   Culture   Final  NO GROWTH 5 DAYS Performed at [facility], [address] [place], [place] [address]    Report Status 08/20/2023 FINAL  Final  Resp panel by RT-PCR (RSV, Flu A&B, Covid) Anterior Nasal Swab     Status: None   Collection Time: 08/15/23 10:01 AM   Specimen: Anterior Nasal Swab  Result Value Ref Range Status   SARS Coronavirus 2 by RT PCR NEGATIVE NEGATIVE Final   Influenza A by PCR NEGATIVE NEGATIVE Final   Influenza B by PCR NEGATIVE NEGATIVE Final    Comment: (NOTE) The Xpert Xpress SARS-CoV-2/FLU/RSV plus assay is intended as an aid in the diagnosis of influenza from Nasopharyngeal swab specimens and should not be used as a sole basis for treatment. Nasal washings and aspirates are unacceptable for Xpert Xpress  SARS-CoV-2/FLU/RSV testing.  Fact Sheet for Patients: BloggerCourse.com  Fact Sheet for Healthcare Providers: SeriousBroker.it  This test is not yet approved or cleared by the [place] FDA and has been authorized for detection and/or diagnosis of SARS-CoV-2 by FDA under an Emergency Use Authorization (EUA). This EUA will remain in effect (meaning this test can be used) for the duration of the COVID-19 declaration under Section 564(b)(1) of the Act, 21 U.S.C. section 360bbb-3(b)(1), unless the authorization is terminated or revoked.     Resp Syncytial Virus by PCR NEGATIVE NEGATIVE Final    Comment: (NOTE) Fact Sheet for Patients: BloggerCourse.com  Fact Sheet for Healthcare Providers: SeriousBroker.it  This test is not yet approved or cleared by the [place] FDA and has been authorized for detection and/or diagnosis of SARS-CoV-2 by FDA under an Emergency Use Authorization (EUA). This EUA will remain in effect (meaning this test can be used) for the duration of the COVID-19 declaration under Section 564(b)(1) of the Act, 21 U.S.C. section 360bbb-3(b)(1), unless the authorization is terminated or revoked.  Performed at [facility], [address], [place], [place] [address]     Radiology Studies: No results found.    Shakeena Kafer T. Deavion Dobbs [facility]  If 7PM-7AM, please contact night-coverage [email or web address] 08/20/2023, 4:01 PM

## 2023-08-20 NOTE — Plan of Care (Signed)
Problem: Education: Goal: Knowledge of the prescribed therapeutic regimen will improve Outcome: Progressing   Problem: Clinical Measurements: Goal: Quality of life will improve Outcome: Progressing   Problem: Role Relationship: Goal: Family's ability to cope with current situation will improve Outcome: Progressing   Problem: Pain Management: Goal: Satisfaction with pain management regimen will improve Outcome: Progressing

## 2023-08-20 NOTE — TOC Progression Note (Signed)
Transition of Care [facility]) - Progression Note    Patient Details  Name: Lucas Fuller MRN: [id number] Date of Birth: [birth year]  Transition of Care [facility]) CM/SW Contact  Marliss Coots, LCSW Phone Number: 08/20/2023, 1:00 PM  Clinical Narrative:     1:00 PM Per [facility], there is a possibility of bed availability for patient discharge Thursday, 08/22/2023.  Expected Discharge Plan: Skilled Nursing Facility Barriers to Discharge: Continued Medical Work up, English as a second language teacher  Expected Discharge Plan and Services In-house Referral: Clinical Social Work, Hospice / Palliative Care     Living arrangements for the past 2 months: Single Family Home, Skilled Nursing Facility Expected Discharge Date: 08/20/23                                     Social Determinants of Health (SDOH) Interventions SDOH Screenings   Food Insecurity: Patient Unable To Answer (08/16/2023)  Housing: Patient Unable To Answer (08/16/2023)  Transportation Needs: Patient Unable To Answer (08/16/2023)  Utilities: Patient Unable To Answer (08/16/2023)  Alcohol Screen: Low Risk  (11/10/2021)  Social Connections: Patient Unable To Answer (08/16/2023)  Tobacco Use: Low Risk  (08/15/2023)    Readmission Risk Interventions     No data to display

## 2023-08-20 NOTE — Consult Note (Signed)
[facility] Liaison Consult Note   08/20/2023  Lucas Fuller 1941/01/29 [id number]  [facility] [[facility]] Consult:  Readmission less than 30 days   Primary Care Provider:  Georgann Housekeeper, MD, [facility] physician  Rounded on patient admitted to [facility] from SNF.  Insurance: [employer]  Patient was reviewed for readmission Medium score 5 day length of stay post hospital barriers to care  Patient was screened for hospitalization and on behalf of [facility]  Care Coordination to assess for post hospital community care needs.  Patient is being considered for a skilled nursing facility level of care for post hospital transition.  Patient to be followed by [facility] Transition team however, if patient goes to a [facility] affiliated facility then can request for VBCI RN [facility] to follow up for needs. Currently, for comfort measures  If the patient goes to a VBCI affiliated facility then, patient can be followed by [facility] RN with traditional Medicare and approved Medicare Advantage plans.    Plan:  If transitions to affiliated facility, then will notify the Community [facility] RN can follow for any known or needs for transitional care needs for returning to post facility care coordination needs to return to community.  For questions or referrals, please contact:  Charlesetta Shanks, RN, BSN, CCM [place]  [facility], [facility] Liaison Direct Dial: [phone] or secure chat Email: [email or web address]

## 2023-08-21 DIAGNOSIS — R748 Abnormal levels of other serum enzymes: Secondary | ICD-10-CM | POA: Diagnosis not present

## 2023-08-21 DIAGNOSIS — I5023 Acute on chronic systolic (congestive) heart failure: Secondary | ICD-10-CM | POA: Diagnosis not present

## 2023-08-21 DIAGNOSIS — N179 Acute kidney failure, unspecified: Secondary | ICD-10-CM | POA: Diagnosis not present

## 2023-08-21 DIAGNOSIS — Z515 Encounter for palliative care: Secondary | ICD-10-CM | POA: Diagnosis not present

## 2023-08-21 NOTE — Progress Notes (Signed)
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  After goal of care discussion between admitting provider and patient's family, he was transition to full comfort measures, and admitted for end-of-life care. Palliative medicine following.Patient to return to SNF with palliative when SNF is able to take him back, likely on 1/30 per TOC.    Subjective: Seen and examined earlier this morning.  No major events overnight of this morning.  Sleeping.  Not in distress.  No family member at bedside.  Objective: Vitals:   08/19/23 2001 08/20/23 0451 08/20/23 2036 08/21/23 0802  BP: (!) 70/56 (!) 87/62 93/71 (!) 87/68  Pulse: (!) 56 (!) 59 69 63  Resp: 16  19 18   Temp: 97.7 F (36.5 C) 98.1 F (36.7 C)  98 F (36.7 C)  TempSrc: Axillary Axillary    SpO2: 100% 100% 100% 100%  Weight:      Height:        Examination:  GENERAL: Sleeping.  No apparent distress.  Frail. RESP:  No IWOB.  Fair aeration bilaterally. CVS:  RRR. Heart sounds normal.  ABD/GI/GU: BS+. Abd soft, NTND.  MSK/EXT: No apparent deformity. NEURO: Sleepy.  No apparent focal neuro deficit. PSYCH: No distress agitation.  Procedures:  None  Microbiology summarized: COVID-19, influenza and RSV PCR nonreactive Blood cultures NGTD  Assessment and plan: End-of-life care-appreciate help by palliative medicine Acute on chronic HFrEF/BiV failure with cardiogenic shock Acute  respiratory failure with hypoxia Hypotension/lactic acidosis Shock liver/acute thrombocytopenia/coagulopathy Acute kidney injury Hypothermia Coagulopathy Elevated CK/traumatic rhabdomyolysis Chronic atrial fibrillation Severe dementia without behavioral disturbance Unwitnessed fall at SNF  Body mass index is 23.13 kg/m.  Pressure skin injury: POA Pressure Injury 08/17/23 Sacrum Posterior Stage 2 -  Partial thickness loss of dermis presenting as a shallow open injury with a red, pink wound bed without slough. noted sacral wounds (Active)  08/17/23 0800  Location: Sacrum  Location Orientation: Posterior  Staging: Stage 2 -  Partial thickness loss of dermis presenting as a shallow open injury with a red, pink wound bed without slough.  Wound Description (Comments): noted sacral wounds  Present on Admission: No  Dressing Type Foam - Lift dressing to assess site every shift 08/21/23 0802   DVT prophylaxis:  Patient is full comfort care  Code Status: DNR/DNI-comfort Family Communication: None at bedside Level of care: Palliative Care Status is: Inpatient Remains inpatient appropriate because: End-of-life care   Final disposition: SNF with palliative Consultants:  Palliative medicine  25 minutes with more than 50% spent in reviewing records, counseling patient/family and coordinating care.   Sch Meds:  Scheduled Meds:  feeding supplement  237 mL Oral BID BM   Continuous Infusions: PRN Meds:.acetaminophen **OR** acetaminophen, antiseptic oral rinse, chlorproMAZINE, glycopyrrolate **OR** glycopyrrolate **OR** glycopyrrolate, haloperidol **OR** haloperidol **OR** haloperidol lactate, HYDROmorphone (DILAUDID) injection, loperamide, LORazepam **OR** LORazepam **OR** LORazepam, ondansetron **OR** ondansetron (ZOFRAN) IV, oxyCODONE, polyvinyl alcohol, senna  Antimicrobials: Anti-infectives (From admission, onward)  Start     Dose/Rate Route Frequency Ordered Stop   08/15/23  0645  cefTRIAXone (ROCEPHIN) 1 g in sodium chloride 0.9 % 100 mL IVPB        1 g 200 mL/hr over 30 Minutes Intravenous  Once 08/15/23 1610 08/15/23 0755        I have personally reviewed the following labs and images: CBC: Recent Labs  Lab 08/15/23 0645  WBC 7.0  NEUTROABS 5.6  HGB 12.9*  HCT 38.4*  MCV 100.8*  PLT 60*   BMP &GFR Recent Labs  Lab 08/15/23 0800  NA 129*  K 4.4  CL 99  CO2 15*  GLUCOSE 80  BUN 42*  CREATININE 1.54*  CALCIUM 8.5*   Estimated Creatinine Clearance: 34 mL/min (A) (by C-G formula based on SCr of 1.54 mg/dL (H)). Liver & Pancreas: Recent Labs  Lab 08/15/23 0800  AST 209*  ALT 129*  ALKPHOS 68  BILITOT 3.6*  PROT 6.0*  ALBUMIN 2.6*   No results for input(s): "LIPASE", "AMYLASE" in the last 168 hours. No results for input(s): "AMMONIA" in the last 168 hours. Diabetic: No results for input(s): "HGBA1C" in the last 72 hours. No results for input(s): "GLUCAP" in the last 168 hours. Cardiac Enzymes: Recent Labs  Lab 08/15/23 0750  CKTOTAL 1,014*   No results for input(s): "PROBNP" in the last 8760 hours. Coagulation Profile: Recent Labs  Lab 08/15/23 1055  INR >10.0*   Thyroid Function Tests: No results for input(s): "TSH", "T4TOTAL", "FREET4", "T3FREE", "THYROIDAB" in the last 72 hours. Lipid Profile: No results for input(s): "CHOL", "HDL", "LDLCALC", "TRIG", "CHOLHDL", "LDLDIRECT" in the last 72 hours. Anemia Panel: No results for input(s): "VITAMINB12", "FOLATE", "FERRITIN", "TIBC", "IRON", "RETICCTPCT" in the last 72 hours. Urine analysis:    Component Value Date/Time   COLORURINE AMBER (A) 08/15/2023 0720   APPEARANCEUR CLEAR 08/15/2023 0720   LABSPEC 1.014 08/15/2023 0720   PHURINE 5.0 08/15/2023 0720   GLUCOSEU NEGATIVE 08/15/2023 0720   HGBUR MODERATE (A) 08/15/2023 0720   BILIRUBINUR NEGATIVE 08/15/2023 0720   KETONESUR NEGATIVE 08/15/2023 0720   PROTEINUR 30 (A) 08/15/2023 0720   UROBILINOGEN 0.2 06/22/2009  0723   NITRITE NEGATIVE 08/15/2023 0720   LEUKOCYTESUR NEGATIVE 08/15/2023 0720   Sepsis Labs: Invalid input(s): "PROCALCITONIN", "LACTICIDVEN"  Microbiology: Recent Results (from the past 240 hours)  Blood Culture (routine x 2)     Status: None   Collection Time: 08/15/23  6:31 AM   Specimen: BLOOD RIGHT WRIST  Result Value Ref Range Status   Specimen Description BLOOD RIGHT WRIST  Final   Special Requests   Final    BOTTLES DRAWN AEROBIC AND ANAEROBIC Blood Culture results may not be optimal due to an inadequate volume of blood received in culture bottles   Culture   Final    NO GROWTH 5 DAYS Performed at [facility], [address] [place], [place] [address]    Report Status 08/20/2023 FINAL  Final  Blood Culture (routine x 2)     Status: None   Collection Time: 08/15/23  6:36 AM   Specimen: BLOOD RIGHT HAND  Result Value Ref Range Status   Specimen Description BLOOD RIGHT HAND  Final   Special Requests   Final    BOTTLES DRAWN AEROBIC ONLY Blood Culture results may not be optimal due to an inadequate volume of blood received in culture bottles   Culture   Final    NO GROWTH 5 DAYS Performed at [facility], [address] [place], [place] [address]    Report Status 08/20/2023 FINAL  Final  Resp panel by RT-PCR (RSV, Flu A&B, Covid) Anterior Nasal Swab     Status: None   Collection Time: 08/15/23 10:01 AM   Specimen: Anterior Nasal Swab  Result Value Ref Range Status   SARS Coronavirus 2 by RT PCR NEGATIVE NEGATIVE Final   Influenza A by PCR NEGATIVE NEGATIVE Final   Influenza B by PCR NEGATIVE NEGATIVE Final    Comment: (NOTE) The Xpert Xpress SARS-CoV-2/FLU/RSV plus assay is intended as an aid in the diagnosis of influenza from Nasopharyngeal swab specimens and should not be used as a sole basis for treatment. Nasal washings and aspirates are unacceptable for Xpert Xpress SARS-CoV-2/FLU/RSV testing.  Fact Sheet for  Patients: BloggerCourse.com  Fact Sheet for Healthcare Providers: SeriousBroker.it  This test is not yet approved or cleared by the [place] FDA and has been authorized for detection and/or diagnosis of SARS-CoV-2 by FDA under an Emergency Use Authorization (EUA). This EUA will remain in effect (meaning this test can be used) for the duration of the COVID-19 declaration under Section 564(b)(1) of the Act, 21 U.S.C. section 360bbb-3(b)(1), unless the authorization is terminated or revoked.     Resp Syncytial Virus by PCR NEGATIVE NEGATIVE Final    Comment: (NOTE) Fact Sheet for Patients: BloggerCourse.com  Fact Sheet for Healthcare Providers: SeriousBroker.it  This test is not yet approved or cleared by the [place] FDA and has been authorized for detection and/or diagnosis of SARS-CoV-2 by FDA under an Emergency Use Authorization (EUA). This EUA will remain in effect (meaning this test can be used) for the duration of the COVID-19 declaration under Section 564(b)(1) of the Act, 21 U.S.C. section 360bbb-3(b)(1), unless the authorization is terminated or revoked.  Performed at [facility], [address], [place], [place] [address]     Radiology Studies: No results found.    Melenie Minniear T. Savalas Monje [facility]  If 7PM-7AM, please contact night-coverage [email or web address] 08/21/2023, 2:23 PM

## 2023-08-21 NOTE — Progress Notes (Signed)
Occupational Therapy Treatment Patient Details Name: Lucas Fuller MRN: [id number] DOB: Mar 05, 1941 Today's Date: 08/21/2023   History of present illness Lucas Fuller is an 83 y/o male admitted 08/15/23 from [facility] due to an unwitnessed fall. Pt recently was here from 1/7-1/10 with dx of acalculous cholecystitis, fall, hyperbilirubinemia, elevated troponin, and rhabdomyolysis. 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   OT comments  Pt at this time more lethargic than last session. Attempted to get tp to EOB but declined. Pt agreed to attempt rolling side to side with max assist and repositioned. He required max assist for UE dressing and max to total assist for attempting to face wash. At this time will need medical transport for discharge. Patient will benefit from continued inpatient follow up therapy, <3 hours/day.       If plan is discharge home, recommend the following:  A lot of help with walking and/or transfers;A lot of help with bathing/dressing/bathroom;Assistance with cooking/housework;Assistance with feeding;Direct supervision/assist for medications management;Direct supervision/assist for financial management;Assist for transportation;Help with stairs or ramp for entrance;Supervision due to cognitive status   Equipment Recommendations  None recommended by OT (TBD)    Recommendations for Other Services      Precautions / Restrictions Precautions Precautions: Fall Precaution Comments: dementia Restrictions Weight Bearing Restrictions Per Provider Order: No       Mobility Bed Mobility Overal bed mobility: Needs Assistance Bed Mobility: Rolling Rolling: Max assist, Used rails              Transfers                   General transfer comment: attempted to complete OOB activity but pt declining at this time     Balance                                            ADL either performed or assessed with clinical judgement   ADL Overall ADL's : Needs assistance/impaired   Eating/Feeding Details (indicate cue type and reason): Pt was not intrested in eating Grooming: Wash/dry hands;Wash/dry face;Maximal assistance;Sitting;Bed level   Upper Body Bathing: Maximal assistance;Bed level   Lower Body Bathing: Total assistance;Bed level   Upper Body Dressing : Maximal assistance;Bed level   Lower Body Dressing: Total assistance;Bed level       Toileting- Clothing Manipulation and Hygiene: Total assistance;Bed level              Extremity/Trunk Assessment Upper Extremity Assessment Upper Extremity Assessment: Generalized weakness   Lower Extremity Assessment Lower Extremity Assessment: Defer to PT evaluation        Vision       Perception     Praxis      Cognition Arousal: Lethargic Behavior During Therapy: Flat affect Overall Cognitive Status: Impaired/Different from baseline Area of Impairment: Orientation, Attention, Memory, Following commands, Safety/judgement, Awareness, Problem solving                 Orientation Level: Disoriented to, Place, Time, Situation   Memory: Decreased recall of precautions, Decreased short-term memory Following Commands: Follows one step commands inconsistently Safety/Judgement: Decreased awareness of safety, Decreased awareness of deficits   Problem Solving: Slow processing, Requires verbal cues General Comments: hx of dementia        Exercises  Shoulder Instructions       General Comments      Pertinent Vitals/ Pain       Pain Assessment Pain Assessment: Faces Faces Pain Scale: Hurts a little bit Breathing: normal Negative Vocalization: occasional moan/groan, low speech, negative/disapproving quality Facial Expression: smiling or inexpressive Body Language: relaxed Consolability: no need to console PAINAD Score:  1 Pain Location: back Pain Descriptors / Indicators: Discomfort Pain Intervention(s): Monitored during session, Limited activity within patient's tolerance  Home Living                                          Prior Functioning/Environment              Frequency  Min 1X/week        Progress Toward Goals  OT Goals(current goals can now be found in the care plan section)  Progress towards OT goals: Not progressing toward goals - comment (Has for pallative consult)  Acute Rehab OT Goals Patient Stated Goal: none OT Goal Formulation: With patient Time For Goal Achievement:  Potential to Achieve Goals: Good ADL Goals Pt Will Perform Grooming: with supervision;standing Pt Will Perform Upper Body Bathing: with min assist Pt Will Perform Lower Body Bathing: with mod assist Pt Will Perform Lower Body Dressing: with set-up;sit to/from stand Pt Will Transfer to Toilet: with min assist;stand pivot transfer Pt Will Perform Toileting - Clothing Manipulation and hygiene: with mod assist Additional ADL Goal #1: Pt will complete Medi cog assessment (if cognition begins improving) to determine capacity to return to independent community living  Plan      Co-evaluation                 AM-PAC OT "6 Clicks" Daily Activity     Outcome Measure   Help from another person eating meals?: A Lot Help from another person taking care of personal grooming?: A Lot Help from another person toileting, which includes using toliet, bedpan, or urinal?: Total Help from another person bathing (including washing, rinsing, drying)?: A Lot Help from another person to put on and taking off regular upper body clothing?: A Lot Help from another person to put on and taking off regular lower body clothing?: Total 6 Click Score: 10    End of Session    OT Visit Diagnosis: Unsteadiness on feet (R26.81);Other symptoms and signs involving cognitive function   Activity  Tolerance Patient limited by fatigue   Patient Left in bed;with call bell/phone within reach;with bed alarm set   Nurse Communication Mobility status        Time: 1610-9604 OT Time Calculation (min): 13 min  Charges: OT Treatments $Self Care/Home Management : 8-22 mins  Presley Raddle OTR/L  Acute Rehab Services  [phone] office number   Alphia Moh 08/21/2023, 9:25 AM

## 2023-08-21 NOTE — TOC Progression Note (Addendum)
Transition of Care [facility]) - Progression Note    Patient Details  Name: Lucas Fuller MRN: [id number] Date of Birth: 1940/10/29  Transition of Care [facility]) CM/SW Contact  [name], LCSW Phone Number: 08/21/2023, 12:15 PM  Clinical Narrative:    12:15 PM-CSW reached out to [facility] to obtain update on bed status.   2:11 PM-Per Haynes Bast, they may have a discharge tomorrow or Friday. CSW stressed his insurance approval expires tomorrow.    Expected Discharge Plan: Skilled Nursing Facility Barriers to Discharge: Continued Medical Work up, English as a second language teacher  Expected Discharge Plan and Services In-house Referral: Clinical Social Work, Hospice / Palliative Care     Living arrangements for the past 2 months: Single Family Home, Skilled Nursing Facility Expected Discharge Date: 08/20/23                                     Social Determinants of Health (SDOH) Interventions SDOH Screenings   Food Insecurity: Patient Unable To Answer (08/16/2023)  Housing: Patient Unable To Answer (08/16/2023)  Transportation Needs: Patient Unable To Answer (08/16/2023)  Utilities: Patient Unable To Answer (08/16/2023)  Alcohol Screen: Low Risk  (11/10/2021)  Social Connections: Patient Unable To Answer (08/16/2023)  Tobacco Use: Low Risk  (08/15/2023)    Readmission Risk Interventions     No data to display

## 2023-08-22 DIAGNOSIS — T68XXXD Hypothermia, subsequent encounter: Secondary | ICD-10-CM | POA: Diagnosis not present

## 2023-08-22 DIAGNOSIS — D696 Thrombocytopenia, unspecified: Secondary | ICD-10-CM | POA: Diagnosis not present

## 2023-08-22 DIAGNOSIS — I959 Hypotension, unspecified: Secondary | ICD-10-CM | POA: Diagnosis not present

## 2023-08-22 DIAGNOSIS — R531 Weakness: Secondary | ICD-10-CM | POA: Diagnosis not present

## 2023-08-22 DIAGNOSIS — K7200 Acute and subacute hepatic failure without coma: Secondary | ICD-10-CM | POA: Diagnosis not present

## 2023-08-22 DIAGNOSIS — J9601 Acute respiratory failure with hypoxia: Secondary | ICD-10-CM | POA: Diagnosis not present

## 2023-08-22 DIAGNOSIS — N179 Acute kidney failure, unspecified: Secondary | ICD-10-CM | POA: Diagnosis not present

## 2023-08-22 DIAGNOSIS — Z743 Need for continuous supervision: Secondary | ICD-10-CM | POA: Diagnosis not present

## 2023-08-22 DIAGNOSIS — Z515 Encounter for palliative care: Secondary | ICD-10-CM | POA: Diagnosis not present

## 2023-08-22 DIAGNOSIS — I482 Chronic atrial fibrillation, unspecified: Secondary | ICD-10-CM

## 2023-08-22 DIAGNOSIS — F0390 Unspecified dementia without behavioral disturbance: Secondary | ICD-10-CM | POA: Diagnosis not present

## 2023-08-22 DIAGNOSIS — R040 Epistaxis: Secondary | ICD-10-CM | POA: Diagnosis not present

## 2023-08-22 DIAGNOSIS — M6281 Muscle weakness (generalized): Secondary | ICD-10-CM | POA: Diagnosis not present

## 2023-08-22 DIAGNOSIS — I5041 Acute combined systolic (congestive) and diastolic (congestive) heart failure: Secondary | ICD-10-CM | POA: Diagnosis not present

## 2023-08-22 DIAGNOSIS — R748 Abnormal levels of other serum enzymes: Secondary | ICD-10-CM | POA: Diagnosis not present

## 2023-08-22 DIAGNOSIS — R7989 Other specified abnormal findings of blood chemistry: Secondary | ICD-10-CM | POA: Diagnosis not present

## 2023-08-22 DIAGNOSIS — I10 Essential (primary) hypertension: Secondary | ICD-10-CM | POA: Diagnosis not present

## 2023-08-22 DIAGNOSIS — L89313 Pressure ulcer of right buttock, stage 3: Secondary | ICD-10-CM | POA: Diagnosis not present

## 2023-08-22 DIAGNOSIS — E871 Hypo-osmolality and hyponatremia: Secondary | ICD-10-CM | POA: Diagnosis not present

## 2023-08-22 DIAGNOSIS — R791 Abnormal coagulation profile: Secondary | ICD-10-CM | POA: Diagnosis not present

## 2023-08-22 DIAGNOSIS — I480 Paroxysmal atrial fibrillation: Secondary | ICD-10-CM | POA: Diagnosis not present

## 2023-08-22 DIAGNOSIS — R570 Cardiogenic shock: Secondary | ICD-10-CM | POA: Diagnosis not present

## 2023-08-22 DIAGNOSIS — L89316 Pressure-induced deep tissue damage of right buttock: Secondary | ICD-10-CM | POA: Diagnosis not present

## 2023-08-22 DIAGNOSIS — R188 Other ascites: Secondary | ICD-10-CM | POA: Diagnosis not present

## 2023-08-22 DIAGNOSIS — I428 Other cardiomyopathies: Secondary | ICD-10-CM | POA: Diagnosis not present

## 2023-08-22 DIAGNOSIS — L89616 Pressure-induced deep tissue damage of right heel: Secondary | ICD-10-CM | POA: Diagnosis not present

## 2023-08-22 DIAGNOSIS — M6282 Rhabdomyolysis: Secondary | ICD-10-CM | POA: Diagnosis not present

## 2023-08-22 DIAGNOSIS — G9341 Metabolic encephalopathy: Secondary | ICD-10-CM | POA: Diagnosis not present

## 2023-08-22 DIAGNOSIS — Z9181 History of falling: Secondary | ICD-10-CM | POA: Diagnosis not present

## 2023-08-22 DIAGNOSIS — D649 Anemia, unspecified: Secondary | ICD-10-CM | POA: Diagnosis not present

## 2023-08-22 DIAGNOSIS — Z23 Encounter for immunization: Secondary | ICD-10-CM | POA: Diagnosis not present

## 2023-08-22 DIAGNOSIS — E039 Hypothyroidism, unspecified: Secondary | ICD-10-CM | POA: Diagnosis not present

## 2023-08-22 DIAGNOSIS — E8809 Other disorders of plasma-protein metabolism, not elsewhere classified: Secondary | ICD-10-CM | POA: Diagnosis not present

## 2023-08-22 DIAGNOSIS — K819 Cholecystitis, unspecified: Secondary | ICD-10-CM | POA: Diagnosis not present

## 2023-08-22 DIAGNOSIS — R627 Adult failure to thrive: Secondary | ICD-10-CM | POA: Diagnosis not present

## 2023-08-22 DIAGNOSIS — J449 Chronic obstructive pulmonary disease, unspecified: Secondary | ICD-10-CM | POA: Diagnosis not present

## 2023-08-22 DIAGNOSIS — L89153 Pressure ulcer of sacral region, stage 3: Secondary | ICD-10-CM | POA: Diagnosis not present

## 2023-08-22 DIAGNOSIS — Z7901 Long term (current) use of anticoagulants: Secondary | ICD-10-CM | POA: Diagnosis not present

## 2023-08-22 DIAGNOSIS — I700 Atherosclerosis of aorta: Secondary | ICD-10-CM | POA: Diagnosis not present

## 2023-08-22 DIAGNOSIS — Z8673 Personal history of transient ischemic attack (TIA), and cerebral infarction without residual deficits: Secondary | ICD-10-CM | POA: Diagnosis not present

## 2023-08-22 DIAGNOSIS — Z7401 Bed confinement status: Secondary | ICD-10-CM | POA: Diagnosis not present

## 2023-08-22 DIAGNOSIS — R110 Nausea: Secondary | ICD-10-CM | POA: Diagnosis not present

## 2023-08-22 DIAGNOSIS — K117 Disturbances of salivary secretion: Secondary | ICD-10-CM | POA: Diagnosis not present

## 2023-08-22 DIAGNOSIS — T68XXXA Hypothermia, initial encounter: Secondary | ICD-10-CM | POA: Diagnosis not present

## 2023-08-22 DIAGNOSIS — Z8551 Personal history of malignant neoplasm of bladder: Secondary | ICD-10-CM | POA: Diagnosis not present

## 2023-08-22 NOTE — TOC Transition Note (Signed)
Transition of Care [facility]) - Discharge Note   Patient Details  Name: Lucas Fuller MRN: [id number] Date of Birth: Oct 08, 1940  [facility]) CM/SW Contact:  Marliss Coots, LCSW Phone Number: 08/22/2023, 12:48 PM   Clinical Narrative:     Patient will DC to: [facility] Anticipated DC date: 08/22/2023 Family notified: Keenan Bachelor; Niece; [phone] Transport by: Sharin Mons   Per MD patient ready for DC to [facility]. RN to call report prior to discharge [phone]). RN, patient, patient's family, and facility notified of DC. Discharge Summary and FL2 sent to facility. DC packet on chart. Ambulance transport requested for patient.   CSW will sign off for now as social work intervention is no longer needed. Please consult [place] again if new needs arise.   Final next level of care: Skilled Nursing Facility Barriers to Discharge: Barriers Resolved   Patient Goals and CMS Choice            Discharge Placement              Patient chooses bed at: [facility] Patient to be transferred to facility by: [facility] Name of family member notified: Keenan Bachelor; Niece; [phone] Patient and family notified of of transfer: 08/22/23  Discharge Plan and Services Additional resources added to the After Visit Summary for   In-house Referral: Clinical Social Work, Hospice / Palliative Care                                   Social Drivers of Health (SDOH) Interventions SDOH Screenings   Food Insecurity: Patient Unable To Answer (08/16/2023)  Housing: Patient Unable To Answer (08/16/2023)  Transportation Needs: Patient Unable To Answer (08/16/2023)  Utilities: Patient Unable To Answer (08/16/2023)  Alcohol Screen: Low Risk  (11/10/2021)  Social Connections: Patient Unable To Answer (08/16/2023)  Tobacco Use: Low Risk  (08/15/2023)     Readmission Risk Interventions     No data to display

## 2023-08-22 NOTE — Progress Notes (Signed)
Report called to [place] at 1300

## 2023-08-22 NOTE — Care Management Important Message (Signed)
Important Message  Patient Details  Name: Lucas Fuller MRN: [id number] Date of Birth: Dec 03, 1940   Important Message Given:  Yes - Medicare IM     Dorena Bodo 08/22/2023, 2:57 PM

## 2023-08-22 NOTE — Discharge Summary (Signed)
Physician Discharge Summary  Lucas Fuller QIO:[id number] DOB: 1940-07-27 DOA: 08/15/2023  PCP: Georgann Housekeeper, MD  Admit date: 08/15/2023 Discharge date: 08/22/2023 Admitted From: SNF Disposition: SNF Recommendations for Outpatient Follow-up:  Continue end-of-life care  Discharge Condition: Stable for transfer. 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 After goal of care discussion between admitting provider and patient's family, he was transition to full comfort measures, and admitted for end-of-life care. Palliative medicine following. Patient is returning to SNF with palliative care for end-of-life care.  See individual problem list below for more.   Problems addressed during this hospitalization End-of-life care-appreciate help by palliative medicine Acute on chronic HFrEF/BiV failure with cardiogenic shock Acute respiratory failure with hypoxia Hypotension/lactic acidosis Shock liver/acute thrombocytopenia/coagulopathy Acute kidney injury Hypothermia Coagulopathy Elevated CK/traumatic rhabdomyolysis Chronic atrial fibrillation Severe dementia without behavioral disturbance Unwitnessed fall at [facility] -Issued printed and signed prescriptions for Roxanol, Ativan, Zofran, glycopyrrolate... For end-of-life care.   Body mass index is 23.13 kg/m.   Pressure skin injury: POA Pressure Injury 08/17/23 Sacrum Posterior Stage 2 -  Partial thickness loss of dermis presenting as a shallow open injury with a red, pink wound bed without slough. noted sacral wounds (Active)  08/17/23 0800  Location: Sacrum  Location Orientation: Posterior   Staging: Stage 2 -  Partial thickness loss of dermis presenting as a shallow open injury with a red, pink wound bed without slough.  Wound Description (Comments): noted sacral wounds  Present on Admission: No  Dressing Type Foam - Lift dressing to assess site every shift 08/22/23 0747  -Frequent turning  Time spent 35 minutes  Vital signs Vitals:   08/20/23 2036 08/21/23 0802 08/21/23 2000 08/22/23 0817  BP: 93/71 (!) 87/68 93/62 97/67   Pulse: 69 63 67 68  Temp:  98 F (36.7 C) (!) 97.3 F (36.3 C) 98 F (36.7 C)  Resp: 19 18 18 18   Height:      Weight:      SpO2: 100% 100% 100% 100%  TempSrc:   Oral   BMI (Calculated):         Discharge exam  GENERAL: Sleeping.  No apparent distress.  Frail. RESP:  No IWOB.  Fair aeration bilaterally. CVS:  RRR. Heart sounds normal.  ABD/GI/GU: BS+. Abd soft, NTND.  MSK/EXT: No apparent deformity. NEURO: Sleepy.  No apparent focal neuro deficit. PSYCH: No distress agitation.  Discharge Instructions Discharge Instructions     Diet - low sodium heart healthy   Complete by: As directed    Comfort diet   Increase activity slowly   Complete by: As directed    No wound care   Complete by: As directed       Allergies as of 08/22/2023       Reactions   Asa [aspirin] Hives   Pt can tolerate 81 mg with no issues        Medication List     STOP taking these medications    albuterol 108 (90 Base) MCG/ACT inhaler Commonly known as: VENTOLIN HFA   apixaban 5 MG Tabs tablet Commonly known as: ELIQUIS   atorvastatin 20 MG tablet Commonly known as: LIPITOR  carvedilol 3.125 MG tablet Commonly known as: COREG   furosemide 20 MG tablet Commonly known as: LASIX   furosemide 40 MG tablet Commonly known as: LASIX   levothyroxine 50 MCG tablet Commonly known as: SYNTHROID   midodrine 5 MG tablet Commonly known as: PROAMATINE   oxybutynin 5 MG 24 hr tablet Commonly known as: DITROPAN-XL   potassium chloride SA 20  MEQ tablet Commonly known as: KLOR-CON M   sodium chloride 1 g tablet       TAKE these medications    acetaminophen 325 MG tablet Commonly known as: TYLENOL Take 2 tablets (650 mg total) by mouth every 6 (six) hours as needed for mild pain (pain score 1-3) (or Fever >/= 101). What changed:  medication strength how much to take reasons to take this   Glycopyrrolate 1 MG/5ML Soln Take 5 mLs (1 mg total) by mouth 4 (four) times daily as needed for up to 3 days.   LORazepam 0.5 MG tablet Commonly known as: Ativan Take 1 tablet (0.5 mg total) by mouth every 4 (four) hours as needed for up to 20 doses for anxiety.   morphine CONCENTRATE 10 mg / 0.5 ml concentrated solution Take 0.5 mLs (10 mg total) by mouth every 3 (three) hours as needed for moderate pain (pain score 4-6), severe pain (pain score 7-10) or shortness of breath.   ondansetron 4 MG disintegrating tablet Commonly known as: ZOFRAN-ODT Take 1 tablet (4 mg total) by mouth every 8 (eight) hours as needed for nausea or vomiting.        Consultations: Palliative medicine  Procedures/Studies:   CT Angio Abd/Pel W and/or Wo Contrast Result Date: 08/15/2023 CLINICAL DATA:  Portal vein thrombosis. EXAM: CTA ABDOMEN AND PELVIS WITH CONTRAST TECHNIQUE: Multidetector CT imaging of the abdomen and pelvis was performed using the standard protocol during bolus administration of intravenous contrast. Multiplanar reconstructed images and MIPs were obtained and reviewed to evaluate the vascular anatomy. RADIATION DOSE REDUCTION: This exam was performed according to the departmental dose-optimization program which includes automated exposure control, adjustment of the mA and/or kV according to patient size and/or use of iterative reconstruction technique. CONTRAST:  75mL OMNIPAQUE IOHEXOL 350 MG/ML SOLN COMPARISON:  Noncontrast CT of the chest, abdomen and pelvis same date. Abdominopelvic CT 07/31/2023. FINDINGS: VASCULAR Aorta:  Atherosclerosis without evidence of aneurysm, dissection or stenosis. Celiac: Conventional anatomy. Ostial atherosclerosis without evidence of aneurysm or high-grade stenosis. SMA: Ostial atherosclerosis without evidence of aneurysm or high-grade stenosis. Renals: Patent renal arteries bilaterally. Mild atherosclerosis without aneurysm or significant stenosis. IMA: Patent. Inflow: Atherosclerosis without evidence aneurysm or significant stenosis. Proximal Outflow: Bilateral common femoral and visualized portions of the superficial and profunda femoral arteries are patent without evidence of aneurysm, dissection, vasculitis or significant stenosis. Veins: Study includes delayed phase imaging. There is reflux of contrast into the IVC and hepatic veins. The portal, superior mesenteric and splenic veins are patent. The inferior vena cava and renal veins are patent. The iliac veins are patent. Review of the MIP images confirms the above findings. NON-VASCULAR Lower chest: As on earlier CT, moderate right and small left pleural effusions with associated bibasilar atelectasis. Hepatobiliary: Mild contour irregularity of the liver, as before. No focal lesion or acute abnormality identified. As above, reflux of contrast into the IVC and hepatic veins. Probable vicarious excretion of contrast into the gallbladder lumen. No evidence of gallbladder wall thickening or significant biliary dilatation. Pancreas: No evidence of pancreatic mass, ductal dilatation or surrounding inflammation. Spleen: Normal  in size without focal abnormality. No evidence of acute splenic injury. Adrenals/Urinary Tract: Both adrenal glands appear normal. Probable mild renal cortical scarring on the left. No evidence of renal mass, hydronephrosis or urinary tract calculus. The bladder appears unremarkable for its degree of distention. Stomach/Bowel: High density stool within the distal colon. The stomach appears unremarkable for its degree of distention.  Mild nonspecific diffuse bowel wall thickening, likely related to ascites. No significant bowel distension or focal abnormality identified. Lymphatic: No enlarged abdominopelvic lymph nodes identified. Reproductive: The prostate gland and seminal vesicles appear unremarkable. Other: Small to moderate volume of ascites, increased from CT of 2 weeks ago, not significantly changed from earlier today. No high density components or active bleeding identified. No evidence of pneumoperitoneum. Moderate bilateral flank edema. Postsurgical changes consistent with previous right inguinal herniorrhaphy. Musculoskeletal: No evidence of acute fracture or aggressive osseous lesion. Multilevel lumbar facet arthropathy. IMPRESSION: 1. No evidence of portal vein thrombosis. 2. No other acute vascular findings. Aortic and branch vessel atherosclerosis as described. 3. Reflux of contrast into the IVC and hepatic veins suggesting elevated right heart pressures. 4. Small to moderate volume of ascites, increased from CT of 2 weeks ago, not significantly changed from earlier today. No high density components or active bleeding identified. 5. Moderate right and small left pleural effusions with associated bibasilar atelectasis. 6. Mild contour irregularity of the liver, as before, suggesting cirrhosis. 7.  Aortic Atherosclerosis (ICD10-I70.0). Electronically Signed   By: Carey Bullocks M.D.   On: 08/15/2023 13:03   CT CHEST ABDOMEN PELVIS WO CONTRAST Result Date: 08/15/2023 CLINICAL DATA:  Found down. EXAM: CT CHEST, ABDOMEN AND PELVIS WITHOUT CONTRAST TECHNIQUE: Multidetector CT imaging of the chest, abdomen and pelvis was performed following the standard protocol without IV contrast. RADIATION DOSE REDUCTION: This exam was performed according to the departmental dose-optimization program which includes automated exposure control, adjustment of the mA and/or kV according to patient size and/or use of iterative reconstruction  technique. COMPARISON:  CT scans of 07/29/2023 FINDINGS: CT CHEST FINDINGS Cardiovascular: The heart is enlarged but stable. No pericardial effusion. Stable tortuosity, ectasia and calcification of the thoracic aorta. Stable scattered coronary artery calcifications Mediastinum/Nodes: No obvious mediastinal hilar mass or adenopathy. No hematoma. The esophagus is grossly normal. Lungs/Pleura: Bilateral pleural effusions, right larger than left with overlying atelectasis. Patchy perihilar ground-glass opacity could suggest pulmonary edema. No definite infiltrates or pneumothorax. Musculoskeletal: No sternal, rib or thoracic vertebral body fractures are identified. CT ABDOMEN PELVIS FINDINGS Hepatobiliary: No acute hepatic injury is identified. Persistent perihepatic fluid. High attenuation material in the gallbladder could be related to prior CT scan with vicarious excretion. Pancreas: No mass, inflammation or ductal dilatation. No peripancreatic fluid collection. Spleen: No acute splenic injury or perisplenic hematoma. Adrenals/Urinary Tract: The adrenal glands and kidneys are grossly normal. No acute injury or perinephric hematoma. Bladder is grossly normal. Stomach/Bowel: The stomach, duodenum, small bowel and colon are grossly normal. Vascular/Lymphatic: Stable aortic and branch vessel calcifications but no aneurysm. Reproductive: The prostate gland and seminal vesicles are unremarkable. Other: Small to moderate volume abdominal/pelvic ascites and diffuse mesenteric edema slightly progressive since the prior CT scan. No obvious free air. Musculoskeletal: No acute bony findings. IMPRESSION: 1. Intact bony thorax. 2. Bilateral pleural effusions, right larger than left with overlying atelectasis. 3. Patchy perihilar ground-glass opacity could suggest pulmonary edema. No pulmonary contusion or pneumothorax. 4. No acute abdominal/pelvic findings. 5. Small to moderate volume abdominal/pelvic ascites and diffuse  mesenteric edema slightly progressive since the  prior CT scan. 6. Aortic atherosclerosis. Aortic Atherosclerosis (ICD10-I70.0). Electronically Signed   By: Rudie Meyer M.D.   On: 08/15/2023 09:02   CT Head Wo Contrast Result Date: 08/15/2023 CLINICAL DATA:  Patient found down. EXAM: CT HEAD WITHOUT CONTRAST CT CERVICAL SPINE WITHOUT CONTRAST TECHNIQUE: Multidetector CT imaging of the head and cervical spine was performed following the standard protocol without intravenous contrast. Multiplanar CT image reconstructions of the cervical spine were also generated. RADIATION DOSE REDUCTION: This exam was performed according to the departmental dose-optimization program which includes automated exposure control, adjustment of the mA and/or kV according to patient size and/or use of iterative reconstruction technique. COMPARISON:  Head CT 07/30/2023 FINDINGS: CT HEAD FINDINGS Brain: Stable age related cerebral atrophy, ventriculomegaly and periventricular white matter disease. Prior infarcts again noted. No extra-axial fluid collections are identified. No CT findings for acute hemispheric infarction or intracranial hemorrhage. No mass lesions. The brainstem and cerebellum are normal. Vascular: Stable vascular calcifications but no aneurysm hyperdense vessels. Skull: No acute skull fracture. Sinuses/Orbits: Scattered sinus disease and evidence of prior sinonasal surgery. The mastoid air cells are clear. The globes are intact. Other: No scalp lesions or scalp hematoma. CT CERVICAL SPINE FINDINGS Alignment: Reversal of the normal cervical lordosis unchanged since 2007. Remote surgical changes with interbody fusion at C5-6 Skull base and vertebrae: No acute cervical spine fracture. Advanced degenerative cervical spondylosis with multilevel disc disease and facet disease and remote surgical changes. Soft tissues and spinal canal: No prevertebral fluid or swelling. No visible canal hematoma. Disc levels: The spinal canal is  quite generous. No significant canal stenosis. No significant bony foraminal stenosis. Upper chest: The lung apices are grossly clear. Other: Scattered carotid artery calcifications. IMPRESSION: 1. Stable age related cerebral atrophy, ventriculomegaly and periventricular white matter disease. Remote infarcts. 2. No acute intracranial findings or skull fracture. 3. No acute cervical spine fracture. 4. Advanced degenerative cervical spondylosis with multilevel disc disease and facet disease and remote surgical changes. Electronically Signed   By: Rudie Meyer M.D.   On: 08/15/2023 08:54   CT Cervical Spine Wo Contrast Result Date: 08/15/2023 CLINICAL DATA:  Patient found down. EXAM: CT HEAD WITHOUT CONTRAST CT CERVICAL SPINE WITHOUT CONTRAST TECHNIQUE: Multidetector CT imaging of the head and cervical spine was performed following the standard protocol without intravenous contrast. Multiplanar CT image reconstructions of the cervical spine were also generated. RADIATION DOSE REDUCTION: This exam was performed according to the departmental dose-optimization program which includes automated exposure control, adjustment of the mA and/or kV according to patient size and/or use of iterative reconstruction technique. COMPARISON:  Head CT 07/30/2023 FINDINGS: CT HEAD FINDINGS Brain: Stable age related cerebral atrophy, ventriculomegaly and periventricular white matter disease. Prior infarcts again noted. No extra-axial fluid collections are identified. No CT findings for acute hemispheric infarction or intracranial hemorrhage. No mass lesions. The brainstem and cerebellum are normal. Vascular: Stable vascular calcifications but no aneurysm hyperdense vessels. Skull: No acute skull fracture. Sinuses/Orbits: Scattered sinus disease and evidence of prior sinonasal surgery. The mastoid air cells are clear. The globes are intact. Other: No scalp lesions or scalp hematoma. CT CERVICAL SPINE FINDINGS Alignment: Reversal of the  normal cervical lordosis unchanged since 2007. Remote surgical changes with interbody fusion at C5-6 Skull base and vertebrae: No acute cervical spine fracture. Advanced degenerative cervical spondylosis with multilevel disc disease and facet disease and remote surgical changes. Soft tissues and spinal canal: No prevertebral fluid or swelling. No visible canal hematoma. Disc levels: The spinal canal  is quite generous. No significant canal stenosis. No significant bony foraminal stenosis. Upper chest: The lung apices are grossly clear. Other: Scattered carotid artery calcifications. IMPRESSION: 1. Stable age related cerebral atrophy, ventriculomegaly and periventricular white matter disease. Remote infarcts. 2. No acute intracranial findings or skull fracture. 3. No acute cervical spine fracture. 4. Advanced degenerative cervical spondylosis with multilevel disc disease and facet disease and remote surgical changes. Electronically Signed   By: Rudie Meyer M.D.   On: 08/15/2023 08:54   DG Chest Port 1 View Result Date: 08/15/2023 CLINICAL DATA:  Level 2 trauma, fall injury on blood thinners. EXAM: PORTABLE PELVIS 1-2 VIEWS; PORTABLE CHEST - 1 VIEW COMPARISON:  CTA chest and CT abdomen and pelvis both 07/31/2023 FINDINGS: Chest AP portable 6:30 a.m.: Stable cardiomegaly.  Central vessels are normal caliber. Subtle right lung perihilar ground-glass opacity is still seen but improved consistent with clearing edema and/or pneumonitis. Minimal pleural effusions appear similar with remaining lungs clear. Stable mediastinum with mild aortic atherosclerosis. Intact thoracic cage. Moderate thoracic spondylosis. Portable AP supine pelvis, single view: No evidence of pelvic fracture or diastasis. Mild arthrosis at the SI joints and hips. No other focal bone abnormality. Grossly unremarkable soft tissues. IMPRESSION: 1. No evidence of acute chest process. 2. Stable cardiomegaly. 3. Improving right lung perihilar ground-glass  opacity consistent with clearing edema and/or pneumonitis. 4. Minimal pleural effusions. 5. No evidence of pelvic fracture or diastasis. 6. Aortic atherosclerosis. Electronically Signed   By: Almira Bar M.D.   On: 08/15/2023 06:46   DG Pelvis Portable Result Date: 08/15/2023 CLINICAL DATA:  Level 2 trauma, fall injury on blood thinners. EXAM: PORTABLE PELVIS 1-2 VIEWS; PORTABLE CHEST - 1 VIEW COMPARISON:  CTA chest and CT abdomen and pelvis both 07/31/2023 FINDINGS: Chest AP portable 6:30 a.m.: Stable cardiomegaly.  Central vessels are normal caliber. Subtle right lung perihilar ground-glass opacity is still seen but improved consistent with clearing edema and/or pneumonitis. Minimal pleural effusions appear similar with remaining lungs clear. Stable mediastinum with mild aortic atherosclerosis. Intact thoracic cage. Moderate thoracic spondylosis. Portable AP supine pelvis, single view: No evidence of pelvic fracture or diastasis. Mild arthrosis at the SI joints and hips. No other focal bone abnormality. Grossly unremarkable soft tissues. IMPRESSION: 1. No evidence of acute chest process. 2. Stable cardiomegaly. 3. Improving right lung perihilar ground-glass opacity consistent with clearing edema and/or pneumonitis. 4. Minimal pleural effusions. 5. No evidence of pelvic fracture or diastasis. 6. Aortic atherosclerosis. Electronically Signed   By: Almira Bar M.D.   On: 08/15/2023 06:46   ECHOCARDIOGRAM COMPLETE Result Date: 07/31/2023    ECHOCARDIOGRAM REPORT   Patient Name:   LI FRAGOSO Date of Exam: 07/31/2023 Medical Rec #:  [id number]      Height:       67.0 in Accession #:    [id number]     Weight:       135.0 lb Date of Birth:  Mar 12, 1941      BSA:          1.711 m Patient Age:    82 years       BP:           103/73 mmHg Patient Gender: M              HR:           73 bpm. Exam Location:  Inpatient Procedure: 2D Echo, Cardiac Doppler, Color Doppler and Intracardiac  Opacification Agent  Indications:    Elevated Troponin  History:        Patient has prior history of Echocardiogram examinations, most                 recent 11/09/2021. Cardiomyopathy and CHF, Migraine,                 Arrythmias:Atrial Fibrillation and Atrial Flutter,                 Signs/Symptoms:Shortness of Breath; Risk Factors:Hypertension.  Sonographer:    Lucendia Herrlich RCS Referring Phys: [phone] SUBRINA SUNDIL IMPRESSIONS  1. Left ventricular ejection fraction, by estimation, is 20 to 25%. The left ventricle has severely decreased function. The left ventricle demonstrates global hypokinesis. The left ventricular internal cavity size was mildly dilated. Left ventricular diastolic parameters are indeterminate.  2. Right ventricular systolic function is moderately reduced. The right ventricular size is normal. There is mildly elevated pulmonary artery systolic pressure. The estimated right ventricular systolic pressure is 42.7 mmHg.  3. Left atrial size was severely dilated.  4. Right atrial size was mildly dilated.  5. Mild to moderate mitral valve regurgitation.  6. Tricuspid valve regurgitation is moderate.  7. There is moderate calcification of the aortic valve. Aortic valve regurgitation is not visualized.  8. The inferior vena cava is dilated in size with <50% respiratory variability, suggesting right atrial pressure of 15 mmHg. FINDINGS  Left Ventricle: Left ventricular ejection fraction, by estimation, is 20 to 25%. The left ventricle has severely decreased function. The left ventricle demonstrates global hypokinesis. Definity contrast agent was given IV to delineate the left ventricular endocardial borders. The left ventricular internal cavity size was mildly dilated. There is no left ventricular hypertrophy. Left ventricular diastolic parameters are indeterminate. Right Ventricle: The right ventricular size is normal. Right ventricular systolic function is moderately reduced. There is mildly elevated pulmonary artery  systolic pressure. The tricuspid regurgitant velocity is 2.63 m/s, and with an assumed right atrial pressure of 15 mmHg, the estimated right ventricular systolic pressure is 42.7 mmHg. Left Atrium: Left atrial size was severely dilated. Right Atrium: Right atrial size was mildly dilated. Pericardium: There is no evidence of pericardial effusion. Mitral Valve: Mild to moderate mitral valve regurgitation. Tricuspid Valve: Tricuspid valve regurgitation is moderate. Aortic Valve: There is moderate calcification of the aortic valve. Aortic valve regurgitation is not visualized. Aortic valve mean gradient measures 7.0 mmHg. Aortic valve peak gradient measures 13.2 mmHg. Aortic valve area, by VTI measures 1.06 cm. Pulmonic Valve: Pulmonic valve regurgitation is trivial. Aorta: The aortic root and ascending aorta are structurally normal, with no evidence of dilitation. Venous: The inferior vena cava is dilated in size with less than 50% respiratory variability, suggesting right atrial pressure of 15 mmHg. IAS/Shunts: No atrial level shunt detected by color flow Doppler.  LEFT VENTRICLE PLAX 2D LVIDd:         5.10 cm   Diastology LVIDs:         4.80 cm   LV e' medial:    4.26 cm/s LV PW:         0.80 cm   LV E/e' medial:  26.3 LV IVS:        0.80 cm   LV e' lateral:   7.42 cm/s LVOT diam:     2.20 cm   LV E/e' lateral: 15.1 LV SV:         39 LV SV Index:   23 LVOT Area:  3.80 cm  RIGHT VENTRICLE            IVC RV S prime:     4.81 cm/s  IVC diam: 2.40 cm LEFT ATRIUM              Index        RIGHT ATRIUM           Index LA diam:        5.00 cm  2.92 cm/m   RA Area:     17.90 cm LA Vol (A2C):   80.4 ml  46.99 ml/m  RA Volume:   43.00 ml  25.13 ml/m LA Vol (A4C):   124.0 ml 72.47 ml/m LA Biplane Vol: 105.0 ml 61.36 ml/m  AORTIC VALVE                     PULMONIC VALVE AV Area (Vmax):    1.14 cm      PR End Diast Vel: 3.88 msec AV Area (Vmean):   1.09 cm AV Area (VTI):     1.06 cm AV Vmax:           182.00 cm/s AV  Vmean:          120.000 cm/s AV VTI:            0.366 m AV Peak Grad:      13.2 mmHg AV Mean Grad:      7.0 mmHg LVOT Vmax:         54.53 cm/s LVOT Vmean:        34.433 cm/s LVOT VTI:          0.102 m LVOT/AV VTI ratio: 0.28  AORTA Ao Root diam: 3.70 cm Ao Asc diam:  3.60 cm MITRAL VALVE                TRICUSPID VALVE MV Area (PHT): 6.48 cm     TR Peak grad:   27.7 mmHg MV Decel Time: 117 msec     TR Vmax:        263.00 cm/s MR Peak grad: 38.3 mmHg MR Vmax:      309.50 cm/s   SHUNTS MV E velocity: 112.00 cm/s  Systemic VTI:  0.10 m                             Systemic Diam: 2.20 cm Carolan Clines Electronically signed by Carolan Clines Signature Date/Time: 07/31/2023/5:26:43 PM    Final    NM Hepatobiliary Liver Func Result Date: 07/31/2023 CLINICAL DATA:  Right upper quadrant pain. Gallbladder sludge on recent ultrasound. EXAM: NUCLEAR MEDICINE HEPATOBILIARY IMAGING TECHNIQUE: Sequential images of the abdomen were obtained out to 60 minutes following intravenous administration of radiopharmaceutical. Due to lack of gallbladder activity at 60 minutes, imaging was continued for another 60 minutes following administration of 2.5 mg IV morphine. RADIOPHARMACEUTICALS:  7.8 mCi Tc-46m  Choletec IV COMPARISON:  None Available. FINDINGS: Prompt uptake and biliary excretion of activity by the liver is seen. Biliary activity passes into small bowel, consistent with patent common bile duct. No gallbladder activity was seen at 60 minutes, however, gallbladder activity is present following morphine administration. IMPRESSION: No evidence of obstruction of cystic duct or common bile duct. Electronically Signed   By: Danae Orleans M.D.   On: 07/31/2023 14:31   CT Angio Chest PE W and/or Wo Contrast Result Date: 07/31/2023 CLINICAL DATA:  Patient found down by family.  Altered mental status since 12/31. Unsteady on feet. Blunt abdominal trauma. Abdominal pain. EXAM: CT ANGIOGRAPHY CHEST CT ABDOMEN AND PELVIS WITH CONTRAST TECHNIQUE:  Multidetector CT imaging of the chest was performed using the standard protocol during bolus administration of intravenous contrast. Multiplanar CT image reconstructions and MIPs were obtained to evaluate the vascular anatomy. Multidetector CT imaging of the abdomen and pelvis was performed using the standard protocol during bolus administration of intravenous contrast. RADIATION DOSE REDUCTION: This exam was performed according to the departmental dose-optimization program which includes automated exposure control, adjustment of the mA and/or kV according to patient size and/or use of iterative reconstruction technique. CONTRAST:  75mL OMNIPAQUE IOHEXOL 350 MG/ML SOLN COMPARISON:  Ultrasound 07/30/2023; chest radiograph 07/30/2023; CTA chest 11/08/2021; CT abdomen and pelvis 09/21/2015 FINDINGS: CTA CHEST FINDINGS Cardiovascular: Cardiomegaly. No pericardial effusion. Reflux of contrast into the IVC and hepatic veins compatible with elevated right heart pressures. Negative for acute pulmonary embolism. Coronary artery and aortic atherosclerotic calcification. Mediastinum/Nodes: Trachea and esophagus are unremarkable. No thoracic adenopathy. Lungs/Pleura: Patchy bilateral ground-glass opacities with interlobular septal thickening. Diffuse bronchial wall thickening. Trace bilateral pleural effusions. No pneumothorax. Musculoskeletal: No acute fracture. Review of the MIP images confirms the above findings. CT ABDOMEN and PELVIS FINDINGS Hepatobiliary: Respiratory motion limits assessment of the upper abdomen. Questionable nodular hepatic contour which can be seen with cirrhosis. No definite gallbladder abnormality though motion limits assessment of the gallbladder wall. No definite biliary dilation. Pancreas: Unremarkable. Spleen: Unremarkable. Adrenals/Urinary Tract: Stable adrenal glands. Cortical geographic hypoattenuation in the kidneys could be due to chronic scarring or pyelonephritis. No urinary calculi or  hydronephrosis. Bladder wall thickening with perivesical stranding. Stomach/Bowel: Normal caliber large and small bowel. Stool ball in the rectum. Stomach and appendix are within normal limits. Vascular/Lymphatic: Aortic atherosclerosis. No enlarged abdominal or pelvic lymph nodes. Reproductive: Unremarkable. Other: Mesenteric and body wall edema. Small volume abdominopelvic ascites. No free intraperitoneal air. Musculoskeletal: No acute fracture. Review of the MIP images confirms the above findings. IMPRESSION: 1. Negative for acute pulmonary embolism. 2. Cardiomegaly with elevated right heart pressures. 3. Ground-glass opacities and interlobular septal thickening with trace bilateral pleural effusions. Prominent differential considerations are pulmonary edema and atypical pneumonia. 4. Anasarca with small volume abdominopelvic ascites. 5. Bladder wall thickening with perivesical stranding. Correlate with urinalysis to exclude cystitis. 6. Cortical geographic hypoattenuation in the kidneys could be due to chronic scarring or pyelonephritis. 7. Questionable nodular hepatic contour which can be seen with cirrhosis. Aortic Atherosclerosis (ICD10-I70.0). Electronically Signed   By: Minerva Fester M.D.   On: 07/31/2023 02:04   CT ABDOMEN PELVIS W CONTRAST Result Date: 07/31/2023 CLINICAL DATA:  Patient found down by family. Altered mental status since 12/31. Unsteady on feet. Blunt abdominal trauma. Abdominal pain. EXAM: CT ANGIOGRAPHY CHEST CT ABDOMEN AND PELVIS WITH CONTRAST TECHNIQUE: Multidetector CT imaging of the chest was performed using the standard protocol during bolus administration of intravenous contrast. Multiplanar CT image reconstructions and MIPs were obtained to evaluate the vascular anatomy. Multidetector CT imaging of the abdomen and pelvis was performed using the standard protocol during bolus administration of intravenous contrast. RADIATION DOSE REDUCTION: This exam was performed according to  the departmental dose-optimization program which includes automated exposure control, adjustment of the mA and/or kV according to patient size and/or use of iterative reconstruction technique. CONTRAST:  75mL OMNIPAQUE IOHEXOL 350 MG/ML SOLN COMPARISON:  Ultrasound 07/30/2023; chest radiograph 07/30/2023; CTA chest 11/08/2021; CT abdomen and pelvis 09/21/2015 FINDINGS: CTA CHEST FINDINGS Cardiovascular: Cardiomegaly. No pericardial effusion.  Reflux of contrast into the IVC and hepatic veins compatible with elevated right heart pressures. Negative for acute pulmonary embolism. Coronary artery and aortic atherosclerotic calcification. Mediastinum/Nodes: Trachea and esophagus are unremarkable. No thoracic adenopathy. Lungs/Pleura: Patchy bilateral ground-glass opacities with interlobular septal thickening. Diffuse bronchial wall thickening. Trace bilateral pleural effusions. No pneumothorax. Musculoskeletal: No acute fracture. Review of the MIP images confirms the above findings. CT ABDOMEN and PELVIS FINDINGS Hepatobiliary: Respiratory motion limits assessment of the upper abdomen. Questionable nodular hepatic contour which can be seen with cirrhosis. No definite gallbladder abnormality though motion limits assessment of the gallbladder wall. No definite biliary dilation. Pancreas: Unremarkable. Spleen: Unremarkable. Adrenals/Urinary Tract: Stable adrenal glands. Cortical geographic hypoattenuation in the kidneys could be due to chronic scarring or pyelonephritis. No urinary calculi or hydronephrosis. Bladder wall thickening with perivesical stranding. Stomach/Bowel: Normal caliber large and small bowel. Stool ball in the rectum. Stomach and appendix are within normal limits. Vascular/Lymphatic: Aortic atherosclerosis. No enlarged abdominal or pelvic lymph nodes. Reproductive: Unremarkable. Other: Mesenteric and body wall edema. Small volume abdominopelvic ascites. No free intraperitoneal air. Musculoskeletal: No  acute fracture. Review of the MIP images confirms the above findings. IMPRESSION: 1. Negative for acute pulmonary embolism. 2. Cardiomegaly with elevated right heart pressures. 3. Ground-glass opacities and interlobular septal thickening with trace bilateral pleural effusions. Prominent differential considerations are pulmonary edema and atypical pneumonia. 4. Anasarca with small volume abdominopelvic ascites. 5. Bladder wall thickening with perivesical stranding. Correlate with urinalysis to exclude cystitis. 6. Cortical geographic hypoattenuation in the kidneys could be due to chronic scarring or pyelonephritis. 7. Questionable nodular hepatic contour which can be seen with cirrhosis. Aortic Atherosclerosis (ICD10-I70.0). Electronically Signed   By: Minerva Fester M.D.   On: 07/31/2023 02:04   US Abdomen Limited RUQ (LIVER/GB) Result Date: 07/30/2023 CLINICAL DATA:  Transaminitis EXAM: ULTRASOUND ABDOMEN LIMITED RIGHT UPPER QUADRANT COMPARISON:  CT 09/21/2015 FINDINGS: Gallbladder: Sludge filled gallbladder. Increased wall thickness at 4.9 mm. Negative sonographic Murphy. Trace pericholecystic fluid Common bile duct: Diameter: 4.7 mm Liver: No focal hepatic abnormality. Echogenicity within normal limits. Trace perihepatic ascites. Questionable surface nodularity of the liver portal vein is patent on color Doppler imaging with normal direction of blood flow towards the liver. Other: Trace perihepatic ascites IMPRESSION: 1. Sludge filled gallbladder with increased wall thickness and trace pericholecystic fluid but negative sonographic Murphy. If acute gallbladder disease is a concern, correlation with nuclear medicine hepatobiliary imaging could be obtained. 2. Trace perihepatic ascites. Questionable surface nodularity of the liver, correlate for cirrhosis risk factors. Electronically Signed   By: Jasmine Pang M.D.   On: 07/30/2023 22:31   CT Head Wo Contrast Result Date: 07/30/2023 CLINICAL DATA:  Mental  status change EXAM: CT HEAD WITHOUT CONTRAST TECHNIQUE: Contiguous axial images were obtained from the base of the skull through the vertex without intravenous contrast. RADIATION DOSE REDUCTION: This exam was performed according to the departmental dose-optimization program which includes automated exposure control, adjustment of the mA and/or kV according to patient size and/or use of iterative reconstruction technique. COMPARISON:  CT brain 06/18/2022 trauma MRI 08/26/2021 FINDINGS: Brain: Motion degradation limits the exam. No gross hemorrhage or intracranial mass. Atrophy and chronic small vessel ischemic changes of the white matter. Chronic right parietal infarct. Interval right posterior frontal infarct, series 3, image 25, suspect chronic but new from prior head CT. Interval hypodensity at the right posterior temporal and parietal lobes consistent with infarct, probably chronic but limited by motion degradation and more extensive when compared to the  CT from November of 2023. Ventricles are nonenlarged. Vascular: No hyperdense vessels.  Carotid vascular calcification Skull: Normal. Negative for fracture or focal lesion. Sinuses/Orbits: Moderate mucosal thickening within the paranasal sinuses. Other: None IMPRESSION: 1. Significantly limited by motion degradation. No obvious hemorrhage, mass or mass effect. 2. Atrophy and chronic small vessel ischemic changes of the white matter 3. Prior chronic right parietal infarct. Interval right posterior frontal and right temporoparietal areas of suspected infarction, suspect chronic but new as compared with head CT from 2023 and assessment is limited by significant motion degradation. Electronically Signed   By: Jasmine Pang M.D.   On: 07/30/2023 20:59   DG Chest Port 1 View Result Date: 07/30/2023 CLINICAL DATA:  Altered mental status EXAM: PORTABLE CHEST 1 VIEW COMPARISON:  Chest x-ray 11/13/2022 FINDINGS: The heart is enlarged, unchanged. There is no pleural  effusion or pneumothorax. Both lungs are clear. The visualized skeletal structures are unremarkable. IMPRESSION: 1. No active disease. 2. Cardiomegaly. Electronically Signed   By: Darliss Cheney M.D.   On: 07/30/2023 19:29       The results of significant diagnostics from this hospitalization (including imaging, microbiology, ancillary and laboratory) are listed below for reference.     Microbiology: Recent Results (from the past 240 hours)  Blood Culture (routine x 2)     Status: None   Collection Time: 08/15/23  6:31 AM   Specimen: BLOOD RIGHT WRIST  Result Value Ref Range Status   Specimen Description BLOOD RIGHT WRIST  Final   Special Requests   Final    BOTTLES DRAWN AEROBIC AND ANAEROBIC Blood Culture results may not be optimal due to an inadequate volume of blood received in culture bottles   Culture   Final    NO GROWTH 5 DAYS Performed at [facility], [address], [place], [place] [address]    Report Status 08/20/2023 FINAL  Final  Blood Culture (routine x 2)     Status: None   Collection Time: 08/15/23  6:36 AM   Specimen: BLOOD RIGHT HAND  Result Value Ref Range Status   Specimen Description BLOOD RIGHT HAND  Final   Special Requests   Final    BOTTLES DRAWN AEROBIC ONLY Blood Culture results may not be optimal due to an inadequate volume of blood received in culture bottles   Culture   Final    NO GROWTH 5 DAYS Performed at [facility], [address], [place], [place] [address]    Report Status 08/20/2023 FINAL  Final  Resp panel by RT-PCR (RSV, Flu A&B, Covid) Anterior Nasal Swab     Status: None   Collection Time: 08/15/23 10:01 AM   Specimen: Anterior Nasal Swab  Result Value Ref Range Status   SARS Coronavirus 2 by RT PCR NEGATIVE NEGATIVE Final   Influenza A by PCR NEGATIVE NEGATIVE Final   Influenza B by PCR NEGATIVE NEGATIVE Final    Comment: (NOTE) The Xpert Xpress SARS-CoV-2/FLU/RSV plus assay is intended as an aid in the diagnosis of  influenza from Nasopharyngeal swab specimens and should not be used as a sole basis for treatment. Nasal washings and aspirates are unacceptable for Xpert Xpress SARS-CoV-2/FLU/RSV testing.  Fact Sheet for Patients: BloggerCourse.com  Fact Sheet for Healthcare Providers: SeriousBroker.it  This test is not yet approved or cleared by the [place] FDA and has been authorized for detection and/or diagnosis of SARS-CoV-2 by FDA under an Emergency Use Authorization (EUA). This EUA will remain in effect (  meaning this test can be used) for the duration of the COVID-19 declaration under Section 564(b)(1) of the Act, 21 U.S.C. section 360bbb-3(b)(1), unless the authorization is terminated or revoked.     Resp Syncytial Virus by PCR NEGATIVE NEGATIVE Final    Comment: (NOTE) Fact Sheet for Patients: BloggerCourse.com  Fact Sheet for Healthcare Providers: SeriousBroker.it  This test is not yet approved or cleared by the [place] FDA and has been authorized for detection and/or diagnosis of SARS-CoV-2 by FDA under an Emergency Use Authorization (EUA). This EUA will remain in effect (meaning this test can be used) for the duration of the COVID-19 declaration under Section 564(b)(1) of the Act, 21 U.S.C. section 360bbb-3(b)(1), unless the authorization is terminated or revoked.  Performed at [facility], [address] [place], [place] [address]      Labs:  CBC: No results for input(s): "WBC", "NEUTROABS", "HGB", "HCT", "MCV", "PLT" in the last 168 hours. BMP &GFR No results for input(s): "NA", "K", "CL", "CO2", "GLUCOSE", "BUN", "CREATININE", "CALCIUM", "MG", "PHOS" in the last 168 hours.  Invalid input(s): "GFRCG" Estimated Creatinine Clearance: 34 mL/min (A) (by C-G formula based on SCr of 1.54 mg/dL (H)). Liver & Pancreas: No results for input(s): "AST", "ALT",  "ALKPHOS", "BILITOT", "PROT", "ALBUMIN" in the last 168 hours. No results for input(s): "LIPASE", "AMYLASE" in the last 168 hours. No results for input(s): "AMMONIA" in the last 168 hours. Diabetic: No results for input(s): "HGBA1C" in the last 72 hours. No results for input(s): "GLUCAP" in the last 168 hours. Cardiac Enzymes: No results for input(s): "CKTOTAL", "CKMB", "CKMBINDEX", "TROPONINI" in the last 168 hours. No results for input(s): "PROBNP" in the last 8760 hours. Coagulation Profile: No results for input(s): "INR", "PROTIME" in the last 168 hours. Thyroid Function Tests: No results for input(s): "TSH", "T4TOTAL", "FREET4", "T3FREE", "THYROIDAB" in the last 72 hours. Lipid Profile: No results for input(s): "CHOL", "HDL", "LDLCALC", "TRIG", "CHOLHDL", "LDLDIRECT" in the last 72 hours. Anemia Panel: No results for input(s): "VITAMINB12", "FOLATE", "FERRITIN", "TIBC", "IRON", "RETICCTPCT" in the last 72 hours. Urine analysis:    Component Value Date/Time   COLORURINE AMBER (A) 08/15/2023 0720   APPEARANCEUR CLEAR 08/15/2023 0720   LABSPEC 1.014 08/15/2023 0720   PHURINE 5.0 08/15/2023 0720   GLUCOSEU NEGATIVE 08/15/2023 0720   HGBUR MODERATE (A) 08/15/2023 0720   BILIRUBINUR NEGATIVE 08/15/2023 0720   KETONESUR NEGATIVE 08/15/2023 0720   PROTEINUR 30 (A) 08/15/2023 0720   UROBILINOGEN 0.2 06/22/2009 0723   NITRITE NEGATIVE 08/15/2023 0720   LEUKOCYTESUR NEGATIVE 08/15/2023 0720   Sepsis Labs: Invalid input(s): "PROCALCITONIN", "LACTICIDVEN"   SIGNED:  Almon Hercules, MD  [facility] 08/22/2023, 11:21 AM

## 2023-08-22 NOTE — TOC Progression Note (Addendum)
Transition of Care [facility]) - Progression Note    Patient Details  Name: Lucas Fuller MRN: [id number] Date of Birth: [birth year]  Transition of Care [facility]) CM/SW Contact  Marliss Coots, LCSW Phone Number: 08/22/2023, 8:21 AM  Clinical Narrative:     8:21 AM Per [facility], patient is able to discharge upon bed availability this afternoon. CSW attempted to call patient's niece (patient disoriented x3), Steward Drone, to inform her of patient discharge today and inquire about transportation for discharge. There was no response and a voicemail was left.  10:26 AM CSW called patient's niece, Steward Drone, to inform her of patient's return to [facility] today and that physical therapy recommended ambulance transportation for discharge. Steward Drone expressed understanding of the information. CSW informed Steward Drone of a possible copay to be billed upon service, which Steward Drone also expressed understanding of.  Expected Discharge Plan: Skilled Nursing Facility Barriers to Discharge: Continued Medical Work up, English as a second language teacher  Expected Discharge Plan and Services In-house Referral: Clinical Social Work, Hospice / Palliative Care     Living arrangements for the past 2 months: Single Family Home, Skilled Nursing Facility Expected Discharge Date: 08/22/23                                     Social Determinants of Health (SDOH) Interventions SDOH Screenings   Food Insecurity: Patient Unable To Answer (08/16/2023)  Housing: Patient Unable To Answer (08/16/2023)  Transportation Needs: Patient Unable To Answer (08/16/2023)  Utilities: Patient Unable To Answer (08/16/2023)  Alcohol Screen: Low Risk  (11/10/2021)  Social Connections: Patient Unable To Answer (08/16/2023)  Tobacco Use: Low Risk  (08/15/2023)    Readmission Risk Interventions     No data to display

## 2023-08-23 DIAGNOSIS — Z515 Encounter for palliative care: Secondary | ICD-10-CM | POA: Diagnosis not present

## 2023-08-23 DIAGNOSIS — R531 Weakness: Secondary | ICD-10-CM | POA: Diagnosis not present

## 2023-08-23 DIAGNOSIS — K117 Disturbances of salivary secretion: Secondary | ICD-10-CM | POA: Diagnosis not present

## 2023-08-26 DIAGNOSIS — R040 Epistaxis: Secondary | ICD-10-CM | POA: Diagnosis not present

## 2023-08-26 DIAGNOSIS — L89616 Pressure-induced deep tissue damage of right heel: Secondary | ICD-10-CM | POA: Diagnosis not present

## 2023-08-26 DIAGNOSIS — L89153 Pressure ulcer of sacral region, stage 3: Secondary | ICD-10-CM | POA: Diagnosis not present

## 2023-08-26 DIAGNOSIS — L89316 Pressure-induced deep tissue damage of right buttock: Secondary | ICD-10-CM | POA: Diagnosis not present

## 2023-08-26 DIAGNOSIS — R627 Adult failure to thrive: Secondary | ICD-10-CM | POA: Diagnosis not present

## 2023-08-26 DIAGNOSIS — Z515 Encounter for palliative care: Secondary | ICD-10-CM | POA: Diagnosis not present

## 2023-08-26 DIAGNOSIS — L89313 Pressure ulcer of right buttock, stage 3: Secondary | ICD-10-CM | POA: Diagnosis not present

## 2023-08-27 DIAGNOSIS — R627 Adult failure to thrive: Secondary | ICD-10-CM | POA: Diagnosis not present

## 2023-08-27 DIAGNOSIS — R040 Epistaxis: Secondary | ICD-10-CM | POA: Diagnosis not present

## 2023-08-28 DIAGNOSIS — M6282 Rhabdomyolysis: Secondary | ICD-10-CM | POA: Diagnosis not present

## 2023-08-28 DIAGNOSIS — R570 Cardiogenic shock: Secondary | ICD-10-CM | POA: Diagnosis not present

## 2023-08-28 DIAGNOSIS — R627 Adult failure to thrive: Secondary | ICD-10-CM | POA: Diagnosis not present

## 2023-08-28 DIAGNOSIS — N179 Acute kidney failure, unspecified: Secondary | ICD-10-CM | POA: Diagnosis not present

## 2023-08-29 DIAGNOSIS — R638 Other symptoms and signs concerning food and fluid intake: Secondary | ICD-10-CM | POA: Diagnosis not present

## 2023-08-29 DIAGNOSIS — N179 Acute kidney failure, unspecified: Secondary | ICD-10-CM | POA: Diagnosis not present

## 2023-08-29 DIAGNOSIS — R627 Adult failure to thrive: Secondary | ICD-10-CM | POA: Diagnosis not present

## 2023-09-02 DIAGNOSIS — L89313 Pressure ulcer of right buttock, stage 3: Secondary | ICD-10-CM | POA: Diagnosis not present

## 2023-09-02 DIAGNOSIS — L89153 Pressure ulcer of sacral region, stage 3: Secondary | ICD-10-CM | POA: Diagnosis not present

## 2023-09-02 DIAGNOSIS — W19XXXA Unspecified fall, initial encounter: Secondary | ICD-10-CM | POA: Diagnosis not present

## 2023-09-02 DIAGNOSIS — R627 Adult failure to thrive: Secondary | ICD-10-CM | POA: Diagnosis not present

## 2023-09-02 DIAGNOSIS — L89316 Pressure-induced deep tissue damage of right buttock: Secondary | ICD-10-CM | POA: Diagnosis not present

## 2023-09-02 DIAGNOSIS — L89616 Pressure-induced deep tissue damage of right heel: Secondary | ICD-10-CM | POA: Diagnosis not present

## 2023-09-05 DIAGNOSIS — R627 Adult failure to thrive: Secondary | ICD-10-CM | POA: Diagnosis not present

## 2023-09-21 DEATH — deceased
